# Patient Record
Sex: Female | Born: 1935 | Race: White | Hispanic: No | Smoking: Former smoker
Health system: Southern US, Community
[De-identification: ages and names within clinical notes are randomized; demographics above are authoritative.]

## PROBLEM LIST (undated history)

## (undated) DIAGNOSIS — E039 Hypothyroidism, unspecified: Secondary | ICD-10-CM

## (undated) DIAGNOSIS — H269 Unspecified cataract: Secondary | ICD-10-CM

## (undated) DIAGNOSIS — Z8719 Personal history of other diseases of the digestive system: Secondary | ICD-10-CM

## (undated) DIAGNOSIS — R0600 Dyspnea, unspecified: Secondary | ICD-10-CM

## (undated) DIAGNOSIS — K219 Gastro-esophageal reflux disease without esophagitis: Secondary | ICD-10-CM

## (undated) DIAGNOSIS — I10 Essential (primary) hypertension: Secondary | ICD-10-CM

## (undated) DIAGNOSIS — J342 Deviated nasal septum: Secondary | ICD-10-CM

## (undated) DIAGNOSIS — J449 Chronic obstructive pulmonary disease, unspecified: Secondary | ICD-10-CM

## (undated) DIAGNOSIS — R011 Cardiac murmur, unspecified: Secondary | ICD-10-CM

## (undated) DIAGNOSIS — D219 Benign neoplasm of connective and other soft tissue, unspecified: Secondary | ICD-10-CM

## (undated) DIAGNOSIS — D649 Anemia, unspecified: Secondary | ICD-10-CM

## (undated) DIAGNOSIS — I2510 Atherosclerotic heart disease of native coronary artery without angina pectoris: Secondary | ICD-10-CM

## (undated) DIAGNOSIS — F419 Anxiety disorder, unspecified: Secondary | ICD-10-CM

## (undated) DIAGNOSIS — N189 Chronic kidney disease, unspecified: Secondary | ICD-10-CM

## (undated) DIAGNOSIS — K222 Esophageal obstruction: Secondary | ICD-10-CM

## (undated) DIAGNOSIS — F319 Bipolar disorder, unspecified: Secondary | ICD-10-CM

## (undated) DIAGNOSIS — K2210 Ulcer of esophagus without bleeding: Secondary | ICD-10-CM

## (undated) DIAGNOSIS — I059 Rheumatic mitral valve disease, unspecified: Secondary | ICD-10-CM

## (undated) DIAGNOSIS — J302 Other seasonal allergic rhinitis: Secondary | ICD-10-CM

## (undated) DIAGNOSIS — M1990 Unspecified osteoarthritis, unspecified site: Secondary | ICD-10-CM

## (undated) DIAGNOSIS — E7800 Pure hypercholesterolemia, unspecified: Secondary | ICD-10-CM

## (undated) DIAGNOSIS — N251 Nephrogenic diabetes insipidus: Secondary | ICD-10-CM

## (undated) DIAGNOSIS — N281 Cyst of kidney, acquired: Secondary | ICD-10-CM

## (undated) HISTORY — PX: NOSE SURGERY: SHX723

## (undated) HISTORY — PX: ESOPHAGEAL DILATION: SHX303

## (undated) HISTORY — PX: THYROIDECTOMY: SHX17

## (undated) HISTORY — PX: INFECTED SKIN DEBRIDEMENT: SHX678

## (undated) HISTORY — DX: Chronic kidney disease, unspecified: N18.9

## (undated) HISTORY — DX: Nephrogenic diabetes insipidus: N25.1

## (undated) HISTORY — PX: CARDIAC CATHETERIZATION: SHX172

## (undated) HISTORY — PX: JOINT REPLACEMENT: SHX530

## (undated) HISTORY — PX: CHOLECYSTECTOMY: SHX55

## (undated) HISTORY — PX: OTHER SURGICAL HISTORY: SHX169

## (undated) HISTORY — DX: Deviated nasal septum: J34.2

## (undated) HISTORY — PX: APPENDECTOMY: SHX54

## (undated) HISTORY — DX: Cyst of kidney, acquired: N28.1

## (undated) HISTORY — PX: TOTAL THYROIDECTOMY: SHX2547

## (undated) NOTE — *Deleted (*Deleted)
TREATMENT   Ther-ex  NuStep L2 x 5 minutes for warm-up (unbilled); Sit to stand without upper extremity support x10; Seated clams with manual resistance from therapist; Seated adductor squeezes with manual resistance from therapist;   Neuromuscular Re-education  Performed BERG (45/56) and TUG (18.0s) with patient; VOR x 1 horizontal in sitting 3 x 60s (4/10 dizziness first rep but no dizziness during subsequent reps); NBOS eyes open/close x 30s each; NBOS horizontal and vertical head turns x 30s each; NBOS head with shoulder turns (turn from waist) x 30s; Airex alternating 6" step taps x 10 BLE; Airex semitandem balance with front foot on 6" step and rear foot on Airex pad x 30s each; Airex NBOS head with shoulder turns (turn from waist) x 30s;   Pt educated throughout session about proper posture and technique with exercises. Improved exercise technique, movement at target joints, use of target muscles after min to mod verbal, visual, tactile cues.    Patient demonstrates excellent motivation during session.  Performed BERG with patient as well as TUG and pt scored 45/56 and 18.0s respectively indicating increased fall risk.  Initiated VOR x1 horizontal with patient however she struggles to perform correctly and complains of of increase in neck pain during exercise.  She struggles with balance on unstable Airex pad.  Deferred initiation of HEP until next session.  Patient encouraged to follow-up as scheduled. Pt will benefit from PT services to address deficits in strength, balance, and mobility in order to return to full function at home.

## (undated) NOTE — *Deleted (*Deleted)
TREATMENT   Ther-ex  NuStep L2 x 5 minutes for warm-up (unbilled); Sit to stand without upper extremity support x10; Seated clams with manual resistance from therapist; Seated adductor squeezes with manual resistance from therapist;   Neuromuscular Re-education  Performed BERG (45/56) and TUG (18.0s) with patient; VOR x 1 horizontal in sitting 3 x 60s (4/10 dizziness first rep but no dizziness during subsequent reps); NBOS eyes open/close x 30s each; NBOS horizontal and vertical head turns x 30s each; NBOS head with shoulder turns (turn from waist) x 30s; Airex alternating 6" step taps x 10 BLE; Airex semitandem balance with front foot on 6" step and rear foot on Airex pad x 30s each; Airex NBOS head with shoulder turns (turn from waist) x 30s;   Pt educated throughout session about proper posture and technique with exercises. Improved exercise technique, movement at target joints, use of target muscles after min to mod verbal, visual, tactile cues.    Patient demonstrates excellent motivation during session.  Performed BERG with patient as well as TUG and pt scored 45/56 and 18.0s respectively indicating increased fall risk.  Initiated VOR x1 horizontal with patient however she struggles to perform correctly and complains of of increase in neck pain during exercise.  She struggles with balance on unstable Airex pad.  Deferred initiation of HEP until next session.  Patient encouraged to follow-up as scheduled. Pt will benefit from PT services to address deficits in strength, balance, and mobility in order to return to full function at home.    

---

## 2004-10-15 ENCOUNTER — Ambulatory Visit: Payer: Self-pay

## 2004-10-22 ENCOUNTER — Ambulatory Visit: Payer: Self-pay | Admitting: Family Medicine

## 2005-10-17 ENCOUNTER — Emergency Department: Payer: Self-pay | Admitting: Emergency Medicine

## 2006-03-18 ENCOUNTER — Ambulatory Visit: Payer: Self-pay | Admitting: Family Medicine

## 2006-04-07 ENCOUNTER — Ambulatory Visit: Payer: Self-pay | Admitting: Gastroenterology

## 2006-09-14 ENCOUNTER — Other Ambulatory Visit: Payer: Self-pay

## 2006-09-14 ENCOUNTER — Ambulatory Visit: Payer: Self-pay | Admitting: General Practice

## 2006-09-30 ENCOUNTER — Inpatient Hospital Stay: Payer: Self-pay | Admitting: General Practice

## 2006-10-06 ENCOUNTER — Encounter: Payer: Self-pay | Admitting: Internal Medicine

## 2006-10-21 ENCOUNTER — Emergency Department: Payer: Self-pay | Admitting: Unknown Physician Specialty

## 2006-10-21 ENCOUNTER — Other Ambulatory Visit: Payer: Self-pay

## 2006-11-16 ENCOUNTER — Other Ambulatory Visit: Payer: Self-pay

## 2006-11-16 ENCOUNTER — Emergency Department: Payer: Self-pay | Admitting: Emergency Medicine

## 2007-03-30 ENCOUNTER — Ambulatory Visit: Payer: Self-pay | Admitting: Family Medicine

## 2008-07-15 ENCOUNTER — Emergency Department: Payer: Self-pay | Admitting: Emergency Medicine

## 2008-08-25 ENCOUNTER — Ambulatory Visit: Payer: Self-pay | Admitting: Internal Medicine

## 2008-08-28 ENCOUNTER — Ambulatory Visit: Payer: Self-pay | Admitting: Internal Medicine

## 2008-09-27 ENCOUNTER — Inpatient Hospital Stay: Payer: Self-pay | Admitting: Psychiatry

## 2008-11-13 ENCOUNTER — Ambulatory Visit: Payer: Self-pay | Admitting: Otolaryngology

## 2009-01-20 ENCOUNTER — Inpatient Hospital Stay: Payer: Self-pay | Admitting: Specialist

## 2009-02-03 ENCOUNTER — Inpatient Hospital Stay: Payer: Self-pay | Admitting: Unknown Physician Specialty

## 2010-01-28 ENCOUNTER — Ambulatory Visit: Payer: Self-pay | Admitting: Gastroenterology

## 2010-05-10 ENCOUNTER — Ambulatory Visit: Payer: Self-pay | Admitting: Nephrology

## 2010-09-12 ENCOUNTER — Ambulatory Visit: Payer: Self-pay | Admitting: Nephrology

## 2010-11-13 ENCOUNTER — Emergency Department: Payer: Self-pay | Admitting: Emergency Medicine

## 2011-04-29 ENCOUNTER — Ambulatory Visit: Payer: Self-pay | Admitting: Urology

## 2012-06-18 DIAGNOSIS — N281 Cyst of kidney, acquired: Secondary | ICD-10-CM | POA: Insufficient documentation

## 2012-08-13 ENCOUNTER — Ambulatory Visit: Payer: Self-pay | Admitting: Nephrology

## 2012-11-02 ENCOUNTER — Ambulatory Visit: Payer: Self-pay | Admitting: Urology

## 2013-01-20 ENCOUNTER — Ambulatory Visit: Payer: Self-pay | Admitting: Gastroenterology

## 2013-04-26 ENCOUNTER — Ambulatory Visit: Payer: Self-pay | Admitting: Ophthalmology

## 2013-08-03 ENCOUNTER — Other Ambulatory Visit: Payer: Self-pay | Admitting: Family Medicine

## 2013-08-04 ENCOUNTER — Inpatient Hospital Stay: Payer: Self-pay | Admitting: Family Medicine

## 2013-08-04 LAB — CBC WITH DIFFERENTIAL/PLATELET
Basophil #: 0 10*3/uL (ref 0.0–0.1)
Basophil %: 0.4 %
EOS ABS: 0 10*3/uL (ref 0.0–0.7)
Eosinophil %: 0.2 %
HCT: 28.5 % — ABNORMAL LOW (ref 35.0–47.0)
HGB: 9.3 g/dL — ABNORMAL LOW (ref 12.0–16.0)
Lymphocyte #: 1.4 10*3/uL (ref 1.0–3.6)
Lymphocyte %: 11.6 %
MCH: 25.1 pg — AB (ref 26.0–34.0)
MCHC: 32.8 g/dL (ref 32.0–36.0)
MCV: 77 fL — ABNORMAL LOW (ref 80–100)
Monocyte #: 2 x10 3/mm — ABNORMAL HIGH (ref 0.2–0.9)
Monocyte %: 16.5 %
Neutrophil #: 8.7 10*3/uL — ABNORMAL HIGH (ref 1.4–6.5)
Neutrophil %: 71.3 %
PLATELETS: 177 10*3/uL (ref 150–440)
RBC: 3.72 10*6/uL — AB (ref 3.80–5.20)
RDW: 14.8 % — AB (ref 11.5–14.5)
WBC: 12.3 10*3/uL — ABNORMAL HIGH (ref 3.6–11.0)

## 2013-08-04 LAB — URINALYSIS, COMPLETE
Bilirubin,UR: NEGATIVE
GLUCOSE, UR: NEGATIVE mg/dL (ref 0–75)
Ketone: NEGATIVE
Nitrite: POSITIVE
PROTEIN: NEGATIVE
Ph: 5 (ref 4.5–8.0)
Specific Gravity: 1.008 (ref 1.003–1.030)
Squamous Epithelial: 1

## 2013-08-04 LAB — COMPREHENSIVE METABOLIC PANEL
ALT: 9 U/L — AB (ref 12–78)
ANION GAP: 8 (ref 7–16)
Albumin: 2.7 g/dL — ABNORMAL LOW (ref 3.4–5.0)
Alkaline Phosphatase: 65 U/L
BUN: 15 mg/dL (ref 7–18)
Bilirubin,Total: 0.4 mg/dL (ref 0.2–1.0)
CHLORIDE: 107 mmol/L (ref 98–107)
CO2: 21 mmol/L (ref 21–32)
Calcium, Total: 8.3 mg/dL — ABNORMAL LOW (ref 8.5–10.1)
Creatinine: 1.98 mg/dL — ABNORMAL HIGH (ref 0.60–1.30)
EGFR (African American): 28 — ABNORMAL LOW
EGFR (Non-African Amer.): 24 — ABNORMAL LOW
GLUCOSE: 98 mg/dL (ref 65–99)
Osmolality: 273 (ref 275–301)
Potassium: 3.7 mmol/L (ref 3.5–5.1)
SGOT(AST): 11 U/L — ABNORMAL LOW (ref 15–37)
Sodium: 136 mmol/L (ref 136–145)
Total Protein: 6.2 g/dL — ABNORMAL LOW (ref 6.4–8.2)

## 2013-08-04 LAB — LIPASE, BLOOD: Lipase: 158 U/L (ref 73–393)

## 2013-08-04 LAB — TROPONIN I: Troponin-I: <0.02 ng/mL

## 2013-08-04 LAB — PRO B NATRIURETIC PEPTIDE: B-Type Natriuretic Peptide: 3495 pg/mL — ABNORMAL HIGH (ref 0–450)

## 2013-08-05 LAB — BASIC METABOLIC PANEL
Anion Gap: 8 (ref 7–16)
BUN: 16 mg/dL (ref 7–18)
CALCIUM: 8.3 mg/dL — AB (ref 8.5–10.1)
CREATININE: 1.84 mg/dL — AB (ref 0.60–1.30)
Chloride: 110 mmol/L — ABNORMAL HIGH (ref 98–107)
Co2: 22 mmol/L (ref 21–32)
EGFR (Non-African Amer.): 26 — ABNORMAL LOW
GFR CALC AF AMER: 30 — AB
Glucose: 87 mg/dL (ref 65–99)
Osmolality: 280 (ref 275–301)
POTASSIUM: 3.4 mmol/L — AB (ref 3.5–5.1)
Sodium: 140 mmol/L (ref 136–145)

## 2013-08-05 LAB — CBC WITH DIFFERENTIAL/PLATELET
BASOS PCT: 0.5 %
Basophil #: 0 10*3/uL (ref 0.0–0.1)
Eosinophil #: 0.1 10*3/uL (ref 0.0–0.7)
Eosinophil %: 0.6 %
HCT: 26.4 % — ABNORMAL LOW (ref 35.0–47.0)
HGB: 9 g/dL — ABNORMAL LOW (ref 12.0–16.0)
LYMPHS ABS: 1.3 10*3/uL (ref 1.0–3.6)
LYMPHS PCT: 13.2 %
MCH: 26.2 pg (ref 26.0–34.0)
MCHC: 34.3 g/dL (ref 32.0–36.0)
MCV: 76 fL — AB (ref 80–100)
MONO ABS: 1.5 x10 3/mm — AB (ref 0.2–0.9)
Monocyte %: 15.6 %
NEUTROS ABS: 6.7 10*3/uL — AB (ref 1.4–6.5)
Neutrophil %: 70.1 %
PLATELETS: 148 10*3/uL — AB (ref 150–440)
RBC: 3.46 10*6/uL — ABNORMAL LOW (ref 3.80–5.20)
RDW: 15.1 % — ABNORMAL HIGH (ref 11.5–14.5)
WBC: 9.6 10*3/uL (ref 3.6–11.0)

## 2013-08-05 LAB — MAGNESIUM: Magnesium: 1.6 mg/dL — ABNORMAL LOW

## 2013-08-06 LAB — CBC WITH DIFFERENTIAL/PLATELET
Basophil #: 0 10*3/uL (ref 0.0–0.1)
Basophil %: 0.6 %
Eosinophil #: 0.1 10*3/uL (ref 0.0–0.7)
Eosinophil %: 1.8 %
HCT: 25.6 % — ABNORMAL LOW (ref 35.0–47.0)
HGB: 8.4 g/dL — ABNORMAL LOW (ref 12.0–16.0)
LYMPHS PCT: 22.4 %
Lymphocyte #: 1.3 10*3/uL (ref 1.0–3.6)
MCH: 25.3 pg — ABNORMAL LOW (ref 26.0–34.0)
MCHC: 33 g/dL (ref 32.0–36.0)
MCV: 77 fL — AB (ref 80–100)
MONO ABS: 0.7 x10 3/mm (ref 0.2–0.9)
Monocyte %: 12.3 %
Neutrophil #: 3.7 10*3/uL (ref 1.4–6.5)
Neutrophil %: 62.9 %
Platelet: 160 10*3/uL (ref 150–440)
RBC: 3.34 10*6/uL — ABNORMAL LOW (ref 3.80–5.20)
RDW: 15.2 % — ABNORMAL HIGH (ref 11.5–14.5)
WBC: 5.9 10*3/uL (ref 3.6–11.0)

## 2013-08-06 LAB — BASIC METABOLIC PANEL
Anion Gap: 5 — ABNORMAL LOW (ref 7–16)
BUN: 12 mg/dL (ref 7–18)
CALCIUM: 8.4 mg/dL — AB (ref 8.5–10.1)
CHLORIDE: 115 mmol/L — AB (ref 98–107)
Co2: 24 mmol/L (ref 21–32)
Creatinine: 1.52 mg/dL — ABNORMAL HIGH (ref 0.60–1.30)
GFR CALC AF AMER: 38 — AB
GFR CALC NON AF AMER: 33 — AB
Glucose: 89 mg/dL (ref 65–99)
Osmolality: 286 (ref 275–301)
POTASSIUM: 3.6 mmol/L (ref 3.5–5.1)
SODIUM: 144 mmol/L (ref 136–145)

## 2013-08-06 LAB — URINE CULTURE

## 2013-08-07 LAB — CBC WITH DIFFERENTIAL/PLATELET
BASOS ABS: 0 10*3/uL (ref 0.0–0.1)
BASOS PCT: 0.7 %
Eosinophil #: 0.2 10*3/uL (ref 0.0–0.7)
Eosinophil %: 3.6 %
HCT: 26 % — ABNORMAL LOW (ref 35.0–47.0)
HGB: 8.9 g/dL — AB (ref 12.0–16.0)
Lymphocyte #: 1.7 10*3/uL (ref 1.0–3.6)
Lymphocyte %: 30.1 %
MCH: 26.1 pg (ref 26.0–34.0)
MCHC: 34.2 g/dL (ref 32.0–36.0)
MCV: 76 fL — ABNORMAL LOW (ref 80–100)
MONOS PCT: 12.1 %
Monocyte #: 0.7 x10 3/mm (ref 0.2–0.9)
NEUTROS ABS: 2.9 10*3/uL (ref 1.4–6.5)
Neutrophil %: 53.5 %
PLATELETS: 202 10*3/uL (ref 150–440)
RBC: 3.41 10*6/uL — ABNORMAL LOW (ref 3.80–5.20)
RDW: 15.1 % — ABNORMAL HIGH (ref 11.5–14.5)
WBC: 5.5 10*3/uL (ref 3.6–11.0)

## 2013-08-07 LAB — BASIC METABOLIC PANEL
Anion Gap: 5 — ABNORMAL LOW (ref 7–16)
BUN: 8 mg/dL (ref 7–18)
CALCIUM: 8.4 mg/dL — AB (ref 8.5–10.1)
CO2: 22 mmol/L (ref 21–32)
CREATININE: 1.47 mg/dL — AB (ref 0.60–1.30)
Chloride: 117 mmol/L — ABNORMAL HIGH (ref 98–107)
EGFR (African American): 39 — ABNORMAL LOW
GFR CALC NON AF AMER: 34 — AB
Glucose: 83 mg/dL (ref 65–99)
OSMOLALITY: 284 (ref 275–301)
POTASSIUM: 3.8 mmol/L (ref 3.5–5.1)
SODIUM: 144 mmol/L (ref 136–145)

## 2013-08-08 LAB — CULTURE, BLOOD (SINGLE)

## 2013-08-09 LAB — BASIC METABOLIC PANEL
ANION GAP: 7 (ref 7–16)
BUN: 9 mg/dL (ref 7–18)
Calcium, Total: 8.5 mg/dL (ref 8.5–10.1)
Chloride: 114 mmol/L — ABNORMAL HIGH (ref 98–107)
Co2: 24 mmol/L (ref 21–32)
Creatinine: 1.36 mg/dL — ABNORMAL HIGH (ref 0.60–1.30)
EGFR (Non-African Amer.): 37 — ABNORMAL LOW
GFR CALC AF AMER: 43 — AB
GLUCOSE: 72 mg/dL (ref 65–99)
Osmolality: 286 (ref 275–301)
Potassium: 3.7 mmol/L (ref 3.5–5.1)
Sodium: 145 mmol/L (ref 136–145)

## 2013-08-09 LAB — CULTURE, BLOOD (SINGLE)

## 2013-12-22 ENCOUNTER — Ambulatory Visit: Payer: Self-pay | Admitting: Urology

## 2014-03-10 ENCOUNTER — Emergency Department: Payer: Self-pay | Admitting: Emergency Medicine

## 2014-03-10 LAB — CBC WITH DIFFERENTIAL/PLATELET
BASOS ABS: 0.1 10*3/uL (ref 0.0–0.1)
Basophil %: 1 %
Eosinophil #: 0.3 10*3/uL (ref 0.0–0.7)
Eosinophil %: 3.1 %
HCT: 39.9 % (ref 35.0–47.0)
HGB: 13 g/dL (ref 12.0–16.0)
LYMPHS ABS: 2.2 10*3/uL (ref 1.0–3.6)
Lymphocyte %: 25.8 %
MCH: 26.3 pg (ref 26.0–34.0)
MCHC: 32.7 g/dL (ref 32.0–36.0)
MCV: 80 fL (ref 80–100)
Monocyte #: 0.9 x10 3/mm (ref 0.2–0.9)
Monocyte %: 10.7 %
NEUTROS ABS: 5 10*3/uL (ref 1.4–6.5)
NEUTROS PCT: 59.4 %
Platelet: 184 10*3/uL (ref 150–440)
RBC: 4.96 10*6/uL (ref 3.80–5.20)
RDW: 14.5 % (ref 11.5–14.5)
WBC: 8.4 10*3/uL (ref 3.6–11.0)

## 2014-03-10 LAB — BASIC METABOLIC PANEL
Anion Gap: 5 — ABNORMAL LOW (ref 7–16)
BUN: 28 mg/dL — AB (ref 7–18)
CALCIUM: 9.9 mg/dL (ref 8.5–10.1)
Chloride: 107 mmol/L (ref 98–107)
Co2: 28 mmol/L (ref 21–32)
Creatinine: 1.57 mg/dL — ABNORMAL HIGH (ref 0.60–1.30)
GLUCOSE: 91 mg/dL (ref 65–99)
Osmolality: 284 (ref 275–301)
POTASSIUM: 4.2 mmol/L (ref 3.5–5.1)
Sodium: 140 mmol/L (ref 136–145)

## 2014-03-10 LAB — URINALYSIS, COMPLETE
BACTERIA: NONE SEEN
BILIRUBIN, UR: NEGATIVE
GLUCOSE, UR: NEGATIVE mg/dL (ref 0–75)
Ketone: NEGATIVE
NITRITE: NEGATIVE
PH: 5 (ref 4.5–8.0)
Protein: NEGATIVE
RBC,UR: NONE SEEN /HPF (ref 0–5)
Specific Gravity: 1.004 (ref 1.003–1.030)
Squamous Epithelial: <1

## 2014-03-10 LAB — TROPONIN I: Troponin-I: <0.02 ng/mL

## 2014-03-29 DIAGNOSIS — I10 Essential (primary) hypertension: Secondary | ICD-10-CM | POA: Insufficient documentation

## 2014-03-29 DIAGNOSIS — F319 Bipolar disorder, unspecified: Secondary | ICD-10-CM | POA: Insufficient documentation

## 2014-03-29 DIAGNOSIS — I340 Nonrheumatic mitral (valve) insufficiency: Secondary | ICD-10-CM | POA: Insufficient documentation

## 2014-03-29 DIAGNOSIS — E039 Hypothyroidism, unspecified: Secondary | ICD-10-CM | POA: Insufficient documentation

## 2014-03-29 DIAGNOSIS — R001 Bradycardia, unspecified: Secondary | ICD-10-CM | POA: Insufficient documentation

## 2014-07-21 ENCOUNTER — Emergency Department: Payer: Self-pay | Admitting: Emergency Medicine

## 2014-09-09 NOTE — H&P (Signed)
PATIENT NAME:  Stephanie Anthony, Stephanie Anthony MR#:  [id number] DATE OF BIRTH:  September 05, 1933  DATE OF ADMISSION:  08/04/2013  PRIMARY CARE PHYSICIAN: [name], MD REFERRING PHYSICIAN: Dr. Mariea Clonts  CHIEF COMPLAINT: Fever and chills, anorexia for 2 weeks.   HISTORY OF PRESENT ILLNESS: A 81 year old Caucasian female with a history of hypertension, anemia, MI, depression, who presented in the ED with above chief complaints. The patient is alert, awake, oriented, in no acute distress. The patient said that she has had a fever, chills, anorexia for the past 2 weeks. She has poor oral intake, nausea, and vomiting once. The patient has generalized weakness. She also complains of chest pain, a little coughing. She has dark urine with back pain and flank pain.   She was treated with Zithromax by PCP without relief. In the ED, urinalysis shows UTI, suspect pyelonephritis. In addition, the patient's blood pressure was low at 90s. Was given normal saline bolus and IV antibiotics.   PAST MEDICAL HISTORY: Hypertension, anemia, MI, depression, arthritis, heart murmur, sleep apnea.   PAST SURGICAL HISTORY: Right knee surgery, cholecystectomy, thyroidectomy.   SOCIAL HISTORY: No smoking or drinking or illicit drugs.   FAMILY HISTORY: Hypertension, diabetes, heart attack, stroke in her family.   ALLERGIES: COBAN, PREDNISONE, VALIUM, ANIMAL DANDER, DOGS, DUST, SMOKE.   HOME MEDICATIONS:  1.  Alprazolam 1 mg one tablet once a day.  2.  Cardizem ER 180 mg p.o. once a day.  3.  Enalapril 2.5 mg p.o. once a day.  4.  Lamotrigine 100 mg p.o. at bedtime.  5.  Levothyroxine 88 mcg p.o. at bedtime.  6.  Lovastatin 20 mg p.o. at bedtime.  7.  Vitamin B12 at 1000 mcg p.o. once a day.  8.  Vitamin D3 at 1000 international units p.o. tablets once a day.   REVIEW OF SYSTEMS:  CONSTITUTIONAL: Fever, chills, headache, dizziness and generalized weakness.  EYES: No double vision or blurred vision.  ENT: No postnasal drip, slurred  speech or dysphagia. CARDIOVASCULAR: Has chest pain by coughing. No palpitations, orthopnea, or nocturnal dyspnea. No leg edema.  PULMONARY: Has a cough, sputum, and mild shortness of breath but no hemoptysis.  GASTROINTESTINAL: Has abdominal pain, back pain, nausea and vomiting. No diarrhea. No melena or bloody stool.  GENITOURINARY: No dysuria, hematuria, but has dark urine and urinary frequency. No incontinence.  SKIN: No rash or jaundice.  NEUROLOGY: No syncope, loss of consciousness or seizure.  HEMATOLOGY: No easy bruising or bleeding.  ENDOCRINE: No polyuria, polydipsia, heat or cold intolerance.   PHYSICAL EXAMINATION:  VITAL SIGNS: Temperature 98.9, blood pressure 92/46, pulse 53, oxygen saturation 98% on room air.  GENERAL: Alert, awake, oriented, in no acute distress.  HEENT: Pupils round, equal and reactive to light and accommodation. Moist oral mucosa. Clear oropharynx.  NECK: Supple. No JVD or carotid bruit. No lymphadenopathy. No thyromegaly.  CARDIOVASCULAR: S1, S2, regular rate, rhythm. No murmurs, gallops.  PULMONARY: Bilateral air entry. No wheezing or rales. No use of accessory muscles to breathe.  ABDOMEN: Soft. No distention or tenderness. No organomegaly. Bowel sounds are present EXTREMITIES: No edema, clubbing or cyanosis. No calf tenderness. Bilateral pedal pulses present.  SKIN: No rash or jaundice.  NEUROLOGIC: A and O x3. No focal deficit. Power 5/5. Sensation intact.   DIAGNOSTIC DATA: Chest x-ray showed chronic lung changes without acute pulmonary process.   Urinalysis showed WBC 235, nitrite positive. Troponin less than 0.02. WBC 12.3, hemoglobin 9.3, platelets 177. Glucose 98, BUN 15, creatinine 1.98.  Electrolytes normal. Lipase 158. BNP 3495. Blood culture yesterday, 2 sets, was negative.   EKG showed sinus bradycardia at 56 bpm.   IMPRESSIONS:  1.  Urinary tract infection, possible pyelonephritis.  2.  Sepsis.  3.  Acute renal failure.  4.  Anemia.   5.  Elevated BNP results, signs of congestive heart failure.   PLAN OF TREATMENT:  1.  The patient will be admitted to medical floor. We will start Rocephin and follow up blood culture, urine culture, CBC.  2.  Acute renal failure. We will start normal saline IV, follow up BMP.  3.  We will hold enalapril and Cardizem due to low blood pressure.   I discussed the patient's condition and plan of treatment with the patient and the patient's son.   CODE STATUS: THE PATIENT WANTS FULL CODE.   TIME SPENT: About 52 minutes.   ____________________________ Demetrios Loll, MD qc:np D: 08/04/2013 21:03:19 ET T: 08/04/2013 22:08:56 ET JOB#: [id number]  cc: Demetrios Loll, MD, <Dictator> Demetrios Loll MD ELECTRONICALLY SIGNED 08/05/2013 17:20

## 2014-09-09 NOTE — Consult Note (Signed)
PATIENT NAME:  Stephanie Anthony, Stephanie Anthony MR#:  [id number] DATE OF BIRTH:  [birth year]  DATE OF CONSULTATION:  08/06/2013  CONSULTING PHYSICIAN:  Jerrol Banana. Burt Knack, MD  CHIEF COMPLAINT: Rectal prolapse.   HISTORY OF PRESENT ILLNESS: I was asked to see this patient for rectal prolapse. The patient describes occasional and intermittent pain and prolapse that has been going on for many years. She is a 81 year old female patient admitted with a diagnosis of urinary tract infection and possible pyelonephritis and is currently on antibiotics.   PAST MEDICAL HISTORY: Hypertension, anemia, coronary artery disease, depression.   PAST SURGICAL HISTORY: Knee surgery, cholecystectomy.   SOCIAL HISTORY: The patient does not smoke or drink.   REVIEW OF SYSTEMS:  A 10 system review is performed and negative with the exception of that mentioned in the history of present illness.   ALLERGIES: MULTIPLE, SEE CHART.   MEDICATIONS: Multiple, see med reconciliation.   PHYSICAL EXAMINATION: GENERAL: Healthy, comfortable-appearing female patient.  VITAL SIGNS: Temperature 97.6, pulse 59, respirations 18, blood pressure 126/59, 99% room air sat.  HEENT: No scleral icterus.  NECK: No palpable neck nodes.  CHEST: Clear to auscultation.  CARDIAC: Regular rate and rhythm.  ABDOMEN: Soft, nontender. Scars are noted.  EXTREMITIES: Without edema.  NEUROLOGIC: Grossly intact.  INTEGUMENT: No jaundice.  RECTAL: Shows prominent hemorrhoidal tissue but no obvious rectal prolapse. Rectal exam is performed easily, very patulous rectum with poor rectal tone and internal hemorrhoids. No stool for testing.   LABORATORY DATA:  Hemoglobin and hematocrit is 8.4 and 25.6, platelet count of 160. White blood cell count 5.9. Creatinine of 1.52, otherwise electrolytes are grossly within normal limits.   ASSESSMENT AND PLAN: This is a patient with a history of chronic long-standing intermittent rectal prolapse. Currently, there is no acute  process. I discussed with the patient the possibility and option of definitive care being performed by a trained colorectal surgeon at either [facility] or [facility], but there are no acute surgical needs at this point. We will sign off.    ____________________________ Jerrol Banana. Burt Knack, MD rec:dp D: 08/06/2013 12:28:11 ET T: 08/06/2013 12:58:21 ET JOB#: [id number]  cc: Jerrol Banana. Burt Knack, MD, <Dictator> Florene Glen MD ELECTRONICALLY SIGNED 08/06/2013 18:54

## 2014-09-09 NOTE — Consult Note (Signed)
Brief Consult Note: Diagnosis: intermittent chronic rectal prolapse.   Patient was seen by consultant.   Consult note dictated.   Recommend to proceed with surgery or procedure.   Comments: Chronic longstanding rectal prolapse without acute process. Rec pt see colorectal surgery service at [facility] or [facility] for definitive repair as outpt. No acute surgical needs.  Electronic Signatures: Florene Glen (MD)  (Signed 21-Mar-15 12:24)  Authored: Brief Consult Note   Last Updated: 21-Mar-15 12:24 by Florene Glen (MD)

## 2014-09-09 NOTE — Discharge Summary (Signed)
PATIENT NAME:  Stephanie Anthony, Stephanie Anthony MR#:  [id number] DATE OF BIRTH:  1934/04/02  DATE OF ADMISSION:  08/04/2013 DATE OF DISCHARGE:  08/09/2013  DISCHARGE DIAGNOSES: 1.  Urinary tract infection concerning for possible pyelonephritis that is resolving.  2.  Acute renal failure on top of chronic renal failure that is resolved.  3.  Dehydration that is resolved.   DISCHARGE MEDICATIONS: 1.  Enalapril 2.5 mg p.o. daily.  2.  Levothyroxine 88 mcg p.o. daily.  3.  Vitamin B12 1000 mcg p.o. daily.  4.  Lamotrigine 100 mg p.o. at bedtime.  5.  Alprazolam extended-release 1 mg p.o. daily as needed.  6.  Vitamin D3 1000 international units p.o. daily.  7.  Lovastatin 20 mg p.o. at bedtime. 8.  Acetaminophen 325 mg 2 tabs q. 4 hours as needed for pain and fever.  9.  Amlodipine 10 mg p.o. daily.  10.  Ciprofloxacin 500 mg p.o. b.i.d. x2 more days.   CONSULTS: None.   PROCEDURES: None.   PERTINENT LABS AND STUDIES: On day of discharge, sodium 145, potassium 3.7, creatinine 1.36, glucose 72. Blood culture showed no growth. Urine culture grew out Klebsiella sensitive to Cipro.   BRIEF HOSPITAL COURSE:  1.  UTI with concerns for pyelonephritis. The patient initially came in with systemic symptoms concerning for SIRS and sepsis and pyelonephritis. Urinalysis was positive. Urine culture was pending at the time. She was placed on ceftriaxone and IV fluids. In a matter of 48 hours, she responded and improved in appearance. Urine culture grew out Klebsiella sensitive to Cipro. She was transitioned over to oral Cipro. Acute renal failure on top of chronic renal failure started to improve with hydration and treatment of her infection. Initially thought to be septic but that did resolve and was ruled out.  2.  Other chronic issues remained stable. Continued on home regimen, except for the diltiazem. She was found to be bradycardic. Therefore, she was switched over to amlodipine, which has done better for her. She was  found to be weak. Therefore, physical therapy evaluated her and she will go home with home health physical therapy.   DISPOSITION: She is in stable condition and will be discharged to home with home health PT and follow up with Dr. Netty Starring within 10 days.   ____________________________ [name], MD kl:sb D: 08/09/2013 07:59:27 ET T: 08/09/2013 09:40:49 ET JOB#: [id number]  cc: [name], MD, <Dictator> [name] MD ELECTRONICALLY SIGNED 08/22/2013 10:35

## 2014-09-11 ENCOUNTER — Emergency Department
Admit: 2014-09-11 | Disposition: A | Discharge status: Home / Self Care | Payer: Self-pay | Admitting: Emergency Medicine

## 2014-09-11 LAB — COMPREHENSIVE METABOLIC PANEL
ALT: 13 U/L — AB
AST: 21 U/L
Albumin: 4.1 g/dL
Alkaline Phosphatase: 47 U/L
Anion Gap: 7 (ref 7–16)
BUN: 22 mg/dL — ABNORMAL HIGH
Bilirubin,Total: 0.5 mg/dL
CALCIUM: 9.4 mg/dL
CHLORIDE: 111 mmol/L
CO2: 25 mmol/L
Creatinine: 1.53 mg/dL — ABNORMAL HIGH
EGFR (Non-African Amer.): 32 — ABNORMAL LOW
GFR CALC AF AMER: 37 — AB
Glucose: 97 mg/dL
POTASSIUM: 4.8 mmol/L
SODIUM: 143 mmol/L
Total Protein: 7.2 g/dL

## 2014-09-11 LAB — CBC
HCT: 38.8 % (ref 35.0–47.0)
HGB: 12.8 g/dL (ref 12.0–16.0)
MCH: 26.3 pg (ref 26.0–34.0)
MCHC: 33.1 g/dL (ref 32.0–36.0)
MCV: 80 fL (ref 80–100)
Platelet: 184 10*3/uL (ref 150–440)
RBC: 4.88 10*6/uL (ref 3.80–5.20)
RDW: 14.2 % (ref 11.5–14.5)
WBC: 7.2 10*3/uL (ref 3.6–11.0)

## 2014-09-11 LAB — TSH: Thyroid Stimulating Horm: 4.106 u[IU]/mL

## 2014-09-11 LAB — TROPONIN I

## 2014-09-11 LAB — URINALYSIS, COMPLETE
BILIRUBIN, UR: NEGATIVE
Bacteria: NONE SEEN
Blood: NEGATIVE
Glucose,UR: NEGATIVE mg/dL (ref 0–75)
Ketone: NEGATIVE
Leukocyte Esterase: NEGATIVE
Nitrite: NEGATIVE
PROTEIN: NEGATIVE
Ph: 7 (ref 4.5–8.0)
SQUAMOUS EPITHELIAL: NONE SEEN
Specific Gravity: 1.004 (ref 1.003–1.030)

## 2014-09-11 LAB — ACETAMINOPHEN LEVEL: Acetaminophen: <10 ug/mL

## 2014-09-11 LAB — DRUG SCREEN, URINE
Amphetamines, Ur Screen: NEGATIVE
BENZODIAZEPINE, UR SCRN: POSITIVE
Barbiturates, Ur Screen: NEGATIVE
COCAINE METABOLITE, UR ~~LOC~~: NEGATIVE
Cannabinoid 50 Ng, Ur ~~LOC~~: NEGATIVE
MDMA (ECSTASY) UR SCREEN: NEGATIVE
Methadone, Ur Screen: NEGATIVE
Opiate, Ur Screen: NEGATIVE
PHENCYCLIDINE (PCP) UR S: NEGATIVE
Tricyclic, Ur Screen: NEGATIVE

## 2014-09-11 LAB — ETHANOL

## 2014-09-11 LAB — SALICYLATE LEVEL: Salicylates, Serum: <4 mg/dL

## 2014-09-17 NOTE — Consult Note (Signed)
PATIENT NAME:  Stephanie Anthony, Stephanie Anthony MR#:  [id number] DATE OF BIRTH:  Mar 26, 1934  DATE OF CONSULTATION:  09/13/2014  REFERRING PHYSICIAN:   CONSULTING PHYSICIAN:  Tarrell Debes K. Psalms Olarte, MD  AGE:  81 years. SEX:  Female. RACE:  White.  SUBJECTIVE:  The patient is a 81 year old white female who is retired after doing jobs in a [employer].  The patient is widowed since 2002 after being married for 48 years.  The patient lives in her own house that has 2 bedrooms.  The patient has a long history of mental illness and is being followed on an outpatient basis by [name] for many years for bipolar disorder and depression.  The patient reports that her 32 year old sister, with whom she gets along very well, moved in with her 57 year old son and later on her 67 year old son also moved in.  She gets along very well with her sister who is no problem at all.  However, recently she started having conflicts and problems with all 3 of them to the extent she expressed wishes that she is not around.  This became a concern, so they called the ambulance to bring her here for help.    PAST PSYCHIATRIC HISTORY:  History of inpatient hospital psychiatry for 2 times in the past.  No history of suicide attempts.  The patient is being followed by Ulis Rias on an outpatient basis, last appointment several weeks ago.  Next appointment is coming up in a few weeks.  She is stabilized.    ALCOHOL AND DRUGS:  Denies drinking alcohol, denies use of prescription drugs.  Denies smoking.  MENTAL STATUS:  The patient is alert and oriented.  Calm, pleasant, and cooperative.  No agitation.  Affect is neutral and mood stable.  Denies feeling depressed.  Denies feeling hopeless or helpless.  Cognition intact.  Memory is intact.  No psychosis.  Does not appear to be responding to any stimuli.  Thoughts are under control.  Denies suicidal or homicidal plans.  Contract for safety.  The patient realizes that she has been overwhelmed with too many people  living in a small 2 bedroom house which caused her to have anxiety and problems dealing with the same and she wants to go home.  Her son is already helping her in finding a place for them to go.  Insight and judgment fair and adequate.  IMPRESSION:  Bipolar disorder type 2 depressed.  RECOMMENDATIONS:  Discontinue involuntary commitment.  Discontinue suicide precautions and suicidal watch and one-on-one observation and discharge patient home and she has enough medications at home and she has a followup appointment coming up with [name] I a few weeks.  Her son will come and pick her up and he is working on trying to find placement for the 2 nephews and other family members.   ____________________________ Wallace Cullens. Franchot Mimes, MD skc:sp D: 09/13/2014 13:25:20 ET T: 09/13/2014 13:48:50 ET JOB#: [id number]  cc: Arlyn Leak K. Franchot Mimes, MD, <Dictator> Dewain Penning MD ELECTRONICALLY SIGNED 09/15/2014 13:18

## 2014-09-19 DIAGNOSIS — N184 Chronic kidney disease, stage 4 (severe): Secondary | ICD-10-CM | POA: Insufficient documentation

## 2014-09-19 DIAGNOSIS — E7800 Pure hypercholesterolemia, unspecified: Secondary | ICD-10-CM | POA: Insufficient documentation

## 2014-09-25 ENCOUNTER — Emergency Department
Admission: EM | Admit: 2014-09-25 | Discharge: 2014-09-26 | Disposition: A | Discharge status: Other Acute Inpatient Hospital | Payer: Medicare Other | Attending: Emergency Medicine | Admitting: Emergency Medicine

## 2014-09-25 DIAGNOSIS — Y9389 Activity, other specified: Secondary | ICD-10-CM | POA: Insufficient documentation

## 2014-09-25 DIAGNOSIS — Y92009 Unspecified place in unspecified non-institutional (private) residence as the place of occurrence of the external cause: Secondary | ICD-10-CM | POA: Insufficient documentation

## 2014-09-25 DIAGNOSIS — F3130 Bipolar disorder, current episode depressed, mild or moderate severity, unspecified: Secondary | ICD-10-CM

## 2014-09-25 DIAGNOSIS — R451 Restlessness and agitation: Secondary | ICD-10-CM

## 2014-09-25 DIAGNOSIS — X781XXA Intentional self-harm by knife, initial encounter: Secondary | ICD-10-CM | POA: Diagnosis not present

## 2014-09-25 DIAGNOSIS — Z23 Encounter for immunization: Secondary | ICD-10-CM | POA: Insufficient documentation

## 2014-09-25 DIAGNOSIS — S61512A Laceration without foreign body of left wrist, initial encounter: Secondary | ICD-10-CM | POA: Insufficient documentation

## 2014-09-25 DIAGNOSIS — Y998 Other external cause status: Secondary | ICD-10-CM | POA: Diagnosis not present

## 2014-09-25 DIAGNOSIS — Z008 Encounter for other general examination: Secondary | ICD-10-CM | POA: Diagnosis present

## 2014-09-25 DIAGNOSIS — Z7289 Other problems related to lifestyle: Secondary | ICD-10-CM

## 2014-09-25 DIAGNOSIS — I10 Essential (primary) hypertension: Secondary | ICD-10-CM | POA: Diagnosis not present

## 2014-09-25 DIAGNOSIS — L02414 Cutaneous abscess of left upper limb: Secondary | ICD-10-CM | POA: Diagnosis not present

## 2014-09-25 HISTORY — DX: Atherosclerotic heart disease of native coronary artery without angina pectoris: I25.10

## 2014-09-25 HISTORY — DX: Cardiac murmur, unspecified: R01.1

## 2014-09-25 HISTORY — DX: Other seasonal allergic rhinitis: J30.2

## 2014-09-25 HISTORY — DX: Rheumatic mitral valve disease, unspecified: I05.9

## 2014-09-25 HISTORY — DX: Essential (primary) hypertension: I10

## 2014-09-25 HISTORY — DX: Bipolar disorder, unspecified: F31.9

## 2014-09-25 LAB — URINE DRUG SCREEN, QUALITATIVE (ARMC ONLY)
AMPHETAMINES, UR SCREEN: NOT DETECTED
BENZODIAZEPINE, UR SCRN: POSITIVE — AB
Barbiturates, Ur Screen: NOT DETECTED
COCAINE METABOLITE, UR ~~LOC~~: NOT DETECTED
Cannabinoid 50 Ng, Ur ~~LOC~~: NOT DETECTED
MDMA (Ecstasy)Ur Screen: NOT DETECTED
Methadone Scn, Ur: NOT DETECTED
Opiate, Ur Screen: NOT DETECTED
Phencyclidine (PCP) Ur S: NOT DETECTED
TRICYCLIC, UR SCREEN: NOT DETECTED

## 2014-09-25 LAB — CBC
HEMATOCRIT: 39.5 % (ref 35.0–47.0)
Hemoglobin: 13.1 g/dL (ref 12.0–16.0)
MCH: 26.7 pg (ref 26.0–34.0)
MCHC: 33.3 g/dL (ref 32.0–36.0)
MCV: 80.2 fL (ref 80.0–100.0)
Platelets: 177 10*3/uL (ref 150–440)
RBC: 4.92 MIL/uL (ref 3.80–5.20)
RDW: 14.6 % — ABNORMAL HIGH (ref 11.5–14.5)
WBC: 7 10*3/uL (ref 3.6–11.0)

## 2014-09-25 LAB — COMPREHENSIVE METABOLIC PANEL
ALT: 14 U/L (ref 14–54)
ANION GAP: 9 (ref 5–15)
AST: 23 U/L (ref 15–41)
Albumin: 4.3 g/dL (ref 3.5–5.0)
Alkaline Phosphatase: 48 U/L (ref 38–126)
BUN: 22 mg/dL — AB (ref 6–20)
CHLORIDE: 108 mmol/L (ref 101–111)
CO2: 25 mmol/L (ref 22–32)
Calcium: 9.3 mg/dL (ref 8.9–10.3)
Creatinine, Ser: 1.62 mg/dL — ABNORMAL HIGH (ref 0.44–1.00)
GFR calc non Af Amer: 29 mL/min — ABNORMAL LOW (ref >60)
GFR, EST AFRICAN AMERICAN: 34 mL/min — AB (ref >60)
Glucose, Bld: 104 mg/dL — ABNORMAL HIGH (ref 65–99)
POTASSIUM: 4.2 mmol/L (ref 3.5–5.1)
Sodium: 142 mmol/L (ref 135–145)
TOTAL PROTEIN: 7.2 g/dL (ref 6.5–8.1)
Total Bilirubin: 0.6 mg/dL (ref 0.3–1.2)

## 2014-09-25 LAB — ETHANOL

## 2014-09-25 LAB — ACETAMINOPHEN LEVEL: Acetaminophen (Tylenol), Serum: <10 ug/mL — ABNORMAL LOW (ref 10–30)

## 2014-09-25 LAB — SALICYLATE LEVEL: Salicylate Lvl: <4 mg/dL (ref 2.8–30.0)

## 2014-09-25 MED ORDER — ALPRAZOLAM 0.5 MG PO TABS
1.0000 mg | ORAL_TABLET | Freq: Two times a day (BID) | ORAL | Status: DC
  Administered 2014-09-25 – 2014-09-26 (×2): 1 mg via ORAL

## 2014-09-25 MED ORDER — LAMOTRIGINE 100 MG PO TABS
125.0000 mg | ORAL_TABLET | Freq: Every day | ORAL | Status: DC
  Administered 2014-09-25 – 2014-09-26 (×2): 125 mg via ORAL
  Filled 2014-09-25 (×2): qty 1

## 2014-09-25 MED ORDER — TETANUS-DIPHTHERIA TOXOIDS TD 5-2 LFU IM INJ
INJECTION | INTRAMUSCULAR | Status: AC
  Administered 2014-09-25: 0.5 mL via INTRAMUSCULAR
  Filled 2014-09-25: qty 0.5

## 2014-09-25 MED ORDER — ALPRAZOLAM 0.5 MG PO TABS
ORAL_TABLET | ORAL | Status: AC
  Administered 2014-09-25: 1 mg via ORAL
  Filled 2014-09-25: qty 2

## 2014-09-25 MED ORDER — TETANUS-DIPHTHERIA TOXOIDS TD 5-2 LFU IM INJ
0.5000 mL | INJECTION | Freq: Once | INTRAMUSCULAR | Status: AC
Start: 2014-09-25 — End: 2014-09-25
  Administered 2014-09-25: 0.5 mL via INTRAMUSCULAR

## 2014-09-25 NOTE — ED Notes (Signed)

## 2014-09-25 NOTE — ED Notes (Signed)
Pt assisted to toilet; she appears to have no difficulty walking; steady gait

## 2014-09-25 NOTE — ED Notes (Signed)
BEHAVIORAL HEALTH ROUNDING Patient sleeping: No. Patient alert and oriented: yes Behavior appropriate: Yes.  ; If no, describe:  Nutrition and fluids offered: Yes  Toileting and hygiene offered: Yes  Sitter present: no Law enforcement present: Yes  

## 2014-09-25 NOTE — ED Notes (Signed)
Per EMS patient was angry today due to something with family matters.  EMS reports son got there and patient stated "she was going to kill herself".  Reports son stated "i dont believe you". Which made patient angry and she attempted to slice wrist with scissors. [facility] arrived she tried to grab scissors again.  Pt has hx of bipolar.  Pt reports "i dont want to be here anymore I want to be with jesus and no one believes anything I saw".  Pt states "i just want to sleep in my own bed".

## 2014-09-25 NOTE — ED Notes (Signed)
BEHAVIORAL HEALTH ROUNDING Patient sleeping: No. Patient alert and oriented: yes Behavior appropriate: Yes.  ; If no, describe: pt with flat affect, sad Nutrition and fluids offered: Yes , pt declined Toileting and hygiene offered: Yes  Sitter present: no Law enforcement present: Yes

## 2014-09-25 NOTE — Consult Note (Signed)
[facility] Face-to-Face Psychiatry Consult   Reason for Consult:  Patient brought in under involuntary commitment reports of suicidal and aggressive behavior Referring Physician:  [email or web address] Patient Identification: Stephanie Anthony MRN:  [id number] Principal Diagnosis: Bipolar affective disorder, current episode depressed Diagnosis:   Patient Active Problem List   Diagnosis Date Noted  . Bipolar affective disorder, current episode depressed [F31.30] 09/25/2014    Total Time spent with patient: 1 hour  Subjective:   Stephanie Anthony is a 81 y.o. female patient admitted with patient's chief complaint "I'm not feeling good" patient states that she's been depressed and has been feeling bad for years. Brought in under involuntary commitment.  HPI:  Patient's commitment paperwork state law enforcement was called when she became agitated at home today. When they arrived she threatened to cut herself on the wrist. Patient says "I lost it". Says that she tried to cut herself with a knife and appears scissors. Mood is been bad for about a year. Sleep is poor. Appetite is poor. Feels anxious depressed and hopeless. Feels like no one cares about her. Denies hallucinations. Admits to intermittent suicidal thoughts. Has been compliant with medication prescribed by Dr. Chan who is her outpatient psychiatrist. Denies that she's been drinking or abusing drugs. Major stresses include difficulty living with her family at home.  Past psychiatric history positive for long-standing problems with depression carrying a diagnosis of bipolar disorder depressed. Had been on lithium for years in the past but ultimately couldn't tolerate it due to renal impairment. Now on lamotrigine. Positive past history of suicide attempts.  Family history is positive for mental health and 2 nephews.  Social history is notable for living at home with 2 younger relatives. Minimal social support her outside activity.  Medical history positive for a  recent episode that she says she still thinks was a stroke. Not able to describe other medical problems.  Substance abuse history is reported as being negative for alcohol or drug abuse. HPI Elements:   Quality:  Depressed mood agitation. Severity:  Moderate to severe. Timing:  Worse over the last several months. Duration:  Getting much worse in the last day. Context:  Major stress from lack of social support.  Past Medical History:  Past Medical History  Diagnosis Date  . Bipolar 1 disorder   . Seasonal allergies   . Murmur, cardiac   . Coronary artery disease   . Chronic mitral valve disease   . Hypertension     Past Surgical History  Procedure Laterality Date  . Thyroidectomy    . Cholecystectomy     Family History: No family history on file. Social History:  History  Alcohol Use No     History  Drug Use No    History   Social History  . Marital Status: Widowed    Spouse Name: N/A  . Number of Children: N/A  . Years of Education: N/A   Social History Main Topics  . Smoking status: Never Smoker   . Smokeless tobacco: Not on file  . Alcohol Use: No  . Drug Use: No  . Sexual Activity: Not on file   Other Topics Concern  . None   Social History Narrative  . None   Additional Social History:    Pain Medications: na History of alcohol / drug use?: No history of alcohol / drug abuse                     Allergies:     Allergies  Allergen Reactions  . Aspirin Nausea Only    Rectal bleeding.  . Codeine Nausea And Vomiting  . Codeine Sulfate Nausea Only  . Prednisone Other (See Comments)  . Valium [Diazepam] Other (See Comments)    Strange feelings.    Labs:  Results for orders placed or performed during the hospital encounter of 09/25/14 (from the past 48 hour(s))  Acetaminophen level     Status: Abnormal   Collection Time: 09/25/14 11:50 AM  Result Value Ref Range   Acetaminophen (Tylenol), Serum <10 (L) 10 - 30 ug/mL    Comment:         THERAPEUTIC CONCENTRATIONS VARY SIGNIFICANTLY. A RANGE OF 10-30 ug/mL MAY BE AN EFFECTIVE CONCENTRATION FOR MANY PATIENTS. HOWEVER, SOME ARE BEST TREATED AT CONCENTRATIONS OUTSIDE THIS RANGE. ACETAMINOPHEN CONCENTRATIONS >150 ug/mL AT 4 HOURS AFTER INGESTION AND >50 ug/mL AT 12 HOURS AFTER INGESTION ARE OFTEN ASSOCIATED WITH TOXIC REACTIONS.   CBC     Status: Abnormal   Collection Time: 09/25/14 11:50 AM  Result Value Ref Range   WBC 7.0 3.6 - 11.0 K/uL   RBC 4.92 3.80 - 5.20 MIL/uL   Hemoglobin 13.1 12.0 - 16.0 g/dL   HCT 39.5 35.0 - 47.0 %   MCV 80.2 80.0 - 100.0 fL   MCH 26.7 26.0 - 34.0 pg   MCHC 33.3 32.0 - 36.0 g/dL   RDW 14.6 (H) 11.5 - 14.5 %   Platelets 177 150 - 440 K/uL  Comprehensive metabolic panel     Status: Abnormal   Collection Time: 09/25/14 11:50 AM  Result Value Ref Range   Sodium 142 135 - 145 mmol/L   Potassium 4.2 3.5 - 5.1 mmol/L   Chloride 108 101 - 111 mmol/L   CO2 25 22 - 32 mmol/L   Glucose, Bld 104 (H) 65 - 99 mg/dL   BUN 22 (H) 6 - 20 mg/dL   Creatinine, Ser 1.62 (H) 0.44 - 1.00 mg/dL   Calcium 9.3 8.9 - 10.3 mg/dL   Total Protein 7.2 6.5 - 8.1 g/dL   Albumin 4.3 3.5 - 5.0 g/dL   AST 23 15 - 41 U/L   ALT 14 14 - 54 U/L   Alkaline Phosphatase 48 38 - 126 U/L   Total Bilirubin 0.6 0.3 - 1.2 mg/dL   GFR calc non Af Amer 29 (L) >60 mL/min   GFR calc Af Amer 34 (L) >60 mL/min    Comment: (NOTE) The eGFR has been calculated using the CKD EPI equation. This calculation has not been validated in all clinical situations. eGFR's persistently <60 mL/min signify possible Chronic Kidney Disease.    Anion gap 9 5 - 15  Ethanol (ETOH)     Status: None   Collection Time: 09/25/14 11:50 AM  Result Value Ref Range   Alcohol, Ethyl (B) <5 <5 mg/dL    Comment:        LOWEST DETECTABLE LIMIT FOR SERUM ALCOHOL IS 11 mg/dL FOR MEDICAL PURPOSES ONLY   Salicylate level     Status: None   Collection Time: 09/25/14 11:50 AM  Result Value Ref Range    Salicylate Lvl <4.0 2.8 - 30.0 mg/dL  Urine Drug Screen, Qualitative ([facility])     Status: Abnormal   Collection Time: 09/25/14 11:50 AM  Result Value Ref Range   Tricyclic, Ur Screen NONE DETECTED NONE DETECTED   Amphetamines, Ur Screen NONE DETECTED NONE DETECTED   MDMA (Ecstasy)Ur Screen NONE DETECTED NONE DETECTED   Cocaine Metabolite,Ur   [place] NONE DETECTED NONE DETECTED   Opiate, Ur Screen NONE DETECTED NONE DETECTED   Phencyclidine (PCP) Ur S NONE DETECTED NONE DETECTED   Cannabinoid 50 Ng, Ur [place] NONE DETECTED NONE DETECTED   Barbiturates, Ur Screen NONE DETECTED NONE DETECTED   Benzodiazepine, Ur Scrn POSITIVE (A) NONE DETECTED   Methadone Scn, Ur NONE DETECTED NONE DETECTED    Comment: (NOTE) 676  Tricyclics, urine               Cutoff 1000 ng/mL 200  Amphetamines, urine             Cutoff 1000 ng/mL 300  MDMA (Ecstasy), urine           Cutoff 500 ng/mL 400  Cocaine Metabolite, urine       Cutoff 300 ng/mL 500  Opiate, urine                   Cutoff 300 ng/mL 600  Phencyclidine (PCP), urine      Cutoff 25 ng/mL 700  Cannabinoid, urine              Cutoff 50 ng/mL 800  Barbiturates, urine             Cutoff 200 ng/mL 900  Benzodiazepine, urine           Cutoff 200 ng/mL 1000 Methadone, urine                Cutoff 300 ng/mL 1100 1200 The urine drug screen provides only a preliminary, unconfirmed 1300 analytical test result and should not be used for non-medical 1400 purposes. Clinical consideration and professional judgment should 1500 be applied to any positive drug screen result due to possible 1600 interfering substances. A more specific alternate chemical method 1700 must be used in order to obtain a confirmed analytical result.  1800 Gas chromato graphy / mass spectrometry (GC/MS) is the preferred 1900 confirmatory method.     Vitals: Blood pressure 128/80, pulse 72, temperature 98.4 F (36.9 C), temperature source Oral, resp. rate 21, SpO2 97 %.  Risk to Self:  Suicidal Ideation: Yes-Currently Present Suicidal Intent: Yes-Currently Present Is patient at risk for suicide?: Yes Suicidal Plan?: Yes-Currently Present Specify Current Suicidal Plan: cut wrist Access to Means: Yes Specify Access to Suicidal Means: kitchen knives What has been your use of drugs/alcohol within the last 12 months?: none How many times?: 1 Other Self Harm Risks: none Triggers for Past Attempts: Unpredictable Intentional Self Injurious Behavior: None Risk to Others: Homicidal Ideation: No-Not Currently/Within Last 6 Months Thoughts of Harm to Others: No-Not Currently Present/Within Last 6 Months Current Homicidal Intent: No-Not Currently/Within Last 6 Months Current Homicidal Plan: No-Not Currently/Within Last 6 Months Access to Homicidal Means: No History of harm to others?: No Assessment of Violence: None Noted Criminal Charges Pending?: No Does patient have a court date: No Prior Inpatient Therapy: Prior Inpatient Therapy: No Prior Outpatient Therapy: Prior Outpatient Therapy: Yes Prior Therapy Dates: current Prior Therapy Facilty/Provider(s): Dr. Ulis Rias Reason for Treatment: Bipolar Does patient have an ACCT team?: No Does patient have Intensive In-House Services?  : No Does patient have [facility] services? : No Does patient have [facility] services?: No  No current facility-administered medications for this encounter.   Current Outpatient Prescriptions  Medication Sig Dispense Refill  . ALPRAZolam (XANAX) 0.5 MG tablet Take 0.25-0.5 mg by mouth 3 (three) times daily. Take 0.83m twice daily and 0.268mat bedtime.    . Cholecalciferol 1000 UNITS tablet  Take 1 tablet by mouth daily.    . Cyanocobalamin 1000 MCG TBCR Take 1 tablet by mouth daily.    . enalapril (VASOTEC) 2.5 MG tablet Take 1 tablet by mouth daily.    . furosemide (LASIX) 20 MG tablet Take 1 tablet by mouth daily as needed.    . lamoTRIgine (LAMICTAL) 100 MG tablet Take 1 tablet by mouth at  bedtime. Take along with 25mg tablet to equal 125mg daily.    . lamoTRIgine (LAMICTAL) 25 MG tablet Take 1 tablet by mouth at bedtime. Take along with a 100mg tablet to equal 125mg daily.    . latanoprost (XALATAN) 0.005 % ophthalmic solution Place 1 drop into both eyes at bedtime.    . levothyroxine (SYNTHROID, LEVOTHROID) 88 MCG tablet Take 1 tablet by mouth daily.    . lovastatin (MEVACOR) 20 MG tablet Take 1 tablet by mouth daily.      Musculoskeletal: Strength & Muscle Tone: decreased Gait & Station: ataxic Patient leans: N/A  Psychiatric Specialty Exam: Physical Exam  Constitutional: She is oriented to person, place, and time. She appears well-developed and well-nourished.  HENT:  Head: Normocephalic.  Eyes: Pupils are equal, round, and reactive to light.  Respiratory: Effort normal.  Neurological: She is alert and oriented to person, place, and time.  Skin: Skin is warm and dry.  Psychiatric: Her mood appears anxious. Her speech is delayed. She is withdrawn. Cognition and memory are impaired. She expresses impulsivity. She exhibits a depressed mood. She expresses suicidal ideation. She exhibits abnormal recent memory.    Review of Systems  Constitutional: Negative.   HENT: Negative.   Eyes: Negative.   Respiratory: Negative.   Cardiovascular: Negative.   Gastrointestinal: Negative.   Skin: Negative.   Neurological: Negative.   Psychiatric/Behavioral: Positive for depression and suicidal ideas. The patient is nervous/anxious and has insomnia.     Blood pressure 128/80, pulse 72, temperature 98.4 F (36.9 C), temperature source Oral, resp. rate 21, SpO2 97 %.There is no height or weight on file to calculate BMI.  General Appearance: Fairly Groomed  Eye Contact::  Fair  Speech:  Slow  Volume:  Decreased  Mood:  Depressed  Affect:  Depressed  Thought Process:  Circumstantial  Orientation:  Full (Time, Place, and Person)  Thought Content:  Negative  Suicidal Thoughts:   Yes.  without intent/plan  Homicidal Thoughts:  No  Memory:  Immediate;   Fair Recent;   Poor Remote;   Poor  Judgement:  Poor  Insight:  Shallow  Psychomotor Activity:  Decreased  Concentration:  Fair  Recall:  Fair  Fund of Knowledge:Fair  Language: Fair  Akathisia:  No  Handed:  Right  AIMS (if indicated):     Assets:  Desire for Improvement Housing Social Support  ADL's:  Intact  Cognition: Impaired,  Moderate  Sleep:      Medical Decision Making: Established Problem, Worsening (2), Review or order medicine tests (1), Review of Medication Regimen & Side Effects (2) and Review of New Medication or Change in Dosage (2)  Treatment Plan Summary: Plan Patient appears to be very depressed with suicidal ideation and behavior out of control agitated behavior at home. Continues to be very dysphoric and down. Not responding to current medication. Requires inpatient hospitalization. Patient will be referred to a geriatric psychiatry unit. Continue lamotrigine and Xanax which are her current outpatient medicines. Lamotrigine 125 mg a day Xanax 1 mg twice a day. Supportive and educational counseling. Case discussed with emergency room   doctor and psychiatric staff.  Plan:  Recommend psychiatric Inpatient admission when medically cleared. Disposition: Refer to geriatric psychiatry unit continue current medication for now continue IVC  Leston Schueller, [facility] 09/25/2014 8:03 PM

## 2014-09-25 NOTE — ED Provider Notes (Signed)
[facility] Emergency Department Provider Note  ____________________________________________  Time seen: Approximately 2:30 PM  I have reviewed the triage vital signs and the nursing notes.   HISTORY  Chief Complaint Psychiatric Evaluation    HPI Stephanie Anthony is a 81 y.o. female with a history of bipolar disorder who presents today after cutting self with knife and then threatening self with scissors. Patient was having agitated behavior and from alcohol [place] police. Brought into the emergency department with involuntary commitment papers were completed.Patient denies suicidal or homicidal ideation. Is not exposing any further disarm self.   Past Medical History  Diagnosis Date  . Bipolar 1 disorder   . Seasonal allergies   . Murmur, cardiac   . Coronary artery disease   . Chronic mitral valve disease   . Hypertension     There are no active problems to display for this patient.   Past Surgical History  Procedure Laterality Date  . Thyroidectomy    . Cholecystectomy      Current Outpatient Rx  Name  Route  Sig  Dispense  Refill  . ALPRAZolam (XANAX) 0.5 MG tablet   Oral   Take 0.25-0.5 mg by mouth 3 (three) times daily. Take 0.5mg  twice daily and 0.25mg  at bedtime.         . Cholecalciferol 1000 UNITS tablet   Oral   Take 1 tablet by mouth daily.         . Cyanocobalamin 1000 MCG TBCR   Oral   Take 1 tablet by mouth daily.         . enalapril (VASOTEC) 2.5 MG tablet   Oral   Take 1 tablet by mouth daily.         . furosemide (LASIX) 20 MG tablet   Oral   Take 1 tablet by mouth daily as needed.         . lamoTRIgine (LAMICTAL) 100 MG tablet   Oral   Take 1 tablet by mouth at bedtime. Take along with 25mg  tablet to equal 125mg  daily.         [name] lamoTRIgine (LAMICTAL) 25 MG tablet   Oral   Take 1 tablet by mouth at bedtime. Take along with a 100mg  tablet to equal 125mg  daily.         [name] latanoprost (XALATAN) 0.005 %  ophthalmic solution   Both Eyes   Place 1 drop into both eyes at bedtime.         [name] levothyroxine (SYNTHROID, LEVOTHROID) 88 MCG tablet   Oral   Take 1 tablet by mouth daily.         [name] lovastatin (MEVACOR) 20 MG tablet   Oral   Take 1 tablet by mouth daily.           Allergies Aspirin; Codeine; Codeine sulfate; Prednisone; and Valium  No family history on file.  Social History History  Substance Use Topics  . Smoking status: Never Smoker   . Smokeless tobacco: Not on file  . Alcohol Use: No    Review of Systems Constitutional: No fever/chills Eyes: No visual changes. ENT: No sore throat. Cardiovascular: Denies chest pain. Respiratory: Denies shortness of breath. Gastrointestinal: No abdominal pain.  No nausea, no vomiting.  No diarrhea.  No constipation. Genitourinary: Negative for dysuria. Musculoskeletal: Negative for back pain. Skin: Negative for rash. Neurological: Negative for headaches, focal weakness or numbness. Psychiatric:  Aggressive behavior.  10-point ROS otherwise negative.  ____________________________________________   PHYSICAL EXAM:  VITAL SIGNS: ED Triage Vitals  Enc Vitals Group     BP 09/25/14 1131 128/80 mmHg     Pulse Rate 09/25/14 1131 72     Resp 09/25/14 1131 21     Temp 09/25/14 1131 98.4 F (36.9 C)     Temp Source 09/25/14 1131 Oral     SpO2 09/25/14 1131 97 %     Weight --      Height --      Head Cir --      Peak Flow --      Pain Score --      Pain Loc --      Pain Edu? --      Excl. in [place]? --     Constitutional: Alert and oriented. Well appearing and in no acute distress. Eyes: Conjunctivae are normal. PERRL. EOMI. Head: Atraumatic. Nose: No congestion/rhinnorhea. Mouth/Throat: Mucous membranes are moist.  Oropharynx non-erythematous. Neck: No stridor.   Cardiovascular: Normal rate, regular rhythm. Grossly normal heart sounds.  Good peripheral circulation. Respiratory: Normal respiratory effort.  No  retractions. Lungs CTAB. Gastrointestinal: Soft and nontender. No distention. No abdominal bruits. No CVA tenderness. Musculoskeletal: No lower extremity tenderness nor edema.  No joint effusions. Neurologic:  Normal speech and language. No gross focal neurologic deficits are appreciated. Speech is normal. No gait instability. Skin:  Left volar wrist distally with several 3-4 cm superficial lacerations. There is no surrounding erythema, pus, induration or tenderness to palpation.  Psychiatric: Calm but with agitated tone of voice. Continues to deny SI and HI. ____________________________________________   LABS (all labs ordered are listed, but only abnormal results are displayed)  Labs Reviewed  ACETAMINOPHEN LEVEL - Abnormal; Notable for the following:    Acetaminophen (Tylenol), Serum <10 (*)    All other components within normal limits  CBC - Abnormal; Notable for the following:    RDW 14.6 (*)    All other components within normal limits  COMPREHENSIVE METABOLIC PANEL - Abnormal; Notable for the following:    Glucose, Bld 104 (*)    BUN 22 (*)    Creatinine, Ser 1.62 (*)    GFR calc non Af Amer 29 (*)    GFR calc Af Amer 34 (*)    All other components within normal limits  URINE DRUG SCREEN, QUALITATIVE ([facility]) - Abnormal; Notable for the following:    Benzodiazepine, Ur Scrn POSITIVE (*)    All other components within normal limits  ETHANOL  SALICYLATE LEVEL   ____________________________________________  EKG  ____________________________________________  RADIOLOGY   ____________________________________________   PROCEDURES    ____________________________________________   INITIAL IMPRESSION / ASSESSMENT AND PLAN / ED COURSE  Pertinent labs & imaging results that were available during my care of the patient were reviewed by me and considered in my medical decision making (see chart for details).  We will update patient's tetanus shot. Patient does not know  time of last tetanus shot. IVC paperwork completed. Pending psychiatric consult and behavioral health intake. ____________________________________________   FINAL CLINICAL IMPRESSION(S) / ED DIAGNOSES  Self-mutilation. Lacerations to volar left wrist. Acute, initial visit.    Doran Stabler, MD 09/25/14 [phone]

## 2014-09-25 NOTE — BH Assessment (Signed)
Assessment Note  Stephanie Anthony is an 81 y.o. female. Patient brought into the ED because of suicide ideation with a plan to cut wrist.  Patient reports she used scissors to curt wrist and been feeling depressed for months.  Patient reports having family stressors in her home but her son is currently giving them notification to leave the home.  She reports currently participating with Dr. Ulis Rias for outpatient services with a diagnosis of Bipolar Disorder.  Patient reports she is compliant with all her medications but missed the dose this morning.  Patient currently denies HI, A/VH, and other self injurious behaviors.  Patient gave this Probation officer permission to speak with her son.    CSW met with son prior to his visit with the patient.  He reports his mother was diagnosed over 40 years ago.  Patient reports the patient threatened to kill herself today with a steak knife but when she thought no one believed her she picked up the scissors.  He reports the patient was brought to the ED 2 weeks ago for reported stroke symptoms but was medically cleared then evaluated by psychiatry.    CSW consulted with Dr. Weber Cooks it is recommended to refer for inpatient treatment for stabilization and safety.    Axis I: Bipolar, Depressed Axis II: Deferred Axis III:  Past Medical History  Diagnosis Date  . Bipolar 1 disorder   . Seasonal allergies   . Murmur, cardiac   . Coronary artery disease   . Chronic mitral valve disease   . Hypertension    Axis IV: housing problems, other psychosocial or environmental problems, problems related to social environment and problems with primary support group Axis V: 41-50 serious symptoms  Past Medical History:  Past Medical History  Diagnosis Date  . Bipolar 1 disorder   . Seasonal allergies   . Murmur, cardiac   . Coronary artery disease   . Chronic mitral valve disease   . Hypertension     Past Surgical History  Procedure Laterality Date  . Thyroidectomy     . Cholecystectomy      Family History: No family history on file.  Social History:  reports that she has never smoked. She does not have any smokeless tobacco history on file. She reports that she does not drink alcohol or use illicit drugs.  Additional Social History:  Alcohol / Drug Use Pain Medications: na History of alcohol / drug use?: No history of alcohol / drug abuse  CIWA: CIWA-Ar BP: 128/80 mmHg Pulse Rate: 72 Nausea and Vomiting: no nausea and no vomiting Tactile Disturbances: none Tremor: no tremor Auditory Disturbances: not present Paroxysmal Sweats: no sweat visible Visual Disturbances: not present Anxiety: no anxiety, at ease Headache, Fullness in Head: none present Agitation: normal activity Orientation and Clouding of Sensorium: oriented and can do serial additions CIWA-Ar Total: 0 COWS: Clinical Opiate Withdrawal Scale (COWS) Resting Pulse Rate: Pulse Rate 80 or below Sweating: No report of chills or flushing Restlessness: Able to sit still Pupil Size: Pupils pinned or normal size for room light Bone or Joint Aches: Not present Runny Nose or Tearing: Not present GI Upset: No GI symptoms Tremor: No tremor Yawning: No yawning Anxiety or Irritability: None Gooseflesh Skin: Skin is smooth COWS Total Score: 0  Allergies:  Allergies  Allergen Reactions  . Aspirin Nausea Only    Rectal bleeding.  . Codeine Nausea And Vomiting  . Codeine Sulfate Nausea Only  . Prednisone Other (See Comments)  . Valium [  Diazepam] Other (See Comments)    Strange feelings.    Home Medications:  (Not in a hospital admission)  OB/GYN Status:  No LMP recorded. Patient is postmenopausal.  General Assessment Data Location of Assessment: [facility] ED TTS Assessment: In system Is this a Tele or Face-to-Face Assessment?: Face-to-Face Is this an Initial Assessment or a Re-assessment for this encounter?: Initial Assessment Marital status: Widowed Is patient pregnant?:  No Pregnancy Status: No Living Arrangements: Alone, Other relatives Can pt return to current living arrangement?: Yes Admission Status: Involuntary Is patient capable of signing voluntary admission?: No Referral Source: Self/Family/Friend  Medical Screening Exam [facility] Walk-in ONLY) Medical Exam completed: Yes  Crisis Care Plan Living Arrangements: Alone, Other relatives Name of Psychiatrist: Dr. Ulis Rias Name of Therapist: none  Education Status Is patient currently in school?: No  Risk to self with the past 6 months Suicidal Ideation: Yes-Currently Present Has patient been a risk to self within the past 6 months prior to admission? : Yes Suicidal Intent: Yes-Currently Present Has patient had any suicidal intent within the past 6 months prior to admission? : Yes Is patient at risk for suicide?: Yes Suicidal Plan?: Yes-Currently Present Has patient had any suicidal plan within the past 6 months prior to admission? : Yes Specify Current Suicidal Plan: cut wrist Access to Means: Yes Specify Access to Suicidal Means: kitchen knives What has been your use of drugs/alcohol within the last 12 months?: none Previous Attempts/Gestures: Yes How many times?: 1 Other Self Harm Risks: none Triggers for Past Attempts: Unpredictable Intentional Self Injurious Behavior: None Family Suicide History: No Recent stressful life event(s): Conflict (Comment), Loss (Comment) Persecutory voices/beliefs?: No Depression: Yes Depression Symptoms: Loss of interest in usual pleasures, Feeling worthless/self pity, Feeling angry/irritable, Isolating, Fatigue, Despondent (hopeless) Substance abuse history and/or treatment for substance abuse?: No  Risk to Others within the past 6 months Homicidal Ideation: No-Not Currently/Within Last 6 Months Does patient have any lifetime risk of violence toward others beyond the six months prior to admission? : No Thoughts of Harm to Others: No-Not Currently  Present/Within Last 6 Months Current Homicidal Intent: No-Not Currently/Within Last 6 Months Current Homicidal Plan: No-Not Currently/Within Last 6 Months Access to Homicidal Means: No History of harm to others?: No Assessment of Violence: None Noted Criminal Charges Pending?: No Does patient have a court date: No Is patient on probation?: No  Psychosis Hallucinations: None noted Delusions: None noted  Mental Status Report Appearance/Hygiene: In hospital gown Eye Contact: Fair Motor Activity: Other (Comment) (lethargic) Speech: Slow, Slurred Level of Consciousness: Alert Mood: Irritable Affect: Flat Anxiety Level: None Thought Processes: Coherent Judgement: Impaired Obsessive Compulsive Thoughts/Behaviors: None  Cognitive Functioning Concentration: Fair IQ: Average Insight: Fair Impulse Control: Poor Appetite: Poor Sleep: No Change Vegetative Symptoms: None  ADLScreening [facility] Assessment Services) Patient's cognitive ability adequate to safely complete daily activities?: Yes Patient able to express need for assistance with ADLs?: Yes Independently performs ADLs?: Yes (appropriate for developmental age)  Prior Inpatient Therapy Prior Inpatient Therapy: No  Prior Outpatient Therapy Prior Outpatient Therapy: Yes Prior Therapy Dates: current Prior Therapy Facilty/Provider(s): Dr. Ulis Rias Reason for Treatment: Bipolar Does patient have an ACCT team?: No Does patient have Intensive In-House Services?  : No Does patient have [facility] services? : No Does patient have P4CC services?: No  ADL Screening (condition at time of admission) Patient's cognitive ability adequate to safely complete daily activities?: Yes Patient able to express need for assistance with ADLs?: Yes Independently performs ADLs?: Yes (appropriate  for developmental age)       Abuse/Neglect Assessment (Assessment to be complete while patient is alone) Physical Abuse: Denies Verbal Abuse:  Denies Sexual Abuse: Denies Exploitation of patient/patient's resources: Denies Self-Neglect: Yes, present (Comment) (decreased eating, decreased appetite,) Values / Beliefs Cultural Requests During Hospitalization: None Spiritual Requests During Hospitalization: None Consults Spiritual Care Consult Needed: No Social Work Consult Needed: No Regulatory affairs officer (For Healthcare) Does patient have an advance directive?: No Would patient like information on creating an advanced directive?: Yes Higher education careers adviser given    Additional Information 1:1 In Past 12 Months?: No CIRT Risk: No Elopement Risk: No Does patient have medical clearance?: Yes     Disposition:  Disposition Initial Assessment Completed for this Encounter: Yes Disposition of Patient: Inpatient treatment program Type of inpatient treatment program: Adult  On Site Evaluation by:   Reviewed with Physician:    Chesley Noon A 09/25/2014 3:56 PM
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
## 2014-09-26 DIAGNOSIS — F314 Bipolar disorder, current episode depressed, severe, without psychotic features: Secondary | ICD-10-CM | POA: Insufficient documentation

## 2014-09-26 MED ORDER — LATANOPROST 0.005 % OP SOLN
1.0000 [drp] | Freq: Every day | OPHTHALMIC | Status: DC
  Filled 2014-09-26: qty 2.5

## 2014-09-26 MED ORDER — PRAVASTATIN SODIUM 20 MG PO TABS
20.0000 mg | ORAL_TABLET | Freq: Every day | ORAL | Status: DC
  Administered 2014-09-26: 20 mg via ORAL
  Filled 2014-09-26 (×3): qty 1

## 2014-09-26 MED ORDER — VITAMIN D3 25 MCG (1000 UNIT) PO TABS
1000.0000 [IU] | ORAL_TABLET | Freq: Every day | ORAL | Status: DC
  Administered 2014-09-26: 1000 [IU] via ORAL
  Filled 2014-09-26 (×2): qty 1

## 2014-09-26 MED ORDER — VITAMIN B-12 1000 MCG PO TABS
1000.0000 ug | ORAL_TABLET | Freq: Every day | ORAL | Status: DC
  Administered 2014-09-26: 1000 ug via ORAL
  Filled 2014-09-26 (×2): qty 1

## 2014-09-26 MED ORDER — LEVOTHYROXINE SODIUM 88 MCG PO TABS
88.0000 ug | ORAL_TABLET | Freq: Every day | ORAL | Status: DC
  Administered 2014-09-26: 88 ug via ORAL
  Filled 2014-09-26 (×2): qty 1

## 2014-09-26 MED ORDER — ALPRAZOLAM 0.5 MG PO TABS
ORAL_TABLET | ORAL | Status: AC
  Administered 2014-09-26: 1 mg via ORAL
  Filled 2014-09-26: qty 2

## 2014-09-26 NOTE — ED Notes (Signed)
BEHAVIORAL HEALTH ROUNDING Patient sleeping: Yes.   Patient alert and oriented: not applicable Behavior appropriate: Yes.   Nutrition and fluids offered: No-pt sleeping Toileting and hygiene offered: No-pt sleeping Sitter present: yes Law enforcement present: Yes   Pt resting, equal rise and fall of chest, without distress.

## 2014-09-26 NOTE — ED Notes (Signed)
BEHAVIORAL HEALTH ROUNDING Patient sleeping: Yes.   Patient alert and oriented: not applicable Behavior appropriate: Yes.  ; If no, describe: sleeping Nutrition and fluids offered: sleeping Toileting and hygiene offered: sleeping Sitter present: no Law enforcement present: Yes

## 2014-09-26 NOTE — ED Provider Notes (Signed)
-----------------------------------------   2:09 AM on 09/26/2014 -----------------------------------------   BP 128/64 mmHg  Pulse 61  Temp(Src) 98.1 F (36.7 C) (Oral)  Resp 18  SpO2 97%  The patient had no acute events since last update.  Calm and cooperative at this time.  Disposition is pending per Psychiatry/Behavioral Medicine team recommendations.     Loney Hering, MD 09/26/14 [phone]

## 2014-09-26 NOTE — ED Notes (Signed)
BEHAVIORAL HEALTH ROUNDING Patient sleeping: No. Patient alert and oriented: yes Behavior appropriate: Yes.  ;  Nutrition and fluids offered: Yes  Toileting and hygiene offered: Yes  Sitter present: yes Law enforcement present: Yes  

## 2014-09-26 NOTE — ED Notes (Signed)
Patient is resting comfortably.  Patient ate 100% of her breakfast.  Patient is pleasant, alert and oriented to self and place.  Patient's respirations are even and non-labored.

## 2014-09-26 NOTE — ED Provider Notes (Signed)
-----------------------------------------   7:31 AM on 09/26/2014 -----------------------------------------   BP 128/64 mmHg  Pulse 61  Temp(Src) 98.1 F (36.7 C) (Oral)  Resp 18  SpO2 97%  The patient had no acute events since last update.  Calm and cooperative at this time.  Disposition is pending per Psychiatry/Behavioral Medicine team recommendations.     Orbie Pyo, MD 09/26/14 [phone]

## 2014-09-26 NOTE — ED Notes (Signed)
BEHAVIORAL HEALTH ROUNDING Patient sleeping: No. Patient alert and oriented: yes Behavior appropriate: Yes.  ; If no, describe: appropriate Nutrition and fluids offered: Yes  Toileting and hygiene offered: Yes  Sitter present: not applicable Law enforcement present: Yes

## 2014-09-26 NOTE — ED Notes (Signed)
BEHAVIORAL HEALTH ROUNDING Patient sleeping: No. Patient alert and oriented: yes Behavior appropriate: Yes.  ; If no, describe:  Nutrition and fluids offered: Yes  Toileting and hygiene offered: Yes  Sitter present: no Law enforcement present: Yes  

## 2014-09-26 NOTE — Progress Notes (Signed)
Faxed out referral for Geropsychiatry services to [place], [facility], [place], [place], and [place].

## 2014-09-26 NOTE — ED Notes (Signed)

## 2014-09-26 NOTE — ED Notes (Signed)
BEHAVIORAL HEALTH ROUNDING Patient sleeping: Yes.   Patient alert and oriented: not applicable Behavior appropriate: Yes.   Nutrition and fluids offered: Yes  Toileting and hygiene offered: Yes  Sitter present: yes Law enforcement present: Yes  

## 2014-09-26 NOTE — ED Notes (Signed)
BEHAVIORAL HEALTH ROUNDING Patient sleeping: Yes.   Patient alert and oriented: yes Behavior appropriate: Yes.  ; If no, describe:  Nutrition and fluids offered: Yes  Toileting and hygiene offered: Yes  Sitter present: yes Law enforcement present: Yes  

## 2014-09-26 NOTE — Progress Notes (Signed)
Client has been accepted by Dr. Bethanne Ginger @ [facility]; to the [facility]; per [facility] with admissions; and to call report [phone]; and can transport after lunch.

## 2014-09-26 NOTE — Consult Note (Signed)
  Psychiatry: Follow-up for this patient with major depression and recent suicidal threats. On interview today she still describes himself as very sad. Says that she still feels hopeless. She is noncommittal about suicidal ideation. On review of systems she does not report any new physical complaints. Says that she is eating okay. Still feels weak.  Somewhat disheveled elderly woman. Good eye contact. Psychomotor activity slow. Speech decreased in amount. Affect flat. Mood depressed. Thoughts lucid but slow. Passive suicidal ideation.  Diagnosis remains major depression rule out bipolar disorder depressed. Patient has been admitted to inpatient geriatric psychiatry ward. Transfer initiated. Psychoeducation completed with the patient.

## 2014-09-26 NOTE — ED Provider Notes (Signed)
-----------------------------------------   12:36 PM on 09/26/2014 -----------------------------------------   BP 101/67 mmHg  Pulse 53  Temp(Src) 97.6 F (36.4 C) (Oral)  Resp 18  SpO2 98%  Patient accepted to [facility]. Stable at this time. Calm and cooperative.     Orbie Pyo, MD 09/26/14 [phone]

## 2014-09-26 NOTE — ED Notes (Signed)
BEHAVIORAL HEALTH ROUNDING Patient sleeping: Yes.   Patient alert and oriented: yes Behavior appropriate: Yes.   Nutrition and fluids offered: No-pt sleeping Toileting and hygiene offered: No-pt sleeping Sitter present: yes Law enforcement present: Yes

## 2014-09-27 DIAGNOSIS — I38 Endocarditis, valve unspecified: Secondary | ICD-10-CM | POA: Insufficient documentation

## 2014-09-27 DIAGNOSIS — E890 Postprocedural hypothyroidism: Secondary | ICD-10-CM | POA: Insufficient documentation

## 2014-09-27 DIAGNOSIS — Z8639 Personal history of other endocrine, nutritional and metabolic disease: Secondary | ICD-10-CM | POA: Insufficient documentation

## 2014-09-27 DIAGNOSIS — E7849 Other hyperlipidemia: Secondary | ICD-10-CM | POA: Insufficient documentation

## 2014-09-27 DIAGNOSIS — E785 Hyperlipidemia, unspecified: Secondary | ICD-10-CM | POA: Insufficient documentation

## 2014-09-27 DIAGNOSIS — I080 Rheumatic disorders of both mitral and aortic valves: Secondary | ICD-10-CM | POA: Insufficient documentation

## 2014-09-27 DIAGNOSIS — N183 Chronic kidney disease, stage 3 unspecified: Secondary | ICD-10-CM | POA: Insufficient documentation

## 2014-10-17 ENCOUNTER — Emergency Department: Payer: Medicare Other

## 2014-10-17 ENCOUNTER — Encounter: Payer: Self-pay | Admitting: Occupational Medicine

## 2014-10-17 ENCOUNTER — Inpatient Hospital Stay
Admission: EM | Admit: 2014-10-17 | Discharge: 2014-10-22 | DRG: 392 | Disposition: A | Discharge status: Home / Self Care | Payer: Medicare Other | Attending: Internal Medicine | Admitting: Internal Medicine

## 2014-10-17 DIAGNOSIS — R109 Unspecified abdominal pain: Secondary | ICD-10-CM | POA: Diagnosis present

## 2014-10-17 DIAGNOSIS — I10 Essential (primary) hypertension: Secondary | ICD-10-CM | POA: Diagnosis present

## 2014-10-17 DIAGNOSIS — Z888 Allergy status to other drugs, medicaments and biological substances status: Secondary | ICD-10-CM

## 2014-10-17 DIAGNOSIS — I2510 Atherosclerotic heart disease of native coronary artery without angina pectoris: Secondary | ICD-10-CM | POA: Diagnosis present

## 2014-10-17 DIAGNOSIS — R609 Edema, unspecified: Secondary | ICD-10-CM | POA: Diagnosis present

## 2014-10-17 DIAGNOSIS — Z79899 Other long term (current) drug therapy: Secondary | ICD-10-CM

## 2014-10-17 DIAGNOSIS — Z885 Allergy status to narcotic agent status: Secondary | ICD-10-CM

## 2014-10-17 DIAGNOSIS — E860 Dehydration: Secondary | ICD-10-CM | POA: Diagnosis present

## 2014-10-17 DIAGNOSIS — J302 Other seasonal allergic rhinitis: Secondary | ICD-10-CM | POA: Diagnosis present

## 2014-10-17 DIAGNOSIS — K388 Other specified diseases of appendix: Secondary | ICD-10-CM | POA: Diagnosis present

## 2014-10-17 DIAGNOSIS — R1030 Lower abdominal pain, unspecified: Secondary | ICD-10-CM

## 2014-10-17 DIAGNOSIS — D649 Anemia, unspecified: Secondary | ICD-10-CM | POA: Diagnosis present

## 2014-10-17 DIAGNOSIS — K210 Gastro-esophageal reflux disease with esophagitis: Secondary | ICD-10-CM | POA: Diagnosis present

## 2014-10-17 DIAGNOSIS — I059 Rheumatic mitral valve disease, unspecified: Secondary | ICD-10-CM | POA: Diagnosis present

## 2014-10-17 DIAGNOSIS — F319 Bipolar disorder, unspecified: Secondary | ICD-10-CM | POA: Diagnosis present

## 2014-10-17 DIAGNOSIS — Z9049 Acquired absence of other specified parts of digestive tract: Secondary | ICD-10-CM | POA: Diagnosis present

## 2014-10-17 DIAGNOSIS — R935 Abnormal findings on diagnostic imaging of other abdominal regions, including retroperitoneum: Secondary | ICD-10-CM

## 2014-10-17 DIAGNOSIS — K529 Noninfective gastroenteritis and colitis, unspecified: Secondary | ICD-10-CM | POA: Diagnosis not present

## 2014-10-17 DIAGNOSIS — R011 Cardiac murmur, unspecified: Secondary | ICD-10-CM | POA: Diagnosis present

## 2014-10-17 DIAGNOSIS — K2980 Duodenitis without bleeding: Secondary | ICD-10-CM | POA: Diagnosis present

## 2014-10-17 DIAGNOSIS — N179 Acute kidney failure, unspecified: Secondary | ICD-10-CM | POA: Diagnosis present

## 2014-10-17 DIAGNOSIS — R112 Nausea with vomiting, unspecified: Secondary | ICD-10-CM

## 2014-10-17 LAB — URINALYSIS COMPLETE WITH MICROSCOPIC (ARMC ONLY)
Bacteria, UA: NONE SEEN
Bilirubin Urine: NEGATIVE
Glucose, UA: NEGATIVE mg/dL
NITRITE: NEGATIVE
Protein, ur: NEGATIVE mg/dL
Specific Gravity, Urine: 1.004 — ABNORMAL LOW (ref 1.005–1.030)
pH: 6 (ref 5.0–8.0)

## 2014-10-17 LAB — CBC WITH DIFFERENTIAL/PLATELET
Basophils Absolute: 0 10*3/uL (ref 0–0.1)
Basophils Relative: 0 %
Eosinophils Absolute: 0.2 10*3/uL (ref 0–0.7)
Eosinophils Relative: 1 %
HEMATOCRIT: 46.3 % (ref 35.0–47.0)
Hemoglobin: 15.2 g/dL (ref 12.0–16.0)
LYMPHS ABS: 2.9 10*3/uL (ref 1.0–3.6)
Lymphocytes Relative: 18 %
MCH: 26.2 pg (ref 26.0–34.0)
MCHC: 32.8 g/dL (ref 32.0–36.0)
MCV: 79.8 fL — ABNORMAL LOW (ref 80.0–100.0)
MONO ABS: 1.6 10*3/uL — AB (ref 0.2–0.9)
MONOS PCT: 10 %
NEUTROS ABS: 11.1 10*3/uL — AB (ref 1.4–6.5)
NEUTROS PCT: 71 %
Platelets: 221 10*3/uL (ref 150–440)
RBC: 5.8 MIL/uL — AB (ref 3.80–5.20)
RDW: 14.2 % (ref 11.5–14.5)
WBC: 15.7 10*3/uL — ABNORMAL HIGH (ref 3.6–11.0)

## 2014-10-17 LAB — SEDIMENTATION RATE: Sed Rate: 12 mm/hr (ref 0–30)

## 2014-10-17 LAB — COMPREHENSIVE METABOLIC PANEL
ALBUMIN: 4.3 g/dL (ref 3.5–5.0)
ALK PHOS: 44 U/L (ref 38–126)
ALT: 12 U/L — ABNORMAL LOW (ref 14–54)
AST: 24 U/L (ref 15–41)
Anion gap: 15 (ref 5–15)
BILIRUBIN TOTAL: 0.8 mg/dL (ref 0.3–1.2)
BUN: 19 mg/dL (ref 6–20)
CHLORIDE: 108 mmol/L (ref 101–111)
CO2: 19 mmol/L — ABNORMAL LOW (ref 22–32)
Calcium: 9.8 mg/dL (ref 8.9–10.3)
Creatinine, Ser: 1.87 mg/dL — ABNORMAL HIGH (ref 0.44–1.00)
GFR calc Af Amer: 29 mL/min — ABNORMAL LOW (ref >60)
GFR calc non Af Amer: 25 mL/min — ABNORMAL LOW (ref >60)
GLUCOSE: 167 mg/dL — AB (ref 65–99)
POTASSIUM: 4 mmol/L (ref 3.5–5.1)
Sodium: 142 mmol/L (ref 135–145)
Total Protein: 7.4 g/dL (ref 6.5–8.1)

## 2014-10-17 LAB — C-REACTIVE PROTEIN: CRP: 1.8 mg/dL — ABNORMAL HIGH (ref <1.0)

## 2014-10-17 LAB — TROPONIN I: Troponin I: <0.03 ng/mL (ref <0.031)

## 2014-10-17 LAB — LIPASE, BLOOD: Lipase: 41 U/L (ref 22–51)

## 2014-10-17 MED ORDER — ONDANSETRON HCL 4 MG/2ML IJ SOLN
4.0000 mg | Freq: Once | INTRAMUSCULAR | Status: AC
  Administered 2014-10-17: 4 mg via INTRAVENOUS

## 2014-10-17 MED ORDER — LAMOTRIGINE 100 MG PO TABS
100.0000 mg | ORAL_TABLET | Freq: Every day | ORAL | Status: DC
  Administered 2014-10-17 – 2014-10-21 (×4): 100 mg via ORAL
  Filled 2014-10-17 (×2): qty 4
  Filled 2014-10-17: qty 1
  Filled 2014-10-17: qty 4
  Filled 2014-10-17: qty 1
  Filled 2014-10-17: qty 4
  Filled 2014-10-17: qty 1

## 2014-10-17 MED ORDER — ACETAMINOPHEN 325 MG PO TABS: 650.0000 mg | ORAL_TABLET | Freq: Four times a day (QID) | ORAL | Status: DC | PRN

## 2014-10-17 MED ORDER — MORPHINE SULFATE 2 MG/ML IJ SOLN
2.0000 mg | Freq: Once | INTRAMUSCULAR | Status: AC
  Administered 2014-10-17: 2 mg via INTRAVENOUS

## 2014-10-17 MED ORDER — IOHEXOL 240 MG/ML SOLN
25.0000 mL | INTRAMUSCULAR | Status: AC
  Administered 2014-10-17: 25 mL via ORAL

## 2014-10-17 MED ORDER — METRONIDAZOLE IN NACL 5-0.79 MG/ML-% IV SOLN
500.0000 mg | Freq: Once | INTRAVENOUS | Status: DC
  Filled 2014-10-17: qty 100

## 2014-10-17 MED ORDER — ONDANSETRON HCL 4 MG/2ML IJ SOLN
INTRAMUSCULAR | Status: AC
  Administered 2014-10-17: 4 mg via INTRAVENOUS
  Filled 2014-10-17: qty 2

## 2014-10-17 MED ORDER — HYDROCODONE-ACETAMINOPHEN 5-325 MG PO TABS: 1.0000 | ORAL_TABLET | Freq: Four times a day (QID) | ORAL | Status: DC | PRN

## 2014-10-17 MED ORDER — HEPARIN SODIUM (PORCINE) 5000 UNIT/ML IJ SOLN
5000.0000 [IU] | Freq: Three times a day (TID) | INTRAMUSCULAR | Status: DC
  Administered 2014-10-17 – 2014-10-19 (×6): 5000 [IU] via SUBCUTANEOUS
  Filled 2014-10-17 (×6): qty 1

## 2014-10-17 MED ORDER — SENNOSIDES-DOCUSATE SODIUM 8.6-50 MG PO TABS: 1.0000 | ORAL_TABLET | Freq: Every evening | ORAL | Status: DC | PRN

## 2014-10-17 MED ORDER — LEVOTHYROXINE SODIUM 88 MCG PO TABS
88.0000 ug | ORAL_TABLET | ORAL | Status: DC
  Administered 2014-10-17 – 2014-10-22 (×5): 88 ug via ORAL
  Filled 2014-10-17 (×7): qty 1

## 2014-10-17 MED ORDER — ALPRAZOLAM 0.5 MG PO TABS
0.5000 mg | ORAL_TABLET | Freq: Every day | ORAL | Status: DC
  Administered 2014-10-17 – 2014-10-21 (×4): 0.5 mg via ORAL
  Filled 2014-10-17 (×6): qty 1

## 2014-10-17 MED ORDER — CIPROFLOXACIN IN D5W 400 MG/200ML IV SOLN
INTRAVENOUS | Status: AC
  Administered 2014-10-17: 400 mg via INTRAVENOUS
  Filled 2014-10-17: qty 200

## 2014-10-17 MED ORDER — CHOLECALCIFEROL 25 MCG (1000 UT) PO TABS: 1.0000 | ORAL_TABLET | Freq: Every day | ORAL | Status: DC

## 2014-10-17 MED ORDER — MORPHINE SULFATE 2 MG/ML IJ SOLN
INTRAMUSCULAR | Status: AC
  Administered 2014-10-17: 2 mg via INTRAVENOUS
  Filled 2014-10-17: qty 1

## 2014-10-17 MED ORDER — CYANOCOBALAMIN ER 1000 MCG PO TBCR: 1.0000 | EXTENDED_RELEASE_TABLET | Freq: Every day | ORAL | Status: DC

## 2014-10-17 MED ORDER — METRONIDAZOLE IN NACL 5-0.79 MG/ML-% IV SOLN
500.0000 mg | Freq: Three times a day (TID) | INTRAVENOUS | Status: DC
  Administered 2014-10-17 – 2014-10-19 (×7): 500 mg via INTRAVENOUS
  Filled 2014-10-17 (×10): qty 100

## 2014-10-17 MED ORDER — VITAMIN D3 25 MCG (1000 UNIT) PO TABS
1000.0000 [IU] | ORAL_TABLET | Freq: Every day | ORAL | Status: DC
Start: 2014-10-17 — End: 2014-10-22
  Administered 2014-10-17 – 2014-10-22 (×5): 1000 [IU] via ORAL
  Filled 2014-10-17 (×11): qty 1

## 2014-10-17 MED ORDER — CIPROFLOXACIN IN D5W 400 MG/200ML IV SOLN
400.0000 mg | INTRAVENOUS | Status: DC
  Administered 2014-10-18 – 2014-10-19 (×2): 400 mg via INTRAVENOUS
  Filled 2014-10-17 (×3): qty 200

## 2014-10-17 MED ORDER — SODIUM CHLORIDE 0.9 % IV SOLN
INTRAVENOUS | Status: DC
  Administered 2014-10-17 – 2014-10-20 (×7): via INTRAVENOUS

## 2014-10-17 MED ORDER — VITAMIN B-12 1000 MCG PO TABS
1000.0000 ug | ORAL_TABLET | Freq: Every day | ORAL | Status: DC
  Administered 2014-10-17 – 2014-10-22 (×5): 1000 ug via ORAL
  Filled 2014-10-17 (×6): qty 1

## 2014-10-17 MED ORDER — ACETAMINOPHEN 650 MG RE SUPP: 650.0000 mg | Freq: Four times a day (QID) | RECTAL | Status: DC | PRN

## 2014-10-17 MED ORDER — SODIUM CHLORIDE 0.9 % IV BOLUS (SEPSIS)
1000.0000 mL | Freq: Once | INTRAVENOUS | Status: AC
  Administered 2014-10-17: 1000 mL via INTRAVENOUS

## 2014-10-17 MED ORDER — PRAVASTATIN SODIUM 20 MG PO TABS
20.0000 mg | ORAL_TABLET | Freq: Every day | ORAL | Status: DC
  Administered 2014-10-17 – 2014-10-21 (×5): 20 mg via ORAL
  Filled 2014-10-17 (×6): qty 1

## 2014-10-17 MED ORDER — ONDANSETRON HCL 4 MG/2ML IJ SOLN
4.0000 mg | Freq: Four times a day (QID) | INTRAMUSCULAR | Status: DC | PRN
  Administered 2014-10-17 – 2014-10-20 (×8): 4 mg via INTRAVENOUS
  Filled 2014-10-17 (×8): qty 2

## 2014-10-17 MED ORDER — LATANOPROST 0.005 % OP SOLN
1.0000 [drp] | Freq: Every day | OPHTHALMIC | Status: DC
  Administered 2014-10-17 – 2014-10-21 (×5): 1 [drp] via OPHTHALMIC
  Filled 2014-10-17 (×2): qty 2.5

## 2014-10-17 MED ORDER — MORPHINE SULFATE 2 MG/ML IJ SOLN: 1.0000 mg | INTRAMUSCULAR | Status: DC | PRN

## 2014-10-17 MED ORDER — CIPROFLOXACIN IN D5W 400 MG/200ML IV SOLN
400.0000 mg | Freq: Two times a day (BID) | INTRAVENOUS | Status: DC
  Administered 2014-10-17: 400 mg via INTRAVENOUS
  Filled 2014-10-17 (×2): qty 200

## 2014-10-17 NOTE — Care Management Note (Signed)
Case Management Note  Patient Details  Name: Stephanie Anthony MRN: [id number] Date of Birth: 12-Feb-1934  Subjective/Objective:                  Spoke with patient who is alert and oriented. She stated that she has been fairly independent at home. Normally drives self and assists her sister who has health problems. Lives with sister and nephews in her home. Has a wlker but does not use it. No difficulty with medications. PCP Dr Netty Starring. Will continue to follow for changing needs at this point anticipate discharge to home with self care.  Action/Plan:   Expected Discharge Date:  10/19/14               Expected Discharge Plan:  Home/Self Care  In-House Referral:     Discharge planning Services  CM Consult  Post Acute Care Choice:    Choice offered to:  Patient  DME Arranged:    DME Agency:     HH Arranged:    [place] Agency:  [place]  Status of Service:     Medicare Important Message Given:    Date Medicare IM Given:    Medicare IM give by:    Date Additional Medicare IM Given:    Additional Medicare Important Message give by:     If discussed at [place] of Stay Meetings, dates discussed:    Additional Comments:  Alvie Heidelberg, RN 10/17/2014, 3:59 PM

## 2014-10-17 NOTE — ED Notes (Signed)
Patient transported to CT 

## 2014-10-17 NOTE — H&P (Signed)
[facility] [place] NAME: Stephanie Anthony    MR#:  [id number]  DATE OF BIRTH:  [birth year]  DATE OF ADMISSION:  10/17/2014  PRIMARY CARE PHYSICIAN: [name], MD   REQUESTING/REFERRING PHYSICIAN: DR JADE SUNG  CHIEF COMPLAINT:  abdominal pain and intractable vomiting since y'day  HISTORY OF PRESENT ILLNESS:  Stephanie Anthony  is a 81 y.o. female with a known history of bipolar disorder , hypertension, coronary artery disease, chronic mitral valve disease comes to the emergency room with increasing generalized abdominal pain since yesterday 10:00 in the night. Patient started with intractable nausea vomiting after lunchtime yesterday. She reports bilious vomiting. Denies any fever or dysuria headache. In the emergency room patient was found to be tachycardic, elevated white count of 15,000 and CT of the abdomen showing diffuse antritis multifocal levels in the small intestine. Patient was started on IV fluids. She received a dose of IV Cipro and Flagyl. She is being admitted for further evaluation of SIRS secondary to Enteritis.  PAST MEDICAL HISTORY:   Past Medical History  Diagnosis Date  . Bipolar 1 disorder   . Seasonal allergies   . Murmur, cardiac   . Coronary artery disease   . Chronic mitral valve disease   . Hypertension     PAST SURGICAL HISTOIRY:   Past Surgical History  Procedure Laterality Date  . Thyroidectomy    . Cholecystectomy     Coronary artery disease, chronic mitral valve disease, hypertension comes to the emergency room SOCIAL HISTORY:   History  Substance Use Topics  . Smoking status: Never Smoker   . Smokeless tobacco: Not on file  . Alcohol Use: No    FAMILY HISTORY:  History reviewed. No pertinent family history.  DRUG ALLERGIES:   Allergies  Allergen Reactions  . Aspirin Other (See Comments)    Reaction: Rectal bleeding  . Codeine Nausea And Vomiting  . Codeine Sulfate Nausea Only   . Prednisone Other (See Comments)    Reaction: Hallucinations  . Valium [Diazepam] Other (See Comments)    Reaction: Strange feelings    REVIEW OF SYSTEMS:  Review of Systems  Constitutional: Positive for malaise/fatigue. Negative for fever, chills and diaphoresis.  HENT: Negative for congestion, ear pain, hearing loss, nosebleeds and sore throat.   Eyes: Negative for blurred vision, double vision, photophobia and pain.  Respiratory: Negative for hemoptysis, sputum production, wheezing and stridor.   Cardiovascular: Negative for orthopnea, claudication and leg swelling.  Gastrointestinal: Positive for nausea, vomiting, abdominal pain and constipation. Negative for heartburn and diarrhea.  Genitourinary: Negative for dysuria and frequency.  Musculoskeletal: Negative for back pain, joint pain and neck pain.  Skin: Negative for rash.  Neurological: Positive for weakness. Negative for tingling, sensory change, speech change, focal weakness, seizures and headaches.  Endo/Heme/Allergies: Does not bruise/bleed easily.  Psychiatric/Behavioral: Negative for suicidal ideas, memory loss and substance abuse. The patient is not nervous/anxious.   All other systems reviewed and are negative.    MEDICATIONS AT HOME:   Prior to Admission medications   Medication Sig Start Date End Date Taking? Authorizing Provider  ALPRAZolam Duanne Moron) 0.5 MG tablet Take 0.5 mg by mouth at bedtime.  09/06/14  Yes Historical Provider, MD  Cholecalciferol 1000 UNITS tablet Take 1 tablet by mouth daily.   Yes Historical Provider, MD  Cyanocobalamin 1000 MCG TBCR Take 1 tablet by mouth daily.   Yes Historical Provider, MD  enalapril (VASOTEC) 2.5 MG tablet Take 1 tablet  by mouth daily. 08/15/14  Yes Historical Provider, MD  furosemide (LASIX) 20 MG tablet Take 1 tablet by mouth daily as needed. 08/15/14  Yes Historical Provider, MD  latanoprost (XALATAN) 0.005 % ophthalmic solution Place 1 drop into both eyes at bedtime.  08/29/14  Yes Historical Provider, MD  levothyroxine (SYNTHROID, LEVOTHROID) 88 MCG tablet Take 1 tablet by mouth daily. 09/14/14  Yes Historical Provider, MD  lovastatin (MEVACOR) 20 MG tablet Take 2 tablets by mouth daily.  08/29/14  Yes Historical Provider, MD  lamoTRIgine (LAMICTAL) 100 MG tablet Take 1 tablet by mouth at bedtime. Take along with 25mg  tablet to equal 125mg  daily. 09/07/14   Historical Provider, MD  lamoTRIgine (LAMICTAL) 25 MG tablet Take 1 tablet by mouth at bedtime. Take along with a 100mg  tablet to equal 125mg  daily. 07/24/14   Historical Provider, MD      VITAL SIGNS:  Blood pressure 116/59, pulse 70, temperature 98.2 F (36.8 C), temperature source Oral, resp. rate 20, height 5\' 7"  (1.702 m), weight 78.019 kg (172 lb), SpO2 98 %.  PHYSICAL EXAMINATION:  GENERAL:  81 y.o.-year-old patient lying in the bed with no acute distress.  EYES: Pupils equal, round, reactive to light and accommodation. No scleral icterus. Extraocular muscles intact.  HEENT: Head atraumatic, normocephalic. Oropharynx and nasopharynx clear. Oral mucosa dry NECK:  Supple, no jugular venous distention. No thyroid enlargement, no tenderness.  LUNGS: Normal breath sounds bilaterally, no wheezing, rales,rhonchi or crepitation. No use of accessory muscles of respiration.  CARDIOVASCULAR: S1, S2 normal. Tachycardia +,No murmurs, rubs, or gallops.  ABDOMEN: Soft,distended, tenderness diffuse,no guarding rigidity  EXTREMITIES: No pedal edema, cyanosis, or clubbing.  NEUROLOGIC: Cranial nerves II through XII are intact. Muscle strength 5/5 in all extremities. Sensation intact. Gait not checked.  PSYCHIATRIC: The patient is alert and oriented x 3.  SKIN: No obvious rash, lesion, or ulcer.   LABORATORY PANEL:   CBC  Recent Labs Lab 10/17/14 0340  WBC 15.7*  HGB 15.2  HCT 46.3  PLT 221    ------------------------------------------------------------------------------------------------------------------  Chemistries   Recent Labs Lab 10/17/14 0340  NA 142  K 4.0  CL 108  CO2 19*  GLUCOSE 167*  BUN 19  CREATININE 1.87*  CALCIUM 9.8  AST 24  ALT 12*  ALKPHOS 44  BILITOT 0.8   ------------------------------------------------------------------------------------------------------------------  Cardiac Enzymes  Recent Labs Lab 10/17/14 0340  TROPONINI <0.03   ------------------------------------------------------------------------------------------------------------------  RADIOLOGY:  Ct Abdomen Pelvis Wo Contrast  10/17/2014   CLINICAL DATA:  81 year old female with abdominal pain radiating into the back. Nausea and vomiting.  EXAM: CT ABDOMEN AND PELVIS WITHOUT CONTRAST  TECHNIQUE: Multidetector CT imaging of the abdomen and pelvis was performed following the standard protocol without IV contrast.  COMPARISON:  CT 07/24/2012, MRI 12/22/2013  FINDINGS: Mild emphysematous change at the lung bases. No consolidation. The heart size is normal.  Clips in the gallbladder fossa from cholecystectomy. No evidence of focal hepatic lesion allowing for lack contrast. There is a small amount of perihepatic free fluid. Punctate granuloma in the spleen. Spleen is normal in size. Mild pancreatic atrophy without ductal dilatation or inflammatory change. Kidneys are symmetric in size without hydronephrosis. Scattered renal parenchymal calcifications and cysts, similar in appearance to prior exam. There is no hydronephrosis.  Stomach is physiologically distended. Small hiatal hernia. There multifocal regions of small bowel thickening and adjacent inflammatory change. Moderate segment of small bowel inflammation in the left upper quadrant of the abdomen. Moderate length segment of diffuse small bowel  thickening in the right lower quadrant. The terminal ileum appears normal. There is  associated mesenteric edema related to the inflammatory process. No obstruction, oral contrast is seen throughout the colon. There scattered distal colonic diverticula without diverticulitis. No colonic wall thickening.  Proximal appendix is filled with oral contrast. Bullous fluid density structure involving the distal appendix, measuring up to 2.3 cm. There is mild soft tissue inflammation in the right lower quadrant, however this appears to be related to a small bowel inflammation rather than appendiceal inflammation. This appears increased in size from prior exam where it measured 19 mm. Mild mesenteric edema and multiple mesenteric lymph nodes.  No retroperitoneal adenopathy. Abdominal aorta is normal in caliber.  Within the pelvis the urinary bladder is physiologically distended. Uterus is slightly bulbous, may reflect underlying fibroids. The ovaries are not discretely identified. Small volume of free fluid in the pelvis.  There are no acute or suspicious osseous abnormalities. The bones appear under mineralized. There is degenerative change in the lower lumbar spine with primarily facet arthropathy. Degenerative change of both hips.  IMPRESSION: 1. Multifocal small bowel enteritis involving left upper and right lower quadrants. There is associated mesenteric edema and free fluid. Findings may be infectious or inflammatory. No perforation. 2. Bulbous fluid-filled dilatation of the distal appendix measuring up to 2.3 cm. This is progressed from prior exam. 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 Surgical consultation is recommended. 3. Chronic findings include postcholecystectomy, sequela of prior granulomatous disease in the spleen, and multiple renal cysts and calcifications.   Electronically Signed   By: Jeb Levering M.D.   On: 10/17/2014 07:04    EKG:   S tachycardia IMPRESSION AND PLAN:   81 year old [name] with history of  bipolar disorder, coronary artery disease, hypertension comes to the emergency room with increasing abdominal pain and intractable nausea vomiting she's being admitted with  #1 acute small bowel enteritis. We'll admit patient to MedSurg floor. Clear liquid diet. IV fluids for hydration. IV Cipro & Flagyl Follow-up WBC count Case was discussed with GI Dr. Gustavo Lah who will see patient in consultation.  #2 abnormal CT of the abdomen showing bulbous fluid-filled dilatation of distal appendix which has progressed from prior exam. We'll get surgical opinion.  #3 hypertension Patient currently has relative hypotension I will hold off on enalapril and and Lasix.  #4 history of bipolar disorder with psychotic features Patient appears calm and cooperative We'll continue her Lamictal once appropriate dose is obtained by pharmacy tech. Consider psychiatry consultation if needed.  #5 intractable nausea vomiting with dehydration due to acute renal failure Clear liquid diet IV fluids for hydration Monitor I's and O and creatinine  #6 DVT prophylaxis subcutaneous heparin  No family members present emergency room. admission plan was discussed with patient she is agreeable to it.  Case d/w dr Gustavo Lah    All the records are reviewed and case discussed with ED provider. Management plans discussed with the patient, family and they are in agreement.  CODE STATUS:FULL  TOTAL TIME TAKING CARE OF THIS PATIENT: 50 minutes.    Gwenevere Goga M.D on 10/17/2014 at 7:46 AM  Between 7am to 6pm - Pager - [phone]  After 6pm go to [email or web address] - password [email or web address] [facility]  Office  [phone]  CC: Primary care physician; [name], MD

## 2014-10-17 NOTE — ED Notes (Signed)
Pt to triage via w/c, actively dry heaving & tearful; pt reports since yesterday having severe abd pain radiating into back and midsternum accomp by N/V

## 2014-10-17 NOTE — Progress Notes (Signed)
ANTIBIOTIC CONSULT NOTE - INITIAL  Pharmacy Consult for Renal adjustment for Ciprofloxacin Indication: intra-abdominal infection  Allergies  Allergen Reactions  . Aspirin Other (See Comments)    Reaction: Rectal bleeding  . Codeine Nausea And Vomiting  . Codeine Sulfate Nausea Only  . Prednisone Other (See Comments)    Reaction: Hallucinations  . Valium [Diazepam] Other (See Comments)    Reaction: Strange feelings    Patient Measurements: Height: 5\' 7"  (170.2 cm) Weight: 172 lb (78.019 kg) IBW/kg (Calculated) : 61.6  Vital Signs: Temp: 98.6 F (37 C) (05/31 0852) Temp Source: Oral (05/31 0852) BP: 123/57 mmHg (05/31 0852) Pulse Rate: 65 (05/31 0852) Intake/Output from previous day:   Intake/Output from this shift: Total I/O In: 860 [I.V.:660; IV Piggyback:200] Out: 350 [Urine:350]  Labs:  Recent Labs  10/17/14 0340  WBC 15.7*  HGB 15.2  PLT 221  CREATININE 1.87*   Estimated Creatinine Clearance: 26.7 mL/min (by C-G formula based on Cr of 1.87). No results for input(s): VANCOTROUGH, VANCOPEAK, VANCORANDOM, GENTTROUGH, GENTPEAK, GENTRANDOM, TOBRATROUGH, TOBRAPEAK, TOBRARND, AMIKACINPEAK, AMIKACINTROU, AMIKACIN in the last 72 hours.   Microbiology: No results found for this or any previous visit (from the past 720 hour(s)).  Medical History: Past Medical History  Diagnosis Date  . Bipolar 1 disorder   . Seasonal allergies   . Murmur, cardiac   . Coronary artery disease   . Chronic mitral valve disease   . Hypertension     Medications:  Scheduled:  . ALPRAZolam  0.5 mg Oral QHS  . cholecalciferol  1,000 Units Oral Daily  . [START ON 10/18/2014] ciprofloxacin  400 mg Intravenous Q24H  . heparin  5,000 Units Subcutaneous 3 times per day  . lamoTRIgine  100 mg Oral QHS  . latanoprost  1 drop Both Eyes QHS  . levothyroxine  88 mcg Oral BH-q7a  . metronidazole  500 mg Intravenous Q8H  . pravastatin  20 mg Oral q1800  . vitamin B-12  1,000 mcg Oral Daily    Assessment: Patient is a 81 yo female admitted for treatment of possible enteritis.  Patient ordered Ciprofloxacin 400 mg IV q12h and Metronidazole 500 mg IV q8h.  Goal of Therapy:  resolution of infection  renal adjustment of ciprofloxacin based on CrCl  Plan:  Will transition patient to Ciprofloxacin 400 mg IV q24h based on est CrCl~26.7 mL/min.  Will continue to follow and adjust per consult.   Murrell Converse, PharmD Clinical Pharmacist 10/17/2014

## 2014-10-17 NOTE — Consult Note (Signed)
Patient ID: Stephanie Anthony, female   DOB: August 21, 1933, 81 y.o.   MRN: [id number]  HPI Stephanie Anthony is a 81 y.o. female admitted through the emergency room with abdominal pain. She has been having significant abdominal discomfort for approximately 2436 hrs. She describes the pain as crampy in nature with episodes lasting 2-3 minutes followed by a 5-10 minutes of pain-free interval. Pain is worsened by eating. She feels as though she's having trouble moving her bowels. She denies any vomiting but she has been nauseated over the last couple of days. She presented emergency room for further evaluation. Her white blood cell count was slightly elevated at 15,000 and she was mildly dehydrated. CT scan demonstrated what appeared to be significant enteritis in the small intestine but enlarged appendix suggestive of possible mucocele versus primary carcinoma. Surgical service was consulted.  She has recent history of psychiatric evaluation for suicidal ideation in a suicide attempt. She has history of bipolar disorder in addition.  She has no other significant GI history. She did have a previous colonoscopy 2 years ago which revealed a small polyp. She's not had any hepatitis yellow jaundice pancreatitis peptic ulcer disease or diverticulitis. She has had a previous cholecystectomy. The surgical service is consulted for evaluation of the appendiceal abnormality on her CT scan.  HPI  Past Medical History  Diagnosis Date  . Bipolar 1 disorder   . Seasonal allergies   . Murmur, cardiac   . Coronary artery disease   . Chronic mitral valve disease   . Hypertension     Past Surgical History  Procedure Laterality Date  . Thyroidectomy    . Cholecystectomy      Family History  Problem Relation Age of Onset  . Heart Problems Mother   . Seizures Mother   . Stroke Mother   . Diverticulitis Mother   . Heart attack Father   . Heart attack Sister   . Pancreatitis Sister   . Cancer Sister   . Cancer Brother      Social History History  Substance Use Topics  . Smoking status: Never Smoker   . Smokeless tobacco: Not on file  . Alcohol Use: No    Allergies  Allergen Reactions  . Aspirin Other (See Comments)    Reaction: Rectal bleeding  . Codeine Nausea And Vomiting  . Codeine Sulfate Nausea Only  . Prednisone Other (See Comments)    Reaction: Hallucinations  . Valium [Diazepam] Other (See Comments)    Reaction: Strange feelings    Current Facility-Administered Medications  Medication Dose Route Frequency Provider Last Rate Last Dose  . 0.9 %  sodium chloride infusion   Intravenous Continuous Fritzi Mandes, MD 125 mL/hr at 10/17/14 0750    . acetaminophen (TYLENOL) tablet 650 mg  650 mg Oral Q6H PRN Fritzi Mandes, MD       Or  . acetaminophen (TYLENOL) suppository 650 mg  650 mg Rectal Q6H PRN Fritzi Mandes, MD      . ALPRAZolam Duanne Moron) tablet 0.5 mg  0.5 mg Oral QHS Fritzi Mandes, MD      . cholecalciferol (VITAMIN D) tablet 1,000 Units  1,000 Units Oral Daily Fritzi Mandes, MD   1,000 Units at 10/17/14 1141  . [START ON 10/18/2014] ciprofloxacin (CIPRO) IVPB 400 mg  400 mg Intravenous Q24H Fritzi Mandes, MD      . heparin injection 5,000 Units  5,000 Units Subcutaneous 3 times per day Fritzi Mandes, MD   5,000 Units at 10/17/14 1056  .  HYDROcodone-acetaminophen (NORCO/VICODIN) 5-325 MG per tablet 1-2 tablet  1-2 tablet Oral Q6H PRN Fritzi Mandes, MD      . lamoTRIgine (LAMICTAL) tablet 100 mg  100 mg Oral QHS Fritzi Mandes, MD      . latanoprost (XALATAN) 0.005 % ophthalmic solution 1 drop  1 drop Both Eyes QHS Fritzi Mandes, MD      . levothyroxine (SYNTHROID, LEVOTHROID) tablet 88 mcg  88 mcg Oral Weber Cooks, MD   88 mcg at 10/17/14 1141  . metroNIDAZOLE (FLAGYL) IVPB 500 mg  500 mg Intravenous Q8H Fritzi Mandes, MD   500 mg at 10/17/14 1224  . morphine 2 MG/ML injection 1 mg  1 mg Intravenous Q3H PRN Fritzi Mandes, MD      . pravastatin (PRAVACHOL) tablet 20 mg  20 mg Oral q1800 Fritzi Mandes, MD      .  senna-docusate (Senokot-S) tablet 1 tablet  1 tablet Oral QHS PRN Fritzi Mandes, MD      . vitamin B-12 (CYANOCOBALAMIN) tablet 1,000 mcg  1,000 mcg Oral Daily Fritzi Mandes, MD   1,000 mcg at 10/17/14 1141     Review of Systems A 10 point review of systems was asked and was negative except for the following positive findings noted above. She does have history of symptomatic bradycardia.  Physical Exam Blood pressure 123/57, pulse 65, temperature 98.6 F (37 C), temperature source Oral, resp. rate 18, height 5\' 7"  (1.702 m), weight 78.019 kg (172 lb), SpO2 98 %. CONSTITUTIONAL she is alert cooperative but clearly with a flat affect and obviously depressed. She is in no acute pain EYES: Pupils are equal, round, and reactive to light, Sclera are non-icteric. EARS, NOSE, MOUTH AND THROAT: The oropharynx is clear. The oral mucosa is pink and moist. Hearing is intact to voice. LYMPH NODES:  Lymph nodes in the neck are normal. RESPIRATORY:  Lungs are clear. There is normal respiratory effort, with equal breath sounds bilaterally, and without pathologic use of accessory muscles. CARDIOVASCULAR: Heart is regular without murmurs, gallops, or rubs. GI: The abdomen is  Soft and nondistended. There are no palpable masses. There is no hepatosplenomegaly. There are normal bowel sounds in all quadrants. She has mild generalized abdominal tenderness with no significant rebound or guarding GU: Rectal deferred.   MUSCULOSKELETAL: Normal muscle strength and tone. No cyanosis or edema.   SKIN: Turgor is good and there are no pathologic skin lesions or ulcers. NEUROLOGIC: Motor and sensation is grossly normal. Cranial nerves are grossly intact. PSYCH:  Oriented to person, place and time. Affect is quite depressed and flat..  Data Reviewed I then thoroughly reviewed her CT scan I have personally reviewed the patient's imaging, laboratory findings and medical records.    Assessment    Findings consistent with  possible small bowel enteritis which I suspect is the etiology of her current symptoms. She does not carry any history of inflammatory bowel disease and it would be unusual to be in her 62s to make the diagnosis of that problem. However I do not see any obvious surgical indication.  The appendiceal changes are significant and could represent a mucocele versus a primary adenocarcinoma. With fluid-filled appendix I would lean toward a mucocele.    Plan    I would agree with her current therapy using antibiotics rehydration. Her mental status is clearly depressed. At 80 indications for surgery are somewhat less distinct from mucocele. We will get her over the current episode and then make a decision with regard  to elective appendectomy to rule out a more malignant condition. We will continue to follow her while she is hospitalized.     Time spent with the patient was 50 minutes, with more than 50% of the time spent in face-to-face education, counseling and care coordination.     Dia Crawford III 10/17/2014, 1:10 PM

## 2014-10-17 NOTE — Consult Note (Signed)
Subjective: Patient seen for abdominal pain and abnormal CT scan. Patient seen and examined please see full GI consult by Mrs. Jerelene Redden.  Patient presented to the hospital with nausea and vomiting well as epigastric pain that extended to and including the umbilicus. She states she had no previous history of this problem. In 01/28/2010 she had both an EGD and colonoscopy, the EGD showing normal, the colonoscopy showing diverticulosis. He has never apparently had diverticulitis. CT scan as noted below with some segmental enteritis.  Objective: Vital signs in last 24 hours: Temp:  [98.2 F (36.8 C)-98.6 F (37 C)] 98.4 F (36.9 C) (05/31 1950) Pulse Rate:  [58-129] 59 (05/31 1955) Resp:  [11-20] 17 (05/31 1955) BP: (99-145)/(40-108) 108/51 mmHg (05/31 1955) SpO2:  [96 %-100 %] 98 % (05/31 1955) Weight:  [78.019 kg (172 lb)] 78.019 kg (172 lb) (05/31 0333) Blood pressure 108/51, pulse 59, temperature 98.4 F (36.9 C), temperature source Oral, resp. rate 17, height 5\' 7"  (1.702 m), weight 78.019 kg (172 lb), SpO2 98 %.   Intake/Output from previous day:    Intake/Output this shift:     General appearance:  Elderly female no acute distress. Patient states there her abdominal pain is much improved Resp:  Clear to auscultation Cardio:  Regular rate and rhythm GI:  Soft mild discomfort to palpation in the epigastric region otherwise soft and nontender no masses or rebound or organomegaly. Extremities:  No clubbing or cyanosis   Lab Results: Results for orders placed or performed during the hospital encounter of 10/17/14 (from the past 24 hour(s))  CBC with Differential     Status: Abnormal   Collection Time: 10/17/14  3:40 AM  Result Value Ref Range   WBC 15.7 (H) 3.6 - 11.0 K/uL   RBC 5.80 (H) 3.80 - 5.20 MIL/uL   Hemoglobin 15.2 12.0 - 16.0 g/dL   HCT 46.3 35.0 - 47.0 %   MCV 79.8 (L) 80.0 - 100.0 fL   MCH 26.2 26.0 - 34.0 pg   MCHC 32.8 32.0 - 36.0 g/dL   RDW 14.2 11.5 - 14.5 %    Platelets 221 150 - 440 K/uL   Neutrophils Relative % 71 %   Neutro Abs 11.1 (H) 1.4 - 6.5 K/uL   Lymphocytes Relative 18 %   Lymphs Abs 2.9 1.0 - 3.6 K/uL   Monocytes Relative 10 %   Monocytes Absolute 1.6 (H) 0.2 - 0.9 K/uL   Eosinophils Relative 1 %   Eosinophils Absolute 0.2 0 - 0.7 K/uL   Basophils Relative 0 %   Basophils Absolute 0.0 0 - 0.1 K/uL  Comprehensive metabolic panel     Status: Abnormal   Collection Time: 10/17/14  3:40 AM  Result Value Ref Range   Sodium 142 135 - 145 mmol/L   Potassium 4.0 3.5 - 5.1 mmol/L   Chloride 108 101 - 111 mmol/L   CO2 19 (L) 22 - 32 mmol/L   Glucose, Bld 167 (H) 65 - 99 mg/dL   BUN 19 6 - 20 mg/dL   Creatinine, Ser 1.87 (H) 0.44 - 1.00 mg/dL   Calcium 9.8 8.9 - 10.3 mg/dL   Total Protein 7.4 6.5 - 8.1 g/dL   Albumin 4.3 3.5 - 5.0 g/dL   AST 24 15 - 41 U/L   ALT 12 (L) 14 - 54 U/L   Alkaline Phosphatase 44 38 - 126 U/L   Total Bilirubin 0.8 0.3 - 1.2 mg/dL   GFR calc non Af Amer 25 (L) >60  mL/min   GFR calc Af Amer 29 (L) >60 mL/min   Anion gap 15 5 - 15  Lipase, blood     Status: None   Collection Time: 10/17/14  3:40 AM  Result Value Ref Range   Lipase 41 22 - 51 U/L  Troponin I     Status: None   Collection Time: 10/17/14  3:40 AM  Result Value Ref Range   Troponin I <0.03 <0.031 ng/mL  Sedimentation rate     Status: None   Collection Time: 10/17/14  3:40 AM  Result Value Ref Range   Sed Rate  0 - 22 mm/hr    ORDERED TEST(S) CANNOT BE PERFORMED DUE TO AGE OF SPECIMEN  Urinalysis complete, with microscopic ([facility] only)     Status: Abnormal   Collection Time: 10/17/14  7:30 AM  Result Value Ref Range   Color, Urine STRAW (A) YELLOW   APPearance CLEAR (A) CLEAR   Glucose, UA NEGATIVE NEGATIVE mg/dL   Bilirubin Urine NEGATIVE NEGATIVE   Ketones, ur TRACE (A) NEGATIVE mg/dL   Specific Gravity, Urine 1.004 (L) 1.005 - 1.030   Hgb urine dipstick 1+ (A) NEGATIVE   pH 6.0 5.0 - 8.0   Protein, ur NEGATIVE NEGATIVE mg/dL    Nitrite NEGATIVE NEGATIVE   Leukocytes, UA 2+ (A) NEGATIVE   RBC / HPF 0-5 0 - 5 RBC/hpf   WBC, UA 6-30 0 - 5 WBC/hpf   Bacteria, UA NONE SEEN NONE SEEN   Squamous Epithelial / LPF 0-5 (A) NONE SEEN   Mucous PRESENT   Sedimentation rate     Status: None   Collection Time: 10/17/14  4:59 PM  Result Value Ref Range   Sed Rate 12 0 - 30 mm/hr      Recent Labs  10/17/14 0340  WBC 15.7*  HGB 15.2  HCT 46.3  PLT 221   BMET  Recent Labs  10/17/14 0340  NA 142  K 4.0  CL 108  CO2 19*  GLUCOSE 167*  BUN 19  CREATININE 1.87*  CALCIUM 9.8   LFT  Recent Labs  10/17/14 0340  PROT 7.4  ALBUMIN 4.3  AST 24  ALT 12*  ALKPHOS 44  BILITOT 0.8   PT/INR No results for input(s): LABPROT, INR in the last 72 hours. Hepatitis Panel No results for input(s): HEPBSAG, HCVAB, HEPAIGM, HEPBIGM in the last 72 hours. C-Diff No results for input(s): CDIFFTOX in the last 72 hours. No results for input(s): CDIFFPCR in the last 72 hours.   Studies/Results: Ct Abdomen Pelvis Wo Contrast  10/17/2014   CLINICAL DATA:  81 year old female with abdominal pain radiating into the back. Nausea and vomiting.  EXAM: CT ABDOMEN AND PELVIS WITHOUT CONTRAST  TECHNIQUE: Multidetector CT imaging of the abdomen and pelvis was performed following the standard protocol without IV contrast.  COMPARISON:  CT 07/24/2012, MRI 12/22/2013  FINDINGS: Mild emphysematous change at the lung bases. No consolidation. The heart size is normal.  Clips in the gallbladder fossa from cholecystectomy. No evidence of focal hepatic lesion allowing for lack contrast. There is a small amount of perihepatic free fluid. Punctate granuloma in the spleen. Spleen is normal in size. Mild pancreatic atrophy without ductal dilatation or inflammatory change. Kidneys are symmetric in size without hydronephrosis. Scattered renal parenchymal calcifications and cysts, similar in appearance to prior exam. There is no hydronephrosis.  Stomach is  physiologically distended. Small hiatal hernia. There multifocal regions of small bowel thickening and adjacent inflammatory change. Moderate segment  of small bowel inflammation in the left upper quadrant of the abdomen. Moderate length segment of diffuse small bowel thickening in the right lower quadrant. The terminal ileum appears normal. There is associated mesenteric edema related to the inflammatory process. No obstruction, oral contrast is seen throughout the colon. There scattered distal colonic diverticula without diverticulitis. No colonic wall thickening.  Proximal appendix is filled with oral contrast. Bullous fluid density structure involving the distal appendix, measuring up to 2.3 cm. There is mild soft tissue inflammation in the right lower quadrant, however this appears to be related to a small bowel inflammation rather than appendiceal inflammation. This appears increased in size from prior exam where it measured 19 mm. Mild mesenteric edema and multiple mesenteric lymph nodes.  No retroperitoneal adenopathy. Abdominal aorta is normal in caliber.  Within the pelvis the urinary bladder is physiologically distended. Uterus is slightly bulbous, may reflect underlying fibroids. The ovaries are not discretely identified. Small volume of free fluid in the pelvis.  There are no acute or suspicious osseous abnormalities. The bones appear under mineralized. There is degenerative change in the lower lumbar spine with primarily facet arthropathy. Degenerative change of both hips.  IMPRESSION: 1. Multifocal small bowel enteritis involving left upper and right lower quadrants. There is associated mesenteric edema and free fluid. Findings may be infectious or inflammatory. No perforation. 2. Bulbous fluid-filled dilatation of the distal appendix measuring up to 2.3 cm. This is progressed from prior exam. 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 Surgical  consultation is recommended. 3. Chronic findings include postcholecystectomy, sequela of prior granulomatous disease in the spleen, and multiple renal cysts and calcifications.   Electronically Signed   By: Jeb Levering M.D.   On: 10/17/2014 07:04    Scheduled Inpatient Medications:   . ALPRAZolam  0.5 mg Oral QHS  . cholecalciferol  1,000 Units Oral Daily  . [START ON 10/18/2014] ciprofloxacin  400 mg Intravenous Q24H  . heparin  5,000 Units Subcutaneous 3 times per day  . lamoTRIgine  100 mg Oral QHS  . latanoprost  1 drop Both Eyes QHS  . levothyroxine  88 mcg Oral BH-q7a  . metronidazole  500 mg Intravenous Q8H  . pravastatin  20 mg Oral q1800  . vitamin B-12  1,000 mcg Oral Daily    Continuous Inpatient Infusions:   . sodium chloride 125 mL/hr at 10/17/14 1704    PRN Inpatient Medications:  acetaminophen **OR** acetaminophen, HYDROcodone-acetaminophen, morphine injection, ondansetron (ZOFRAN) IV, senna-docusate  Miscellaneous:   Assessment:  1. Abdominal pain with abnormal CT finding of a segmental enteritis. I'm also concerned of a finding in the stomach and what appears to be some narrowing the antrum itself with thickening of the wall of the stomach in that region. Patient is currently feeling somewhat better after having started antibody excess noted. Fragile diagnosis would include inflammatory as well as infective etiologies. Much less likely ischemic.  Plan:  1. The current antibiotics as you are 2. WILL OBTAIN MARKERS/SED RATE AND crp.  3. Consider egd depending on clinical improvement.  Following.  Appreciate surgery and IM.   [name] MD 10/17/2014, 8:30 PM

## 2014-10-17 NOTE — ED Notes (Signed)
Pt with increased nausea and clear yellow emesis. This is 20 min post zofran and morphine. Dr Posey Pronto paged.

## 2014-10-17 NOTE — ED Provider Notes (Signed)
[facility] Emergency Department Provider Note  ____________________________________________  Time seen: Approximately 4:05 AM  I have reviewed the triage vital signs and the nursing notes.   HISTORY  Chief Complaint Abdominal Pain    HPI Stephanie Anthony is a 81 y.o. female who presents with a one-day history of abdominal pain and vomiting. Patient describes 10/10 gradual onset, periumbilical and lower abdominal pain approximately noon yesterdayradiating to chest and back. Patient describes 3 episodes of emesis. Patient denies diarrhea. Last bowel movement 2 days ago which is normal for patient. Nothing makes the pain better or worse. Patient has not been eating due to lack of appetite secondary to pain. Patient denies fever, chills, dysuria, headache, weakness, numbness, tingling.   Past Medical History  Diagnosis Date  . Bipolar 1 disorder   . Seasonal allergies   . Murmur, cardiac   . Coronary artery disease   . Chronic mitral valve disease   . Hypertension     Patient Active Problem List   Diagnosis Date Noted  . Bipolar disorder, current episode depressed, severe, without psychotic features   . Bipolar affective disorder, current episode depressed 09/25/2014    Past Surgical History  Procedure Laterality Date  . Thyroidectomy    . Cholecystectomy      Current Outpatient Rx  Name  Route  Sig  Dispense  Refill  . ALPRAZolam (XANAX) 0.5 MG tablet   Oral   Take 0.25-0.5 mg by mouth 3 (three) times daily. Take 0.5mg  twice daily and 0.25mg  at bedtime.         . Cholecalciferol 1000 UNITS tablet   Oral   Take 1 tablet by mouth daily.         . Cyanocobalamin 1000 MCG TBCR   Oral   Take 1 tablet by mouth daily.         . enalapril (VASOTEC) 2.5 MG tablet   Oral   Take 1 tablet by mouth daily.         . furosemide (LASIX) 20 MG tablet   Oral   Take 1 tablet by mouth daily as needed.         . latanoprost (XALATAN) 0.005 %  ophthalmic solution   Both Eyes   Place 1 drop into both eyes at bedtime.         [name] levothyroxine (SYNTHROID, LEVOTHROID) 88 MCG tablet   Oral   Take 1 tablet by mouth daily.         [name] lovastatin (MEVACOR) 20 MG tablet   Oral   Take 1 tablet by mouth daily.         [name] lamoTRIgine (LAMICTAL) 100 MG tablet   Oral   Take 1 tablet by mouth at bedtime. Take along with 25mg  tablet to equal 125mg  daily.         [name] lamoTRIgine (LAMICTAL) 25 MG tablet   Oral   Take 1 tablet by mouth at bedtime. Take along with a 100mg  tablet to equal 125mg  daily.           Allergies Aspirin; Codeine; Codeine sulfate; Prednisone; and Valium  History reviewed. No pertinent family history.  Social History History  Substance Use Topics  . Smoking status: Never Smoker   . Smokeless tobacco: Not on file  . Alcohol Use: No    Review of Systems Constitutional: No fever/chills Eyes: No visual changes. ENT: No sore throat. Cardiovascular: Positive for chest pain. Respiratory: Denies shortness of breath. Gastrointestinal: Positive for abdominal pain,  nausea and vomiting. No diarrhea.  No constipation. Genitourinary: Negative for dysuria. Musculoskeletal: Positive for radiating back pain. Skin: Negative for rash. Neurological: Negative for headaches, focal weakness or numbness.  10-point ROS otherwise negative.  ____________________________________________   PHYSICAL EXAM:  VITAL SIGNS: ED Triage Vitals  Enc Vitals Group     BP 10/17/14 0333 132/108 mmHg     Pulse Rate 10/17/14 0333 129     Resp --      Temp 10/17/14 0333 98.2 F (36.8 C)     Temp Source 10/17/14 0333 Oral     SpO2 10/17/14 0333 97 %     Weight 10/17/14 0333 172 lb (78.019 kg)     Height 10/17/14 0333 5\' 7"  (1.702 m)     Head Cir --      Peak Flow --      Pain Score 10/17/14 0333 10     Pain Loc --      Pain Edu? --      Excl. in [place]? --     Constitutional: Alert and oriented. Ill-appearing and in mild  acute distress. Eyes: Conjunctivae are normal. PERRL. EOMI. Head: Atraumatic. Nose: No congestion/rhinnorhea. Mouth/Throat: Mucous membranes are mildly dry.  Oropharynx non-erythematous. Neck: No stridor.   Cardiovascular: Normal rate, regular rhythm. Grossly normal heart sounds.  Good peripheral circulation. Respiratory: Normal respiratory effort.  No retractions. Lungs CTAB. Gastrointestinal: Soft, tender to palpation bilateral lower quadrants, left greater than right. No distention. No abdominal bruits. No CVA tenderness. Musculoskeletal: No lower extremity tenderness nor edema.  No joint effusions. Neurologic:  Normal speech and language. No gross focal neurologic deficits are appreciated. Speech is normal. Gait not tested. Skin:  Skin is warm, dry and intact. No rash noted. Psychiatric: Mood and affect are normal. Speech and behavior are normal.  ____________________________________________   LABS (all labs ordered are listed, but only abnormal results are displayed)  Labs Reviewed  CBC WITH DIFFERENTIAL/PLATELET - Abnormal; Notable for the following:    WBC 15.7 (*)    RBC 5.80 (*)    MCV 79.8 (*)    Neutro Abs 11.1 (*)    Monocytes Absolute 1.6 (*)    All other components within normal limits  COMPREHENSIVE METABOLIC PANEL - Abnormal; Notable for the following:    CO2 19 (*)    Glucose, Bld 167 (*)    Creatinine, Ser 1.87 (*)    ALT 12 (*)    GFR calc non Af Amer 25 (*)    GFR calc Af Amer 29 (*)    All other components within normal limits  LIPASE, BLOOD  TROPONIN I  URINALYSIS COMPLETEWITH MICROSCOPIC ([facility] ONLY)   ____________________________________________  EKG  ED ECG REPORT I, SUNG,JADE J, the attending physician, personally viewed and interpreted this ECG.   Date: 10/17/2014  EKG Time: 0338  Rate: 121  Rhythm: sinus tachycardia  Axis: Normal  Intervals:none  ST&T Change: Nonspecific  ____________________________________________  RADIOLOGY  CT  results pending at the time of shift change. ____________________________________________   PROCEDURES  Procedure(s) performed: None  Critical Care performed: No  ____________________________________________   INITIAL IMPRESSION / ASSESSMENT AND PLAN / ED COURSE  Pertinent labs & imaging results that were available during my care of the patient were reviewed by me and considered in my medical decision making (see chart for details).  81 year old female with abdominal pain, nausea and vomiting concerning for acute diverticulitis. Heart rate currently 100. Plan for IV analgesia and CT abdomen/pelvis. Noted low GFR on  labs earlier this month; will obtain CT with oral contrast only.  ----------------------------------------- 6:59 AM on 10/17/2014 -----------------------------------------  Patient improved after morphine. Pulse rate 70s, blood pressure normalized. Awaiting results of CT scan and urinalysis. Care transferred to Dr. Corky Downs. Disposition dependent on CT results. ____________________________________________   FINAL CLINICAL IMPRESSION(S) / ED DIAGNOSES  Final diagnoses:  Lower abdominal pain  Non-intractable vomiting with nausea, vomiting of unspecified type      Paulette Blanch, MD 10/17/14 [phone]

## 2014-10-17 NOTE — Consult Note (Signed)
Consultation  Referring Provider: Dr. Fritzi Mandes  Primary Care Physician:  [name], MD Consulting  Gastroenterologist: Dr. Loistine Simas        Reason for Consultation: Abdominal pain, abnormal CT            HPI:   Stephanie Anthony is a 81 y.o. female with a known history of bipolar disorder, hypertension, coronary artery disease, chronic mitral valve disease comes to the emergency room with increasing generalized abdominal pain since yesterday 10:00 in the night. Patient started with intractable nausea vomiting after lunchtime yesterday.    She reports the pain was all over the abdomen from the ribs all the way down to the lower abdomen-generalized. It is  the worst in the epigastric area and umbilical region.  The vomiting  occurred about 6 times yesterday, green bile.  Food and drink  made her stomach pain and vomiting worse.   She has never experienced anything like this in the past. She has a history of cholecystectomy.    She had a previous colonoscopy 01/28/2010 for follow-up colon polyps and weight loss.  The study showed diverticulosis.  She had an upper endoscopy at same time and that was totally normal.   She presented to the emergency room, and was found to be tachycardic, elevated [place]BC of 75643, CT of the abdomen showed diffuse enteritis an abnormal appendix showing mucocele versus primary adenocarcinoma.  She received IV Flagyl and Cipro.  The patient has felt no better during this admission.  She started vomiting during the interview and was started on IV Zosyn.  The emesis was a greenish non bloody.  Dr. Pat Patrick from surgery evaluated her and plans to get her over the acute episode and then make decision on whether an elective appendectomy is necessary to rule out a malignant condition.   Past Medical History  Diagnosis Date  . Bipolar 1 disorder   . Seasonal allergies   . Murmur, cardiac   . Coronary artery disease   . Chronic mitral valve disease   . Hypertension      Past Surgical History  Procedure Laterality Date  . Thyroidectomy    . Cholecystectomy      Family History  Problem Relation Age of Onset  . Heart Problems Mother   . Seizures Mother   . Stroke Mother   . Diverticulitis Mother   . Heart attack Father   . Heart attack Sister   . Pancreatitis Sister   . Cancer Sister   . Cancer Brother      History  Substance Use Topics  . Smoking status: Never Smoker   . Smokeless tobacco: Not on file  . Alcohol Use: No    Prior to Admission medications   Medication Sig Start Date End Date Taking? Authorizing Provider  ALPRAZolam Duanne Moron) 0.5 MG tablet Take 0.5 mg by mouth at bedtime.  09/06/14  Yes Historical Provider, MD  Cholecalciferol 1000 UNITS tablet Take 1 tablet by mouth daily.   Yes Historical Provider, MD  enalapril (VASOTEC) 2.5 MG tablet Take 1 tablet by mouth daily. 08/15/14  Yes Historical Provider, MD  furosemide (LASIX) 20 MG tablet Take 1 tablet by mouth daily as needed. 08/15/14  Yes Historical Provider, MD  lamoTRIgine (LAMICTAL) 100 MG tablet Take 1 tablet by mouth at bedtime.  09/07/14  Yes Historical Provider, MD  latanoprost (XALATAN) 0.005 % ophthalmic solution Place 1 drop into both eyes at bedtime. 08/29/14  Yes Historical Provider, MD  levothyroxine (SYNTHROID, [place]) 88  MCG tablet Take 1 tablet by mouth daily. 09/14/14  Yes Historical Provider, MD  lovastatin (MEVACOR) 20 MG tablet Take 2 tablets by mouth daily.  08/29/14  Yes Historical Provider, MD  vitamin B-12 (CYANOCOBALAMIN) 1000 MCG tablet Take 1,000 mcg by mouth daily.   Yes Historical Provider, MD    Current Facility-Administered Medications  Medication Dose Route Frequency Provider Last Rate Last Dose  . 0.9 %  sodium chloride infusion   Intravenous Continuous Fritzi Mandes, MD 125 mL/hr at 10/17/14 0750    . acetaminophen (TYLENOL) tablet 650 mg  650 mg Oral Q6H PRN Fritzi Mandes, MD       Or  . acetaminophen (TYLENOL) suppository 650 mg  650 mg Rectal  Q6H PRN Fritzi Mandes, MD      . ALPRAZolam Duanne Moron) tablet 0.5 mg  0.5 mg Oral QHS Fritzi Mandes, MD      . cholecalciferol (VITAMIN D) tablet 1,000 Units  1,000 Units Oral Daily Fritzi Mandes, MD   1,000 Units at 10/17/14 1141  . [START ON 10/18/2014] ciprofloxacin (CIPRO) IVPB 400 mg  400 mg Intravenous Q24H Fritzi Mandes, MD      . heparin injection 5,000 Units  5,000 Units Subcutaneous 3 times per day Fritzi Mandes, MD   5,000 Units at 10/17/14 1335  . HYDROcodone-acetaminophen (NORCO/VICODIN) 5-325 MG per tablet 1-2 tablet  1-2 tablet Oral Q6H PRN Fritzi Mandes, MD      . lamoTRIgine (LAMICTAL) tablet 100 mg  100 mg Oral QHS Fritzi Mandes, MD      . latanoprost (XALATAN) 0.005 % ophthalmic solution 1 drop  1 drop Both Eyes QHS Fritzi Mandes, MD      . levothyroxine (SYNTHROID, LEVOTHROID) tablet 88 mcg  88 mcg Oral Weber Cooks, MD   88 mcg at 10/17/14 1141  . metroNIDAZOLE (FLAGYL) IVPB 500 mg  500 mg Intravenous Q8H Fritzi Mandes, MD   500 mg at 10/17/14 1224  . morphine 2 MG/ML injection 1 mg  1 mg Intravenous Q3H PRN Fritzi Mandes, MD      . ondansetron (ZOFRAN) injection 4 mg  4 mg Intravenous Q6H PRN [name], MD   4 mg at 10/17/14 1354  . pravastatin (PRAVACHOL) tablet 20 mg  20 mg Oral q1800 Fritzi Mandes, MD      . senna-docusate (Senokot-S) tablet 1 tablet  1 tablet Oral QHS PRN Fritzi Mandes, MD      . vitamin B-12 (CYANOCOBALAMIN) tablet 1,000 mcg  1,000 mcg Oral Daily Fritzi Mandes, MD   1,000 mcg at 10/17/14 1141    Allergies as of 10/17/2014 - Review Complete 10/17/2014  Allergen Reaction Noted  . Aspirin Other (See Comments) 09/25/2014  . Codeine Nausea And Vomiting 09/25/2014  . Codeine sulfate Nausea Only 09/25/2014  . Prednisone Other (See Comments) 09/25/2014  . Valium [diazepam] Other (See Comments) 09/25/2014     Review of Systems:    A 12 system review was obtained and all negative except where noted in HPI.    Physical Exam:  Vital signs in last 24 hours: Temp:  [98.2 F (36.8  C)-98.6 F (37 C)] 98.6 F (37 C) (05/31 0852) Pulse Rate:  [65-129] 65 (05/31 0852) Resp:  [11-20] 18 (05/31 0852) BP: (103-145)/(54-108) 123/57 mmHg (05/31 0852) SpO2:  [96 %-100 %] 98 % (05/31 0852) Weight:  [78.019 kg (172 lb)] 78.019 kg (172 lb) (05/31 0333) Last BM Date:  (Pt states a few days ago.)  General:  Well-developed, well-nourished and in moderate  distress- began vomiting  Head:  Head without obvious abnormality, atraumatic  Eyes:   Conjunctiva pink, sclera anicteric   Neck:   Supple w/o thyromegaly or mass, trachea midline, no adenopathy  Lungs: Clear to auscultation bilaterally, respirations unlabored Heart:     Normal S1S2, no rubs, murmurs, gallops. Abdomen: Soft, diffuse tender all over, non-distended, no pain with cough, no hepatosplenomegaly, hernia, or mass and BS normal Rectal: Deferred Lymph:  No cervical or supraclavicular adenopathy. Extremities:   No edema, cyanosis, or clubbing Skin  Skin color pale,  no rashes or lesions Neuro:  A&O x 3. CNII-XII intact, normal strength Psych:  Flat effect.  Data Reviewed:  LAB RESULTS:  Recent Labs  10/17/14 0340  WBC 15.7*  HGB 15.2  HCT 46.3  PLT 221   BMET  Recent Labs  10/17/14 0340  NA 142  K 4.0  CL 108  CO2 19*  GLUCOSE 167*  BUN 19  CREATININE 1.87*  CALCIUM 9.8   LFT  Recent Labs  10/17/14 0340  PROT 7.4  ALBUMIN 4.3  AST 24  ALT 12*  ALKPHOS 44  BILITOT 0.8   PT/INR No results for input(s): LABPROT, INR in the last 72 hours.  STUDIES: Ct Abdomen Pelvis Wo Contrast  10/17/2014   CLINICAL DATA:  81 year old female with abdominal pain radiating into the back. Nausea and vomiting.  EXAM: CT ABDOMEN AND PELVIS WITHOUT CONTRAST  TECHNIQUE: Multidetector CT imaging of the abdomen and pelvis was performed following the standard protocol without IV contrast.  COMPARISON:  CT 07/24/2012, MRI 12/22/2013  FINDINGS: Mild emphysematous change at the lung bases. No consolidation. The heart  size is normal.  Clips in the gallbladder fossa from cholecystectomy. No evidence of focal hepatic lesion allowing for lack contrast. There is a small amount of perihepatic free fluid. Punctate granuloma in the spleen. Spleen is normal in size. Mild pancreatic atrophy without ductal dilatation or inflammatory change. Kidneys are symmetric in size without hydronephrosis. Scattered renal parenchymal calcifications and cysts, similar in appearance to prior exam. There is no hydronephrosis.  Stomach is physiologically distended. Small hiatal hernia. There multifocal regions of small bowel thickening and adjacent inflammatory change. Moderate segment of small bowel inflammation in the left upper quadrant of the abdomen. Moderate length segment of diffuse small bowel thickening in the right lower quadrant. The terminal ileum appears normal. There is associated mesenteric edema related to the inflammatory process. No obstruction, oral contrast is seen throughout the colon. There scattered distal colonic diverticula without diverticulitis. No colonic wall thickening.  Proximal appendix is filled with oral contrast. Bullous fluid density structure involving the distal appendix, measuring up to 2.3 cm. There is mild soft tissue inflammation in the right lower quadrant, however this appears to be related to a small bowel inflammation rather than appendiceal inflammation. This appears increased in size from prior exam where it measured 19 mm. Mild mesenteric edema and multiple mesenteric lymph nodes.  No retroperitoneal adenopathy. Abdominal aorta is normal in caliber.  Within the pelvis the urinary bladder is physiologically distended. Uterus is slightly bulbous, may reflect underlying fibroids. The ovaries are not discretely identified. Small volume of free fluid in the pelvis.  There are no acute or suspicious osseous abnormalities. The bones appear under mineralized. There is degenerative change in the lower lumbar spine  with primarily facet arthropathy. Degenerative change of both hips.  IMPRESSION: 1. Multifocal small bowel enteritis involving left upper and right lower quadrants. There is associated mesenteric edema and free  fluid. Findings may be infectious or inflammatory. No perforation. 2. Bulbous fluid-filled dilatation of the distal appendix measuring up to 2.3 cm. This is progressed from prior exam. 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 Surgical consultation is recommended. 3. Chronic findings include postcholecystectomy, sequela of prior granulomatous disease in the spleen, and multiple renal cysts and calcifications.   Electronically Signed   By: Jeb Levering M.D.   On: 10/17/2014 07:04     Assessment:  Stephanie Anthony is a 67 y.o. is admitted with acute abdominal pain , nausea, vomiting, dehydration, acute renal failure,  and leukocytosis. CT with small bowel segmental enteritis in LUQ and RLQ.Unusual.  She denies diarrhea. Etiology to consider bacterial. No history of Crohn's. Abnormal appendix noted  with possible mucocele versus malignancy and no apparent acute appendicitis. She has been seen by the surgeon and his notes reviewed.   Plan:  Continue with the Cipro and Flagyl antibiotics. Obtain inflammatory markers for baseline. Reviewed old colonoscopy and EGD records from 2011. Zofran prn for n/v may advance to q 4hr if needed.  Continue observation on ABx therapy to see if she improves.  If no improvement, consider EGD in a few days. Further GI recommendations pending her clinical course.   This case was discussed with Dr. Loistine Simas  in collaboration of care. Thank you for the consultation.   These services provided by Denice Paradise RN, MSN, ANP-BC under collaborative practice agreement with Loistine Simas, MD.  10/17/2014, 2:14 PM

## 2014-10-18 DIAGNOSIS — K529 Noninfective gastroenteritis and colitis, unspecified: Secondary | ICD-10-CM

## 2014-10-18 LAB — C DIFFICILE QUICK SCREEN W PCR REFLEX
C DIFFICILE (CDIFF) INTERP: NEGATIVE
C Diff antigen: NEGATIVE
C Diff toxin: NEGATIVE

## 2014-10-18 NOTE — Progress Notes (Signed)
[facility]   PATIENT NAME: Semaya Vida    MR#:  [id number]  DATE OF BIRTH:  1933-11-25  SUBJECTIVE:   Still with intermittent abdominal pain. No nausea or emesis, wants to know when she can go home. REVIEW OF SYSTEMS:   Review of Systems  Constitutional: Positive for malaise/fatigue. Negative for fever and chills.  Eyes: Negative.   Gastrointestinal: Positive for abdominal pain. Negative for nausea and vomiting.  All other systems reviewed and are negative.   DRUG ALLERGIES:   Allergies  Allergen Reactions  . Aspirin Other (See Comments)    Reaction: Rectal bleeding  . Codeine Nausea And Vomiting  . Codeine Sulfate Nausea Only  . Prednisone Other (See Comments)    Reaction: Hallucinations  . Valium [Diazepam] Other (See Comments)    Reaction: Strange feelings    VITALS:  Blood pressure 115/52, pulse 56, temperature 98.2 F (36.8 C), temperature source Oral, resp. rate 17, height 5\' 7"  (1.702 m), weight 78.019 kg (172 lb), SpO2 99 %.  PHYSICAL EXAMINATION:  GENERAL:  81 y.o.-year-old patient lying in the bed with no acute distress.  EYES: Pupils equal, round, reactive to light and accommodation. No scleral icterus. Extraocular muscles intact.  HEENT: Head atraumatic, normocephalic. Oropharynx and nasopharynx clear.  NECK:  Supple, no jugular venous distention. No thyroid enlargement, no tenderness.  LUNGS: Normal breath sounds bilaterally, no wheezing, rales,rhonchi or crepitation. No use of accessory muscles of respiration.  CARDIOVASCULAR: S1, S2 normal. No murmurs, rubs, or gallops.  ABDOMEN: Soft, nontender, nondistended. Bowel sounds present. No organomegaly or mass. Minimal tenderness, no peritoneal signs present. EXTREMITIES: No pedal edema, cyanosis, or clubbing.  NEUROLOGIC: Cranial nerves II through XII are intact. Muscle strength 5/5 in all extremities. Sensation intact. Gait not checked.  PSYCHIATRIC: The patient is alert and oriented x 3.   SKIN: No obvious rash, lesion, or ulcer.   CBC Latest Ref Rng 10/17/2014 09/25/2014 09/11/2014  WBC 3.6 - 11.0 K/uL 15.7(H) 7.0 7.2  Hemoglobin 12.0 - 16.0 g/dL 15.2 13.1 12.8  Hematocrit 35.0 - 47.0 % 46.3 39.5 38.8  Platelets 150 - 440 K/uL 221 177 184    BMP Latest Ref Rng 10/17/2014 09/25/2014 09/11/2014  Glucose 65 - 99 mg/dL 167(H) 104(H) 97  BUN 6 - 20 mg/dL 19 22(H) 22(H)  Creatinine 0.44 - 1.00 mg/dL 1.87(H) 1.62(H) 1.53(H)  Sodium 135 - 145 mmol/L 142 142 143  Potassium 3.5 - 5.1 mmol/L 4.0 4.2 4.8  Chloride 101 - 111 mmol/L 108 108 111  CO2 22 - 32 mmol/L 19(L) 25 25  Calcium 8.9 - 10.3 mg/dL 9.8 9.3 9.4      ASSESSMENT AND PLAN:    Enteritis cont iv abx, bowel rest  Appendiceal mass:  The patient will need appendectomy with consideration of concurrent colectomy at the time of diagnosis. This will need to occur after the enteritis is resolved.  He seemed to voiced some understanding.

## 2014-10-18 NOTE — Progress Notes (Signed)
[facility] [place] NAME: Stephanie Anthony    MR#:  [id number]  DATE OF BIRTH:  [birth year]  SUBJECTIVE:  Feels better. Tolerating CLD. No fever  REVIEW OF SYSTEMS:    Review of Systems  Constitutional: Negative for fever, chills and weight loss.  HENT: Negative for ear discharge, ear pain and nosebleeds.   Eyes: Negative for blurred vision, pain and discharge.  Respiratory: Negative for sputum production, shortness of breath, wheezing and stridor.   Cardiovascular: Negative for chest pain, palpitations, orthopnea and PND.  Gastrointestinal: Positive for abdominal pain. Negative for nausea, vomiting and diarrhea.  Genitourinary: Negative for urgency and frequency.  Musculoskeletal: Negative for back pain and joint pain.  Neurological: Negative for sensory change, speech change, focal weakness and weakness.  Psychiatric/Behavioral: Negative for depression and hallucinations. The patient is not nervous/anxious.   All other systems reviewed and are negative.  Tolerating Diet:yes   DRUG ALLERGIES:   Allergies  Allergen Reactions  . Aspirin Other (See Comments)    Reaction: Rectal bleeding  . Codeine Nausea And Vomiting  . Codeine Sulfate Nausea Only  . Prednisone Other (See Comments)    Reaction: Hallucinations  . Valium [Diazepam] Other (See Comments)    Reaction: Strange feelings    VITALS:  Blood pressure 115/52, pulse 56, temperature 98.2 F (36.8 C), temperature source Oral, resp. rate 17, height 5\' 7"  (1.702 m), weight 76.431 kg (168 lb 8 oz), SpO2 99 %.  PHYSICAL EXAMINATION:   Physical Exam  GENERAL:  81 y.o.-year-old patient lying in the bed with no acute distress.  EYES: Pupils equal, round, reactive to light and accommodation. No scleral icterus. Extraocular muscles intact.  HEENT: Head atraumatic, normocephalic. Oropharynx and nasopharynx clear.  NECK:  Supple, no jugular venous distention. No thyroid  enlargement, no tenderness.  LUNGS: Normal breath sounds bilaterally, no wheezing, rales, rhonchi. No use of accessory muscles of respiration.  CARDIOVASCULAR: S1, S2 normal. No murmurs, rubs, or gallops.  ABDOMEN: Soft, nontender, nondistended. Bowel sounds present. No organomegaly or mass.  EXTREMITIES: No cyanosis, clubbing or edema b/l.    NEUROLOGIC: Cranial nerves II through XII are intact. No focal Motor or sensory deficits b/l.   PSYCHIATRIC: The patient is alert and oriented x 3.  SKIN: No obvious rash, lesion, or ulcer.    LABORATORY PANEL:   CBC  Recent Labs Lab 10/17/14 0340  WBC 15.7*  HGB 15.2  HCT 46.3  PLT 221   ------------------------------------------------------------------------------------------------------------------  Chemistries   Recent Labs Lab 10/17/14 0340  NA 142  K 4.0  CL 108  CO2 19*  GLUCOSE 167*  BUN 19  CREATININE 1.87*  CALCIUM 9.8  AST 24  ALT 12*  ALKPHOS 44  BILITOT 0.8   ------------------------------------------------------------------------------------------------------------------  Cardiac Enzymes  Recent Labs Lab 10/17/14 0340  TROPONINI <0.03   ------------------------------------------------------------------------------------------------------------------  RADIOLOGY:  Ct Abdomen Pelvis Wo Contrast  10/17/2014   CLINICAL DATA:  81 year old female with abdominal pain radiating into the back. Nausea and vomiting.  EXAM: CT ABDOMEN AND PELVIS WITHOUT CONTRAST  TECHNIQUE: Multidetector CT imaging of the abdomen and pelvis was performed following the standard protocol without IV contrast.  COMPARISON:  CT 07/24/2012, MRI 12/22/2013  FINDINGS: Mild emphysematous change at the lung bases. No consolidation. The heart size is normal.  Clips in the gallbladder fossa from cholecystectomy. No evidence of focal hepatic lesion allowing for lack contrast. There is a small amount of perihepatic free fluid. Punctate granuloma in the  spleen. Spleen is normal in size. Mild pancreatic atrophy without ductal dilatation or inflammatory change. Kidneys are symmetric in size without hydronephrosis. Scattered renal parenchymal calcifications and cysts, similar in appearance to prior exam. There is no hydronephrosis.  Stomach is physiologically distended. Small hiatal hernia. There multifocal regions of small bowel thickening and adjacent inflammatory change. Moderate segment of small bowel inflammation in the left upper quadrant of the abdomen. Moderate length segment of diffuse small bowel thickening in the right lower quadrant. The terminal ileum appears normal. There is associated mesenteric edema related to the inflammatory process. No obstruction, oral contrast is seen throughout the colon. There scattered distal colonic diverticula without diverticulitis. No colonic wall thickening.  Proximal appendix is filled with oral contrast. Bullous fluid density structure involving the distal appendix, measuring up to 2.3 cm. There is mild soft tissue inflammation in the right lower quadrant, however this appears to be related to a small bowel inflammation rather than appendiceal inflammation. This appears increased in size from prior exam where it measured 19 mm. Mild mesenteric edema and multiple mesenteric lymph nodes.  No retroperitoneal adenopathy. Abdominal aorta is normal in caliber.  Within the pelvis the urinary bladder is physiologically distended. Uterus is slightly bulbous, may reflect underlying fibroids. The ovaries are not discretely identified. Small volume of free fluid in the pelvis.  There are no acute or suspicious osseous abnormalities. The bones appear under mineralized. There is degenerative change in the lower lumbar spine with primarily facet arthropathy. Degenerative change of both hips.  IMPRESSION: 1. Multifocal small bowel enteritis involving left upper and right lower quadrants. There is associated mesenteric edema and free  fluid. Findings may be infectious or inflammatory. No perforation. 2. Bulbous fluid-filled dilatation of the distal appendix measuring up to 2.3 cm. This is progressed from prior exam. 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 Surgical consultation is recommended. 3. Chronic findings include postcholecystectomy, sequela of prior granulomatous disease in the spleen, and multiple renal cysts and calcifications.   Electronically Signed   By: Jeb Levering M.D.   On: 10/17/2014 07:04     ASSESSMENT AND PLAN:   81 year old [name] with history of bipolar disorder, coronary artery disease, hypertension comes to the emergency room with increasing abdominal pain and intractable nausea vomiting she's being admitted with  #1 acute small bowel enteritis. -Tolerating clear liquid diet. IV fluids for hydration. IV Cipro & Flagyl Follow-up WBC count -appreciate GI Dr. Gustavo Lah  input  #2 abnormal CT of the abdomen showing bulbous fluid-filled dilatation of distal appendix which has progressed from prior exam.  -Surgical recs noted. Pt if decides will need appendectomy  #3 hypertension Patient currently has relative hypotension I will hold off on enalapril and and Lasix.  #4 history of bipolar disorder with psychotic features Patient appears calm and cooperative cntinue her Lamictal   #5 intractable nausea vomiting with dehydration due to acute renal failure Clear liquid diet IV fluids for hydration Monitor I's and O and creatinine  #6 DVT prophylaxis subcutaneous heparin    Case discussed with Care Management/Social Worker. Management plans discussed with the patient and in agreement.  CODE STATUS: FULL  DVT Prophylaxis: heparin  TOTAL TIME TAKING CARE OF THIS PATIENT: 25 minutes.   D/C 2-3 DAYS, DEPENDING ON CLINICAL CONDITION.   Cynthis Purington M.D on 10/18/2014 at 4:40 PM  Between 7am to 6pm - Pager - [phone]  After  6pm go to [email or web address] - [place]  [facility]  Office  [phone]  CC: Primary care physician; [name], MD

## 2014-10-18 NOTE — Progress Notes (Signed)
Talked with Dr Jannifer Franklin concerning patient's BP 121/42 and recheck 105/47. Pt is asymptomatic. As pt is receiving fluids at this time Dr Jannifer Franklin has nurse continuing to monitor BP.

## 2014-10-18 NOTE — Consult Note (Signed)
Subjective: Patient seen for abdominal pain. Patient states that her pain is maybe a little worse today than it was yesterday. It is basically more so periumbilical or so in the right lower quadrant. The pain seemed to increase after eating the clear liquids today. She has no nausea or vomiting. She did have a bowel movement earlier that was initially performed then change to watery. Apparent blood in the stool.  Objective: Vital signs in last 24 hours: Temp:  [98.1 F (36.7 C)-98.4 F (36.9 C)] 98.2 F (36.8 C) (06/01 0656) Pulse Rate:  [56-64] 56 (06/01 0656) Resp:  [17] 17 (06/01 0656) BP: (99-121)/(40-52) 115/52 mmHg (06/01 0656) SpO2:  [96 %-99 %] 99 % (06/01 0656) Weight:  [76.431 kg (168 lb 8 oz)] 76.431 kg (168 lb 8 oz) (06/01 1359) Blood pressure 115/52, pulse 56, temperature 98.2 F (36.8 C), temperature source Oral, resp. rate 17, height 5\' 7"  (1.702 m), weight 76.431 kg (168 lb 8 oz), SpO2 99 %.   Intake/Output from previous day: 05/31 0701 - 06/01 0700 In: 2969 [P.O.:60; I.V.:2509; IV Piggyback:400] Out: 550 [Urine:350; Emesis/NG output:200]  Intake/Output this shift: Total I/O In: 2313.5 [P.O.:600; I.V.:1713.5] Out: 0    General appearance:  Elderly-appearing 81 year old Caucasian female no acute distress Resp:  Clear to auscultation Cardio:  Regular rate and rhythm without rub or gallop GI:  Soft nondistended bowel sounds are positive and normoactive. There is tenderness in the agents slightly above the umbilicus as well as right lower quadrant. There are no masses or rebound. Extremities:  No clubbing cyanosis or edema   Lab Results: No results found for this or any previous visit (from the past 24 hour(s)).    Recent Labs  10/17/14 0340  WBC 15.7*  HGB 15.2  HCT 46.3  PLT 221   BMET  Recent Labs  10/17/14 0340  NA 142  K 4.0  CL 108  CO2 19*  GLUCOSE 167*  BUN 19  CREATININE 1.87*  CALCIUM 9.8   LFT  Recent Labs  10/17/14 0340  PROT  7.4  ALBUMIN 4.3  AST 24  ALT 12*  ALKPHOS 44  BILITOT 0.8   PT/INR No results for input(s): LABPROT, INR in the last 72 hours. Hepatitis Panel No results for input(s): HEPBSAG, HCVAB, HEPAIGM, HEPBIGM in the last 72 hours. C-Diff No results for input(s): CDIFFTOX in the last 72 hours. No results for input(s): CDIFFPCR in the last 72 hours.   Studies/Results: Ct Abdomen Pelvis Wo Contrast  10/17/2014   CLINICAL DATA:  81 year old female with abdominal pain radiating into the back. Nausea and vomiting.  EXAM: CT ABDOMEN AND PELVIS WITHOUT CONTRAST  TECHNIQUE: Multidetector CT imaging of the abdomen and pelvis was performed following the standard protocol without IV contrast.  COMPARISON:  CT 07/24/2012, MRI 12/22/2013  FINDINGS: Mild emphysematous change at the lung bases. No consolidation. The heart size is normal.  Clips in the gallbladder fossa from cholecystectomy. No evidence of focal hepatic lesion allowing for lack contrast. There is a small amount of perihepatic free fluid. Punctate granuloma in the spleen. Spleen is normal in size. Mild pancreatic atrophy without ductal dilatation or inflammatory change. Kidneys are symmetric in size without hydronephrosis. Scattered renal parenchymal calcifications and cysts, similar in appearance to prior exam. There is no hydronephrosis.  Stomach is physiologically distended. Small hiatal hernia. There multifocal regions of small bowel thickening and adjacent inflammatory change. Moderate segment of small bowel inflammation in the left upper quadrant of the abdomen. Moderate length  segment of diffuse small bowel thickening in the right lower quadrant. The terminal ileum appears normal. There is associated mesenteric edema related to the inflammatory process. No obstruction, oral contrast is seen throughout the colon. There scattered distal colonic diverticula without diverticulitis. No colonic wall thickening.  Proximal appendix is filled with oral  contrast. Bullous fluid density structure involving the distal appendix, measuring up to 2.3 cm. There is mild soft tissue inflammation in the right lower quadrant, however this appears to be related to a small bowel inflammation rather than appendiceal inflammation. This appears increased in size from prior exam where it measured 19 mm. Mild mesenteric edema and multiple mesenteric lymph nodes.  No retroperitoneal adenopathy. Abdominal aorta is normal in caliber.  Within the pelvis the urinary bladder is physiologically distended. Uterus is slightly bulbous, may reflect underlying fibroids. The ovaries are not discretely identified. Small volume of free fluid in the pelvis.  There are no acute or suspicious osseous abnormalities. The bones appear under mineralized. There is degenerative change in the lower lumbar spine with primarily facet arthropathy. Degenerative change of both hips.  IMPRESSION: 1. Multifocal small bowel enteritis involving left upper and right lower quadrants. There is associated mesenteric edema and free fluid. Findings may be infectious or inflammatory. No perforation. 2. Bulbous fluid-filled dilatation of the distal appendix measuring up to 2.3 cm. This is progressed from prior exam. 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 Surgical consultation is recommended. 3. Chronic findings include postcholecystectomy, sequela of prior granulomatous disease in the spleen, and multiple renal cysts and calcifications.   Electronically Signed   By: Jeb Levering M.D.   On: 10/17/2014 07:04    Scheduled Inpatient Medications:   . ALPRAZolam  0.5 mg Oral QHS  . cholecalciferol  1,000 Units Oral Daily  . ciprofloxacin  400 mg Intravenous Q24H  . heparin  5,000 Units Subcutaneous 3 times per day  . lamoTRIgine  100 mg Oral QHS  . latanoprost  1 drop Both Eyes QHS  . levothyroxine  88 mcg Oral BH-q7a  . metronidazole  500 mg  Intravenous Q8H  . pravastatin  20 mg Oral q1800  . vitamin B-12  1,000 mcg Oral Daily    Continuous Inpatient Infusions:   . sodium chloride 125 mL/hr at 10/18/14 1217    PRN Inpatient Medications:  acetaminophen **OR** acetaminophen, HYDROcodone-acetaminophen, morphine injection, ondansetron (ZOFRAN) IV, senna-docusate  Miscellaneous:   Assessment:  1. Abdominal pain, associated with enteritis. Currently on anti-biotics and afebrile. 2. Abnormal CT scan with illness of the appendix  Plan:  1. Continue current anti-biotics. 2. Surgery recommendations noted. 3. Would hold a clear liquid diet for now.  [name] MD 10/18/2014, 5:26 PM

## 2014-10-18 NOTE — Progress Notes (Signed)
Initial Nutrition Assessment  DOCUMENTATION CODES:     INTERVENTION:  Medical Food Supplement Therapy:  Will recommend Boost Breeze on follow up if diet unable to be advanced Coordination of Care: RN Anabella agreeable to collecting accurate weight    NUTRITION DIAGNOSIS:  Inadequate oral intake related to inability to eat as evidenced by NPO status/CL status  GOAL:   (Goal for diet advancement as medically able within 5-7 days)  MONITOR:   (Energy intake, Digestive system, Anthropometrics)  REASON FOR ASSESSMENT:  Malnutrition Screening Tool    ASSESSMENT:  Pt admitted with appeniceal mass and gastritis. Per surgical MD note likely in need of appendectomy and possible colectomy after enteritis is resolved. PMHx: Past Medical History  Diagnosis Date  . Bipolar 1 disorder   . Seasonal allergies   . Murmur, cardiac   . Coronary artery disease   . Chronic mitral valve disease   . Hypertension    PO Intake: pt reports poor appetite for the past 2 weeks progressively worsening. Pt reports eating smaller and smaller meals, relatively snacking PTA. Pt ate broth and popsicle on visit at lunch. Pt reports not drinking supplements PTA.  Medications: NS at 157mL/hr, Senokot, Cipro, Flagyl, Zofran Labs: Protein Profile:  Recent Labs Lab 10/17/14 0340  ALBUMIN 4.3   Electrolyte and Renal Profile:  Recent Labs Lab 10/17/14 0340  BUN 19  CREATININE 1.87*  NA 142  K 4.0   Nutritional Anemia Profile:  CBC Latest Ref Rng 10/17/2014 09/25/2014 09/11/2014  WBC 3.6 - 11.0 K/uL 15.7(H) 7.0 7.2  Hemoglobin 12.0 - 16.0 g/dL 15.2 13.1 12.8  Hematocrit 35.0 - 47.0 % 46.3 39.5 38.8  Platelets 150 - 440 K/uL 221 177 184   Pt reports UBW of 172lbs PTA. Pt reports likely weight has been stable but unsure. Weight on admission likely stated as well.  Height:  Ht Readings from Last 1 Encounters:  10/17/14 5\' 7"  (1.702 m)    Weight:  Wt Readings from Last 1 Encounters:   10/17/14 172 lb (78.019 kg)    Ideal Body Weight:     Wt Readings from Last 10 Encounters:  10/17/14 172 lb (78.019 kg)    BMI:  Body mass index is 26.93 kg/(m^2).   Nutrition-Focused physical exam completed. Findings are WDL for fat depletion, muscle depletion, and edema.    Estimated Nutritional Needs:  Kcal:  1706-2017kcals, BEE: 1293kcals, TEE: (IF 1.1-1.3)(AF 1.2)  Protein:  78-94g protein, (1.0-1.2g/kg)  Fluid:  1950-2367mL of fluid (25-21mL/kg)  Skin:  Reviewed, no issues  Diet Order:  Diet clear liquid Room service appropriate?: Yes; Fluid consistency:: Thin  EDUCATION NEEDS:  Education needs no appropriate at this time   Intake/Output Summary (Last 24 hours) at 10/18/14 1306 Last data filed at 10/18/14 0900  Gross per 24 hour  Intake   3209 ml  Output    200 ml  Net   3009 ml    Last BM:  6/1  MODERATE Care Level  Dwyane Luo, RD, LDN Pager [phone]

## 2014-10-18 NOTE — Plan of Care (Signed)
Problem: Consults Goal: General Surgical Patient Education (See Patient Education module for education specifics)  Outcome: Progressing Pt noted with consults to general surgery GI,  And hospitalist.

## 2014-10-19 ENCOUNTER — Encounter: Payer: Self-pay | Admitting: Surgery

## 2014-10-19 DIAGNOSIS — K529 Noninfective gastroenteritis and colitis, unspecified: Secondary | ICD-10-CM

## 2014-10-19 LAB — CBC WITH DIFFERENTIAL/PLATELET
Basophils Absolute: 0 10*3/uL (ref 0–0.1)
Basophils Relative: 1 %
Eosinophils Absolute: 0.2 10*3/uL (ref 0–0.7)
Eosinophils Relative: 4 %
HEMATOCRIT: 32.3 % — AB (ref 35.0–47.0)
Hemoglobin: 10.6 g/dL — ABNORMAL LOW (ref 12.0–16.0)
Lymphocytes Relative: 23 %
Lymphs Abs: 1.1 10*3/uL (ref 1.0–3.6)
MCH: 26.2 pg (ref 26.0–34.0)
MCHC: 32.9 g/dL (ref 32.0–36.0)
MCV: 79.7 fL — ABNORMAL LOW (ref 80.0–100.0)
MONO ABS: 0.6 10*3/uL (ref 0.2–0.9)
Monocytes Relative: 13 %
Neutro Abs: 2.9 10*3/uL (ref 1.4–6.5)
Neutrophils Relative %: 59 %
Platelets: 114 10*3/uL — ABNORMAL LOW (ref 150–440)
RBC: 4.06 MIL/uL (ref 3.80–5.20)
RDW: 14.1 % (ref 11.5–14.5)
WBC: 4.9 10*3/uL (ref 3.6–11.0)

## 2014-10-19 MED ORDER — PROMETHAZINE HCL 25 MG/ML IJ SOLN
12.5000 mg | INTRAMUSCULAR | Status: DC | PRN
  Administered 2014-10-19: 12.5 mg via INTRAVENOUS
  Filled 2014-10-19: qty 1

## 2014-10-19 MED ORDER — SODIUM CHLORIDE 0.9 % IV SOLN
3.0000 g | Freq: Three times a day (TID) | INTRAVENOUS | Status: DC
  Administered 2014-10-19 – 2014-10-20 (×3): 3 g via INTRAVENOUS
  Filled 2014-10-19 (×7): qty 3

## 2014-10-19 NOTE — Progress Notes (Signed)
[facility] [place] NAME: Caleyah Jr    MR#:  [id number]  DATE OF BIRTH:  February 19, 1934  SUBJECTIVE:  Feels better. Tolerating CLD. No fever  REVIEW OF SYSTEMS:    Review of Systems  Constitutional: Negative for fever, chills, weight loss and diaphoresis.  HENT: Negative for congestion, ear discharge, ear pain, hearing loss, nosebleeds and sore throat.   Eyes: Negative for blurred vision, double vision, photophobia, pain and discharge.  Respiratory: Negative for hemoptysis, sputum production, shortness of breath, wheezing and stridor.   Cardiovascular: Negative for chest pain, palpitations, orthopnea, claudication, leg swelling and PND.  Gastrointestinal: Positive for nausea, vomiting and abdominal pain. Negative for heartburn and diarrhea.  Genitourinary: Negative for dysuria, urgency and frequency.  Musculoskeletal: Negative for back pain, joint pain and neck pain.  Skin: Negative for rash.  Neurological: Negative for speech change, focal weakness, seizures, weakness and headaches.  Endo/Heme/Allergies: Does not bruise/bleed easily.  Psychiatric/Behavioral: Negative for memory loss. The patient is not nervous/anxious.   All other systems reviewed and are negative.  Tolerating Diet:yes   DRUG ALLERGIES:   Allergies  Allergen Reactions  . Aspirin Other (See Comments)    Reaction: Rectal bleeding  . Codeine Nausea And Vomiting  . Codeine Sulfate Nausea Only  . Prednisone Other (See Comments)    Reaction: Hallucinations  . Valium [Diazepam] Other (See Comments)    Reaction: Strange feelings    VITALS:  Blood pressure 120/48, pulse 59, temperature 97.9 F (36.6 C), temperature source Oral, resp. rate 18, height 5\' 7"  (1.702 m), weight 76.431 kg (168 lb 8 oz), SpO2 98 %.  PHYSICAL EXAMINATION:   Physical Exam  GENERAL:  81 y.o.-year-old patient lying in the bed with no acute distress.  EYES: Pupils equal, round, reactive  to light and accommodation. No scleral icterus. Extraocular muscles intact.  HEENT: Head atraumatic, normocephalic. Oropharynx and nasopharynx clear.  NECK:  Supple, no jugular venous distention. No thyroid enlargement, no tenderness.  LUNGS: Normal breath sounds bilaterally, no wheezing, rales, rhonchi. No use of accessory muscles of respiration.  CARDIOVASCULAR: S1, S2 normal. No murmurs, rubs, or gallops.  ABDOMEN: Soft, some tenderness i upper abdomen, nondistended. Bowel sounds present. No organomegaly or mass.  EXTREMITIES: No cyanosis, clubbing or edema b/l.    NEUROLOGIC: Cranial nerves II through XII are intact. No focal Motor or sensory deficits b/l.   PSYCHIATRIC: The patient is alert and oriented x 3.  SKIN: No obvious rash, lesion, or ulcer.    LABORATORY PANEL:   CBC  Recent Labs Lab 10/19/14 0516  WBC 4.9  HGB 10.6*  HCT 32.3*  PLT 114*   Chemistries   Recent Labs Lab 10/17/14 0340  NA 142  K 4.0  CL 108  CO2 19*  GLUCOSE 167*  BUN 19  CREATININE 1.87*  CALCIUM 9.8  AST 24  ALT 12*  ALKPHOS 44  BILITOT 0.8    Cardiac Enzymes  Recent Labs Lab 10/17/14 0340  TROPONINI <0.03   ASSESSMENT AND PLAN:   81 year old [name] with history of bipolar disorder, coronary artery disease, hypertension comes to the emergency room with increasing abdominal pain and intractable nausea vomiting she's being admitted with  #1 acute small bowel enteritis. -Tolerating intermittently clear liquid diet. IV fluids for hydration. IV Cipro & Flagyl--->change to IV unasyn due to nasuea -appreciate GI Dr. Gustavo Lah  Input. plans for EGD in am due to abnormal appearance of stomach on CT abd  #  2 abnormal CT of the abdomen showing bulbous fluid-filled dilatation of distal appendix which has progressed from prior exam.  -Surgical recs noted. Pt if decides will need appendectomy  #3 hypertension Patient currently has relative hypotension I will hold off on enalapril and  and Lasix.  #4 history of bipolar disorder with psychotic features Patient appears calm and cooperative cntinue her Lamictal   #5 intractable nausea vomiting with dehydration due to acute renal failure Clear liquid diet IV fluids for hydration Monitor I's and O and creatinine  #6 DVT prophylaxis subcutaneous heparin  Case discussed with Care Management/Social Worker. Management plans discussed with the patient and in agreement.  CODE STATUS: FULL  DVT Prophylaxis: heparin  TOTAL TIME TAKING CARE OF THIS PATIENT: 25 minutes.   D/C 2-3 DAYS, DEPENDING ON CLINICAL CONDITION.   Cerise Lieber M.D on 10/19/2014 at 1:52 PM  Between 7am to 6pm - Pager - [phone]  After 6pm go to [email or web address] - password [email or web address] [facility]  Office  [phone]  CC: Primary care physician; [name], MD

## 2014-10-19 NOTE — Consult Note (Signed)
Subjective: Patient seen for abdominal pain and abnormal CT scan. Patient has been nauseated this morning and had an episode of emesis. Her pain continues but much lower abdomen as it is upper abdomen mostly toward medial right upper quadrant. Has had some loose stools.  Objective: Vital signs in last 24 hours: Temp:  [97.9 F (36.6 C)-98.7 F (37.1 C)] 97.9 F (36.6 C) (06/02 0753) Pulse Rate:  [57-59] 59 (06/02 0753) Resp:  [18] 18 (06/02 0753) BP: (98-120)/(42-68) 120/48 mmHg (06/02 0753) SpO2:  [92 %-98 %] 98 % (06/02 0753) Weight:  [76.431 kg (168 lb 8 oz)] 76.431 kg (168 lb 8 oz) (06/01 1359) Blood pressure 120/48, pulse 59, temperature 97.9 F (36.6 C), temperature source Oral, resp. rate 18, height 5\' 7"  (1.702 m), weight 76.431 kg (168 lb 8 oz), SpO2 98 %.   Intake/Output from previous day: 06/01 0701 - 06/02 0700 In: 2713.5 [P.O.:600; I.V.:1713.5; IV Piggyback:400] Out: 875 [Urine:475; Stool:400]  Intake/Output this shift: Total I/O In: 1165 [I.V.:1165] Out: 300 [Urine:300]   General appearance:  Elderly-appearing 81 year old female no acute distress Resp:  Bilaterally clear to auscultation Cardio:  Regular rate and rhythm GI:  Soft distended sounds are positive and normoactive tenderness to palpation cross the upper abdomen what noted also in the right lower quadrant. No masses or rebound. Extremities:  Clubbing or cyanosis   Lab Results: Results for orders placed or performed during the hospital encounter of 10/17/14 (from the past 24 hour(s))  C difficile quick scan w PCR reflex [facility])     Status: None   Collection Time: 10/18/14  9:06 PM  Result Value Ref Range   C Diff antigen NEGATIVE    C Diff toxin NEGATIVE    C Diff interpretation Negative for C. difficile   CBC with Differential/Platelet     Status: Abnormal   Collection Time: 10/19/14  5:16 AM  Result Value Ref Range   WBC 4.9 3.6 - 11.0 K/uL   RBC 4.06 3.80 - 5.20 MIL/uL   Hemoglobin 10.6 (L) 12.0  - 16.0 g/dL   HCT 32.3 (L) 35.0 - 47.0 %   MCV 79.7 (L) 80.0 - 100.0 fL   MCH 26.2 26.0 - 34.0 pg   MCHC 32.9 32.0 - 36.0 g/dL   RDW 14.1 11.5 - 14.5 %   Platelets 114 (L) 150 - 440 K/uL   Neutrophils Relative % 59 %   Neutro Abs 2.9 1.4 - 6.5 K/uL   Lymphocytes Relative 23 %   Lymphs Abs 1.1 1.0 - 3.6 K/uL   Monocytes Relative 13 %   Monocytes Absolute 0.6 0.2 - 0.9 K/uL   Eosinophils Relative 4 %   Eosinophils Absolute 0.2 0 - 0.7 K/uL   Basophils Relative 1 %   Basophils Absolute 0.0 0 - 0.1 K/uL      Recent Labs  10/17/14 0340 10/19/14 0516  WBC 15.7* 4.9  HGB 15.2 10.6*  HCT 46.3 32.3*  PLT 221 114*   BMET  Recent Labs  10/17/14 0340  NA 142  K 4.0  CL 108  CO2 19*  GLUCOSE 167*  BUN 19  CREATININE 1.87*  CALCIUM 9.8   LFT  Recent Labs  10/17/14 0340  PROT 7.4  ALBUMIN 4.3  AST 24  ALT 12*  ALKPHOS 44  BILITOT 0.8   PT/INR No results for input(s): LABPROT, INR in the last 72 hours. Hepatitis Panel No results for input(s): HEPBSAG, HCVAB, HEPAIGM, HEPBIGM in the last 72 hours. C-Diff  Recent Labs  10/18/14 2106  CDIFFTOX NEGATIVE   No results for input(s): CDIFFPCR in the last 72 hours.   Studies/Results: No results found.  Scheduled Inpatient Medications:   . ALPRAZolam  0.5 mg Oral QHS  . cholecalciferol  1,000 Units Oral Daily  . ciprofloxacin  400 mg Intravenous Q24H  . lamoTRIgine  100 mg Oral QHS  . latanoprost  1 drop Both Eyes QHS  . levothyroxine  88 mcg Oral BH-q7a  . metronidazole  500 mg Intravenous Q8H  . pravastatin  20 mg Oral q1800  . vitamin B-12  1,000 mcg Oral Daily    Continuous Inpatient Infusions:   . sodium chloride 75 mL/hr at 10/19/14 0945    PRN Inpatient Medications:  acetaminophen **OR** acetaminophen, HYDROcodone-acetaminophen, morphine injection, ondansetron (ZOFRAN) IV, senna-docusate  Miscellaneous:   Assessment:  1. Abdominal pain, abnormal CT scan with typical appendix apparent  segmental enteritis. Atypical finding on CT scan of the stomach itself in the antrum. Possibly traction of the muscles versus intrinsic lesion. Her recurrent nausea and also be related with the metronidazole  Plan:  1. EGD tomorrow. I have discussed the risks benefits and complications of procedures to include not limited to bleeding, infection, perforation and the risk of sedation and the patient wishes to proceed. 2. Will also do a small bowel follow-through after the EGD.  [name] MD 10/19/2014, 1:43 PM

## 2014-10-19 NOTE — Progress Notes (Signed)
Subjective: She is continuing to have significant nausea and vomiting. Her abdominal pain is improved but she is also having significant diarrhea.  Vital signs in last 24 hours: Temp:  [97.9 F (36.6 C)-98.7 F (37.1 C)] 98.2 F (36.8 C) (06/02 1544) Pulse Rate:  [57-59] 59 (06/02 1544) Resp:  [17-18] 17 (06/02 1544) BP: (98-120)/(42-68) 114/48 mmHg (06/02 1544) SpO2:  [92 %-98 %] 98 % (06/02 1544) Last BM Date: 10/19/14  Intake/Output from previous day: 06/01 0701 - 06/02 0700 In: 2713.5 [P.O.:600; I.V.:1713.5; IV Piggyback:400] Out: 875 [Urine:475; Stool:400]  Exam: Her abdomen is soft with minimal abdominal tenderness and active bowel sounds.  Lab Results:  CBC  Recent Labs  10/17/14 0340 10/19/14 0516  WBC 15.7* 4.9  HGB 15.2 10.6*  HCT 46.3 32.3*  PLT 221 114*   CMP     Component Value Date/Time   NA 142 10/17/2014 0340   NA 143 09/11/2014 1229   K 4.0 10/17/2014 0340   K 4.8 09/11/2014 1229   CL 108 10/17/2014 0340   CL 111 09/11/2014 1229   CO2 19* 10/17/2014 0340   CO2 25 09/11/2014 1229   GLUCOSE 167* 10/17/2014 0340   GLUCOSE 97 09/11/2014 1229   BUN 19 10/17/2014 0340   BUN 22* 09/11/2014 1229   CREATININE 1.87* 10/17/2014 0340   CREATININE 1.53* 09/11/2014 1229   CALCIUM 9.8 10/17/2014 0340   CALCIUM 9.4 09/11/2014 1229   PROT 7.4 10/17/2014 0340   PROT 7.2 09/11/2014 1229   ALBUMIN 4.3 10/17/2014 0340   ALBUMIN 4.1 09/11/2014 1229   AST 24 10/17/2014 0340   AST 21 09/11/2014 1229   ALT 12* 10/17/2014 0340   ALT 13* 09/11/2014 1229   ALKPHOS 44 10/17/2014 0340   ALKPHOS 47 09/11/2014 1229   BILITOT 0.8 10/17/2014 0340   GFRNONAA 25* 10/17/2014 0340   GFRNONAA 32* 09/11/2014 1229   GFRAA 29* 10/17/2014 0340   GFRAA 37* 09/11/2014 1229   PT/INR No results for input(s): LABPROT, INR in the last 72 hours.  Studies/Results: No results found.  Assessment/Plan: And EGD is planned for tomorrow. Her antibiotics have been switched in an  effort to control some of her nausea. I do not think the distention and abnormality in her appendix is responsible for current abdominal symptoms. We will hopefully get her through this particular episode and then consider possible elective surgical intervention on her appendix.

## 2014-10-19 NOTE — Progress Notes (Signed)
Pt C- Diff lab came back negative, Will remove precautions.

## 2014-10-20 ENCOUNTER — Encounter: Payer: Self-pay | Admitting: *Deleted

## 2014-10-20 ENCOUNTER — Encounter
Admission: EM | Disposition: A | Discharge status: Home / Self Care | Payer: Self-pay | Source: Home / Self Care | Attending: Internal Medicine

## 2014-10-20 ENCOUNTER — Inpatient Hospital Stay: Payer: Medicare Other | Admitting: Anesthesiology

## 2014-10-20 HISTORY — PX: ESOPHAGOGASTRODUODENOSCOPY (EGD) WITH PROPOFOL: SHX5813

## 2014-10-20 LAB — CBC WITH DIFFERENTIAL/PLATELET
Basophils Absolute: 0 10*3/uL (ref 0–0.1)
Basophils Relative: 1 %
EOS ABS: 0.1 10*3/uL (ref 0–0.7)
EOS PCT: 2 %
HCT: 32.4 % — ABNORMAL LOW (ref 35.0–47.0)
Hemoglobin: 10.8 g/dL — ABNORMAL LOW (ref 12.0–16.0)
LYMPHS ABS: 1.1 10*3/uL (ref 1.0–3.6)
Lymphocytes Relative: 20 %
MCH: 26.7 pg (ref 26.0–34.0)
MCHC: 33.5 g/dL (ref 32.0–36.0)
MCV: 79.7 fL — ABNORMAL LOW (ref 80.0–100.0)
MONOS PCT: 12 %
Monocytes Absolute: 0.7 10*3/uL (ref 0.2–0.9)
NEUTROS PCT: 65 %
Neutro Abs: 3.6 10*3/uL (ref 1.4–6.5)
Platelets: 121 10*3/uL — ABNORMAL LOW (ref 150–440)
RBC: 4.06 MIL/uL (ref 3.80–5.20)
RDW: 14 % (ref 11.5–14.5)
WBC: 5.6 10*3/uL (ref 3.6–11.0)

## 2014-10-20 LAB — PROTIME-INR
INR: 1.1
Prothrombin Time: 14.4 seconds (ref 11.4–15.0)

## 2014-10-20 SURGERY — ESOPHAGOGASTRODUODENOSCOPY (EGD) WITH PROPOFOL
Anesthesia: General

## 2014-10-20 MED ORDER — FENTANYL CITRATE (PF) 100 MCG/2ML IJ SOLN
INTRAMUSCULAR | Status: DC | PRN
  Administered 2014-10-20: 50 ug via INTRAVENOUS

## 2014-10-20 MED ORDER — PROPOFOL 10 MG/ML IV BOLUS
INTRAVENOUS | Status: DC | PRN
  Administered 2014-10-20: 40 mg via INTRAVENOUS
  Administered 2014-10-20: 25 mg via INTRAVENOUS

## 2014-10-20 MED ORDER — LIDOCAINE HCL (CARDIAC) 20 MG/ML IV SOLN
INTRAVENOUS | Status: DC | PRN
  Administered 2014-10-20: 100 mg via INTRAVENOUS

## 2014-10-20 MED ORDER — PANTOPRAZOLE SODIUM 40 MG IV SOLR
40.0000 mg | Freq: Two times a day (BID) | INTRAVENOUS | Status: AC
  Administered 2014-10-20 – 2014-10-21 (×4): 40 mg via INTRAVENOUS
  Filled 2014-10-20 (×4): qty 40

## 2014-10-20 MED ORDER — SODIUM CHLORIDE 0.9 % IV SOLN
INTRAVENOUS | Status: DC | PRN
  Administered 2014-10-20: 08:00:00 via INTRAVENOUS

## 2014-10-20 MED ORDER — PROPOFOL INFUSION 10 MG/ML OPTIME
INTRAVENOUS | Status: DC | PRN
Start: 2014-10-20 — End: 2014-10-20
  Administered 2014-10-20: 120 ug/kg/min via INTRAVENOUS

## 2014-10-20 MED ORDER — SODIUM CHLORIDE 0.9 % IV SOLN
3.0000 g | Freq: Two times a day (BID) | INTRAVENOUS | Status: DC
  Administered 2014-10-20 – 2014-10-22 (×4): 3 g via INTRAVENOUS
  Filled 2014-10-20 (×6): qty 3

## 2014-10-20 NOTE — Op Note (Signed)
[facility] Patient Name: Stephanie Anthony Procedure Date: 10/20/2014 7:10 AM MRN: [id number] Account #: [id number] Date of Birth: 1934/02/22 Admit Type: Inpatient Age: 81 Room: [facility] ENDO ROOM 3 Gender: Female Note Status: Finalized Procedure:         Upper GI endoscopy Indications:       Epigastric abdominal pain, Abdominal pain in the right                     upper quadrant, Abdominal pain in the left upper quadrant Providers:         [name], MD Referring MD:      [name] (Referring MD) Medicines:         Monitored Anesthesia Care Complications:     No immediate complications. Procedure:         Pre-Anesthesia Assessment:                    - ASA Grade Assessment: III - A patient with severe                     systemic disease.                    After obtaining informed consent, the endoscope was passed                     under direct vision. Throughout the procedure, the                     patient's blood pressure, pulse, and oxygen saturations                     were monitored continuously. The Endoscope was introduced                     through the mouth, and advanced to the third part of                     duodenum. The upper GI endoscopy was accomplished without                     difficulty. The patient tolerated the procedure well. Findings:      LA Grade D (one or more mucosal breaks involving at least 75% of       esophageal circumference) esophagitis with no bleeding was found.       Biopsies were taken with a cold forceps for histology.      Diffuse and patchy moderate inflammation characterized by congestion       (edema) and erythema was found in the gastric antrum. Biopsies were       taken with a cold forceps for Helicobacter pylori testing.      Patchy moderate inflammation characterized by congestion (edema),       erosions and erythema was found in the first part of the duodenum and in       the  second part of the duodenum.      The cardia and gastric fundus were normal on retroflexion. Impression:        - LA Grade D reflux and erosive esophagitis. Biopsied.                    - Erosive gastritis. Biopsied.                    -  Erosive duodenitis. Recommendation:    - Return patient to hospital ward for ongoing care.                    - Use Protonix (pantoprazole) 40 mg IV BID for 2 days,                     then change to 40 mg po bid..                    - Repeat the upper endoscopy in 7 weeks to check healing. Procedure Code(s): --- Professional ---                    [phone], Esophagogastroduodenoscopy, flexible, transoral;                     with biopsy, single or multiple Diagnosis Code(s): --- Professional ---                    530.11, Reflux esophagitis                    530.19, Other esophagitis                    535.40, Other specified gastritis, without mention of                     hemorrhage                    535.60, Duodenitis, without mention of hemorrhage                    789.06, Abdominal pain, epigastric                    789.01, Abdominal pain, right upper quadrant                    789.02, Abdominal pain, left upper quadrant CPT copyright 2014 American Medical Association. All rights reserved. The codes documented in this report are preliminary and upon coder review may  be revised to meet current compliance requirements. [name], MD 10/20/2014 7:59:21 AM This report has been signed electronically. Number of Addenda: 0 Note Initiated On: 10/20/2014 7:10 AM      [facility]

## 2014-10-20 NOTE — Consult Note (Signed)
Subjective: Patient seen for abdominal pain. Patient states that she is having a fair amount of nausea this morning. That has been medicated. Continues with pain across the upper abdomen and some in the right lower quadrant. Is been no emesis.  Objective: Vital signs in last 24 hours: Temp:  [97.9 F (36.6 C)-99 F (37.2 C)] 99 F (37.2 C) (06/03 0652) Pulse Rate:  [59-60] 60 (06/03 0652) Resp:  [16-20] 16 (06/03 0652) BP: (114-124)/(44-48) 124/45 mmHg (06/03 0652) SpO2:  [98 %-100 %] 100 % (06/03 0652) Weight:  [76.204 kg (168 lb)] 76.204 kg (168 lb) (06/03 0652) Blood pressure 124/45, pulse 60, temperature 99 F (37.2 C), temperature source Tympanic, resp. rate 16, height 5\' 7"  (1.702 m), weight 76.204 kg (168 lb), SpO2 100 %.   Intake/Output from previous day: 06/02 0701 - 06/03 0700 In: 3591.5 [I.V.:3591.5] Out: 1275 [Urine:1275]  Intake/Output this shift:     General appearance:  81 year old Caucasian female no acute distress Resp:  Clear to auscultation Cardio:  Regular rate and rhythm without rub or gallop GI:  Soft nondistended sounds are positive normoactive. She is tender across the upper abdomen low the costal margin and into the right lower quadrant more laterally. There no masses rebound or organomegaly Extremities:  No clubbing or cyanosis   Lab Results: Results for orders placed or performed during the hospital encounter of 10/17/14 (from the past 24 hour(s))  CBC with Differential/Platelet     Status: Abnormal   Collection Time: 10/20/14  5:37 AM  Result Value Ref Range   WBC 5.6 3.6 - 11.0 K/uL   RBC 4.06 3.80 - 5.20 MIL/uL   Hemoglobin 10.8 (L) 12.0 - 16.0 g/dL   HCT 32.4 (L) 35.0 - 47.0 %   MCV 79.7 (L) 80.0 - 100.0 fL   MCH 26.7 26.0 - 34.0 pg   MCHC 33.5 32.0 - 36.0 g/dL   RDW 14.0 11.5 - 14.5 %   Platelets 121 (L) 150 - 440 K/uL   Neutrophils Relative % 65 %   Neutro Abs 3.6 1.4 - 6.5 K/uL   Lymphocytes Relative 20 %   Lymphs Abs 1.1 1.0 - 3.6  K/uL   Monocytes Relative 12 %   Monocytes Absolute 0.7 0.2 - 0.9 K/uL   Eosinophils Relative 2 %   Eosinophils Absolute 0.1 0 - 0.7 K/uL   Basophils Relative 1 %   Basophils Absolute 0.0 0 - 0.1 K/uL  Protime-INR     Status: None   Collection Time: 10/20/14  5:37 AM  Result Value Ref Range   Prothrombin Time 14.4 11.4 - 15.0 seconds   INR 1.10       Recent Labs  10/19/14 0516 10/20/14 0537  WBC 4.9 5.6  HGB 10.6* 10.8*  HCT 32.3* 32.4*  PLT 114* 121*   BMET No results for input(s): NA, K, CL, CO2, GLUCOSE, BUN, CREATININE, CALCIUM in the last 72 hours. LFT No results for input(s): PROT, ALBUMIN, AST, ALT, ALKPHOS, BILITOT, BILIDIR, IBILI in the last 72 hours. PT/INR  Recent Labs  10/20/14 0537  LABPROT 14.4  INR 1.10   Hepatitis Panel No results for input(s): HEPBSAG, HCVAB, HEPAIGM, HEPBIGM in the last 72 hours. C-Diff  Recent Labs  10/18/14 2106  CDIFFTOX NEGATIVE   No results for input(s): CDIFFPCR in the last 72 hours.   Studies/Results: No results found.  Scheduled Inpatient Medications:   . [MAR Hold] ALPRAZolam  0.5 mg Oral QHS  . [MAR Hold] ampicillin-sulbactam (UNASYN) IV  3 g Intravenous Q8H  . [MAR Hold] cholecalciferol  1,000 Units Oral Daily  . [MAR Hold] lamoTRIgine  100 mg Oral QHS  . [MAR Hold] latanoprost  1 drop Both Eyes QHS  . [MAR Hold] levothyroxine  88 mcg Oral BH-q7a  . [MAR Hold] pravastatin  20 mg Oral q1800  . [MAR Hold] vitamin B-12  1,000 mcg Oral Daily    Continuous Inpatient Infusions:   . sodium chloride 75 mL/hr at 10/20/14 0556    PRN Inpatient Medications:  [MAR Hold] acetaminophen **OR** [MAR Hold] acetaminophen, [MAR Hold] HYDROcodone-acetaminophen, [MAR Hold]  morphine injection, [MAR Hold] ondansetron (ZOFRAN) IV, [MAR Hold] promethazine, [MAR Hold] senna-docusate  Miscellaneous:   Assessment:  1. Abdominal pain and abnormal CT scan. Possible enteritis. She has been continuing to have pain despite  probiotics with this mostly being upper abdominal pain.  Plan:  1. EGD today. I have discussed the risks benefits and complications of procedures to include not limited to bleeding, infection, perforation and the risk of sedation and the patient wishes to proceed. 2. Upper GI with small bowel series later today.  [name] MD 10/20/2014, 7:25 AM

## 2014-10-20 NOTE — Progress Notes (Signed)
[facility] NAME: Stephanie Anthony    MR#:  [id number]  DATE OF BIRTH:  04-Feb-1934  SUBJECTIVE:  Feels better today Tolerating CLD. No fever Had formed BM today  REVIEW OF SYSTEMS:    Review of Systems  Constitutional: Negative for fever, chills and weight loss.  HENT: Negative for ear discharge, ear pain and nosebleeds.   Eyes: Negative for blurred vision, pain and discharge.  Respiratory: Negative for sputum production, shortness of breath, wheezing and stridor.   Cardiovascular: Negative for chest pain, palpitations, orthopnea and PND.  Gastrointestinal: Negative for nausea, vomiting, abdominal pain and diarrhea.  Genitourinary: Negative for urgency and frequency.  Musculoskeletal: Negative for back pain and joint pain.  Neurological: Negative for sensory change, speech change, focal weakness and weakness.  Psychiatric/Behavioral: Negative for depression. The patient is not nervous/anxious.   All other systems reviewed and are negative.  Tolerating Diet:yes   DRUG ALLERGIES:   Allergies  Allergen Reactions  . Aspirin Other (See Comments)    Reaction: Rectal bleeding  . Codeine Nausea And Vomiting  . Codeine Sulfate Nausea Only  . Prednisone Other (See Comments)    Reaction: Hallucinations  . Valium [Diazepam] Other (See Comments)    Reaction: Strange feelings    VITALS:  Blood pressure 108/43, pulse 49, temperature 97.8 F (36.6 C), temperature source Oral, resp. rate 18, height 5\' 7"  (1.702 m), weight 76.204 kg (168 lb), SpO2 98 %.  PHYSICAL EXAMINATION:   Physical Exam  GENERAL:  81 y.o.-year-old patient lying in the bed with no acute distress.  EYES: Pupils equal, round, reactive to light and accommodation. No scleral icterus. Extraocular muscles intact.  HEENT: Head atraumatic, normocephalic. Oropharynx and nasopharynx clear.  NECK:  Supple, no jugular venous distention. No thyroid enlargement, no  tenderness.  LUNGS: Normal breath sounds bilaterally, no wheezing, rales, rhonchi. No use of accessory muscles of respiration.  CARDIOVASCULAR: S1, S2 normal. No murmurs, rubs, or gallops.  ABDOMEN: Soft, some tenderness i upper abdomen, nondistended. Bowel sounds present. No organomegaly or mass.  EXTREMITIES: No cyanosis, clubbing or edema b/l.    NEUROLOGIC: Cranial nerves II through XII are intact. No focal Motor or sensory deficits b/l.   PSYCHIATRIC: The patient is alert and oriented x 3.  SKIN: No obvious rash, lesion, or ulcer.    LABORATORY PANEL:   CBC  Recent Labs Lab 10/20/14 0537  WBC 5.6  HGB 10.8*  HCT 32.4*  PLT 121*   Chemistries   Recent Labs Lab 10/17/14 0340  NA 142  K 4.0  CL 108  CO2 19*  GLUCOSE 167*  BUN 19  CREATININE 1.87*  CALCIUM 9.8  AST 24  ALT 12*  ALKPHOS 44  BILITOT 0.8    Cardiac Enzymes  Recent Labs Lab 10/17/14 0340  TROPONINI <0.03   ASSESSMENT AND PLAN:   81 year old Stephanie Anthony with history of bipolar disorder, coronary artery disease, hypertension comes to the emergency room with increasing abdominal pain and intractable nausea vomiting she's being admitted with  #1 acute small bowel enteritis. -Tolerating intermittently clear liquid diet. IV fluids for hydration. IV Cipro & Flagyl--->change to IV unasyn due to nasuea -appreciate GI Dr. Gustavo Lah  Input.  - EGD showedLA Grade D reflux and erosive esophagitis. - Erosive gastritis. Biopsied.- Erosive duodenitis. -IV PPI bid for now -will advance diet. Pt wants to try FLD  #2 abnormal CT of the abdomen showing bulbous fluid-filled dilatation of distal appendix which  has progressed from prior exam.  -Surgical recs noted. Pt if decides will need appendectomy  #3 hypertension Patient currently has relative hypotension I will hold off on enalapril and and Lasix.  #4 history of bipolar disorder with psychotic features Patient appears calm and cooperative cntinue her  Lamictal   #5 intractable nausea vomiting with dehydration due to acute renal failure Clear liquid diet IV fluids for hydration Monitor I's and O and creatinine  #6 DVT prophylaxis subcutaneous heparin  Case discussed with Care Management/Social Worker. Management plans discussed with the patient and in agreement.  CODE STATUS: FULL  DVT Prophylaxis: heparin  TOTAL TIME TAKING CARE OF THIS PATIENT: 25 minutes.   D/C 2-3 DAYS, DEPENDING ON CLINICAL CONDITION.   Arland Usery M.D on 10/20/2014 at 4:21 PM  Between 7am to 6pm - Pager - [phone]  After 6pm go to [email or web address] - password [email or web address] [facility]  Office  [phone]  CC: Primary care physician; [name], MD

## 2014-10-20 NOTE — Anesthesia Postprocedure Evaluation (Signed)
  Anesthesia Post-op Note  Patient: Stephanie Anthony  Procedure(s) Performed: Procedure(s): ESOPHAGOGASTRODUODENOSCOPY (EGD) WITH PROPOFOL (N/A)  Anesthesia type:General  Patient location: PACU  Post pain: Pain level controlled  Post assessment: Post-op Vital signs reviewed, Patient's Cardiovascular Status Stable, Respiratory Function Stable, Patent Airway and No signs of Nausea or vomiting  Post vital signs: Reviewed and stable  Last Vitals:  Filed Vitals:   10/20/14 0806  BP: 118/43  Pulse: 59  Temp: 36.9 C  Resp: 20    Level of consciousness: awake, alert  and patient cooperative  Complications: No apparent anesthesia complications

## 2014-10-20 NOTE — Anesthesia Procedure Notes (Signed)
Performed by: Loise Esguerra Pre-anesthesia Checklist: Patient identified, Emergency Drugs available, Suction available, Patient being monitored and Timeout performed Patient Re-evaluated:Patient Re-evaluated prior to inductionOxygen Delivery Method: Nasal cannula Intubation Type: IV induction     

## 2014-10-20 NOTE — Transfer of Care (Signed)
Immediate Anesthesia Transfer of Care Note  Patient: Stephanie Anthony  Procedure(s) Performed: Procedure(s): ESOPHAGOGASTRODUODENOSCOPY (EGD) WITH PROPOFOL (N/A)  Patient Location: PACU  Anesthesia Type:General  Level of Consciousness: awake and patient cooperative  Airway & Oxygen Therapy: Patient Spontanous Breathing and Patient connected to nasal cannula oxygen  Post-op Assessment: Report given to RN and Post -op Vital signs reviewed and stable  Post vital signs: Reviewed and stable  Last Vitals:  Filed Vitals:   10/20/14 0806  BP: 118/43  Pulse: 59  Temp: 36.9 C  Resp: 20    Complications: No apparent anesthesia complications

## 2014-10-20 NOTE — Progress Notes (Signed)
Patient is undergoing endoscopy and possible small bowel follow through today. Plan of care discussed with Dr. Anastasio Champion. We will be available to follow her after the studies are complete.

## 2014-10-20 NOTE — Progress Notes (Signed)
Called Dr Gustavo Lah to clarify protonix route and diet order. Orders received

## 2014-10-20 NOTE — Anesthesia Preprocedure Evaluation (Signed)
Anesthesia Evaluation  Patient identified by MRN, date of birth, ID band Patient awake    Reviewed: Allergy & Precautions, NPO status , Patient's Chart, lab work & pertinent test results  History of Anesthesia Complications Negative for: history of anesthetic complications  Airway Mallampati: III       Dental  (+) Caps   Pulmonary former smoker,    Pulmonary exam normal       Cardiovascular Exercise Tolerance: Poor hypertension, Pt. on medications + CAD Normal cardiovascular exam+ Valvular Problems/Murmurs MR     Neuro/Psych Depression Bipolar Disorder negative neurological ROS     GI/Hepatic negative GI ROS, Neg liver ROS,   Endo/Other  Hypothyroidism   Renal/GU   negative genitourinary   Musculoskeletal negative musculoskeletal ROS (+)   Abdominal (+)  Abdomen: tender.    Peds negative pediatric ROS (+)  Hematology  (+) anemia ,   Anesthesia Other Findings   Reproductive/Obstetrics negative OB ROS                             Anesthesia Physical Anesthesia Plan  ASA: III  Anesthesia Plan: General   Post-op Pain Management:    Induction: Intravenous  Airway Management Planned: Nasal Cannula  Additional Equipment:   Intra-op Plan:   Post-operative Plan:   Informed Consent: I have reviewed the patients History and Physical, chart, labs and discussed the procedure including the risks, benefits and alternatives for the proposed anesthesia with the patient or authorized representative who has indicated his/her understanding and acceptance.     Plan Discussed with: CRNA  Anesthesia Plan Comments:         Anesthesia Quick Evaluation

## 2014-10-21 ENCOUNTER — Inpatient Hospital Stay: Payer: Medicare Other

## 2014-10-21 DIAGNOSIS — K529 Noninfective gastroenteritis and colitis, unspecified: Secondary | ICD-10-CM

## 2014-10-21 NOTE — Progress Notes (Signed)
Subjective:   She feels better now with no diarrhea and no nausea. She has less abdominal pain and overall looks brighter  Vital signs in last 24 hours: Temp:  [97.8 F (36.6 C)-98.1 F (36.7 C)] 98.1 F (36.7 C) (06/04 0828) Pulse Rate:  [49-57] 51 (06/04 0828) Resp:  [16-18] 16 (06/04 0828) BP: (108-127)/(43-54) 127/53 mmHg (06/04 0828) SpO2:  [97 %-99 %] 99 % (06/04 0828) Last BM Date: 10/19/14  Intake/Output from previous day: 06/03 0701 - 06/04 0700 In: 1779 [P.O.:240; I.V.:1539] Out: 1100 [Urine:1100]  Exam:  Her abdomen is soft with no significant abdominal tenderness. She has no rebound or guarding.  Lab Results:  CBC  Recent Labs  10/19/14 0516 10/20/14 0537  WBC 4.9 5.6  HGB 10.6* 10.8*  HCT 32.3* 32.4*  PLT 114* 121*   CMP     Component Value Date/Time   NA 142 10/17/2014 0340   NA 143 09/11/2014 1229   K 4.0 10/17/2014 0340   K 4.8 09/11/2014 1229   CL 108 10/17/2014 0340   CL 111 09/11/2014 1229   CO2 19* 10/17/2014 0340   CO2 25 09/11/2014 1229   GLUCOSE 167* 10/17/2014 0340   GLUCOSE 97 09/11/2014 1229   BUN 19 10/17/2014 0340   BUN 22* 09/11/2014 1229   CREATININE 1.87* 10/17/2014 0340   CREATININE 1.53* 09/11/2014 1229   CALCIUM 9.8 10/17/2014 0340   CALCIUM 9.4 09/11/2014 1229   PROT 7.4 10/17/2014 0340   PROT 7.2 09/11/2014 1229   ALBUMIN 4.3 10/17/2014 0340   ALBUMIN 4.1 09/11/2014 1229   AST 24 10/17/2014 0340   AST 21 09/11/2014 1229   ALT 12* 10/17/2014 0340   ALT 13* 09/11/2014 1229   ALKPHOS 44 10/17/2014 0340   ALKPHOS 47 09/11/2014 1229   BILITOT 0.8 10/17/2014 0340   GFRNONAA 25* 10/17/2014 0340   GFRNONAA 32* 09/11/2014 1229   GFRAA 29* 10/17/2014 0340   GFRAA 37* 09/11/2014 1229   PT/INR  Recent Labs  10/20/14 0537  LABPROT 14.4  INR 1.10    Studies/Results: No results found.  Assessment/Plan: She is improved from admission. Our plan at this point would be to continue her antibiotic therapy until this  episode resolves and then plan to see her back in our office to discuss possibility of appendectomy for this bulbous appendicitis. She is in agreement with this plan.

## 2014-10-21 NOTE — Progress Notes (Addendum)
Patient A/O, no noted distress. Stool culture needed. Tolerated meds well. New IV right wrist, no noted complication. Staff will continue to monitor and meet needs.  No Promethus are only completed during week days, no weekend noted Jenn (lab). Gustavo Lah, MD notified promethus not administered on Weekend, he express must take place first thing Monday, 10/23/14.

## 2014-10-21 NOTE — Consult Note (Addendum)
Subjective: Patient seen for abdominal pain and enteritis.  Seems to have less nausea, less abdominal pain. Still loose stool.   Objective: Vital signs in last 24 hours: Temp:  [97.9 F (36.6 C)-98.5 F (36.9 C)] 98.5 F (36.9 C) 2022-11-03 1608) Pulse Rate:  [51-57] 55 November 03, 2022 1608) Resp:  [16] 16 2022/11/03 1608) BP: (94-127)/(53-75) 94/75 mmHg 03-Nov-2022 1608) SpO2:  [97 %-99 %] 99 % Nov 03, 2022 1608) Blood pressure 94/75, pulse 55, temperature 98.5 F (36.9 C), temperature source Oral, resp. rate 16, height 5\' 7"  (1.702 m), weight 76.204 kg (168 lb), SpO2 99 %.   Intake/Output from previous day: 06/03 0701 - Nov 03, 2022 0700 In: 3383 [P.O.:240; I.V.:1539] Out: 1100 [Urine:1100]  Intake/Output this shift: Total I/O In: 512 [P.O.:120; I.V.:392] Out: -    General appearance:no acute distress Resp: bcta Cardio:rrr GI:  Soft no distension, bs positive, no masses or rebound.  Mild lower abdomen discomfrt, upper abd  Much less tender.  Extremities:     Lab Results: No results found for this or any previous visit (from the past 24 hour(s)).    Recent Labs  10/19/14 0516 10/20/14 0537  WBC 4.9 5.6  HGB 10.6* 10.8*  HCT 32.3* 32.4*  PLT 114* 121*   BMET No results for input(s): NA, K, CL, CO2, GLUCOSE, BUN, CREATININE, CALCIUM in the last 72 hours. LFT No results for input(s): PROT, ALBUMIN, AST, ALT, ALKPHOS, BILITOT, BILIDIR, IBILI in the last 72 hours. PT/INR  Recent Labs  10/20/14 0537  LABPROT 14.4  INR 1.10   Hepatitis Panel No results for input(s): HEPBSAG, HCVAB, HEPAIGM, HEPBIGM in the last 72 hours. C-Diff  Recent Labs  10/18/14 2106  CDIFFTOX NEGATIVE   No results for input(s): CDIFFPCR in the last 72 hours.   Studies/Results: Dg Small Bowel  11-03-14   CLINICAL DATA:  Diarrhea. Enteritis demonstrated on CT scan dated 09/29/2014.  EXAM: SMALL BOWEL SERIES  COMPARISON:  CT scan dated 10/17/2014  TECHNIQUE: Following ingestion of thin barium, serial small bowel  images were obtained including spot views of the terminal ileum.  FLUOROSCOPY TIME:  Fluoroscopy Time (in minutes and seconds): 1 minutes 0 seconds  FINDINGS: The scout image demonstrates no dilated loops of bowel. Small bowel transit time was 30 minutes. There is marked edema of the mucosa of the distal 40 cm of the ileum. There is no obstruction. There is a prominent diverticulum in the second portion of the duodenum. Cecum appears normal. No fistulae. The area of small bowel edema seen in the left side of the abdomen on the CT scan is not appreciable on the small bowel study.  IMPRESSION: Enteritis of the distal ileum. Given the patient's lack of prior history, infectious etiology is suspected.   Electronically Signed   By: Lorriane Shire M.D.   On: Nov 03, 2014 15:24    Scheduled Inpatient Medications:   . ALPRAZolam  0.5 mg Oral QHS  . ampicillin-sulbactam (UNASYN) IV  3 g Intravenous Q12H  . cholecalciferol  1,000 Units Oral Daily  . lamoTRIgine  100 mg Oral QHS  . latanoprost  1 drop Both Eyes QHS  . levothyroxine  88 mcg Oral BH-q7a  . pantoprazole (PROTONIX) IV  40 mg Intravenous Q12H  . pravastatin  20 mg Oral q1800  . vitamin B-12  1,000 mcg Oral Daily    Continuous Inpatient Infusions:     PRN Inpatient Medications:  acetaminophen **OR** acetaminophen, HYDROcodone-acetaminophen, morphine injection, ondansetron (ZOFRAN) IV, promethazine, senna-docusate  Miscellaneous:   Assessment:  1)  gastritis, esophagitis- awaiting biopsies report 2) enteritis- noted in ileum.  Lower abdomen improving clinically.  Stool cultures were ordered, but not done per Micro.   Plan:  1) will reorder stool cultures.   2) continue iv pi, change to po tomorrow.   3) trial low residue diet tomorrow.  Continue current abx.   [name] MD 10/21/2014, 5:34 PM  Will order IBD panel.

## 2014-10-21 NOTE — Evaluation (Signed)
Physical Therapy Evaluation Patient Details Name: Stephanie Anthony MRN: [id number] DOB: Aug 10, 1933 Today's Date: 10/21/2014   History of Present Illness  Stephanie Anthony is a 81 y.o. female with a known history of bipolar disorder , hypertension, coronary artery disease, chronic mitral valve disease comes to the emergency room with increasing generalized abdominal pain. 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   Clinical Impression  81 yo Female with abdominal pain, reports feeling weak/tired. Patient was independent in all self care ADLs prior to admittance. Patient is currently modified independent in bed mobility and transfers. She ambulates without AD, supervision approximately 100 feet with unsteady gait pattern. Patient reports feeling unsteady and feels that her gait speed is different than PLOF. She would benefit from additional skilled PT intervention to improve balance/gait safety and reduce fall risk. PT recommends home health PT assessment to address safety at home. Patient agreeable.    Follow Up Recommendations Home health PT    Equipment Recommendations       Recommendations for Other Services       Precautions / Restrictions Precautions Precautions: Fall Restrictions Weight Bearing Restrictions: No      Mobility  Bed Mobility Overal bed mobility: Modified Independent             General bed mobility comments: slow, but able to transition supine<>sit without bed rails demonstrating good safety;  Transfers Overall transfer level: Independent Equipment used: None             General transfer comment: Transfers sit<>stand from bed without AD, well, no loss of balance, good safety awareness  Ambulation/Gait Ambulation/Gait assistance: Supervision Ambulation Distance (Feet): 100 Feet Assistive  device: None Gait Pattern/deviations: Step-through pattern;Narrow base of support;Shuffle;Decreased step length - right;Decreased step length - left Gait velocity: decreased   General Gait Details: Ambulates on even surface without AD, supervision demonstrating reciprocal gait pattern; slight unsteady gait with narrow base of support; Reports feeling imbalance and not feeling steady;  Stairs            Wheelchair Mobility    Modified Rankin (Stroke Patients Only)       Balance Overall balance assessment: Needs assistance             Standing balance comment: Able to stand with feet together/apart without loss of balance unsupported;able to maintain balance against min pertubations;                             Pertinent Vitals/Pain Pain Assessment: No/denies pain    Home Living Family/patient expects to be discharged to:: Private residence Living Arrangements: Other relatives (lives with her sister and nephews;) Available Help at Discharge: Family Type of Home: House Home Access: Stairs to enter Entrance Stairs-Rails: Can reach Chartered loss adjuster of Steps: 4 Home Layout: One level Home Equipment: Shower seat;Bedside commode (walker)      Prior Function Level of Independence: Independent         Comments: ambulated without AD     Hand Dominance        Extremity/Trunk Assessment   Upper Extremity Assessment: Overall WFL for tasks assessed           Lower Extremity Assessment: Overall WFL for tasks assessed         Communication   Communication: No  difficulties  Cognition Arousal/Alertness: Awake/alert Behavior During Therapy: WFL for tasks assessed/performed Overall Cognitive Status: Within Functional Limits for tasks assessed                      General Comments      Exercises        Assessment/Plan    PT Assessment Patient needs continued PT services  PT Diagnosis Difficulty walking    PT Problem List Decreased mobility;Decreased safety awareness;Decreased balance  PT Treatment Interventions Gait training;Balance training;Neuromuscular re-education;Stair training;Patient/family education;Functional mobility training;Therapeutic exercise;Therapeutic activities   PT Goals (Current goals can be found in the Care Plan section) Acute Rehab PT Goals Patient Stated Goal: "I just don't feel good. I don't know what I want." Patient did express desire to be discharged home when ready; PT Goal Formulation: With patient Time For Goal Achievement: 11/04/14 Potential to Achieve Goals: Good    Frequency Min 2X/week   Barriers to discharge Decreased caregiver support reports sister is disabled and nephew is disabled which limits their ability to assist her in ADLs    Co-evaluation               End of Session Equipment Utilized During Treatment: Gait belt Activity Tolerance: Patient tolerated treatment well Patient left: in bed;with bed alarm set;with call bell/phone within reach (per patient request) Nurse Communication: Mobility status         Time: 1520-1535 PT Time Calculation (min) (ACUTE ONLY): 15 min   Charges:   PT Evaluation $Initial PT Evaluation Tier I: 1 Procedure     PT G Codes:        Hopkins,Stephanie Anthony, PT, DPT 10/21/2014, 3:42 PM

## 2014-10-21 NOTE — Progress Notes (Signed)
[facility] NAME: Stephanie Anthony    MR#:  [id number]  DATE OF BIRTH:  Mar 19, 1934  SUBJECTIVE:  Feels better today Tolerating FLD. No fever Had formed BM today  REVIEW OF SYSTEMS:    Review of Systems  Constitutional: Negative for fever, chills and weight loss.  HENT: Negative for ear discharge, ear pain and nosebleeds.   Eyes: Negative for blurred vision, pain and discharge.  Respiratory: Negative for sputum production, shortness of breath, wheezing and stridor.   Cardiovascular: Negative for chest pain, palpitations, orthopnea and PND.  Gastrointestinal: Positive for abdominal pain. Negative for nausea, vomiting and diarrhea.  Genitourinary: Negative for urgency and frequency.  Musculoskeletal: Negative for back pain and joint pain.  Neurological: Negative for sensory change, speech change, focal weakness and weakness.  Psychiatric/Behavioral: Negative for depression. The patient is not nervous/anxious.    Tolerating Diet:yes   DRUG ALLERGIES:   Allergies  Allergen Reactions  . Aspirin Other (See Comments)    Reaction: Rectal bleeding  . Codeine Nausea And Vomiting  . Codeine Sulfate Nausea Only  . Prednisone Other (See Comments)    Reaction: Hallucinations  . Valium [Diazepam] Other (See Comments)    Reaction: Strange feelings    VITALS:  Blood pressure 127/53, pulse 51, temperature 98.1 F (36.7 C), temperature source Oral, resp. rate 16, height 5\' 7"  (1.702 m), weight 76.204 kg (168 lb), SpO2 99 %.  PHYSICAL EXAMINATION:   Physical Exam  GENERAL:  82 y.o.-year-old patient lying in the bed with no acute distress.  EYES: Pupils equal, round, reactive to light and accommodation. No scleral icterus. Extraocular muscles intact.  HEENT: Head atraumatic, normocephalic. Oropharynx and nasopharynx clear.  NECK:  Supple, no jugular venous distention. No thyroid enlargement, no tenderness.  LUNGS: Normal breath  sounds bilaterally, no wheezing, rales, rhonchi. No use of accessory muscles of respiration.  CARDIOVASCULAR: S1, S2 normal. No murmurs, rubs, or gallops.  ABDOMEN: Soft, some tenderness i upper abdomen, nondistended. Bowel sounds present. No organomegaly or mass.  EXTREMITIES: No cyanosis, clubbing or edema b/l.    NEUROLOGIC: Cranial nerves II through XII are intact. No focal Motor or sensory deficits b/l.   PSYCHIATRIC: The patient is alert and oriented x 3.  SKIN: No obvious rash, lesion, or ulcer.    LABORATORY PANEL:   CBC  Recent Labs Lab 10/20/14 0537  WBC 5.6  HGB 10.8*  HCT 32.4*  PLT 121*   Chemistries   Recent Labs Lab 10/17/14 0340  NA 142  K 4.0  CL 108  CO2 19*  GLUCOSE 167*  BUN 19  CREATININE 1.87*  CALCIUM 9.8  AST 24  ALT 12*  ALKPHOS 44  BILITOT 0.8    Cardiac Enzymes  Recent Labs Lab 10/17/14 0340  TROPONINI <0.03   ASSESSMENT AND PLAN:   56 year old Stephanie Anthony with history of bipolar disorder, coronary artery disease, hypertension comes to the emergency room with increasing abdominal pain and intractable nausea vomiting she's being admitted with  #1 acute small bowel enteritis. -Tolerating intermittently clear liquid diet. IV fluids for hydration. IV Cipro & Flagyl--->changed to IV unasyn due to nasuea -appreciate GI Dr. Gustavo Lah  Input.  - EGD showedLA Grade D reflux and erosive esophagitis. - Erosive gastritis. Biopsied.- Erosive duodenitis. -IV PPI bid for now--> change to po in am -tolerating FLD  #2 abnormal CT of the abdomen showing bulbous fluid-filled dilatation of distal appendix which has progressed from prior exam.  -  Surgical recs noted. Pt if decides will need appendectomy  #3 hypertension Patient currently has relative hypotension I will hold off on enalapril and and Lasix.  #4 history of bipolar disorder with psychotic features Patient appears calm and cooperative cntinue her Lamictal   #5 intractable nausea  vomiting with dehydration due to acute renal failure Resolved. D/c IVF  #6 DVT prophylaxis subcutaneous heparin  Case discussed with Care Management/Social Worker. Management plans discussed with the patient and in agreement.  CODE STATUS: FULL  DVT Prophylaxis: heparin  TOTAL TIME TAKING CARE OF THIS PATIENT: 25 minutes.   D/C in am if continues to show improvement. Spoke with son in the room  Stephanie Anthony M.D on 10/21/2014 at 1:04 PM  Between 7am to 6pm - Pager - [phone]  After 6pm go to [email or web address] - password [email or web address] [facility]  Office  [phone]  CC: Primary care physician; [name], MD

## 2014-10-22 DIAGNOSIS — K529 Noninfective gastroenteritis and colitis, unspecified: Secondary | ICD-10-CM

## 2014-10-22 MED ORDER — PANTOPRAZOLE SODIUM 40 MG PO TBEC
40.0000 mg | DELAYED_RELEASE_TABLET | Freq: Two times a day (BID) | ORAL | Status: DC
  Administered 2014-10-22: 40 mg via ORAL
  Filled 2014-10-22: qty 1

## 2014-10-22 MED ORDER — PANTOPRAZOLE SODIUM 40 MG PO TBEC: 40.0000 mg | DELAYED_RELEASE_TABLET | Freq: Two times a day (BID) | ORAL | Status: DC

## 2014-10-22 MED ORDER — AMOXICILLIN-POT CLAVULANATE 875-125 MG PO TABS
1.0000 | ORAL_TABLET | Freq: Two times a day (BID) | ORAL | Status: DC
  Administered 2014-10-22: 1 via ORAL
  Filled 2014-10-22: qty 1

## 2014-10-22 MED ORDER — VITAMIN D3 25 MCG (1000 UNIT) PO TABS: 1000.0000 [IU] | ORAL_TABLET | Freq: Every day | ORAL | Status: DC

## 2014-10-22 MED ORDER — HYDROCODONE-ACETAMINOPHEN 5-325 MG PO TABS: 1.0000 | ORAL_TABLET | Freq: Four times a day (QID) | ORAL | Status: DC | PRN

## 2014-10-22 NOTE — Progress Notes (Signed)
Stephan[facility] Anthony at [place] NAME: Stephanie Anthony    MR#:  [id number]  DATE OF BIRTH:  1933/08/01  SUBJECTIVE:  Feels better today Tolerating FLD. No fever  REVIEW OF SYSTEMS:    Review of Systems  Constitutional: Negative for fever, chills and weight loss.  HENT: Negative for ear discharge, ear pain and nosebleeds.   Eyes: Negative for blurred vision, pain and discharge.  Respiratory: Negative for sputum production, shortness of breath, wheezing and stridor.   Cardiovascular: Negative for chest pain, palpitations, orthopnea and PND.  Gastrointestinal: Negative for nausea, vomiting, abdominal pain and diarrhea.  Genitourinary: Negative for urgency and frequency.  Musculoskeletal: Negative for back pain and joint pain.  Neurological: Negative for sensory change, speech change, focal weakness and weakness.  Psychiatric/Behavioral: Negative for depression and hallucinations. The patient is not nervous/anxious.   All other systems reviewed and are negative.  Tolerating Diet:yes   DRUG ALLERGIES:   Allergies  Allergen Reactions  . Aspirin Other (See Comments)    Reaction: Rectal bleeding  . Codeine Nausea And Vomiting  . Codeine Sulfate Nausea Only  . Prednisone Other (See Comments)    Reaction: Hallucinations  . Valium [Diazepam] Other (See Comments)    Reaction: Strange feelings    VITALS:  Blood pressure 128/60, pulse 56, temperature 98.4 F (36.9 C), temperature source Oral, resp. rate 16, height 5\' 7"  (1.702 m), weight 76.204 kg (168 lb), SpO2 98 %.  PHYSICAL EXAMINATION:   Physical Exam  GENERAL:  81 y.o.-year-old patient lying in the bed with no acute distress.  EYES: Pupils equal, round, reactive to light and accommodation. No scleral icterus. Extraocular muscles intact.  HEENT: Head atraumatic, normocephalic. Oropharynx and nasopharynx clear.  NECK:  Supple, no jugular venous distention. No thyroid enlargement, no  tenderness.  LUNGS: Normal breath sounds bilaterally, no wheezing, rales, rhonchi. No use of accessory muscles of respiration.  CARDIOVASCULAR: S1, S2 normal. No murmurs, rubs, or gallops.  ABDOMEN: Soft, some tenderness i upper abdomen, nondistended. Bowel sounds present. No organomegaly or mass.  EXTREMITIES: No cyanosis, clubbing or edema b/l.    NEUROLOGIC: Cranial nerves II through XII are intact. No focal Motor or sensory deficits b/l.   PSYCHIATRIC: The patient is alert and oriented x 3.  SKIN: No obvious rash, lesion, or ulcer.    LABORATORY PANEL:   CBC  Recent Labs Lab 10/20/14 0537  WBC 5.6  HGB 10.8*  HCT 32.4*  PLT 121*   Chemistries   Recent Labs Lab 10/17/14 0340  NA 142  K 4.0  CL 108  CO2 19*  GLUCOSE 167*  BUN 19  CREATININE 1.87*  CALCIUM 9.8  AST 24  ALT 12*  ALKPHOS 44  BILITOT 0.8    Cardiac Enzymes  Recent Labs Lab 10/17/14 0340  TROPONINI <0.03   ASSESSMENT AND PLAN:   55 year old Stephanie Anthony with history of bipolar disorder, coronary artery disease, hypertension comes to the emergency room with increasing abdominal pain and intractable nausea vomiting she's being admitted with  #1 acute small bowel enteritis. IV Cipro & Flagyl--->changed to IV unasyn due to nausea--->augmentin -appreciate GI Dr. Gustavo Lah  help - EGD showedLA Grade D reflux and erosive esophagitis. - Erosive gastritis. Erosive duodenitis. -IV PPI bid for now--> change to po today -tolerating FLD  #2 abnormal CT of the abdomen showing bulbous fluid-filled dilatation of distal appendix which has progressed from prior exam.  -Surgical recs noted. Pt if decides will need appendectomy  #  3 hypertension -Patient currently has relative hypotension  - will hold off on enalapril and and Lasix at d/c  #4 history of bipolar disorder with psychotic features cntinue her Lamictal   #5 intractable nausea vomiting with dehydration due to acute renal failure Resolved. D/c  IVF  #6 DVT prophylaxis subcutaneous heparin  Case discussed with Care Management/Social Worker. Management plans discussed with the patient and in agreement.  CODE STATUS: FULL  DVT Prophylaxis: heparin  TOTAL TIME TAKING CARE OF THIS PATIENT: 25 minutes.   D/C in am if continues to show improvement. Spoke with son in the room  Stephanie Anthony M.D on 10/22/2014 at 10:01 AM  Between 7am to 6pm - Pager - [phone]  After 6pm go to [email or web address] - password [email or web address]  Office  [phone]  CC: Primary care physician; [name], MD

## 2014-10-22 NOTE — Progress Notes (Signed)
Pt rested quietly during evening, no acute distress. No N/V noted. IV antibiotics cont. Stool sample needed on Monday morning for promethus (send out lab). Will cont to monitor.

## 2014-10-22 NOTE — Progress Notes (Signed)
Subjective:   She continues improved. She does not have any significant abdominal pain and only had 1 episode of diarrhea over the last 24 hours. She's tentatively plan for discharge later today  Vital signs in last 24 hours: Temp:  [98.2 F (36.8 C)-98.5 F (36.9 C)] 98.4 F (36.9 C) (06/05 0736) Pulse Rate:  [55-61] 56 (06/05 0736) Resp:  [16] 16 (06/05 0736) BP: (94-128)/(50-75) 128/60 mmHg (06/05 0736) SpO2:  [97 %-99 %] 98 % (06/05 0736) Last BM Date: 11/18/14  Intake/Output from previous day: 11/18/22 0701 - 06/05 0700 In: 542 [P.O.:150; I.V.:392] Out: 600 [Urine:600]  Exam:  Abdomen is soft nontender with no rebound or guarding at the present time.  Lab Results:  CBC  Recent Labs  10/20/14 0537  WBC 5.6  HGB 10.8*  HCT 32.4*  PLT 121*   CMP     Component Value Date/Time   NA 142 10/17/2014 0340   NA 143 09/11/2014 1229   K 4.0 10/17/2014 0340   K 4.8 09/11/2014 1229   CL 108 10/17/2014 0340   CL 111 09/11/2014 1229   CO2 19* 10/17/2014 0340   CO2 25 09/11/2014 1229   GLUCOSE 167* 10/17/2014 0340   GLUCOSE 97 09/11/2014 1229   BUN 19 10/17/2014 0340   BUN 22* 09/11/2014 1229   CREATININE 1.87* 10/17/2014 0340   CREATININE 1.53* 09/11/2014 1229   CALCIUM 9.8 10/17/2014 0340   CALCIUM 9.4 09/11/2014 1229   PROT 7.4 10/17/2014 0340   PROT 7.2 09/11/2014 1229   ALBUMIN 4.3 10/17/2014 0340   ALBUMIN 4.1 09/11/2014 1229   AST 24 10/17/2014 0340   AST 21 09/11/2014 1229   ALT 12* 10/17/2014 0340   ALT 13* 09/11/2014 1229   ALKPHOS 44 10/17/2014 0340   ALKPHOS 47 09/11/2014 1229   BILITOT 0.8 10/17/2014 0340   GFRNONAA 25* 10/17/2014 0340   GFRNONAA 32* 09/11/2014 1229   GFRAA 29* 10/17/2014 0340   GFRAA 37* 09/11/2014 1229   PT/INR  Recent Labs  10/20/14 0537  LABPROT 14.4  INR 1.10    Studies/Results: Dg Small Bowel  2014/11/18   CLINICAL DATA:  Diarrhea. Enteritis demonstrated on CT scan dated 09/29/2014.  EXAM: SMALL BOWEL SERIES   COMPARISON:  CT scan dated 10/17/2014  TECHNIQUE: Following ingestion of thin barium, serial small bowel images were obtained including spot views of the terminal ileum.  FLUOROSCOPY TIME:  Fluoroscopy Time (in minutes and seconds): 1 minutes 0 seconds  FINDINGS: The scout image demonstrates no dilated loops of bowel. Small bowel transit time was 30 minutes. There is marked edema of the mucosa of the distal 40 cm of the ileum. There is no obstruction. There is a prominent diverticulum in the second portion of the duodenum. Cecum appears normal. No fistulae. The area of small bowel edema seen in the left side of the abdomen on the CT scan is not appreciable on the small bowel study.  IMPRESSION: Enteritis of the distal ileum. Given the patient's lack of prior history, infectious etiology is suspected.   Electronically Signed   By: Lorriane Shire M.D.   On: Nov 18, 2014 15:24    Assessment/Plan: We would like to see her back in a month or 2 in our office to consider elective appendectomy to address the appendiceal mass. I discussed this plan with her hopefully she will keep follow-up appointments for this problem. I'm concerned with her psychiatric history that she may not be as compliant but we do need see her back  in order to be certain that she does not have malignant complication of her appendix.

## 2014-10-22 NOTE — Discharge Summary (Signed)
[facility] [place] NAME: Stephanie Anthony    MR#:  [id number]  DATE OF BIRTH:  07/30/1933  DATE OF ADMISSION:  10/17/2014 ADMITTING PHYSICIAN: Fritzi Mandes, MD  DATE OF DISCHARGE: 10/22/2014  PRIMARY CARE PHYSICIAN: [name], MD    ADMISSION DIAGNOSIS:  Enteritis [K52.9] Lower abdominal pain [R10.30] Non-intractable vomiting with nausea, vomiting of unspecified type [R11.2]  DISCHARGE DIAGNOSIS:  * Small bowel Enteritis * [place]le * Acute Gastritis  SECONDARY DIAGNOSIS:   Past Medical History  Diagnosis Date  . Bipolar 1 disorder   . Seasonal allergies   . Murmur, cardiac   . Coronary artery disease   . Chronic mitral valve disease   . Hypertension     HOSPITAL COURSE:   81 year old Stephanie Anthony with history of bipolar disorder, coronary artery disease, hypertension comes to the emergency room with increasing abdominal pain and intractable nausea vomiting she's being admitted with  #1 acute small bowel enteritis. IV Cipro & Flagyl--->changed to IV unasyn due to nausea--->augmentin -appreciate GI Dr. Gustavo Lah help - EGD showedLA Grade D reflux and erosive esophagitis. - Erosive gastritis. Erosive duodenitis. -IV PPI bid for now--> change to po today -tolerating FLD  #2 abnormal CT of the abdomen showing bulbous fluid-filled dilatation of distal appendix which has progressed from prior exam.  -Surgical recs noted. Pt if decides will need appendectomy  #3 hypertension -Patient currently has relative hypotension  - will hold off on enalapril and and Lasix at d/c  #4 history of bipolar disorder with psychotic features cntinue her Lamictal   #5 intractable nausea vomiting with dehydration due to acute renal failure Resolved.   #6 DVT prophylaxis subcutaneous heparin  Overall stable d/c HHPT  DISCHARGE CONDITIONS:  fair  CONSULTS OBTAINED:  Treatment Team:  Dia Crawford III, MD  DRUG ALLERGIES:    Allergies  Allergen Reactions  . Aspirin Other (See Comments)    Reaction: Rectal bleeding  . Codeine Nausea And Vomiting  . Codeine Sulfate Nausea Only  . Prednisone Other (See Comments)    Reaction: Hallucinations  . Valium [Diazepam] Other (See Comments)    Reaction: Strange feelings    DISCHARGE MEDICATIONS:   Current Discharge Medication List    START taking these medications   Details  !! cholecalciferol (VITAMIN D) 1000 UNITS tablet Take 1 tablet (1,000 Units total) by mouth daily. Qty: 30 tablet, Refills: 0    HYDROcodone-acetaminophen (NORCO/VICODIN) 5-325 MG per tablet Take 1-2 tablets by mouth every 6 (six) hours as needed for moderate pain. Qty: 30 tablet, Refills: 0    pantoprazole (PROTONIX) 40 MG tablet Take 1 tablet (40 mg total) by mouth 2 (two) times daily. Qty: 60 tablet, Refills: 0     !! - Potential duplicate medications found. Please discuss with provider.    CONTINUE these medications which have NOT CHANGED   Details  ALPRAZolam (XANAX) 0.5 MG tablet Take 0.5 mg by mouth at bedtime.     !! Cholecalciferol 1000 UNITS tablet Take 1 tablet by mouth daily.    lamoTRIgine (LAMICTAL) 100 MG tablet Take 1 tablet by mouth at bedtime.     latanoprost (XALATAN) 0.005 % ophthalmic solution Place 1 drop into both eyes at bedtime.    levothyroxine (SYNTHROID, LEVOTHROID) 88 MCG tablet Take 1 tablet by mouth daily.    lovastatin (MEVACOR) 20 MG tablet Take 2 tablets by mouth daily.     vitamin B-12 (CYANOCOBALAMIN) 1000 MCG tablet Take 1,000 mcg by mouth  daily.     !! - Potential duplicate medications found. Please discuss with provider.    STOP taking these medications     enalapril (VASOTEC) 2.5 MG tablet      furosemide (LASIX) 20 MG tablet        If you experience worsening of your admission symptoms, develop shortness of breath, life threatening emergency, suicidal or homicidal thoughts you must seek medical attention immediately by calling 911  or calling your MD immediately  if symptoms less severe.  You Must read complete instructions/literature along with all the possible adverse reactions/side effects for all the Medicines you take and that have been prescribed to you. Take any new Medicines after you have completely understood and accept all the possible adverse reactions/side effects.   Please note  You were cared for by a hospitalist during your hospital stay. If you have any questions about your discharge medications or the care you received while you were in the hospital after you are discharged, you can call the unit and asked to speak with the hospitalist on call if the hospitalist that took care of you is not available. Once you are discharged, your primary care physician will handle any further medical issues. Please note that NO REFILLS for any discharge medications will be authorized once you are discharged, as it is imperative that you return to your primary care physician (or establish a relationship with a primary care physician if you do not have one) for your aftercare needs so that they can reassess your need for medications and monitor your lab values. CBC   Recent Labs Lab 10/20/14 0537  WBC 5.6  HGB 10.8*  HCT 32.4*  PLT 121*    Chemistries   Recent Labs Lab 10/17/14 0340  NA 142  K 4.0  CL 108  CO2 19*  GLUCOSE 167*  BUN 19  CREATININE 1.87*  CALCIUM 9.8  AST 24  ALT 12*  ALKPHOS 44  BILITOT 0.8  Microbiology Results   Recent Results (from the past 240 hour(s))  C difficile quick scan w PCR reflex [facility])     Status: None   Collection Time: 10/18/14  9:06 PM  Result Value Ref Range Status   C Diff antigen NEGATIVE  Final   C Diff toxin NEGATIVE  Final   C Diff interpretation Negative for C. difficile  Final    RADIOLOGY:  Dg Small Bowel  10/21/2014   CLINICAL DATA:  Diarrhea. Enteritis demonstrated on CT scan dated 09/29/2014.  EXAM: SMALL BOWEL SERIES  COMPARISON:  CT scan dated  10/17/2014  TECHNIQUE: Following ingestion of thin barium, serial small bowel images were obtained including spot views of the terminal ileum.  FLUOROSCOPY TIME:  Fluoroscopy Time (in minutes and seconds): 1 minutes 0 seconds  FINDINGS: The scout image demonstrates no dilated loops of bowel. Small bowel transit time was 30 minutes. There is marked edema of the mucosa of the distal 40 cm of the ileum. There is no obstruction. There is a prominent diverticulum in the second portion of the duodenum. Cecum appears normal. No fistulae. The area of small bowel edema seen in the left side of the abdomen on the CT scan is not appreciable on the small bowel study.  IMPRESSION: Enteritis of the distal ileum. Given the patient's lack of prior history, infectious etiology is suspected.   Electronically Signed   By: Lorriane Shire M.D.   On: 10/21/2014 15:24     Management plans discussed with the patient,  family and they are in agreement.  CODE STATUS:     Code Status Orders        Start     Ordered   10/17/14 0736  Full code   Continuous     10/17/14 0739      TOTAL TIME TAKING CARE OF THIS PATIENT: 40 minutes.    Emmilyn Crooke M.D on 10/22/2014 at 10:13 AM  Between 7am to 6pm - Pager - [phone] After 6pm go to [email or web address] - password [email or web address] [facility]  Office  [phone]  CC: Primary care physician; [name], MD

## 2014-10-22 NOTE — Care Management Note (Signed)
Case Management Note  Patient Details  Name: GITTEL MCCAMISH MRN: [id number] Date of Birth: April 11, 1934  Subjective/Objective:            Referral faxed to [facility] requesting  PT.   [facility] will call Ms Mamone to set up an appointment to see her at her home within 48 hours.  [place] does service the [facility] area.       Expected Discharge Date:  10/19/14               Expected Discharge Plan:  Home/Self Care  In-House Referral:     Discharge planning Services  CM Consult  Post Acute Care Choice:    Choice offered to:  Patient  DME Arranged:    DME Agency:     HH Arranged:    [place] Agency:  [facility]  Status of Service:     Medicare Important Message Given:  Yes Date Medicare IM Given:  10/20/14 Medicare IM give by:  Theodoro Kalata NCM Date Additional Medicare IM Given:    Additional Medicare Important Message give by:     If discussed at [place] of Stay Meetings, dates discussed:    Additional Comments:  Duane Earnshaw A, RN 10/22/2014, 11:49 AM

## 2014-10-23 LAB — SURGICAL PATHOLOGY

## 2014-10-25 ENCOUNTER — Encounter: Payer: Self-pay | Admitting: Gastroenterology

## 2014-11-01 ENCOUNTER — Ambulatory Visit: Payer: Self-pay | Admitting: Surgery

## 2014-11-02 ENCOUNTER — Other Ambulatory Visit
Admission: RE | Admit: 2014-11-02 | Discharge: 2014-11-02 | Disposition: A | Discharge status: Home / Self Care | Payer: Medicare Other | Source: Ambulatory Visit | Attending: Gastroenterology | Admitting: Gastroenterology

## 2014-11-16 DIAGNOSIS — F314 Bipolar disorder, current episode depressed, severe, without psychotic features: Secondary | ICD-10-CM | POA: Insufficient documentation

## 2014-11-30 ENCOUNTER — Ambulatory Visit: Payer: Self-pay | Admitting: Surgery

## 2014-12-06 ENCOUNTER — Other Ambulatory Visit: Payer: Self-pay

## 2014-12-11 ENCOUNTER — Encounter: Payer: Self-pay | Admitting: Surgery

## 2014-12-11 ENCOUNTER — Ambulatory Visit (INDEPENDENT_AMBULATORY_CARE_PROVIDER_SITE_OTHER): Payer: Medicare Other | Admitting: Surgery

## 2014-12-11 VITALS — BP 131/65 | HR 78 | Temp 98.2°F | Ht 67.0 in | Wt 169.0 lb

## 2014-12-11 DIAGNOSIS — D121 Benign neoplasm of appendix: Secondary | ICD-10-CM | POA: Diagnosis not present

## 2014-12-11 NOTE — Progress Notes (Signed)
Surgical Consultation  12/11/2014  Stephanie Anthony is an 81 y.o. female. Admitted to the hospital with episode of enteritis etiology unknown. CT scan during the workup identified a bulbous tip to her appendix at 2.3 cm in size. There was not evidence of significant appendiceal infection and this finding was felt to be a tumor or mucocele of the appendix. Her symptoms have resolved since hospitalization referred to the office for further evaluation.  Chief Complaint  Patient presents with  . Follow-up    ED F/U appendix enlargment     HPI: She has done well since discharge. She does have some intermittent abdominal discomfort but has not had any significant abdominal pain. She has no diarrhea. She does not have any nausea or vomiting.  She has some psychiatric problems and has been evaluated by psychiatrist. She is frustrated with some of her medical care at this point. She recently saw the gastroenterologist who recommended she consider an elective appendectomy to rule out a malignant tumor in her appendix.  Past Medical History  Diagnosis Date  . Bipolar 1 disorder   . Seasonal allergies   . Murmur, cardiac   . Coronary artery disease   . Chronic mitral valve disease   . Hypertension   . Chronic kidney disease     Past Surgical History  Procedure Laterality Date  . Thyroidectomy    . Cholecystectomy    . Esophagogastroduodenoscopy (egd) with propofol N/A 10/20/2014    Procedure: ESOPHAGOGASTRODUODENOSCOPY (EGD) WITH PROPOFOL;  Surgeon: [name], MD;  Location: [facility] ENDOSCOPY;  Service: Endoscopy;  Laterality: N/A;    Family History  Problem Relation Age of Onset  . Heart Problems Mother   . Seizures Mother   . Stroke Mother   . Diverticulitis Mother   . Heart attack Father   . Heart attack Sister   . Pancreatitis Sister   . Cancer Sister   . Cancer Brother     Social History:  reports that she has never smoked. She has never used smokeless tobacco. She reports  that she does not drink alcohol or use illicit drugs.  Allergies:  Allergies  Allergen Reactions  . Aspirin Other (See Comments)    Reaction: Rectal bleeding  . Codeine Nausea And Vomiting  . Codeine Sulfate Nausea Only  . Prednisone Other (See Comments)    Reaction: Hallucinations  . Valium [Diazepam] Other (See Comments)    Reaction: Strange feelings    Medications reviewed.     Review of Systems  Constitutional: Negative for fever and chills.  HENT: Negative.   Eyes: Negative.   Respiratory: Negative for cough and shortness of breath.   Cardiovascular: Positive for leg swelling. Negative for chest pain and palpitations.  Gastrointestinal: Positive for abdominal pain. Negative for nausea and vomiting.  Genitourinary: Negative.   Musculoskeletal: Positive for back pain.  Skin: Negative.   Neurological: Negative.   Psychiatric/Behavioral: Positive for depression. The patient is nervous/anxious.        BP 131/65 mmHg  Pulse 78  Temp(Src) 98.2 F (36.8 C) (Oral)  Ht 5\' 7"  (1.702 m)  Wt 169 lb (76.658 kg)  BMI 26.46 kg/m2  Physical Exam  Constitutional: She is oriented to person, place, and time and well-developed, well-nourished, and in no distress.  HENT:  Head: Normocephalic and atraumatic.  Eyes: Conjunctivae are normal. Pupils are equal, round, and reactive to light.  Neck: Normal range of motion. Neck supple.  Cardiovascular: Regular rhythm and normal heart sounds.   Pulmonary/Chest:  Effort normal.  Abdominal: Soft. Bowel sounds are normal. There is no tenderness. There is no guarding.  Musculoskeletal: Normal range of motion. She exhibits edema.  Neurological: She is alert and oriented to person, place, and time.  Skin: Skin is warm and dry.  Psychiatric:  He has a very flat affect but otherwise appears appropriate. She is well oriented.      No results found for this or any previous visit (from the past 48 hour(s)). No results  found.  Assessment/Plan: 1. Benign tumor of appendix I talked with her at length. We are not certain about the benign or malignant properties of this lesion. However, at 81 years of age surgery represents a big intervention. She will does not want to consider surgery at this point. She is tired of seeing so many physicians and is feeling better overall. If indeed this were carcinoid or mucocele or even a primary adenocarcinoma MIP years before and impacted her general health. We agreed to follow her up in 3 months time with repeat CT scan rather than consider surgery at this time. She was comfortable with that option and would prefer to surgical intervention.   Dia Crawford III dermatitis

## 2014-12-11 NOTE — Patient Instructions (Signed)
Give [place] a call if you have any questions.

## 2014-12-20 ENCOUNTER — Encounter: Payer: Self-pay | Admitting: *Deleted

## 2014-12-26 ENCOUNTER — Emergency Department
Admission: EM | Admit: 2014-12-26 | Discharge: 2014-12-26 | Disposition: A | Discharge status: Home / Self Care | Payer: Medicare Other | Attending: Emergency Medicine | Admitting: Emergency Medicine

## 2014-12-26 ENCOUNTER — Emergency Department: Payer: Medicare Other

## 2014-12-26 ENCOUNTER — Other Ambulatory Visit: Payer: Self-pay

## 2014-12-26 ENCOUNTER — Encounter: Payer: Self-pay | Admitting: Emergency Medicine

## 2014-12-26 DIAGNOSIS — Z87891 Personal history of nicotine dependence: Secondary | ICD-10-CM | POA: Diagnosis not present

## 2014-12-26 DIAGNOSIS — I10 Essential (primary) hypertension: Secondary | ICD-10-CM | POA: Diagnosis not present

## 2014-12-26 DIAGNOSIS — R200 Anesthesia of skin: Secondary | ICD-10-CM | POA: Diagnosis present

## 2014-12-26 DIAGNOSIS — N390 Urinary tract infection, site not specified: Secondary | ICD-10-CM | POA: Diagnosis not present

## 2014-12-26 LAB — BASIC METABOLIC PANEL
ANION GAP: 14 (ref 5–15)
BUN: 26 mg/dL — AB (ref 6–20)
CHLORIDE: 102 mmol/L (ref 101–111)
CO2: 18 mmol/L — ABNORMAL LOW (ref 22–32)
Calcium: 9.1 mg/dL (ref 8.9–10.3)
Creatinine, Ser: 1.69 mg/dL — ABNORMAL HIGH (ref 0.44–1.00)
GFR calc Af Amer: 32 mL/min — ABNORMAL LOW (ref >60)
GFR calc non Af Amer: 28 mL/min — ABNORMAL LOW (ref >60)
Glucose, Bld: 82 mg/dL (ref 65–99)
Potassium: 4 mmol/L (ref 3.5–5.1)
SODIUM: 134 mmol/L — AB (ref 135–145)

## 2014-12-26 LAB — CBC WITH DIFFERENTIAL/PLATELET
Basophils Absolute: 0.1 10*3/uL (ref 0–0.1)
Basophils Relative: 1 %
EOS ABS: 0.1 10*3/uL (ref 0–0.7)
Eosinophils Relative: 2 %
HCT: 40.3 % (ref 35.0–47.0)
Hemoglobin: 13.6 g/dL (ref 12.0–16.0)
LYMPHS ABS: 1.8 10*3/uL (ref 1.0–3.6)
Lymphocytes Relative: 28 %
MCH: 26 pg (ref 26.0–34.0)
MCHC: 33.7 g/dL (ref 32.0–36.0)
MCV: 77.2 fL — ABNORMAL LOW (ref 80.0–100.0)
Monocytes Absolute: 0.6 10*3/uL (ref 0.2–0.9)
Monocytes Relative: 10 %
NEUTROS ABS: 3.9 10*3/uL (ref 1.4–6.5)
Neutrophils Relative %: 59 %
PLATELETS: 170 10*3/uL (ref 150–440)
RBC: 5.23 MIL/uL — AB (ref 3.80–5.20)
RDW: 13.8 % (ref 11.5–14.5)
WBC: 6.5 10*3/uL (ref 3.6–11.0)

## 2014-12-26 LAB — URINALYSIS COMPLETE WITH MICROSCOPIC (ARMC ONLY)
BILIRUBIN URINE: NEGATIVE
Bacteria, UA: NONE SEEN
GLUCOSE, UA: NEGATIVE mg/dL
Nitrite: NEGATIVE
Protein, ur: NEGATIVE mg/dL
RBC / HPF: NONE SEEN RBC/hpf (ref 0–5)
SPECIFIC GRAVITY, URINE: 1.005 (ref 1.005–1.030)
pH: 5 (ref 5.0–8.0)

## 2014-12-26 LAB — TROPONIN I: Troponin I: <0.03 ng/mL (ref <0.031)

## 2014-12-26 MED ORDER — CEPHALEXIN 500 MG PO CAPS: 500.0000 mg | ORAL_CAPSULE | Freq: Three times a day (TID) | ORAL | Status: AC

## 2014-12-26 NOTE — ED Provider Notes (Signed)
[facility] Emergency Department Provider Note    ____________________________________________  Time seen: 1410  I have reviewed the triage vital signs and the nursing notes.   HISTORY  Chief Complaint Numbness   History limited by: Not Limited   HPI Stephanie Anthony is a 81 y.o. female who presents to the emergency department because of "feeling unwell" the patient has a hard time verbalizing exactly what she means but does states she did feel numbness over her bilateral face bilateral arms and legs. Patient states these symptoms started this morning. They have been constant however she states that those have gotten somewhat better. She denies any chest pain or shortness of breath. Denies any recent fevers. She denies any SI.   Past Medical History  Diagnosis Date  . Bipolar 1 disorder   . Seasonal allergies   . Murmur, cardiac   . Coronary artery disease   . Chronic mitral valve disease   . Hypertension   . Chronic kidney disease     stage 3  . Deviated nasal septum   . Renal cyst, left     bilateral  . Nephrogenic diabetes insipidus     Patient Active Problem List   Diagnosis Date Noted  . Enteritis 10/17/2014  . Bipolar disorder, current episode depressed, severe, without psychotic features   . Bipolar affective disorder, current episode depressed 09/25/2014    Past Surgical History  Procedure Laterality Date  . Thyroidectomy    . Cholecystectomy    . Esophagogastroduodenoscopy (egd) with propofol N/A 10/20/2014    Procedure: ESOPHAGOGASTRODUODENOSCOPY (EGD) WITH PROPOFOL;  Surgeon: [name], MD;  Location: [facility] ENDOSCOPY;  Service: Endoscopy;  Laterality: N/A;  . Cardiac catheterization    . Knee replacement, right      Current Outpatient Rx  Name  Route  Sig  Dispense  Refill  . ALPRAZolam (XANAX) 0.25 MG tablet   Oral   Take 1 tablet by mouth at bedtime as needed.         [name] buPROPion (WELLBUTRIN SR) 100 MG 12 hr tablet                . cholecalciferol (VITAMIN D) 1000 UNITS tablet   Oral   Take 1 tablet (1,000 Units total) by mouth daily.   30 tablet   0   . enalapril (VASOTEC) 2.5 MG tablet               . HYDROcodone-acetaminophen (NORCO/VICODIN) 5-325 MG per tablet   Oral   Take by mouth.         . lamoTRIgine (LAMICTAL) 100 MG tablet   Oral   Take 1 tablet by mouth at bedtime.          [name] latanoprost (XALATAN) 0.005 % ophthalmic solution   Both Eyes   Place 1 drop into both eyes at bedtime.         [name] levothyroxine (SYNTHROID, LEVOTHROID) 100 MCG tablet   Oral   Take by mouth.         . lovastatin (MEVACOR) 40 MG tablet      Take 1 tablet (40 mg total) by mouth nightly.      6   . pantoprazole (PROTONIX) 40 MG tablet   Oral   Take by mouth.         . risperiDONE (RISPERDAL) 0.25 MG tablet   Oral   Take by mouth.         . vitamin B-12 (CYANOCOBALAMIN) 1000  MCG tablet   Oral   Take 1,000 mcg by mouth daily.           Allergies Aspirin; Codeine; Codeine sulfate; Prednisone; and Valium  Family History  Problem Relation Age of Onset  . Heart Problems Mother   . Seizures Mother   . Stroke Mother   . Diverticulitis Mother   . Heart attack Father   . Heart attack Sister   . Pancreatitis Sister   . Cancer Sister   . Cancer Brother     Social History History  Substance Use Topics  . Smoking status: Former Research scientist (life sciences)  . Smokeless tobacco: Never Used     Comment: quit 1964  . Alcohol Use: No    Review of Systems  Constitutional: Negative for fever. Cardiovascular: Negative for chest pain. Respiratory: Negative for shortness of breath. Gastrointestinal: Negative for abdominal pain, vomiting and diarrhea. Genitourinary: Negative for dysuria. Musculoskeletal: Negative for back pain. Skin: Negative for rash. Neurological: Negative for headaches, focal weakness.    10-point ROS otherwise  negative.  ____________________________________________   PHYSICAL EXAM:  VITAL SIGNS: ED Triage Vitals  Enc Vitals Group     BP 12/26/14 1342 153/76 mmHg     Pulse Rate 12/26/14 1342 66     Resp 12/26/14 1342 13     Temp 12/26/14 1342 98.2 F (36.8 C)     Temp Source 12/26/14 1342 Oral     SpO2 12/26/14 1342 100 %     Weight 12/26/14 1342 168 lb (76.204 kg)     Height 12/26/14 1342 5\' 7"  (1.702 m)   Constitutional: Alert and oriented. Well appearing and in no distress. Eyes: Conjunctivae are normal. PERRL. Normal extraocular movements. ENT   Head: Normocephalic and atraumatic.   Nose: No congestion/rhinnorhea.   Mouth/Throat: Mucous membranes are moist.   Neck: No stridor. Hematological/Lymphatic/Immunilogical: No cervical lymphadenopathy. Cardiovascular: Normal rate, regular rhythm.  No murmurs, rubs, or gallops. Respiratory: Normal respiratory effort without tachypnea nor retractions. Breath sounds are clear and equal bilaterally. No wheezes/rales/rhonchi. Gastrointestinal: Soft and nontender. No distention.  Genitourinary: Deferred Musculoskeletal: Normal range of motion in all extremities. No joint effusions.  No lower extremity tenderness nor edema. Neurologic:  Normal speech and language. No gross focal neurologic deficits are appreciated. Speech is normal.  Skin:  Skin is warm, dry and intact. No rash noted. Psychiatric: Mood and affect are normal. Speech and behavior are normal. Patient exhibits appropriate insight and judgment.  ____________________________________________    LABS (pertinent positives/negatives)  Labs Reviewed  CBC WITH DIFFERENTIAL/PLATELET - Abnormal; Notable for the following:    RBC 5.23 (*)    MCV 77.2 (*)    All other components within normal limits  BASIC METABOLIC PANEL - Abnormal; Notable for the following:    Sodium 134 (*)    CO2 18 (*)    BUN 26 (*)    Creatinine, Ser 1.69 (*)    GFR calc non Af Amer 28 (*)    GFR  calc Af Amer 32 (*)    All other components within normal limits  URINALYSIS COMPLETEWITH MICROSCOPIC ([facility] ONLY) - Abnormal; Notable for the following:    Color, Urine STRAW (*)    APPearance CLEAR (*)    Ketones, ur 1+ (*)    Hgb urine dipstick 1+ (*)    Leukocytes, UA TRACE (*)    Squamous Epithelial / LPF 0-5 (*)    All other components within normal limits  TROPONIN I     ____________________________________________  EKG  I, Nance Pear, attending physician, personally viewed and interpreted this EKG  EKG Time: 1346 Rate: 63 Rhythm: normal sinus rhythm Axis: normal Intervals: qtc 437 QRS: narrow ST changes: no st elevation   ____________________________________________    RADIOLOGY  CXR  IMPRESSION: Emphysema. No acute cardiopulmonary abnormality.  ____________________________________________   PROCEDURES  Procedure(s) performed: None  Critical Care performed: No  ____________________________________________   INITIAL IMPRESSION / ASSESSMENT AND PLAN / ED COURSE  Pertinent labs & imaging results that were available during my care of the patient were reviewed by me and considered in my medical decision making (see chart for details).  Patient presents to the emergency department for feeling unwell. Blood work without any concerning findings. It does appear the patient has a urinary tract infection. Physical exam is benign. Will treat patient with antibiotics for the UTI.  ____________________________________________   FINAL CLINICAL IMPRESSION(S) / ED DIAGNOSES  Final diagnoses:  UTI (lower urinary tract infection)     Nance Pear, MD 12/27/14 1335

## 2014-12-26 NOTE — ED Notes (Signed)
Pt c/o facial, lip, and BLE numbness that started today.  She did not want to come to hospital.  EMS reports she told them she wanted to stay at home and die.

## 2014-12-26 NOTE — Discharge Instructions (Signed)
Please seek medical attention for any high fevers, chest pain, shortness of breath, change in behavior, persistent vomiting, bloody stool or any other new or concerning symptoms. ° °Urinary Tract Infection °Urinary tract infections (UTIs) can develop anywhere along your urinary tract. Your urinary tract is your body's drainage system for removing wastes and extra water. Your urinary tract includes two kidneys, two ureters, a bladder, and a urethra. Your kidneys are a pair of bean-shaped organs. Each kidney is about the size of your fist. They are located below your ribs, one on each side of your spine. °CAUSES °Infections are caused by microbes, which are microscopic organisms, including fungi, viruses, and bacteria. These organisms are so small that they can only be seen through a microscope. Bacteria are the microbes that most commonly cause UTIs. °SYMPTOMS  °Symptoms of UTIs may vary by age and gender of the patient and by the location of the infection. Symptoms in young women typically include a frequent and intense urge to urinate and a painful, burning feeling in the bladder or urethra during urination. Older women and men are more likely to be tired, shaky, and weak and have muscle aches and abdominal pain. A fever may mean the infection is in your kidneys. Other symptoms of a kidney infection include pain in your back or sides below the ribs, nausea, and vomiting. °DIAGNOSIS °To diagnose a UTI, your caregiver will ask you about your symptoms. Your caregiver also will ask to provide a urine sample. The urine sample will be tested for bacteria and white blood cells. White blood cells are made by your body to help fight infection. °TREATMENT  °Typically, UTIs can be treated with medication. Because most UTIs are caused by a bacterial infection, they usually can be treated with the use of antibiotics. The choice of antibiotic and length of treatment depend on your symptoms and the type of bacteria causing your  infection. °HOME CARE INSTRUCTIONS °· If you were prescribed antibiotics, take them exactly as your caregiver instructs you. Finish the medication even if you feel better after you have only taken some of the medication. °· Drink enough water and fluids to keep your urine clear or pale yellow. °· Avoid caffeine, tea, and carbonated beverages. They tend to irritate your bladder. °· Empty your bladder often. Avoid holding urine for long periods of time. °· Empty your bladder before and after sexual intercourse. °· After a bowel movement, women should cleanse from front to back. Use each tissue only once. °SEEK MEDICAL CARE IF:  °· You have back pain. °· You develop a fever. °· Your symptoms do not begin to resolve within 3 days. °SEEK IMMEDIATE MEDICAL CARE IF:  °· You have severe back pain or lower abdominal pain. °· You develop chills. °· You have nausea or vomiting. °· You have continued burning or discomfort with urination. °MAKE SURE YOU:  °· Understand these instructions. °· Will watch your condition. °· Will get help right away if you are not doing well or get worse. °Document Released: 02/12/2005 Document Revised: 11/04/2011 Document Reviewed: 06/13/2011 °Exit[employer]® Patient Information ©2015 [employer]. This information is not intended to replace advice given to you by your health care provider. Make sure you discuss any questions you have with your health care provider. ° °

## 2014-12-26 NOTE — ED Notes (Signed)
Patient transported to X-ray 

## 2015-01-01 ENCOUNTER — Encounter: Payer: Self-pay | Admitting: Urology

## 2015-01-01 ENCOUNTER — Ambulatory Visit: Payer: Self-pay | Admitting: Urology

## 2015-01-02 ENCOUNTER — Encounter: Payer: Self-pay | Admitting: *Deleted

## 2015-01-02 ENCOUNTER — Emergency Department
Admission: EM | Admit: 2015-01-02 | Discharge: 2015-01-05 | Disposition: A | Discharge status: Other Acute Inpatient Hospital | Payer: Medicare Other | Attending: Emergency Medicine | Admitting: Emergency Medicine

## 2015-01-02 DIAGNOSIS — F315 Bipolar disorder, current episode depressed, severe, with psychotic features: Secondary | ICD-10-CM | POA: Diagnosis not present

## 2015-01-02 DIAGNOSIS — Y998 Other external cause status: Secondary | ICD-10-CM | POA: Diagnosis not present

## 2015-01-02 DIAGNOSIS — N189 Chronic kidney disease, unspecified: Secondary | ICD-10-CM | POA: Insufficient documentation

## 2015-01-02 DIAGNOSIS — I129 Hypertensive chronic kidney disease with stage 1 through stage 4 chronic kidney disease, or unspecified chronic kidney disease: Secondary | ICD-10-CM | POA: Insufficient documentation

## 2015-01-02 DIAGNOSIS — Z79899 Other long term (current) drug therapy: Secondary | ICD-10-CM | POA: Diagnosis not present

## 2015-01-02 DIAGNOSIS — Z87440 Personal history of urinary (tract) infections: Secondary | ICD-10-CM | POA: Diagnosis not present

## 2015-01-02 DIAGNOSIS — X781XXA Intentional self-harm by knife, initial encounter: Secondary | ICD-10-CM | POA: Diagnosis not present

## 2015-01-02 DIAGNOSIS — Y9389 Activity, other specified: Secondary | ICD-10-CM | POA: Diagnosis not present

## 2015-01-02 DIAGNOSIS — R5383 Other fatigue: Secondary | ICD-10-CM | POA: Diagnosis not present

## 2015-01-02 DIAGNOSIS — F314 Bipolar disorder, current episode depressed, severe, without psychotic features: Secondary | ICD-10-CM | POA: Diagnosis present

## 2015-01-02 DIAGNOSIS — S61512A Laceration without foreign body of left wrist, initial encounter: Secondary | ICD-10-CM | POA: Insufficient documentation

## 2015-01-02 DIAGNOSIS — Y9289 Other specified places as the place of occurrence of the external cause: Secondary | ICD-10-CM | POA: Insufficient documentation

## 2015-01-02 DIAGNOSIS — Z87891 Personal history of nicotine dependence: Secondary | ICD-10-CM | POA: Insufficient documentation

## 2015-01-02 DIAGNOSIS — R45851 Suicidal ideations: Secondary | ICD-10-CM | POA: Diagnosis present

## 2015-01-02 LAB — CBC
HEMATOCRIT: 43 % (ref 35.0–47.0)
Hemoglobin: 14.2 g/dL (ref 12.0–16.0)
MCH: 25.9 pg — ABNORMAL LOW (ref 26.0–34.0)
MCHC: 33.1 g/dL (ref 32.0–36.0)
MCV: 78.1 fL — AB (ref 80.0–100.0)
Platelets: 174 10*3/uL (ref 150–440)
RBC: 5.5 MIL/uL — ABNORMAL HIGH (ref 3.80–5.20)
RDW: 13.6 % (ref 11.5–14.5)
WBC: 9.2 10*3/uL (ref 3.6–11.0)

## 2015-01-02 LAB — URINALYSIS COMPLETE WITH MICROSCOPIC (ARMC ONLY)
Bacteria, UA: NONE SEEN
Bilirubin Urine: NEGATIVE
GLUCOSE, UA: NEGATIVE mg/dL
Leukocytes, UA: NEGATIVE
Nitrite: NEGATIVE
PROTEIN: NEGATIVE mg/dL
Specific Gravity, Urine: 1.011 (ref 1.005–1.030)
pH: 5 (ref 5.0–8.0)

## 2015-01-02 LAB — URINE DRUG SCREEN, QUALITATIVE (ARMC ONLY)
AMPHETAMINES, UR SCREEN: NOT DETECTED
BARBITURATES, UR SCREEN: NOT DETECTED
BENZODIAZEPINE, UR SCRN: NOT DETECTED
Cannabinoid 50 Ng, Ur ~~LOC~~: NOT DETECTED
Cocaine Metabolite,Ur ~~LOC~~: NOT DETECTED
MDMA (Ecstasy)Ur Screen: NOT DETECTED
Methadone Scn, Ur: NOT DETECTED
OPIATE, UR SCREEN: NOT DETECTED
PHENCYCLIDINE (PCP) UR S: NOT DETECTED
Tricyclic, Ur Screen: NOT DETECTED

## 2015-01-02 LAB — COMPREHENSIVE METABOLIC PANEL
ALBUMIN: 4.4 g/dL (ref 3.5–5.0)
ALK PHOS: 49 U/L (ref 38–126)
ALT: 13 U/L — ABNORMAL LOW (ref 14–54)
AST: 26 U/L (ref 15–41)
Anion gap: 15 (ref 5–15)
BILIRUBIN TOTAL: 1.2 mg/dL (ref 0.3–1.2)
BUN: 29 mg/dL — AB (ref 6–20)
CALCIUM: 9.6 mg/dL (ref 8.9–10.3)
CO2: 19 mmol/L — ABNORMAL LOW (ref 22–32)
CREATININE: 1.87 mg/dL — AB (ref 0.44–1.00)
Chloride: 105 mmol/L (ref 101–111)
GFR calc Af Amer: 29 mL/min — ABNORMAL LOW (ref >60)
GFR calc non Af Amer: 25 mL/min — ABNORMAL LOW (ref >60)
GLUCOSE: 78 mg/dL (ref 65–99)
Potassium: 4.1 mmol/L (ref 3.5–5.1)
Sodium: 139 mmol/L (ref 135–145)
TOTAL PROTEIN: 7.4 g/dL (ref 6.5–8.1)

## 2015-01-02 LAB — SALICYLATE LEVEL: Salicylate Lvl: <4 mg/dL (ref 2.8–30.0)

## 2015-01-02 LAB — ETHANOL: Alcohol, Ethyl (B): 7 mg/dL — ABNORMAL HIGH (ref <5)

## 2015-01-02 LAB — ACETAMINOPHEN LEVEL: Acetaminophen (Tylenol), Serum: <10 ug/mL — ABNORMAL LOW (ref 10–30)

## 2015-01-02 MED ORDER — PRAVASTATIN SODIUM 40 MG PO TABS
40.0000 mg | ORAL_TABLET | Freq: Every day | ORAL | Status: DC
  Administered 2015-01-02 – 2015-01-03 (×2): 40 mg via ORAL
  Filled 2015-01-02 (×4): qty 1

## 2015-01-02 MED ORDER — ENALAPRIL MALEATE 2.5 MG PO TABS
2.5000 mg | ORAL_TABLET | Freq: Every day | ORAL | Status: DC
  Administered 2015-01-02 – 2015-01-03 (×2): 2.5 mg via ORAL
  Filled 2015-01-02 (×4): qty 1

## 2015-01-02 MED ORDER — SODIUM CHLORIDE 0.9 % IV BOLUS (SEPSIS)
1000.0000 mL | Freq: Once | INTRAVENOUS | Status: AC
  Administered 2015-01-02: 1000 mL via INTRAVENOUS

## 2015-01-02 MED ORDER — BUPROPION HCL ER (SR) 100 MG PO TB12
100.0000 mg | ORAL_TABLET | Freq: Two times a day (BID) | ORAL | Status: DC
  Administered 2015-01-02 – 2015-01-04 (×5): 100 mg via ORAL
  Filled 2015-01-02 (×7): qty 1

## 2015-01-02 MED ORDER — LEVOTHYROXINE SODIUM 100 MCG PO TABS
100.0000 ug | ORAL_TABLET | Freq: Every day | ORAL | Status: DC
  Administered 2015-01-03 – 2015-01-04 (×2): 100 ug via ORAL
  Filled 2015-01-02 (×4): qty 1

## 2015-01-02 MED ORDER — VITAMIN B-12 1000 MCG PO TABS
1000.0000 ug | ORAL_TABLET | Freq: Every day | ORAL | Status: DC
  Administered 2015-01-02 – 2015-01-03 (×2): 1000 ug via ORAL
  Filled 2015-01-02 (×5): qty 1

## 2015-01-02 MED ORDER — FOSFOMYCIN TROMETHAMINE 3 G PO PACK
3.0000 g | PACK | ORAL | Status: AC
  Administered 2015-01-02: 3 g via ORAL

## 2015-01-02 MED ORDER — VITAMIN D3 25 MCG (1000 UNIT) PO TABS
1000.0000 [IU] | ORAL_TABLET | Freq: Every day | ORAL | Status: DC
  Administered 2015-01-02 – 2015-01-04 (×3): 1000 [IU] via ORAL
  Filled 2015-01-02 (×4): qty 1

## 2015-01-02 MED ORDER — LAMOTRIGINE 100 MG PO TABS
100.0000 mg | ORAL_TABLET | Freq: Every day | ORAL | Status: DC
  Administered 2015-01-02 – 2015-01-04 (×3): 100 mg via ORAL
  Filled 2015-01-02 (×4): qty 1

## 2015-01-02 MED ORDER — ALPRAZOLAM 0.5 MG PO TABS
0.5000 mg | ORAL_TABLET | Freq: Every day | ORAL | Status: DC
  Administered 2015-01-02 – 2015-01-04 (×3): 0.5 mg via ORAL
  Filled 2015-01-02 (×3): qty 1

## 2015-01-02 MED ORDER — NON FORMULARY: 40.0000 mg | Freq: Every day | Status: DC

## 2015-01-02 MED ORDER — LATANOPROST 0.005 % OP SOLN
1.0000 [drp] | Freq: Every day | OPHTHALMIC | Status: DC
  Administered 2015-01-04: 1 [drp] via OPHTHALMIC
  Filled 2015-01-02 (×2): qty 2.5

## 2015-01-02 MED ORDER — FOSFOMYCIN TROMETHAMINE 3 G PO PACK
PACK | ORAL | Status: AC
  Administered 2015-01-02: 3 g via ORAL
  Filled 2015-01-02: qty 3

## 2015-01-02 MED ORDER — RISPERIDONE 0.5 MG PO TABS
0.2500 mg | ORAL_TABLET | Freq: Every day | ORAL | Status: DC
  Administered 2015-01-02 – 2015-01-03 (×2): 0.25 mg via ORAL
  Filled 2015-01-02 (×2): qty 1
  Filled 2015-01-02: qty 0.5
  Filled 2015-01-02 (×2): qty 1

## 2015-01-02 MED ORDER — PANTOPRAZOLE SODIUM 40 MG PO TBEC
40.0000 mg | DELAYED_RELEASE_TABLET | Freq: Every day | ORAL | Status: DC
  Administered 2015-01-02 – 2015-01-04 (×2): 40 mg via ORAL
  Filled 2015-01-02 (×2): qty 1

## 2015-01-02 NOTE — ED Notes (Signed)
BEHAVIORAL HEALTH ROUNDING Patient sleeping: No. Patient alert and oriented: yes Behavior appropriate: Yes.  ; If no, describe:  Nutrition and fluids offered: Yes  Toileting and hygiene offered: Yes  Sitter present: yes Law enforcement present: Yes  

## 2015-01-02 NOTE — ED Notes (Signed)
BEHAVIORAL HEALTH ROUNDING Patient sleeping: Yes.   Patient alert and oriented: not applicable Behavior appropriate: Yes.  ; If no, describe:  Nutrition and fluids offered: Yes  Toileting and hygiene offered: Yes  Sitter present: yes Law enforcement present: Yes  

## 2015-01-02 NOTE — ED Notes (Signed)

## 2015-01-02 NOTE — ED Notes (Addendum)
Yellow colored ring with yellow stone and green stone sent home with son, Belvia Gotschall. Pt dressed out by this RN and clothing (green nightgown and underwear) sent to [place].

## 2015-01-02 NOTE — ED Provider Notes (Addendum)
[facility] Emergency Department Provider Note  ____________________________________________  Time seen: Approximately 1:29 PM  I have reviewed the triage vital signs and the nursing notes.   HISTORY  Chief Complaint Suicidal    HPI Stephanie Anthony is a 81 y.o. female the history of significant psychiatric problems including bipolar disorder, renal insufficiency, diabetes insipidusas well as recent evaluation and a urinary tract infection. She presents today after her son states she's been very very depressed and making statements and techs did him that she is going to kill herself. Son found that she had cut her left wrist with a knife, prompting a call to 911 and also he placed her under involuntary commitment. The magistrate.  The patient states that she is very sad, tired, does not want to live. She denies hallucinations, but is suicidal. She denies being in pain. She reports being tired and fatigued for the last several months.  Son reports the patient has been hospitalized at [facility] for psychiatric care in the not too distant past.   Past Medical History  Diagnosis Date  . Bipolar 1 disorder   . Seasonal allergies   . Murmur, cardiac   . Coronary artery disease   . Chronic mitral valve disease   . Hypertension   . Chronic kidney disease     stage 3  . Deviated nasal septum   . Renal cyst, left     bilateral  . Nephrogenic diabetes insipidus     Patient Active Problem List   Diagnosis Date Noted  . Enteritis 10/17/2014  . Bipolar disorder, current episode depressed, severe, without psychotic features   . Bipolar affective disorder, current episode depressed 09/25/2014    Past Surgical History  Procedure Laterality Date  . Thyroidectomy    . Cholecystectomy    . Esophagogastroduodenoscopy (egd) with propofol N/A 10/20/2014    Procedure: ESOPHAGOGASTRODUODENOSCOPY (EGD) WITH PROPOFOL;  Surgeon: [name], MD;  Location: [facility]  ENDOSCOPY;  Service: Endoscopy;  Laterality: N/A;  . Cardiac catheterization    . Knee replacement, right      Current Outpatient Rx  Name  Route  Sig  Dispense  Refill  . ALPRAZolam (XANAX) 0.25 MG tablet   Oral   Take 1 tablet by mouth at bedtime as needed.         [name] buPROPion (WELLBUTRIN SR) 100 MG 12 hr tablet               . cephALEXin (KEFLEX) 500 MG capsule   Oral   Take 1 capsule (500 mg total) by mouth 3 (three) times daily.   21 capsule   0   . cholecalciferol (VITAMIN D) 1000 UNITS tablet   Oral   Take 1 tablet (1,000 Units total) by mouth daily.   30 tablet   0   . enalapril (VASOTEC) 2.5 MG tablet               . lamoTRIgine (LAMICTAL) 100 MG tablet   Oral   Take 1 tablet by mouth at bedtime.          [name] latanoprost (XALATAN) 0.005 % ophthalmic solution   Both Eyes   Place 1 drop into both eyes at bedtime.         [name] levothyroxine (SYNTHROID, LEVOTHROID) 100 MCG tablet   Oral   Take by mouth.         . lovastatin (MEVACOR) 40 MG tablet      Take 1 tablet (40 mg  total) by mouth nightly.      6   . pantoprazole (PROTONIX) 40 MG tablet   Oral   Take by mouth.         . risperiDONE (RISPERDAL) 0.25 MG tablet   Oral   Take by mouth.         . vitamin B-12 (CYANOCOBALAMIN) 1000 MCG tablet   Oral   Take 1,000 mcg by mouth daily.         [name] HYDROcodone-acetaminophen (NORCO/VICODIN) 5-325 MG per tablet   Oral   Take by mouth.           Allergies Aspirin; Codeine; Codeine sulfate; Prednisone; and Valium  Family History  Problem Relation Age of Onset  . Heart Problems Mother   . Seizures Mother   . Stroke Mother   . Diverticulitis Mother   . Heart attack Father   . Heart attack Sister   . Pancreatitis Sister   . Cancer Sister   . Cancer Brother     Social History Social History  Substance Use Topics  . Smoking status: Former Research scientist (life sciences)  . Smokeless tobacco: Never Used     Comment: quit 1964  . Alcohol Use: No     Review of Systems Constitutional: No fever/chills Eyes: No visual changes. ENT: No sore throat. Cardiovascular: Denies chest pain. Respiratory: Denies shortness of breath. Gastrointestinal: No abdominal pain.  No nausea, no vomiting.  No diarrhea.  No constipation. Genitourinary: Negative for dysuria. Patient had a recent urinary tract infection per her son, and was treated in the emergency room last week for this. Musculoskeletal: Negative for back pain. Skin: Negative for rash. Neurological: Negative for headaches, focal weakness or numbness. Patient just feels generally very fatigued.  10-point ROS otherwise negative. Up-to-date tetanus per family ____________________________________________   PHYSICAL EXAM:  VITAL SIGNS: ED Triage Vitals  Enc Vitals Group     BP 01/02/15 1149 129/75 mmHg     Pulse Rate 01/02/15 1149 83     Resp 01/02/15 1149 20     Temp 01/02/15 1149 98.7 F (37.1 C)     Temp Source 01/02/15 1149 Oral     SpO2 01/02/15 1149 97 %     Weight 01/02/15 1149 160 lb (72.576 kg)     Height 01/02/15 1149 5\' 7"  (1.702 m)     Head Cir --      Peak Flow --      Pain Score --      Pain Loc --      Pain Edu? --      Excl. in [place]? --     Constitutional: Alert and oriented. Chronically ill appearing, but  in no acute distress. Eyes: Conjunctivae are normal. PERRL. EOMI. Head: Atraumatic. Nose: No congestion/rhinnorhea. Mouth/Throat: Mucous membranes are moist.  Oropharynx non-erythematous. Neck: No stridor.   Cardiovascular: Normal rate, regular rhythm. Grossly normal heart sounds.  Good peripheral circulation. Respiratory: Normal respiratory effort.  No retractions. Lungs CTAB. Gastrointestinal: Soft and nontender. No distention. No abdominal bruits. No CVA tenderness. Musculoskeletal: No trauma except for 2 linear biliary superficial lacerations to the left volar wrist. Bandaged and cleaned. No lower extremity tenderness nor edema.  No joint  effusions. Neurologic:  Normal speech and language. No gross focal neurologic deficits are appreciated. No gait instability. Skin:  Skin is warm, dry and intact. No rash noted. Psychiatric: Mood and affect are normal. Speech and behavior are normal.  ____________________________________________   LABS (all labs ordered are listed, but only  abnormal results are displayed)  Labs Reviewed  ETHANOL - Abnormal; Notable for the following:    Alcohol, Ethyl (B) 7 (*)    All other components within normal limits  ACETAMINOPHEN LEVEL - Abnormal; Notable for the following:    Acetaminophen (Tylenol), Serum <10 (*)    All other components within normal limits  CBC - Abnormal; Notable for the following:    RBC 5.50 (*)    MCV 78.1 (*)    MCH 25.9 (*)    All other components within normal limits  URINALYSIS COMPLETEWITH MICROSCOPIC ([facility] ONLY) - Abnormal; Notable for the following:    Color, Urine YELLOW (*)    APPearance CLEAR (*)    Ketones, ur 2+ (*)    Hgb urine dipstick 1+ (*)    Squamous Epithelial / LPF 0-5 (*)    All other components within normal limits  SALICYLATE LEVEL  URINE DRUG SCREEN, QUALITATIVE ([facility] ONLY)  COMPREHENSIVE METABOLIC PANEL   ____________________________________________  EKG   ____________________________________________  RADIOLOGY   ____________________________________________   PROCEDURES  Procedure(s) performed: None  Critical Care performed: No  ____________________________________________   INITIAL IMPRESSION / ASSESSMENT AND PLAN / ED COURSE  Pertinent labs & imaging results that were available during my care of the patient were reviewed by me and considered in my medical decision making (see chart for details).  Patient presents with suicide attempt. Long history of established psychiatric disease. Also, diagnosed with urinary tract infection one week ago. Labs and screening exam demonstrated a slightly elevated creatinine, likely  due to some mild dehydration. I will hydrate her in the emergency room and will plan to check another creatinine tomorrow morning, otherwise seem enamel stable. Patient did cut her left wrist, but very superficial not requiring repair. Wound was washed and irrigated by nursing staff.  Because of the patient's recent urinary tract infection, I did give her a dose of fosfomycin. She appears to be medically stable, and appropriate for psychiatric referral after hydration. I will continue her on IVC which was petitioned by her son, quite appropriately.  Psychiatric consultation and TTS consultation ordered. Ongoing care and disposition assigned Dr. Marcelene Butte at this time. ____________________________________________   FINAL CLINICAL IMPRESSION(S) / ED DIAGNOSES  Final diagnoses:  Bipolar disorder, current episode depressed, severe, with psychotic features  Wrist laceration, left, initial encounter      Delman Kitten, MD 01/02/15 1528  Delman Kitten, MD 01/02/15 1535  Delman Kitten, MD 01/02/15 1535

## 2015-01-02 NOTE — ED Notes (Signed)
Pharmacy called about pt's due medications, will send.

## 2015-01-02 NOTE — ED Notes (Signed)
BEHAVIORAL HEALTH ROUNDING Patient sleeping: Yes.   Patient alert and oriented: not applicable Behavior appropriate: Yes.  ; If no, describe:  Nutrition and fluids offered: Yes  Toileting and hygiene offered: Yes  Sitter present: yes Law enforcement present: Yes  ENVIRONMENTAL ASSESSMENT Potentially harmful objects out of patient reach: Yes.   Personal belongings secured: Yes.   Patient dressed in hospital provided attire only: Yes.   Plastic bags out of patient reach: Yes.   Patient care equipment (cords, cables, call bells, lines, and drains) shortened, removed, or accounted for: Yes.   Equipment and supplies removed from bottom of stretcher: Yes.   Potentially toxic materials out of patient reach: Yes.   Sharps container removed or out of patient reach: Yes.

## 2015-01-02 NOTE — ED Notes (Signed)
Daughter, Darcel Bayley, [phone].

## 2015-01-02 NOTE — BH Assessment (Signed)
Assessment Note  Stephanie Anthony is an 81 y.o. female presenting to ED  with self-inflicted lacerations to left wrist. Pt presented as cooperative with flat affect. Pt exhibited poor eye contact throughout assessment.  Pt reports recent history of suicide attempt resulting in [facility] inpatient admission ("I was tryna hurt my wrist, that's why they sent me there"). Pt denies current SI and attempt "I just wanted to rest and have peace of mind". Pt reports no previous psychiatric dx and states "I have a chemical imbalance". Pt does endorse the following sxs of depression: Despondence, insomnia, loss if interest, self-pity. Pt identify numerous medical issues as current stressors, including glaucoma and right knee replacement.    Pt denies hallucinations, "It's a chemical imbalance, it controls my thoughts, It not like someone talking to me. I don't know how to explain it". Pt denies any current/history of HI, delusions, abuse. Pt reports poor appetite with recent weight loss however, is unable to quantify amount. Pt reports difficulty sleeping however, is unable to provide average hours of sleep poor night.   Pt identified her son Francille Wittmann [phone]) as family support. Son reported Pt previous dx of bipolar d/o. Son reported history of "attention seeking and manipulative behaviors". Son stated that he has been "dealing with this for 14 years" since Pt's husband (son's father) past away. Son stated that Pt's behaviors have worsened within the last few years. Son reported that Pt. left a message on his phone approx. 1 week ago "telling me goodbye". Son reports that he is unsure if Pt is a threat to her herself.   Pt referred for Psych MD consult.   Axis I: Bipolar, Depressed  Past Medical History:  Past Medical History  Diagnosis Date  . Bipolar 1 disorder   . Seasonal allergies   . Murmur, cardiac   . Coronary artery disease   . Chronic mitral valve disease   . Hypertension   . Chronic  kidney disease     stage 3  . Deviated nasal septum   . Renal cyst, left     bilateral  . Nephrogenic diabetes insipidus     Past Surgical History  Procedure Laterality Date  . Thyroidectomy    . Cholecystectomy    . Esophagogastroduodenoscopy (egd) with propofol N/A 10/20/2014    Procedure: ESOPHAGOGASTRODUODENOSCOPY (EGD) WITH PROPOFOL;  Surgeon: [name], MD;  Location: [facility] ENDOSCOPY;  Service: Endoscopy;  Laterality: N/A;  . Cardiac catheterization    . Knee replacement, right      Family History:  Family History  Problem Relation Age of Onset  . Heart Problems Mother   . Seizures Mother   . Stroke Mother   . Diverticulitis Mother   . Heart attack Father   . Heart attack Sister   . Pancreatitis Sister   . Cancer Sister   . Cancer Brother     Social History:  reports that she has quit smoking. She has never used smokeless tobacco. She reports that she does not drink alcohol or use illicit drugs.  Additional Social History:  Alcohol / Drug Use Pain Medications: None Reported Prescriptions: None Reported Over the Counter: None Reported History of alcohol / drug use?: No history of alcohol / drug abuse Longest period of sobriety (when/how long): N/A Negative Consequences of Use:  (N/A) Withdrawal Symptoms:  (N/A)  CIWA: CIWA-Ar BP: 129/75 mmHg Pulse Rate: 83 COWS:    Allergies:  Allergies  Allergen Reactions  . Aspirin Other (See Comments)  Reaction: Rectal bleeding  . Codeine Nausea And Vomiting  . Codeine Sulfate Nausea Only  . Prednisone Other (See Comments)    Reaction: Hallucinations  . Valium [Diazepam] Other (See Comments)    Reaction: Strange feelings    Home Medications:  (Not in a hospital admission)  OB/GYN Status:  No LMP recorded. Patient is postmenopausal.  General Assessment Data Location of Assessment: [facility] TTS Assessment: In system Is this a Tele or Face-to-Face Assessment?: Face-to-Face Is this an Initial Assessment  or a Re-assessment for this encounter?: Initial Assessment Marital status: Widowed [place] name: Alexander Mt Is patient pregnant?: No Pregnancy Status: No Living Arrangements: Other relatives (Sister and 2 nephews) Can pt return to current living arrangement?: Yes Admission Status: Voluntary Is patient capable of signing voluntary admission?: Yes Referral Source: Self/Family/Friend Insurance type: Medicare     Crisis Care Plan Living Arrangements: Other relatives (Sister and 2 nephews) Name of Psychiatrist: Pt reports Dr. Jimmye Norman, Pt's son reports Dr.Chen, Both report Psych is located in [place] but, unable to identify  practice name Name of Therapist: None  Education Status Is patient currently in school?: No Current Grade: N/A Highest grade of school patient has completed: 6th Name of school: N/A Contact person: Rainn Zupko (son)- [phone]  Risk to self with the past 6 months Suicidal Ideation: Yes-Currently Present Has patient been a risk to self within the past 6 months prior to admission? : Yes Suicidal Intent: Yes-Currently Present Has patient had any suicidal intent within the past 6 months prior to admission? : Yes Is patient at risk for suicide?: Yes Suicidal Plan?: Yes-Currently Present Has patient had any suicidal plan within the past 6 months prior to admission? : Yes Specify Current Suicidal Plan: Cut Wrists Access to Means: Yes Specify Access to Suicidal Means: Access to Knives What has been your use of drugs/alcohol within the last 12 months?: None Reported Previous Attempts/Gestures: Yes How many times?: 1 Other Self Harm Risks: None Noted Triggers for Past Attempts: Unknown Intentional Self Injurious Behavior: None Family Suicide History: Yes (Brother) Recent stressful life event(s): Other (Comment) (Medical Problems) Persecutory voices/beliefs?: No Depression: Yes Depression Symptoms: Isolating, Insomnia, Despondent, Loss of interest in usual  pleasures, Feeling worthless/self pity Substance abuse history and/or treatment for substance abuse?: No Suicide prevention information given to non-admitted patients: Not applicable  Risk to Others within the past 6 months Homicidal Ideation: No Does patient have any lifetime risk of violence toward others beyond the six months prior to admission? : No Thoughts of Harm to Others: No Current Homicidal Intent: No Current Homicidal Plan: No Access to Homicidal Means: No Identified Victim: N/A History of harm to others?: No Assessment of Violence: None Noted Violent Behavior Description: N/A Does patient have access to weapons?: Yes (Comment) (Access to [employer]) Criminal Charges Pending?: No Does patient have a court date: No Is patient on probation?: No  Psychosis Hallucinations: None noted Delusions: None noted  Mental Status Report Eye Contact: Poor Motor Activity: Unremarkable Speech: Soft Level of Consciousness: Alert Mood: Sullen Affect: Flat Anxiety Level: Moderate Thought Processes: Relevant, Coherent Judgement: Partial Orientation: Place, Time, Situation, Person Obsessive Compulsive Thoughts/Behaviors: None  Cognitive Functioning Concentration: Good Memory: Recent Intact, Remote Intact IQ: Average Insight: Fair Impulse Control: Unable to Assess Appetite: Poor Weight Loss:  (reports recent loss, unable to quantify amount) Weight Gain: 0 Sleep: Decreased Total Hours of Sleep:  (Pt. reports poor sleep, unable to provide avg. hrs of sleep) Vegetative Symptoms: None  ADLScreening [facility] Assessment Services) Patient's  cognitive ability adequate to safely complete daily activities?: Yes Patient able to express need for assistance with ADLs?: Yes Independently performs ADLs?: Yes (appropriate for developmental age)  Prior Inpatient Therapy Prior Inpatient Therapy: Yes Prior Therapy Dates: "12 days", "3 or 4 months ago", per son Prior Therapy Facilty/Provider(s):  [facility] Reason for Treatment: "I was tryna hurt my wrist, thats why they sent me there"  Prior Outpatient Therapy Prior Outpatient Therapy: No Prior Therapy Dates: N/A Prior Therapy Facilty/Provider(s): N/A Reason for Treatment: N/A Does patient have an ACCT team?: No Does patient have Intensive In-House Services?  : No Does patient have [facility] services? : No Does patient have P4CC services?: No  ADL Screening (condition at time of admission) Patient's cognitive ability adequate to safely complete daily activities?: Yes Is the patient deaf or have difficulty hearing?: No Does the patient have difficulty seeing, even when wearing glasses/contacts?: Yes (Glaucoma) Does the patient have difficulty concentrating, remembering, or making decisions?: No Patient able to express need for assistance with ADLs?: Yes Does the patient have difficulty dressing or bathing?: No Independently performs ADLs?: Yes (appropriate for developmental age) Does the patient have difficulty walking or climbing stairs?: Yes (Right knee replacement) Weakness of Legs: Left (Left Knee) Weakness of Arms/Hands: None  Home Assistive Devices/Equipment Home Assistive Devices/Equipment: Raised toilet seat with rails  Therapy Consults (therapy consults require a physician order) PT Evaluation Needed: No OT Evalulation Needed: No SLP Evaluation Needed: No Abuse/Neglect Assessment (Assessment to be complete while patient is alone) Physical Abuse: Denies Verbal Abuse: Denies Sexual Abuse: Denies Exploitation of patient/patient's resources: Denies Self-Neglect: Denies Values / Beliefs Cultural Requests During Hospitalization: None Spiritual Requests During Hospitalization: None Consults Spiritual Care Consult Needed: No Social Work Consult Needed: No Regulatory affairs officer (For Healthcare) Does patient have an advance directive?: No Would patient like information on creating an advanced directive?: No - patient  declined information    Additional Information CIRT Risk: No Elopement Risk: No     Disposition:  Disposition Initial Assessment Completed for this Encounter: Yes Disposition of Patient: Referred to (Psych MD Consult)  On Site Evaluation by:   Reviewed with Physician:    Oley Lahaie J Martinique 01/02/2015 3:42 PM

## 2015-01-02 NOTE — ED Notes (Signed)
Med rec filed by this RN, sent medications home with son, Malani Lees.

## 2015-01-02 NOTE — BHH Counselor (Signed)
Geri-Psych referral sent to [facility].

## 2015-01-02 NOTE — ED Notes (Signed)
Pt arrives with laceration to left wrist, pt has hx of bipolar and cut her wrist with kitchen knife, pt states she has been off her medicine because of the chemical embalance, pt states she is trying to get away from everything, pt very flat upon arrival, left wrist wrapped in gauze

## 2015-01-02 NOTE — ED Notes (Signed)
Pt ambulated to toilet in room.   BEHAVIORAL HEALTH ROUNDING Patient sleeping: No. Patient alert and oriented: yes Behavior appropriate: Yes.  ; If no, describe:  Nutrition and fluids offered: Yes  Toileting and hygiene offered: Yes  Sitter present: yes Law enforcement present: Yes

## 2015-01-02 NOTE — Consult Note (Signed)
[facility] Consult   Reason for Consult:  Follow up Referring Physician:  ER Patient Identification: Stephanie Anthony MRN:  [id number] Principal Diagnosis: <principal problem not specified> Diagnosis:   Patient Active Problem List   Diagnosis Date Noted  . Enteritis [K52.9] 10/17/2014  . Bipolar disorder, current episode depressed, severe, without psychotic features [F31.4]   . Bipolar affective disorder, current episode depressed [F31.30] 09/25/2014    Total Time spent with patient: 45 minutes  Subjective:   Stephanie Anthony is a 81 y.o. female patient admitted with a long h/O depression.. Pt lives in her house and her sister and 2 nephews live with her. Pt is not able to do things for herself and has been getting increasingly depressed to the extent that she lacerated her left arm and sister called ambulance for help.  HPI:  Feeling depressed and feeling h/h and is being followed on Out pt basis by [name]. HPI Elements:     Past Medical History:  Past Medical History  Diagnosis Date  . Bipolar 1 disorder   . Seasonal allergies   . Murmur, cardiac   . Coronary artery disease   . Chronic mitral valve disease   . Hypertension   . Chronic kidney disease     stage 3  . Deviated nasal septum   . Renal cyst, left     bilateral  . Nephrogenic diabetes insipidus     Past Surgical History  Procedure Laterality Date  . Thyroidectomy    . Cholecystectomy    . Esophagogastroduodenoscopy (egd) with propofol N/A 10/20/2014    Procedure: ESOPHAGOGASTRODUODENOSCOPY (EGD) WITH PROPOFOL;  Surgeon: [name], MD;  Location: [facility] ENDOSCOPY;  Service: Endoscopy;  Laterality: N/A;  . Cardiac catheterization    . Knee replacement, right     Family History:  Family History  Problem Relation Age of Onset  . Heart Problems Mother   . Seizures Mother   . Stroke Mother   . Diverticulitis Mother   . Heart attack Father   . Heart attack Sister   . Pancreatitis Sister   .  Cancer Sister   . Cancer Brother    Social History:  History  Alcohol Use No     History  Drug Use No    Social History   Social History  . Marital Status: Widowed    Spouse Name: N/A  . Number of Children: N/A  . Years of Education: N/A   Social History Main Topics  . Smoking status: Former Research scientist (life sciences)  . Smokeless tobacco: Never Used     Comment: quit 1964  . Alcohol Use: No  . Drug Use: No  . Sexual Activity: No   Other Topics Concern  . None   Social History Narrative   Additional Social History:    Pain Medications: None Reported Prescriptions: None Reported Over the Counter: None Reported History of alcohol / drug use?: No history of alcohol / drug abuse Longest period of sobriety (when/how long): N/A Negative Consequences of Use:  (N/A) Withdrawal Symptoms:  (N/A)                     Allergies:   Allergies  Allergen Reactions  . Aspirin Other (See Comments)    Reaction: Rectal bleeding  . Codeine Nausea And Vomiting  . Codeine Sulfate Nausea Only  . Prednisone Other (See Comments)    Reaction: Hallucinations  . Valium [Diazepam] Other (See Comments)    Reaction: Strange feelings  Labs:  Results for orders placed or performed during the hospital encounter of 01/02/15 (from the past 48 hour(s))  Comprehensive metabolic panel     Status: Abnormal   Collection Time: 01/02/15 11:56 AM  Result Value Ref Range   Sodium 139 135 - 145 mmol/L   Potassium 4.1 3.5 - 5.1 mmol/L   Chloride 105 101 - 111 mmol/L   CO2 19 (L) 22 - 32 mmol/L   Glucose, Bld 78 65 - 99 mg/dL   BUN 29 (H) 6 - 20 mg/dL   Creatinine, Ser 1.87 (H) 0.44 - 1.00 mg/dL   Calcium 9.6 8.9 - 10.3 mg/dL   Total Protein 7.4 6.5 - 8.1 g/dL   Albumin 4.4 3.5 - 5.0 g/dL   AST 26 15 - 41 U/L   ALT 13 (L) 14 - 54 U/L   Alkaline Phosphatase 49 38 - 126 U/L   Total Bilirubin 1.2 0.3 - 1.2 mg/dL   GFR calc non Af Amer 25 (L) >60 mL/min   GFR calc Af Amer 29 (L) >60 mL/min   Anion gap 15  5 - 15  Ethanol (ETOH)     Status: Abnormal   Collection Time: 01/02/15 11:56 AM  Result Value Ref Range   Alcohol, Ethyl (B) 7 (H) <5 mg/dL    Comment:        LOWEST DETECTABLE LIMIT FOR SERUM ALCOHOL IS 5 mg/dL FOR MEDICAL PURPOSES ONLY   Salicylate level     Status: None   Collection Time: 01/02/15 11:56 AM  Result Value Ref Range   Salicylate Lvl <0.2 2.8 - 30.0 mg/dL  Acetaminophen level     Status: Abnormal   Collection Time: 01/02/15 11:56 AM  Result Value Ref Range   Acetaminophen (Tylenol), Serum <10 (L) 10 - 30 ug/mL    Comment:        THERAPEUTIC CONCENTRATIONS VARY SIGNIFICANTLY. A RANGE OF 10-30 ug/mL MAY BE AN EFFECTIVE CONCENTRATION FOR MANY PATIENTS. HOWEVER, SOME ARE BEST TREATED AT CONCENTRATIONS OUTSIDE THIS RANGE. ACETAMINOPHEN CONCENTRATIONS >150 ug/mL AT 4 HOURS AFTER INGESTION AND >50 ug/mL AT 12 HOURS AFTER INGESTION ARE OFTEN ASSOCIATED WITH TOXIC REACTIONS.   CBC     Status: Abnormal   Collection Time: 01/02/15 11:56 AM  Result Value Ref Range   WBC 9.2 3.6 - 11.0 K/uL   RBC 5.50 (H) 3.80 - 5.20 MIL/uL   Hemoglobin 14.2 12.0 - 16.0 g/dL   HCT 43.0 35.0 - 47.0 %   MCV 78.1 (L) 80.0 - 100.0 fL   MCH 25.9 (L) 26.0 - 34.0 pg   MCHC 33.1 32.0 - 36.0 g/dL   RDW 13.6 11.5 - 14.5 %   Platelets 174 150 - 440 K/uL  Urine Drug Screen, Qualitative ([facility] only)     Status: None   Collection Time: 01/02/15  1:39 PM  Result Value Ref Range   Tricyclic, Ur Screen NONE DETECTED NONE DETECTED   Amphetamines, Ur Screen NONE DETECTED NONE DETECTED   MDMA (Ecstasy)Ur Screen NONE DETECTED NONE DETECTED   Cocaine Metabolite,Ur [place] NONE DETECTED NONE DETECTED   Opiate, Ur Screen NONE DETECTED NONE DETECTED   Phencyclidine (PCP) Ur S NONE DETECTED NONE DETECTED   Cannabinoid 50 Ng, Ur [place] NONE DETECTED NONE DETECTED   Barbiturates, Ur Screen NONE DETECTED NONE DETECTED   Benzodiazepine, Ur Scrn NONE DETECTED NONE DETECTED   Methadone Scn, Ur NONE DETECTED NONE  DETECTED    Comment: (NOTE) 542  Tricyclics, urine  Cutoff 1000 ng/mL 200  Amphetamines, urine             Cutoff 1000 ng/mL 300  MDMA (Ecstasy), urine           Cutoff 500 ng/mL 400  Cocaine Metabolite, urine       Cutoff 300 ng/mL 500  Opiate, urine                   Cutoff 300 ng/mL 600  Phencyclidine (PCP), urine      Cutoff 25 ng/mL 700  Cannabinoid, urine              Cutoff 50 ng/mL 800  Barbiturates, urine             Cutoff 200 ng/mL 900  Benzodiazepine, urine           Cutoff 200 ng/mL 1000 Methadone, urine                Cutoff 300 ng/mL 1100 1200 The urine drug screen provides only a preliminary, unconfirmed 1300 analytical test result and should not be used for non-medical 1400 purposes. Clinical consideration and professional judgment should 1500 be applied to any positive drug screen result due to possible 1600 interfering substances. A more specific alternate chemical method 1700 must be used in order to obtain a confirmed analytical result.  1800 Gas chromato graphy / mass spectrometry (GC/MS) is the preferred 1900 confirmatory method.   Urinalysis complete, with microscopic ([facility] only)     Status: Abnormal   Collection Time: 01/02/15  1:39 PM  Result Value Ref Range   Color, Urine YELLOW (A) YELLOW   APPearance CLEAR (A) CLEAR   Glucose, UA NEGATIVE NEGATIVE mg/dL   Bilirubin Urine NEGATIVE NEGATIVE   Ketones, ur 2+ (A) NEGATIVE mg/dL   Specific Gravity, Urine 1.011 1.005 - 1.030   Hgb urine dipstick 1+ (A) NEGATIVE   pH 5.0 5.0 - 8.0   Protein, ur NEGATIVE NEGATIVE mg/dL   Nitrite NEGATIVE NEGATIVE   Leukocytes, UA NEGATIVE NEGATIVE   RBC / HPF 0-5 0 - 5 RBC/hpf   WBC, UA 0-5 0 - 5 WBC/hpf   Bacteria, UA NONE SEEN NONE SEEN   Squamous Epithelial / LPF 0-5 (A) NONE SEEN   Mucous PRESENT    Hyaline Casts, UA PRESENT     Vitals: Blood pressure 129/75, pulse 83, temperature 98.7 F (37.1 C), temperature source Oral, resp. rate 20, height 5\' 7"   (1.702 m), weight 72.576 kg (160 lb), SpO2 97 %.  Risk to Self: Suicidal Ideation: Yes-Currently Present Suicidal Intent: Yes-Currently Present Is patient at risk for suicide?: Yes Suicidal Plan?: Yes-Currently Present Specify Current Suicidal Plan: Cut Wrists Access to Means: Yes Specify Access to Suicidal Means: Access to Knives What has been your use of drugs/alcohol within the last 12 months?: None Reported How many times?: 1 Other Self Harm Risks: None Noted Triggers for Past Attempts: Unknown Intentional Self Injurious Behavior: None Risk to Others: Homicidal Ideation: No Thoughts of Harm to Others: No Current Homicidal Intent: No Current Homicidal Plan: No Access to Homicidal Means: No Identified Victim: N/A History of harm to others?: No Assessment of Violence: None Noted Violent Behavior Description: N/A Does patient have access to weapons?: Yes (Comment) (Access to [employer]) Criminal Charges Pending?: No Does patient have a court date: No Prior Inpatient Therapy: Prior Inpatient Therapy: Yes Prior Therapy Dates: "12 days", "3 or 4 months ago", per son Prior Therapy Facilty/Provider(s): [facility] Reason for Treatment: "I  was tryna hurt my wrist, thats why they sent me there" Prior Outpatient Therapy: Prior Outpatient Therapy: No Prior Therapy Dates: N/A Prior Therapy Facilty/Provider(s): N/A Reason for Treatment: N/A Does patient have an ACCT team?: No Does patient have Intensive In-House Services?  : No Does patient have [facility] services? : No Does patient have [facility] services?: No  No current facility-administered medications for this encounter.   Current Outpatient Prescriptions  Medication Sig Dispense Refill  . ALPRAZolam (XANAX) 0.25 MG tablet Take 1 tablet by mouth at bedtime as needed.    [name] buPROPion (WELLBUTRIN SR) 100 MG 12 hr tablet     . cephALEXin (KEFLEX) 500 MG capsule Take 1 capsule (500 mg total) by mouth 3 (three) times daily. 21 capsule 0  .  cholecalciferol (VITAMIN D) 1000 UNITS tablet Take 1 tablet (1,000 Units total) by mouth daily. 30 tablet 0  . enalapril (VASOTEC) 2.5 MG tablet     . lamoTRIgine (LAMICTAL) 100 MG tablet Take 1 tablet by mouth at bedtime.     [name] latanoprost (XALATAN) 0.005 % ophthalmic solution Place 1 drop into both eyes at bedtime.    [name] levothyroxine (SYNTHROID, LEVOTHROID) 100 MCG tablet Take by mouth.    . lovastatin (MEVACOR) 40 MG tablet Take 1 tablet (40 mg total) by mouth nightly.  6  . pantoprazole (PROTONIX) 40 MG tablet Take by mouth.    . risperiDONE (RISPERDAL) 0.25 MG tablet Take by mouth.    . vitamin B-12 (CYANOCOBALAMIN) 1000 MCG tablet Take 1,000 mcg by mouth daily.    [name] HYDROcodone-acetaminophen (NORCO/VICODIN) 5-325 MG per tablet Take by mouth.      Musculoskeletal: Strength & Muscle Tone: within normal limits Gait & Station: normal Patient leans: N/A  Psychiatric Specialty Exam: Physical Exam  Nursing note and vitals reviewed.   ROS  Blood pressure 129/75, pulse 83, temperature 98.7 F (37.1 C), temperature source Oral, resp. rate 20, height 5\' 7"  (1.702 m), weight 72.576 kg (160 lb), SpO2 97 %.Body mass index is 25.05 kg/(m^2).  General Appearance: Casual  Eye Contact::  Fair  Speech:  Clear and Coherent  Volume:  Normal  Mood:  Anxious, Depressed, Dysphoric, Hopeless, Irritable, Worthless and low and down  Affect:  Constricted, Depressed, Flat and Tearful  Thought Process:  Circumstantial  Orientation:  Full (Time, Place, and Person)  Thought Content:  Rumination  Suicidal Thoughts:  No  Homicidal Thoughts:  No  Memory:  Immediate;   Fair Recent;   Fair Remote;   Fair adequate  Judgement:  Fair  Insight:  Fair  Psychomotor Activity:  Normal  Concentration:  Fair  Recall:  [employer] [place]  Language: Fair  Akathisia:  No  Handed:  Right  AIMS (if indicated):     Assets:  Communication Skills Desire for Improvement Housing Social  Support Transportation  ADL's:  Intact  Cognition: WNL  Sleep:      Medical Decision Making: Review of Psycho-Social Stressors (1)  Treatment Plan Summary: Plan ADmit Inpt to Psychiatry for help when bed is available and pt is medically cleared.  Plan:  Recommend psychiatric Inpatient admission when medically cleared. Disposition: as above  Leslyn Monda K 01/02/2015 5:20 PM

## 2015-01-02 NOTE — ED Notes (Signed)
Pt on phone with Daughter.

## 2015-01-02 NOTE — ED Notes (Addendum)
BEHAVIORAL HEALTH ROUNDING Patient sleeping: Yes.   Patient alert and oriented: not applicable Behavior appropriate: Yes.  ; If no, describe:  Nutrition and fluids offered: Yes  Toileting and hygiene offered: Yes  Sitter present: yes Law enforcement present: Yes  

## 2015-01-03 DIAGNOSIS — F314 Bipolar disorder, current episode depressed, severe, without psychotic features: Secondary | ICD-10-CM | POA: Diagnosis not present

## 2015-01-03 DIAGNOSIS — F315 Bipolar disorder, current episode depressed, severe, with psychotic features: Secondary | ICD-10-CM | POA: Diagnosis not present

## 2015-01-03 NOTE — ED Notes (Signed)
ED BHU Chambers Is the patient under IVC or is there intent for IVC: Yes.   Is the patient medically cleared: No. Is there vacancy in the ED BHU: No. Is the population mix appropriate for patient: Yes.   Is the patient awaiting placement in inpatient or outpatient setting: No. Has the patient had a psychiatric consult: No. Survey of unit performed for contraband, proper placement and condition of furniture, tampering with fixtures in bathroom, shower, and each patient room: Yes.  ; Findings:  APPEARANCE/BEHAVIOR calm, cooperative and adequate rapport can be established NEURO ASSESSMENT Orientation: place and person Hallucinations: No.None noted (Hallucinations) Speech: Normal Gait: normal RESPIRATORY ASSESSMENT Normal expansion.  Clear to auscultation.  No rales, rhonchi, or wheezing. CARDIOVASCULAR ASSESSMENT regular rate and rhythm, S1, S2 normal, no murmur, click, rub or gallop GASTROINTESTINAL ASSESSMENT soft, nontender, BS WNL, no r/g EXTREMITIES normal strength, tone, and muscle mass PLAN OF CARE Provide calm/safe environment. Vital signs assessed twice daily. ED BHU Assessment once each 12-hour shift. Collaborate with intake RN daily or as condition indicates. Assure the ED provider has rounded once each shift. Provide and encourage hygiene. Provide redirection as needed. Assess for escalating behavior; address immediately and inform ED provider.  Assess family dynamic and appropriateness for visitation as needed: Yes.  ; If necessary, describe findings:  Educate the patient/family about BHU procedures/visitation: Yes.  ; If necessary, describe findings:

## 2015-01-03 NOTE — ED Notes (Signed)

## 2015-01-03 NOTE — ED Notes (Signed)
Patient assigned to appropriate care area. Patient oriented to unit/care area: Informed that, for their safety, care areas are designed for safety and monitored by security cameras at all times; and visiting hours explained to patient. Patient verbalizes understanding, and verbal contract for safety obtained. 

## 2015-01-03 NOTE — Consult Note (Signed)
[facility] Face-to-Face Psychiatry Consult   Reason for Consult:  Consult for this 81 year old woman with a history of bipolar disorder in the hospital after a suicide attempt Referring Physician:  Archie Balboa Patient Identification: Stephanie Anthony MRN:  [id number] Principal Diagnosis: Bipolar disorder, current episode depressed, severe, without psychotic features Diagnosis:   Patient Active Problem List   Diagnosis Date Noted  . Enteritis [K52.9] 10/17/2014  . Bipolar disorder, current episode depressed, severe, without psychotic features [F31.4]   . Bipolar affective disorder, current episode depressed [F31.30] 09/25/2014    Total Time spent with patient: 1 hour  Subjective:   Stephanie Anthony is a 81 y.o. female patient admitted with "I have a chemical imbalance".  HPI:  Information from the patient and the chart. Patient reports that she has recently been going downhill and feeling much more depressed for at least the last week. Sleep is been poor. Appetite poor. Feeling nervous and worried all the time. Has been obsessed with the idea that she will no longer be able to drive or to care for herself. She says she is that she does not have hallucinations but has this strange foreboding feeling like something is controlling her. She had not been recently compliant with her medicines. She had stopped her cut back on all of them for no really good reason. Patient cut her left wrist fairly badly it's a rather ugly cut. Says that she's been having suicidal thoughts. Sister noticed that she was getting worse and assisted in her coming into the hospital.  Past psychiatric history: Long history of bipolar disorder. Multiple prior hospitalizations. Positive prior suicide attempts. Has tried several medicines in the past but recently has been stable on a combination of Lamictal and Risperdal bupropion and alprazolam.  Medical history: Patient has a history of high blood pressure gastric reflux symptoms elevated  cholesterol. More recently had had a infection of some sort in her esophagus but says that she has completely recovered from that.  Social history: Lives with her sister and 2 nephews. Sounds like they get along together okay.  Substance abuse history: Denies any history of current or past alcohol or drug problems HPI Elements:   Quality:  Depressed mood with suicidal behavior. Severity:  Severe life-threatening. Timing:  Worse over the last week or 2. Duration:  Ongoing. Context:  Medicine noncompliance.  Past Medical History:  Past Medical History  Diagnosis Date  . Bipolar 1 disorder   . Seasonal allergies   . Murmur, cardiac   . Coronary artery disease   . Chronic mitral valve disease   . Hypertension   . Chronic kidney disease     stage 3  . Deviated nasal septum   . Renal cyst, left     bilateral  . Nephrogenic diabetes insipidus     Past Surgical History  Procedure Laterality Date  . Thyroidectomy    . Cholecystectomy    . Esophagogastroduodenoscopy (egd) with propofol N/A 10/20/2014    Procedure: ESOPHAGOGASTRODUODENOSCOPY (EGD) WITH PROPOFOL;  Surgeon: [name], MD;  Location: [facility] ENDOSCOPY;  Service: Endoscopy;  Laterality: N/A;  . Cardiac catheterization    . Knee replacement, right     Family History:  Family History  Problem Relation Age of Onset  . Heart Problems Mother   . Seizures Mother   . Stroke Mother   . Diverticulitis Mother   . Heart attack Father   . Heart attack Sister   . Pancreatitis Sister   . Cancer Sister   .  Cancer Brother    Social History:  History  Alcohol Use No     History  Drug Use No    Social History   Social History  . Marital Status: Widowed    Spouse Name: N/A  . Number of Children: N/A  . Years of Education: N/A   Social History Main Topics  . Smoking status: Former Research scientist (life sciences)  . Smokeless tobacco: Never Used     Comment: quit 1964  . Alcohol Use: No  . Drug Use: No  . Sexual Activity: No   Other  Topics Concern  . None   Social History Narrative   Additional Social History:    Pain Medications: None Reported Prescriptions: None Reported Over the Counter: None Reported History of alcohol / drug use?: No history of alcohol / drug abuse Longest period of sobriety (when/how long): N/A Negative Consequences of Use:  (N/A) Withdrawal Symptoms:  (N/A)                     Allergies:   Allergies  Allergen Reactions  . Aspirin Other (See Comments)    Reaction: Rectal bleeding  . Codeine Nausea And Vomiting  . Codeine Sulfate Nausea Only  . Prednisone Other (See Comments)    Reaction: Hallucinations  . Valium [Diazepam] Other (See Comments)    Reaction: Strange feelings    Labs:  Results for orders placed or performed during the hospital encounter of 01/02/15 (from the past 48 hour(s))  Comprehensive metabolic panel     Status: Abnormal   Collection Time: 01/02/15 11:56 AM  Result Value Ref Range   Sodium 139 135 - 145 mmol/L   Potassium 4.1 3.5 - 5.1 mmol/L   Chloride 105 101 - 111 mmol/L   CO2 19 (L) 22 - 32 mmol/L   Glucose, Bld 78 65 - 99 mg/dL   BUN 29 (H) 6 - 20 mg/dL   Creatinine, Ser 1.87 (H) 0.44 - 1.00 mg/dL   Calcium 9.6 8.9 - 10.3 mg/dL   Total Protein 7.4 6.5 - 8.1 g/dL   Albumin 4.4 3.5 - 5.0 g/dL   AST 26 15 - 41 U/L   ALT 13 (L) 14 - 54 U/L   Alkaline Phosphatase 49 38 - 126 U/L   Total Bilirubin 1.2 0.3 - 1.2 mg/dL   GFR calc non Af Amer 25 (L) >60 mL/min   GFR calc Af Amer 29 (L) >60 mL/min   Anion gap 15 5 - 15  Ethanol (ETOH)     Status: Abnormal   Collection Time: 01/02/15 11:56 AM  Result Value Ref Range   Alcohol, Ethyl (B) 7 (H) <5 mg/dL    Comment:        LOWEST DETECTABLE LIMIT FOR SERUM ALCOHOL IS 5 mg/dL FOR MEDICAL PURPOSES ONLY   Salicylate level     Status: None   Collection Time: 01/02/15 11:56 AM  Result Value Ref Range   Salicylate Lvl <8.1 2.8 - 30.0 mg/dL  Acetaminophen level     Status: Abnormal   Collection  Time: 01/02/15 11:56 AM  Result Value Ref Range   Acetaminophen (Tylenol), Serum <10 (L) 10 - 30 ug/mL    Comment:        THERAPEUTIC CONCENTRATIONS VARY SIGNIFICANTLY. A RANGE OF 10-30 ug/mL MAY BE AN EFFECTIVE CONCENTRATION FOR MANY PATIENTS. HOWEVER, SOME ARE BEST TREATED AT CONCENTRATIONS OUTSIDE THIS RANGE. ACETAMINOPHEN CONCENTRATIONS >150 ug/mL AT 4 HOURS AFTER INGESTION AND >50 ug/mL AT 12 HOURS AFTER INGESTION  ARE OFTEN ASSOCIATED WITH TOXIC REACTIONS.   CBC     Status: Abnormal   Collection Time: 01/02/15 11:56 AM  Result Value Ref Range   WBC 9.2 3.6 - 11.0 K/uL   RBC 5.50 (H) 3.80 - 5.20 MIL/uL   Hemoglobin 14.2 12.0 - 16.0 g/dL   HCT 43.0 35.0 - 47.0 %   MCV 78.1 (L) 80.0 - 100.0 fL   MCH 25.9 (L) 26.0 - 34.0 pg   MCHC 33.1 32.0 - 36.0 g/dL   RDW 13.6 11.5 - 14.5 %   Platelets 174 150 - 440 K/uL  Urine Drug Screen, Qualitative ([facility] only)     Status: None   Collection Time: 01/02/15  1:39 PM  Result Value Ref Range   Tricyclic, Ur Screen NONE DETECTED NONE DETECTED   Amphetamines, Ur Screen NONE DETECTED NONE DETECTED   MDMA (Ecstasy)Ur Screen NONE DETECTED NONE DETECTED   Cocaine Metabolite,Ur [place] NONE DETECTED NONE DETECTED   Opiate, Ur Screen NONE DETECTED NONE DETECTED   Phencyclidine (PCP) Ur S NONE DETECTED NONE DETECTED   Cannabinoid 50 Ng, Ur [place] NONE DETECTED NONE DETECTED   Barbiturates, Ur Screen NONE DETECTED NONE DETECTED   Benzodiazepine, Ur Scrn NONE DETECTED NONE DETECTED   Methadone Scn, Ur NONE DETECTED NONE DETECTED    Comment: (NOTE) 161  Tricyclics, urine               Cutoff 1000 ng/mL 200  Amphetamines, urine             Cutoff 1000 ng/mL 300  MDMA (Ecstasy), urine           Cutoff 500 ng/mL 400  Cocaine Metabolite, urine       Cutoff 300 ng/mL 500  Opiate, urine                   Cutoff 300 ng/mL 600  Phencyclidine (PCP), urine      Cutoff 25 ng/mL 700  Cannabinoid, urine              Cutoff 50 ng/mL 800  Barbiturates, urine              Cutoff 200 ng/mL 900  Benzodiazepine, urine           Cutoff 200 ng/mL 1000 Methadone, urine                Cutoff 300 ng/mL 1100 1200 The urine drug screen provides only a preliminary, unconfirmed 1300 analytical test result and should not be used for non-medical 1400 purposes. Clinical consideration and professional judgment should 1500 be applied to any positive drug screen result due to possible 1600 interfering substances. A more specific alternate chemical method 1700 must be used in order to obtain a confirmed analytical result.  1800 Gas chromato graphy / mass spectrometry (GC/MS) is the preferred 1900 confirmatory method.   Urinalysis complete, with microscopic ([facility] only)     Status: Abnormal   Collection Time: 01/02/15  1:39 PM  Result Value Ref Range   Color, Urine YELLOW (A) YELLOW   APPearance CLEAR (A) CLEAR   Glucose, UA NEGATIVE NEGATIVE mg/dL   Bilirubin Urine NEGATIVE NEGATIVE   Ketones, ur 2+ (A) NEGATIVE mg/dL   Specific Gravity, Urine 1.011 1.005 - 1.030   Hgb urine dipstick 1+ (A) NEGATIVE   pH 5.0 5.0 - 8.0   Protein, ur NEGATIVE NEGATIVE mg/dL   Nitrite NEGATIVE NEGATIVE   Leukocytes, UA NEGATIVE NEGATIVE   RBC / HPF  0-5 0 - 5 RBC/hpf   WBC, UA 0-5 0 - 5 WBC/hpf   Bacteria, UA NONE SEEN NONE SEEN   Squamous Epithelial / LPF 0-5 (A) NONE SEEN   Mucous PRESENT    Hyaline Casts, UA PRESENT     Vitals: Blood pressure 118/61, pulse 62, temperature 98 F (36.7 C), temperature source Oral, resp. rate 16, height 5\' 7"  (1.702 m), weight 72.576 kg (160 lb), SpO2 99 %.  Risk to Self: Suicidal Ideation: Yes-Currently Present Suicidal Intent: Yes-Currently Present Is patient at risk for suicide?: Yes Suicidal Plan?: Yes-Currently Present Specify Current Suicidal Plan: Cut Wrists Access to Means: Yes Specify Access to Suicidal Means: Access to Knives What has been your use of drugs/alcohol within the last 12 months?: None Reported How many times?:  1 Other Self Harm Risks: None Noted Triggers for Past Attempts: Unknown Intentional Self Injurious Behavior: None Risk to Others: Homicidal Ideation: No Thoughts of Harm to Others: No Current Homicidal Intent: No Current Homicidal Plan: No Access to Homicidal Means: No Identified Victim: N/A History of harm to others?: No Assessment of Violence: None Noted Violent Behavior Description: N/A Does patient have access to weapons?: Yes (Comment) (Access to [employer]) Criminal Charges Pending?: No Does patient have a court date: No Prior Inpatient Therapy: Prior Inpatient Therapy: Yes Prior Therapy Dates: "12 days", "3 or 4 months ago", per son Prior Therapy Facilty/Provider(s): [facility] Reason for Treatment: "I was tryna hurt my wrist, thats why they sent me there" Prior Outpatient Therapy: Prior Outpatient Therapy: No Prior Therapy Dates: N/A Prior Therapy Facilty/Provider(s): N/A Reason for Treatment: N/A Does patient have an ACCT team?: No Does patient have Intensive In-House Services?  : No Does patient have [facility] services? : No Does patient have P4CC services?: No  Current Facility-Administered Medications  Medication Dose Route Frequency Provider Last Rate Last Dose  . ALPRAZolam Duanne Moron) tablet 0.5 mg  0.5 mg Oral QHS Daymon Larsen, MD   0.5 mg at 01/02/15 2136  . buPROPion (WELLBUTRIN SR) 12 hr tablet 100 mg  100 mg Oral BID Daymon Larsen, MD   100 mg at 01/03/15 1011  . cholecalciferol (VITAMIN D) tablet 1,000 Units  1,000 Units Oral Daily Daymon Larsen, MD   1,000 Units at 01/03/15 1012  . enalapril (VASOTEC) tablet 2.5 mg  2.5 mg Oral Daily Daymon Larsen, MD   2.5 mg at 01/03/15 1012  . lamoTRIgine (LAMICTAL) tablet 100 mg  100 mg Oral QHS Daymon Larsen, MD   100 mg at 01/02/15 2354  . latanoprost (XALATAN) 0.005 % ophthalmic solution 1 drop  1 drop Both Eyes QHS Daymon Larsen, MD      . levothyroxine (SYNTHROID, LEVOTHROID) tablet 100 mcg  100 mcg Oral QAC  breakfast Daymon Larsen, MD   100 mcg at 01/03/15 0724  . pantoprazole (PROTONIX) EC tablet 40 mg  40 mg Oral Daily Daymon Larsen, MD   40 mg at 01/02/15 2136  . pravastatin (PRAVACHOL) tablet 40 mg  40 mg Oral q1800 Daymon Larsen, MD   40 mg at 01/03/15 1807  . risperiDONE (RISPERDAL) tablet 0.25 mg  0.25 mg Oral Daily Daymon Larsen, MD   0.25 mg at 01/03/15 1014  . vitamin B-12 (CYANOCOBALAMIN) tablet 1,000 mcg  1,000 mcg Oral Daily Daymon Larsen, MD   1,000 mcg at 01/03/15 1014   Current Outpatient Prescriptions  Medication Sig Dispense Refill  . ALPRAZolam (XANAX) 0.25 MG tablet Take 1  tablet by mouth at bedtime as needed.    [name] buPROPion (WELLBUTRIN SR) 100 MG 12 hr tablet Take 100 mg by mouth 2 (two) times daily.     . cephALEXin (KEFLEX) 500 MG capsule Take 1 capsule (500 mg total) by mouth 3 (three) times daily. 21 capsule 0  . cholecalciferol (VITAMIN D) 1000 UNITS tablet Take 1 tablet (1,000 Units total) by mouth daily. 30 tablet 0  . enalapril (VASOTEC) 2.5 MG tablet Take 2.5 mg by mouth daily.     [name] lamoTRIgine (LAMICTAL) 100 MG tablet Take 1 tablet by mouth at bedtime.     [name] latanoprost (XALATAN) 0.005 % ophthalmic solution Place 1 drop into both eyes at bedtime.    [name] levothyroxine (SYNTHROID, LEVOTHROID) 100 MCG tablet Take 100 mcg by mouth daily before breakfast.     . lovastatin (MEVACOR) 40 MG tablet Take 1 tablet (40 mg total) by mouth nightly.  6  . pantoprazole (PROTONIX) 40 MG tablet Take 40 mg by mouth daily.     . risperiDONE (RISPERDAL) 0.25 MG tablet Take 0.25 mg by mouth at bedtime.     . vitamin B-12 (CYANOCOBALAMIN) 1000 MCG tablet Take 1,000 mcg by mouth daily.    [name] HYDROcodone-acetaminophen (NORCO/VICODIN) 5-325 MG per tablet Take by mouth.      Musculoskeletal: Strength & Muscle Tone: within normal limits Gait & Station: normal Patient leans: Backward  Psychiatric Specialty Exam: Physical Exam  Constitutional: She appears well-developed and  well-nourished.  HENT:  Head: Normocephalic and atraumatic.  Eyes: Conjunctivae are normal. Pupils are equal, round, and reactive to light.  Neck: Normal range of motion.  Cardiovascular: Normal heart sounds.   Respiratory: Effort normal.  GI: Soft.  Musculoskeletal: Normal range of motion.  Neurological: She is alert.  Skin: Skin is warm and dry.     Psychiatric: Thought content normal. Her mood appears anxious. Her affect is blunt. Her speech is delayed. She is slowed. Cognition and memory are impaired. She expresses impulsivity.    Review of Systems  Constitutional: Negative.   HENT: Negative.   Eyes: Negative.   Respiratory: Negative.   Cardiovascular: Negative.   Gastrointestinal: Negative.   Musculoskeletal: Negative.   Skin: Negative.   Neurological: Negative.   Psychiatric/Behavioral: Positive for depression, suicidal ideas and memory loss. Negative for hallucinations and substance abuse. The patient is nervous/anxious and has insomnia.     Blood pressure 118/61, pulse 62, temperature 98 F (36.7 C), temperature source Oral, resp. rate 16, height 5\' 7"  (1.702 m), weight 72.576 kg (160 lb), SpO2 99 %.Body mass index is 25.05 kg/(m^2).  General Appearance: Casual  Eye Contact::  Fair  Speech:  Slow  Volume:  Decreased  Mood:  Depressed  Affect:  Congruent  Thought Process:  Goal Directed  Orientation:  Full (Time, Place, and Person)  Thought Content:  Rumination  Suicidal Thoughts:  Yes.  without intent/plan  Homicidal Thoughts:  No  Memory:  Immediate;   Good Recent;   Poor Remote;   Fair  Judgement:  Impaired  Insight:  Good  Psychomotor Activity:  Decreased  Concentration:  Fair  Recall:  [employer] of Knowledge:Fair  Language: Fair  Akathisia:  No  Handed:  Right  AIMS (if indicated):     Assets:  Communication Skills Desire for Improvement Housing Social Support  ADL's:  Intact  Cognition: WNL  Sleep:      Medical Decision Making: Review of  Psycho-Social Stressors (1), Review or order clinical lab  tests (1), Established Problem, Worsening (2), Review of Medication Regimen & Side Effects (2) and Review of New Medication or Change in Dosage (2)  Treatment Plan Summary: Medication management and Plan 81 year old woman with bipolar disorder who has become much more depressed possibly with psychotic symptoms. Related to medicine noncompliance. Suicide attempt. Patient needs hospitalization to stabilize mood and behavior otherwise high risk of acting out and self injury again. Patient has been started back on her medicines as they are normally prescribed. Because of her age she is not a good fit for our psychiatric ward. We will be referring her to [facility] where she had more recently been hospitalized for other psychiatric units. Patient is understanding of the plan.  Plan:  Recommend psychiatric Inpatient admission when medically cleared. Supportive therapy provided about ongoing stressors. Discussed crisis plan, support from social network, calling 911, coming to the Emergency Department, and calling Suicide Hotline. Disposition: Admit to geriatric psychiatry as soon as we can find bed. Continue current medicine for now. Labs reviewed.  Stefan Markarian 01/03/2015 10:53 PM

## 2015-01-03 NOTE — ED Notes (Signed)
BEHAVIORAL HEALTH ROUNDING Patient sleeping: No. Patient alert and oriented: yes Behavior appropriate: Yes.  ; If no, describe:  Nutrition and fluids offered: Yes  Toileting and hygiene offered: Yes  Sitter present: no Law enforcement present: Yes  

## 2015-01-03 NOTE — ED Notes (Signed)
BEHAVIORAL HEALTH ROUNDING Patient sleeping: No. Patient alert and oriented: yes Behavior appropriate: Yes.  ; If no, describe:  Nutrition and fluids offered: Yes Toileting and hygiene offered: Yes  Sitter present: yes Law enforcement present: Yes ODS  

## 2015-01-03 NOTE — ED Notes (Signed)

## 2015-01-03 NOTE — ED Notes (Signed)
BEHAVIORAL HEALTH ROUNDING Patient sleeping: Yes.   Patient alert and oriented: yes Behavior appropriate: Yes.  ; If no, describe:  Nutrition and fluids offered: Yes  Toileting and hygiene offered: Yes  Sitter present: no Law enforcement present: Yes  

## 2015-01-03 NOTE — BHH Counselor (Signed)
Referral packet faxed to [facility].

## 2015-01-03 NOTE — ED Notes (Signed)
BEHAVIORAL HEALTH ROUNDING Patient sleeping: No. Patient alert and oriented: no Behavior appropriate: Yes.  ; If no, describe:  Nutrition and fluids offered: Yes  Toileting and hygiene offered: Yes  Sitter present: no Law enforcement present: Yes  

## 2015-01-03 NOTE — ED Provider Notes (Signed)
-----------------------------------------   11:17 AM on 01/03/2015 -----------------------------------------   Blood pressure 90/78, pulse 57, temperature 97.9 F (36.6 C), temperature source Oral, resp. rate 20, height 5\' 7"  (1.702 m), weight 160 lb (72.576 kg), SpO2 98 %.  The patient has had mild hypotension this morning, we'll give IV fluids and reassess.  Calm and cooperative at this time.  Disposition is pending per Psychiatry/Behavioral Medicine team recommendations.   ----------------------------------------- 12:04 PM on 01/03/2015 -----------------------------------------  The patient's blood pressure has improved with IV fluids. We'll continue to monitor.  ----------------------------------------- 2:54 PM on 01/03/2015 -----------------------------------------  Patient still with intermittent hypotension but she feels well, she is awake, alert, eating and drinking. We'll continue to monitor as this may be her new baseline. She has no tachycardia or tachypnea or fever.  Joanne Gavel, MD 01/03/15 1455

## 2015-01-03 NOTE — ED Notes (Signed)
BEHAVIORAL HEALTH ROUNDING Patient sleeping: Yes.   Patient alert and oriented: yes Behavior appropriate: Yes.  ; If no, describe:  Nutrition and fluids offered: Yes  Toileting and hygiene offered: Yes  Sitter present: yes Law enforcement present: Yes ODS  

## 2015-01-04 DIAGNOSIS — F315 Bipolar disorder, current episode depressed, severe, with psychotic features: Secondary | ICD-10-CM | POA: Diagnosis not present

## 2015-01-04 DIAGNOSIS — F314 Bipolar disorder, current episode depressed, severe, without psychotic features: Secondary | ICD-10-CM | POA: Diagnosis not present

## 2015-01-04 NOTE — ED Notes (Signed)
BEHAVIORAL HEALTH ROUNDING Patient sleeping: No. Patient alert and oriented: yes Behavior appropriate: Yes.  ; If no, describe:  Nutrition and fluids offered: Yes  Toileting and hygiene offered: Yes  Sitter present: yes Law enforcement present: No

## 2015-01-04 NOTE — ED Notes (Signed)
Son called and requested to know if pt will be transferred to [facility] today. Informed him that I do not know and he is welcome to call back. He requested we call him if she is sent out. Berdine Addison [phone]

## 2015-01-04 NOTE — ED Notes (Signed)
BEHAVIORAL HEALTH ROUNDING Patient sleeping: Yes.   Patient alert and oriented: sleeping Behavior appropriate: Yes.  ; If no, describe: sleeping Nutrition and fluids offered: sleeping Toileting and hygiene offered: sleeping Sitter present: no Law enforcement present: Yes  and ODS 

## 2015-01-04 NOTE — ED Notes (Signed)
Sent note to pharmacy for missing doses of B12 and risperidone
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
## 2015-01-04 NOTE — ED Notes (Addendum)
BEHAVIORAL HEALTH ROUNDING Patient sleeping: No. Patient alert and oriented: yes Behavior appropriate: Yes.  ; If no, describe:  Nutrition and fluids offered: Yes  Toileting and hygiene offered: Yes  Sitter present: no Law enforcement present: Yes  

## 2015-01-04 NOTE — ED Notes (Signed)
BEHAVIORAL HEALTH ROUNDING Patient sleeping: No. Patient alert and oriented: yes Behavior appropriate: Yes.  ; If no, describe:  Nutrition and fluids offered: Yes  Toileting and hygiene offered: Yes  Sitter present: no Law enforcement present: Yes  

## 2015-01-04 NOTE — ED Provider Notes (Signed)
-----------------------------------------   2:09 AM on 01/04/2015 -----------------------------------------  Patient presents under involuntary commitment for depression, and suicidal ideation. Stephanie Anthony has been seen by Dr. Franchot Mimes, who recommends inpatient admission. Patient's labs are largely within normal limits. Patient was initially hypotensive, but her blood pressures have been normotensive and has stabilized over the past 12 hours. I believe at this point patient is medically clear for psychiatric inpatient admission.  Harvest Dark, MD 01/04/15 [phone]

## 2015-01-04 NOTE — ED Notes (Signed)
BEHAVIORAL HEALTH ROUNDING Patient sleeping: No. Patient alert and oriented: yes Behavior appropriate: Yes.  ; If no, describe:  Nutrition and fluids offered: yes Toileting and hygiene offered: Yes  Sitter present: no Law enforcement present: Yes

## 2015-01-04 NOTE — BHH Counselor (Addendum)
Patient has been accepted at [facility], per Dr. Brantley Fling.  Pt can be transported tonight, per [employer] [phone].  Call report right before patient leaves.

## 2015-01-04 NOTE — ED Notes (Signed)

## 2015-01-04 NOTE — ED Notes (Signed)
BEHAVIORAL HEALTH ROUNDING Patient sleeping: No. Patient alert and oriented: alert; not oriented Behavior appropriate: Yes.  ; If no, describe:  Nutrition and fluids offered: Yes  Toileting and hygiene offered: Yes  Sitter present: not applicable Law enforcement present: Yes

## 2015-01-04 NOTE — BH Assessment (Signed)
  Referral information for Geriatric Placement have been faxed to;    [facility]([phone])-Information resent.   [facility]([phone])-No beds but accepting referrals.   [facility]([phone])-Informations resent   [facility]([phone])-No beds   [facility]([phone])-No beds   [facility]([phone])-No beds    Called and spoke with the son([name] Shrider-[phone]) and update on status of patient being placed in a [facility].

## 2015-01-04 NOTE — Consult Note (Signed)
[facility] Face-to-Face Psychiatry Consult   Reason for Consult:  Consult for this 81 year old woman with a history of bipolar disorder in the hospital after a suicide attempt Referring Physician:  Archie Balboa Patient Identification: Stephanie Anthony MRN:  [id number] Principal Diagnosis: Bipolar disorder, current episode depressed, severe, without psychotic features Diagnosis:   Patient Active Problem List   Diagnosis Date Noted  . Enteritis [K52.9] 10/17/2014  . Bipolar disorder, current episode depressed, severe, without psychotic features [F31.4]   . Bipolar affective disorder, current episode depressed [F31.30] 09/25/2014    Total Time spent with patient: 1 hour  Subjective:   Stephanie Anthony is a 81 y.o. female patient admitted with "I have a chemical imbalance".  HPI:  Information from the patient and the chart. Patient reports that she has recently been going downhill and feeling much more depressed for at least the last week. Sleep is been poor. Appetite poor. Feeling nervous and worried all the time. Has been obsessed with the idea that she will no longer be able to drive or to care for herself. She says she is that she does not have hallucinations but has this strange foreboding feeling like something is controlling her. She had not been recently compliant with her medicines. She had stopped her cut back on all of them for no really good reason. Patient cut her left wrist fairly badly it's a rather ugly cut. Says that she's been having suicidal thoughts. Sister noticed that she was getting worse and assisted in her coming into the hospital.  Past psychiatric history: Long history of bipolar disorder. Multiple prior hospitalizations. Positive prior suicide attempts. Has tried several medicines in the past but recently has been stable on a combination of Lamictal and Risperdal bupropion and alprazolam.  Medical history: Patient has a history of high blood pressure gastric reflux symptoms elevated  cholesterol. More recently had had a infection of some sort in her esophagus but says that she has completely recovered from that.  Social history: Lives with her sister and 2 nephews. Sounds like they get along together okay.  Substance abuse history: Denies any history of current or past alcohol or drug problems  Update as of Thursday. Patient remains very depressed and withdrawn. Not displaying acutely dangerous behavior. Tolerating medication. Vital signs stable. We are awaiting transfer to geriatric psychiatry unit. HPI Elements:   Quality:  Depressed mood with suicidal behavior. Severity:  Severe life-threatening. Timing:  Worse over the last week or 2. Duration:  Ongoing. Context:  Medicine noncompliance.  Past Medical History:  Past Medical History  Diagnosis Date  . Bipolar 1 disorder   . Seasonal allergies   . Murmur, cardiac   . Coronary artery disease   . Chronic mitral valve disease   . Hypertension   . Chronic kidney disease     stage 3  . Deviated nasal septum   . Renal cyst, left     bilateral  . Nephrogenic diabetes insipidus     Past Surgical History  Procedure Laterality Date  . Thyroidectomy    . Cholecystectomy    . Esophagogastroduodenoscopy (egd) with propofol N/A 10/20/2014    Procedure: ESOPHAGOGASTRODUODENOSCOPY (EGD) WITH PROPOFOL;  Surgeon: [name], MD;  Location: [facility] ENDOSCOPY;  Service: Endoscopy;  Laterality: N/A;  . Cardiac catheterization    . Knee replacement, right     Family History:  Family History  Problem Relation Age of Onset  . Heart Problems Mother   . Seizures Mother   . Stroke  Mother   . Diverticulitis Mother   . Heart attack Father   . Heart attack Sister   . Pancreatitis Sister   . Cancer Sister   . Cancer Brother    Social History:  History  Alcohol Use No     History  Drug Use No    Social History   Social History  . Marital Status: Widowed    Spouse Name: N/A  . Number of Children: N/A  . Years  of Education: N/A   Social History Main Topics  . Smoking status: Former Research scientist (life sciences)  . Smokeless tobacco: Never Used     Comment: quit 1964  . Alcohol Use: No  . Drug Use: No  . Sexual Activity: No   Other Topics Concern  . None   Social History Narrative   Additional Social History:    Pain Medications: None Reported Prescriptions: None Reported Over the Counter: None Reported History of alcohol / drug use?: No history of alcohol / drug abuse Longest period of sobriety (when/how long): N/A Negative Consequences of Use:  (N/A) Withdrawal Symptoms:  (N/A)                     Allergies:   Allergies  Allergen Reactions  . Aspirin Other (See Comments)    Reaction: Rectal bleeding  . Codeine Nausea And Vomiting  . Codeine Sulfate Nausea Only  . Prednisone Other (See Comments)    Reaction: Hallucinations  . Valium [Diazepam] Other (See Comments)    Reaction: Strange feelings    Labs:  No results found for this or any previous visit (from the past 48 hour(s)).  Vitals: Blood pressure 101/55, pulse 55, temperature 97.3 F (36.3 C), temperature source Oral, resp. rate 18, height 5\' 7"  (1.702 m), weight 72.576 kg (160 lb), SpO2 99 %.  Risk to Self: Suicidal Ideation: Yes-Currently Present Suicidal Intent: Yes-Currently Present Is patient at risk for suicide?: Yes Suicidal Plan?: Yes-Currently Present Specify Current Suicidal Plan: Cut Wrists Access to Means: Yes Specify Access to Suicidal Means: Access to Knives What has been your use of drugs/alcohol within the last 12 months?: None Reported How many times?: 1 Other Self Harm Risks: None Noted Triggers for Past Attempts: Unknown Intentional Self Injurious Behavior: None Risk to Others: Homicidal Ideation: No Thoughts of Harm to Others: No Current Homicidal Intent: No Current Homicidal Plan: No Access to Homicidal Means: No Identified Victim: N/A History of harm to others?: No Assessment of Violence: None  Noted Violent Behavior Description: N/A Does patient have access to weapons?: Yes (Comment) (Access to [employer]) Criminal Charges Pending?: No Does patient have a court date: No Prior Inpatient Therapy: Prior Inpatient Therapy: Yes Prior Therapy Dates: "12 days", "3 or 4 months ago", per son Prior Therapy Facilty/Provider(s): [facility] Reason for Treatment: "I was tryna hurt my wrist, thats why they sent me there" Prior Outpatient Therapy: Prior Outpatient Therapy: No Prior Therapy Dates: N/A Prior Therapy Facilty/Provider(s): N/A Reason for Treatment: N/A Does patient have an ACCT team?: No Does patient have Intensive In-House Services?  : No Does patient have [facility] services? : No Does patient have P4CC services?: No  Current Facility-Administered Medications  Medication Dose Route Frequency Provider Last Rate Last Dose  . ALPRAZolam Duanne Moron) tablet 0.5 mg  0.5 mg Oral QHS Daymon Larsen, MD   0.5 mg at 01/04/15 0216  . buPROPion (WELLBUTRIN SR) 12 hr tablet 100 mg  100 mg Oral BID Daymon Larsen, MD   100  mg at 01/04/15 1017  . cholecalciferol (VITAMIN D) tablet 1,000 Units  1,000 Units Oral Daily Daymon Larsen, MD   1,000 Units at 01/04/15 1017  . enalapril (VASOTEC) tablet 2.5 mg  2.5 mg Oral Daily Daymon Larsen, MD   2.5 mg at 01/03/15 1012  . lamoTRIgine (LAMICTAL) tablet 100 mg  100 mg Oral QHS Daymon Larsen, MD   100 mg at 01/04/15 7408  . latanoprost (XALATAN) 0.005 % ophthalmic solution 1 drop  1 drop Both Eyes QHS Daymon Larsen, MD      . levothyroxine (SYNTHROID, LEVOTHROID) tablet 100 mcg  100 mcg Oral QAC breakfast Daymon Larsen, MD   100 mcg at 01/04/15 0755  . pantoprazole (PROTONIX) EC tablet 40 mg  40 mg Oral Daily Daymon Larsen, MD   40 mg at 01/04/15 1017  . pravastatin (PRAVACHOL) tablet 40 mg  40 mg Oral q1800 Daymon Larsen, MD   40 mg at 01/03/15 1807  . risperiDONE (RISPERDAL) tablet 0.25 mg  0.25 mg Oral Daily Daymon Larsen, MD   0.25 mg at  01/03/15 1014  . vitamin B-12 (CYANOCOBALAMIN) tablet 1,000 mcg  1,000 mcg Oral Daily Daymon Larsen, MD   1,000 mcg at 01/03/15 1014   Current Outpatient Prescriptions  Medication Sig Dispense Refill  . ALPRAZolam (XANAX) 0.25 MG tablet Take 1 tablet by mouth at bedtime as needed.    [name] buPROPion (WELLBUTRIN SR) 100 MG 12 hr tablet Take 100 mg by mouth 2 (two) times daily.     . cephALEXin (KEFLEX) 500 MG capsule Take 1 capsule (500 mg total) by mouth 3 (three) times daily. 21 capsule 0  . cholecalciferol (VITAMIN D) 1000 UNITS tablet Take 1 tablet (1,000 Units total) by mouth daily. 30 tablet 0  . enalapril (VASOTEC) 2.5 MG tablet Take 2.5 mg by mouth daily.     [name] lamoTRIgine (LAMICTAL) 100 MG tablet Take 1 tablet by mouth at bedtime.     [name] latanoprost (XALATAN) 0.005 % ophthalmic solution Place 1 drop into both eyes at bedtime.    [name] levothyroxine (SYNTHROID, LEVOTHROID) 100 MCG tablet Take 100 mcg by mouth daily before breakfast.     . lovastatin (MEVACOR) 40 MG tablet Take 1 tablet (40 mg total) by mouth nightly.  6  . pantoprazole (PROTONIX) 40 MG tablet Take 40 mg by mouth daily.     . risperiDONE (RISPERDAL) 0.25 MG tablet Take 0.25 mg by mouth at bedtime.     . vitamin B-12 (CYANOCOBALAMIN) 1000 MCG tablet Take 1,000 mcg by mouth daily.    [name] HYDROcodone-acetaminophen (NORCO/VICODIN) 5-325 MG per tablet Take by mouth.      Musculoskeletal: Strength & Muscle Tone: within normal limits Gait & Station: normal Patient leans: Backward  Psychiatric Specialty Exam: Physical Exam  Vitals reviewed. Constitutional: She appears well-developed and well-nourished.  HENT:  Head: Normocephalic and atraumatic.  Eyes: Conjunctivae are normal. Pupils are equal, round, and reactive to light.  Neck: Normal range of motion.  Cardiovascular: Normal heart sounds.   Respiratory: Effort normal.  GI: Soft.  Musculoskeletal: Normal range of motion.  Neurological: She is alert.  Skin: Skin is warm  and dry.     Psychiatric: Thought content normal. Her mood appears anxious. Her affect is blunt. Her speech is delayed. She is slowed. Cognition and memory are impaired. She expresses impulsivity.    Review of Systems  Constitutional: Negative.   HENT: Negative.   Eyes: Negative.  Respiratory: Negative.   Cardiovascular: Negative.   Gastrointestinal: Negative.   Musculoskeletal: Negative.   Skin: Negative.   Neurological: Negative.   Psychiatric/Behavioral: Positive for depression, suicidal ideas and memory loss. Negative for hallucinations and substance abuse. The patient is nervous/anxious and has insomnia.     Blood pressure 101/55, pulse 55, temperature 97.3 F (36.3 C), temperature source Oral, resp. rate 18, height 5\' 7"  (1.702 m), weight 72.576 kg (160 lb), SpO2 99 %.Body mass index is 25.05 kg/(m^2).  General Appearance: Casual  Eye Contact::  Fair  Speech:  Slow  Volume:  Decreased  Mood:  Depressed  Affect:  Congruent  Thought Process:  Goal Directed  Orientation:  Full (Time, Place, and Person)  Thought Content:  Rumination  Suicidal Thoughts:  Yes.  without intent/plan  Homicidal Thoughts:  No  Memory:  Immediate;   Good Recent;   Poor Remote;   Fair  Judgement:  Impaired  Insight:  Good  Psychomotor Activity:  Decreased  Concentration:  Fair  Recall:  [employer] of Knowledge:Fair  Language: Fair  Akathisia:  No  Handed:  Right  AIMS (if indicated):     Assets:  Communication Skills Desire for Improvement Housing Social Support  ADL's:  Intact  Cognition: WNL  Sleep:      Medical Decision Making: Review of Psycho-Social Stressors (1), Review or order clinical lab tests (1), Established Problem, Worsening (2), Review of Medication Regimen & Side Effects (2) and Review of New Medication or Change in Dosage (2)  Treatment Plan Summary: Medication management and Plan Continue current treatment and medication. Supportive counseling. Continue referral to  geriatric psychiatry as soon as possible.  Plan:  Recommend psychiatric Inpatient admission when medically cleared. Supportive therapy provided about ongoing stressors. Discussed crisis plan, support from social network, calling 911, coming to the Emergency Department, and calling Suicide Hotline. Disposition: Admit to geriatric psychiatry as soon as we can find bed. Continue current medicine for now. Labs reviewed.  Benn Tarver 01/04/2015 4:39 PM

## 2015-01-04 NOTE — ED Notes (Signed)
BEHAVIORAL HEALTH ROUNDING Patient sleeping: no Patient alert and oriented: yes Behavior appropriate: Yes.  ; If no, describe:  Nutrition and fluids offered: Yes  Toileting and hygiene offered: Yes  Sitter present: no Law enforcement present: Yes  

## 2015-01-05 ENCOUNTER — Encounter: Payer: Self-pay | Admitting: Gastroenterology

## 2015-01-05 NOTE — ED Notes (Signed)
Pt OOB to bathroom.  Pt prepared for transport.

## 2015-01-05 NOTE — ED Notes (Signed)
BEHAVIORAL HEALTH ROUNDING Patient sleeping: No. Patient alert and oriented: alert; not oriented Behavior appropriate: Yes.  ; If no, describe:  Nutrition and fluids offered: Yes  Toileting and hygiene offered: Yes  Sitter present: not applicable Law enforcement present: Yes

## 2015-01-05 NOTE — ED Notes (Signed)
IV present (22 gauge right hand), but no such LDA listed in chart.  RN removed existing PIV for pt tranfer.  Pt tolerated well.

## 2015-04-19 ENCOUNTER — Encounter: Payer: Self-pay | Admitting: Urology

## 2015-04-20 ENCOUNTER — Ambulatory Visit (INDEPENDENT_AMBULATORY_CARE_PROVIDER_SITE_OTHER): Payer: Medicare Other | Admitting: Urology

## 2015-04-20 ENCOUNTER — Encounter: Payer: Self-pay | Admitting: Urology

## 2015-04-20 VITALS — BP 123/69 | HR 69 | Ht 67.0 in | Wt 163.1 lb

## 2015-04-20 DIAGNOSIS — N183 Chronic kidney disease, stage 3 unspecified: Secondary | ICD-10-CM

## 2015-04-20 DIAGNOSIS — N281 Cyst of kidney, acquired: Secondary | ICD-10-CM

## 2015-04-20 NOTE — Progress Notes (Signed)
04/20/2015 4:27 PM   Stephanie Anthony 02-09-1934 [id number]  Referring provider: [name], MD [name] [place], [place] [address]  Chief Complaint  Patient presents with  . Follow-up    bilateral renal cyst     HPI: 81 yo F followed for history of Bosniak II hyperdense bilateral renal cysts since 2011. Prior to being followed at [facility] urological, she was followed by Dr. [name] at [facility] urology.  She is due for follow up renal ultrasound.  Her follow-up has been delayed due admissions for the management of bipolar and SI.  No voiding issues or flank pain.    No flank pain.   Most recent Cr 1.87, history of CKD III (? secondary to lithium).   PMH: Past Medical History  Diagnosis Date  . Bipolar 1 disorder ([place])   . Seasonal allergies   . Murmur, cardiac   . Coronary artery disease   . Chronic mitral valve disease   . Hypertension   . Chronic kidney disease     stage 3  . Deviated nasal septum   . Renal cyst, left     bilateral  . Nephrogenic diabetes insipidus ([place])   . Chronic kidney disease     Surgical History: Past Surgical History  Procedure Laterality Date  . Thyroidectomy    . Cholecystectomy    . Esophagogastroduodenoscopy (egd) with propofol N/A 10/20/2014    Procedure: ESOPHAGOGASTRODUODENOSCOPY (EGD) WITH PROPOFOL;  Surgeon: [name], MD;  Location: [facility] ENDOSCOPY;  Service: Endoscopy;  Laterality: N/A;  . Cardiac catheterization    . Knee replacement, right      Home Medications:    Medication List       This list is accurate as of: 04/20/15  4:27 PM.  Always use your most recent med list.               ALPRAZolam 0.25 MG tablet  Commonly known as:  XANAX  Take 1 tablet by mouth at bedtime as needed.     buPROPion 100 MG 12 hr tablet  Commonly known as:  WELLBUTRIN SR  Take 100 mg by mouth 2 (two) times daily.     cholecalciferol 1000 UNITS tablet  Commonly known as:  VITAMIN D    Take 1 tablet (1,000 Units total) by mouth daily.     enalapril 2.5 MG tablet  Commonly known as:  VASOTEC  Take 2.5 mg by mouth daily.     furosemide 20 MG tablet  Commonly known as:  LASIX  Take by mouth.     HYDROcodone-acetaminophen 5-325 MG tablet  Commonly known as:  NORCO/VICODIN  Take by mouth.     lamoTRIgine 100 MG tablet  Commonly known as:  LAMICTAL  Take 1 tablet by mouth at bedtime.     latanoprost 0.005 % ophthalmic solution  Commonly known as:  XALATAN  Place 1 drop into both eyes at bedtime.     levothyroxine 100 MCG tablet  Commonly known as:  SYNTHROID, LEVOTHROID  Take 100 mcg by mouth daily before breakfast.     lovastatin 40 MG tablet  Commonly known as:  MEVACOR  Take 1 tablet (40 mg total) by mouth nightly.     ondansetron 4 MG tablet  Commonly known as:  ZOFRAN  TAKE ONE TABLET BY MOUTH EVERY 4 HOURS AS NEEDED FOR NAUSEA     pantoprazole 40 MG tablet  Commonly known as:  PROTONIX  Take 40 mg by mouth  daily.     risperiDONE 0.25 MG tablet  Commonly known as:  RISPERDAL  Take 0.25 mg by mouth at bedtime.     temazepam 15 MG capsule  Commonly known as:  RESTORIL  TAKE ONE CAPSULE BY MOUTH AT BEDTIME FOR INSOMNIA     venlafaxine XR 75 MG 24 hr capsule  Commonly known as:  EFFEXOR-XR  Take 225 mg by mouth daily.     vitamin B-12 1000 MCG tablet  Commonly known as:  CYANOCOBALAMIN  Take 1,000 mcg by mouth daily.        Allergies:  Allergies  Allergen Reactions  . Aspirin Other (See Comments)    Reaction: Rectal bleeding  . Codeine Nausea And Vomiting  . Codeine Sulfate Nausea Only  . Prednisone Other (See Comments)    Reaction: Hallucinations  . Valium [Diazepam] Other (See Comments)    Strange feelings. Reaction: Strange feelings    Family History: Family History  Problem Relation Age of Onset  . Heart Problems Mother   . Seizures Mother   . Stroke Mother   . Diverticulitis Mother   . Heart attack Father   . Heart  attack Sister   . Pancreatitis Sister   . Cancer Sister   . Cancer Brother     Social History:  reports that she has quit smoking. She has never used smokeless tobacco. She reports that she does not drink alcohol or use illicit drugs.  ROS: UROLOGY Frequent Urination?: Yes Hard to postpone urination?: Yes Burning/pain with urination?: No Get up at night to urinate?: Yes Leakage of urine?: Yes Urine stream starts and stops?: No Trouble starting stream?: No Do you have to strain to urinate?: No Blood in urine?: No Urinary tract infection?: No Sexually transmitted disease?: No Injury to kidneys or bladder?: No Painful intercourse?: No Weak stream?: No Currently pregnant?: No Vaginal bleeding?: No Last menstrual period?: n  Gastrointestinal Nausea?: No Vomiting?: No Indigestion/heartburn?: Yes Diarrhea?: No Constipation?: No  Constitutional Fever: No Night sweats?: No Weight loss?: No Fatigue?: No  Skin Skin rash/lesions?: Yes Itching?: Yes  Eyes Blurred vision?: Yes Double vision?: No  Ears/Nose/Throat Sore throat?: No Sinus problems?: No  Hematologic/Lymphatic Swollen glands?: No Easy bruising?: Yes  Cardiovascular Leg swelling?: Yes Chest pain?: Yes  Respiratory Cough?: Yes Shortness of breath?: Yes  Endocrine Excessive thirst?: No  Musculoskeletal Back pain?: Yes Joint pain?: No  Neurological Headaches?: No Dizziness?: Yes  Psychologic Depression?: Yes Anxiety?: Yes  Physical Exam: BP 123/69 mmHg  Pulse 69  Ht 5\' 7"  (1.702 m)  Wt 163 lb 1.6 oz (73.982 kg)  BMI 25.54 kg/m2  Constitutional:  Alert and oriented, No acute distress.   HEENT: [place] AT, moist mucus membranes.  Trachea midline, no masses. Cardiovascular: No clubbing, cyanosis, or edema. Respiratory: Normal respiratory effort, no increased work of breathing. GI: Abdomen is soft, nontender, nondistended, no abdominal masses GU: No CVA tenderness.  Skin: No rashes, bruises  or suspicious lesions.  Wrist scars noted.   Neurologic: Grossly intact, no focal deficits, moving all 4 extremities. Psychiatric: Normal mood and affect.  Somewhat tangential.    Laboratory Data: Lab Results  Component Value Date   WBC 9.2 01/02/2015   HGB 14.2 01/02/2015   HCT 43.0 01/02/2015   MCV 78.1* 01/02/2015   PLT 174 01/02/2015    Lab Results  Component Value Date   CREATININE 1.87* 01/02/2015    Pertinent Imaging: No recent imaging  Assessment & Plan:    1. Acquired cyst  of kidney Ordered follow up renal ultrasound Will call patient with results Follow up plan TBD based on results  2. Chronic kidney disease (CKD), stage III (moderate) 2/2 Lithium per patient Followed by Dr. Holley Raring Not candidate for imaging with contrast   Return for TBD pending renal ultrasound results.  Hollice Espy, MD  [facility] [address] [place], [place] [address] [phone]

## 2015-04-26 ENCOUNTER — Ambulatory Visit
Admission: RE | Admit: 2015-04-26 | Discharge: 2015-04-26 | Disposition: A | Discharge status: Home / Self Care | Payer: Medicare Other | Source: Ambulatory Visit | Attending: Urology | Admitting: Urology

## 2015-04-26 DIAGNOSIS — N281 Cyst of kidney, acquired: Secondary | ICD-10-CM | POA: Diagnosis not present

## 2015-05-01 ENCOUNTER — Encounter: Payer: Self-pay | Admitting: Emergency Medicine

## 2015-05-01 ENCOUNTER — Emergency Department
Admission: EM | Admit: 2015-05-01 | Discharge: 2015-05-01 | Disposition: A | Discharge status: Home / Self Care | Payer: Medicare Other | Attending: Student | Admitting: Student

## 2015-05-01 ENCOUNTER — Emergency Department: Payer: Medicare Other

## 2015-05-01 DIAGNOSIS — T50905A Adverse effect of unspecified drugs, medicaments and biological substances, initial encounter: Secondary | ICD-10-CM

## 2015-05-01 DIAGNOSIS — R4182 Altered mental status, unspecified: Secondary | ICD-10-CM | POA: Insufficient documentation

## 2015-05-01 DIAGNOSIS — N183 Chronic kidney disease, stage 3 (moderate): Secondary | ICD-10-CM | POA: Insufficient documentation

## 2015-05-01 DIAGNOSIS — T43595A Adverse effect of other antipsychotics and neuroleptics, initial encounter: Secondary | ICD-10-CM | POA: Insufficient documentation

## 2015-05-01 DIAGNOSIS — I129 Hypertensive chronic kidney disease with stage 1 through stage 4 chronic kidney disease, or unspecified chronic kidney disease: Secondary | ICD-10-CM | POA: Insufficient documentation

## 2015-05-01 DIAGNOSIS — R5383 Other fatigue: Secondary | ICD-10-CM | POA: Insufficient documentation

## 2015-05-01 DIAGNOSIS — R531 Weakness: Secondary | ICD-10-CM | POA: Diagnosis present

## 2015-05-01 DIAGNOSIS — Z87891 Personal history of nicotine dependence: Secondary | ICD-10-CM | POA: Diagnosis not present

## 2015-05-01 DIAGNOSIS — Z79899 Other long term (current) drug therapy: Secondary | ICD-10-CM | POA: Diagnosis not present

## 2015-05-01 DIAGNOSIS — R42 Dizziness and giddiness: Secondary | ICD-10-CM | POA: Insufficient documentation

## 2015-05-01 DIAGNOSIS — G4700 Insomnia, unspecified: Secondary | ICD-10-CM | POA: Diagnosis not present

## 2015-05-01 DIAGNOSIS — F909 Attention-deficit hyperactivity disorder, unspecified type: Secondary | ICD-10-CM | POA: Diagnosis not present

## 2015-05-01 LAB — CBC WITH DIFFERENTIAL/PLATELET
BASOS ABS: 0.1 10*3/uL (ref 0–0.1)
Basophils Relative: 1 %
EOS ABS: 0.2 10*3/uL (ref 0–0.7)
Eosinophils Relative: 3 %
HCT: 38.5 % (ref 35.0–47.0)
HEMOGLOBIN: 12.8 g/dL (ref 12.0–16.0)
LYMPHS ABS: 1.8 10*3/uL (ref 1.0–3.6)
LYMPHS PCT: 23 %
MCH: 25.7 pg — AB (ref 26.0–34.0)
MCHC: 33.3 g/dL (ref 32.0–36.0)
MCV: 77 fL — AB (ref 80.0–100.0)
Monocytes Absolute: 0.9 10*3/uL (ref 0.2–0.9)
Monocytes Relative: 11 %
NEUTROS PCT: 62 %
Neutro Abs: 4.8 10*3/uL (ref 1.4–6.5)
PLATELETS: 202 10*3/uL (ref 150–440)
RBC: 5 MIL/uL (ref 3.80–5.20)
RDW: 14.5 % (ref 11.5–14.5)
WBC: 7.8 10*3/uL (ref 3.6–11.0)

## 2015-05-01 LAB — COMPREHENSIVE METABOLIC PANEL
ALK PHOS: 47 U/L (ref 38–126)
ALT: 17 U/L (ref 14–54)
AST: 31 U/L (ref 15–41)
Albumin: 4.2 g/dL (ref 3.5–5.0)
Anion gap: 6 (ref 5–15)
BUN: 21 mg/dL — AB (ref 6–20)
CALCIUM: 9.6 mg/dL (ref 8.9–10.3)
CHLORIDE: 110 mmol/L (ref 101–111)
CO2: 25 mmol/L (ref 22–32)
CREATININE: 1.34 mg/dL — AB (ref 0.44–1.00)
GFR calc non Af Amer: 37 mL/min — ABNORMAL LOW (ref >60)
GFR, EST AFRICAN AMERICAN: 42 mL/min — AB (ref >60)
Glucose, Bld: 97 mg/dL (ref 65–99)
Potassium: 3.9 mmol/L (ref 3.5–5.1)
SODIUM: 141 mmol/L (ref 135–145)
Total Bilirubin: 0.8 mg/dL (ref 0.3–1.2)
Total Protein: 7.6 g/dL (ref 6.5–8.1)

## 2015-05-01 LAB — URINALYSIS COMPLETE WITH MICROSCOPIC (ARMC ONLY)
BACTERIA UA: NONE SEEN
Bilirubin Urine: NEGATIVE
Glucose, UA: NEGATIVE mg/dL
KETONES UR: NEGATIVE mg/dL
Leukocytes, UA: NEGATIVE
Nitrite: NEGATIVE
PH: 6 (ref 5.0–8.0)
PROTEIN: NEGATIVE mg/dL
SQUAMOUS EPITHELIAL / LPF: NONE SEEN
Specific Gravity, Urine: 1.004 — ABNORMAL LOW (ref 1.005–1.030)

## 2015-05-01 LAB — T4, FREE: FREE T4: 0.86 ng/dL (ref 0.61–1.12)

## 2015-05-01 LAB — TSH: TSH: 14.604 u[IU]/mL — AB (ref 0.350–4.500)

## 2015-05-01 LAB — TROPONIN I: Troponin I: <0.03 ng/mL (ref <0.031)

## 2015-05-01 NOTE — ED Notes (Signed)
Patient transported to X-ray 

## 2015-05-01 NOTE — ED Notes (Signed)
Lab called for add on of tsh and t4

## 2015-05-01 NOTE — ED Notes (Signed)
MD at bedside. 

## 2015-05-01 NOTE — ED Notes (Addendum)
Pt comes from home via ems for weakness x few weeks. Pt has hx of bipolar and meds have been changed recently. Pt A&O. Pt complains of weakness, dizziness and feeling hyper. Pt states she fell this evening. NO hx of anticoags and notable brusing or bleeding

## 2015-05-01 NOTE — ED Provider Notes (Signed)
[facility] Emergency Department Provider Note  ____________________________________________  Time seen: Approximately 4:40 PM  I have reviewed the triage vital signs and the nursing notes.   HISTORY  Chief Complaint Weakness and Altered Mental Status    HPI Stephanie Anthony is a 81 y.o. female with history of coronary artery disease, COPD, bipolar disorder who presents for evaluation of 2 weeks generalized weakness and fatigue, intermittently feeling hyperactive, insomnia, lightheaded with position change, gradual onset, intermittent, currently mild. Patient reports that this all began when she was started on a new medication for bipolar disorder called Rexulti (brexpiprazole)2 weeks ago. Since that time, she has had the above symptoms. Today she stood up suddenly while she was in the kitchen and experienced lightheadedness and nearly fell, her back leaned onto the refrigerator and a family member caught her. She did not fall, hit her head or lose consciousness per she has no pain complaints at this time. No chest pain or difficulty breathing. No vomiting, fevers or chills. No other modifying factors. She denies suicidal ideation, homicidal ideation or audiovisual hallucinations.   Past Medical History  Diagnosis Date  . Bipolar 1 disorder (Stephanie Anthony)   . Seasonal allergies   . Murmur, cardiac   . Coronary artery disease   . Chronic mitral valve disease   . Hypertension   . Chronic kidney disease     stage 3  . Deviated nasal septum   . Renal cyst, left     bilateral  . 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 Chronic kidney disease     Patient Active Problem List   Diagnosis Date Noted  . Severe bipolar I disorder with depression ([place]) 11/16/2014  . Enteritis 10/17/2014  . Gastroenteritis, non-infectious 10/17/2014  . Chronic kidney disease (CKD), stage III (moderate) 09/27/2014  . 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  . H/O  nutritional disorder 09/27/2014  . HLD (hyperlipidemia) 09/27/2014  . Heart valve incompetence 09/27/2014  . Mitral and aortic incompetence 09/27/2014  . Personal history of other endocrine, nutritional and metabolic disease [id number]  . Hypothyroidism, postop 09/27/2014  . Bipolar disorder, current episode depressed, severe, without psychotic features (Stephanie Anthony)   . Bipolar affective disorder, current episode depressed (Stephanie Anthony) 09/25/2014  . Bipolar I disorder, most recent episode depressed (Stephanie Anthony) 09/25/2014  . Chronic kidney disease, stage IV (severe) (Stephanie Anthony) 09/19/2014  . Pure hypercholesterolemia 09/19/2014  . Acquired hypothyroidism 03/29/2014  . Bipolar affective disorder (Stephanie Anthony) 03/29/2014  . Bradycardia 03/29/2014  . Essential (primary) hypertension 03/29/2014  . MI (mitral incompetence) 03/29/2014  . Acquired cyst of kidney 06/18/2012    Past Surgical History  Procedure Laterality Date  . Thyroidectomy    . Cholecystectomy    . Esophagogastroduodenoscopy (egd) with propofol N/A 10/20/2014    Procedure: ESOPHAGOGASTRODUODENOSCOPY (EGD) WITH PROPOFOL;  Surgeon: [name], MD;  Location: [facility] ENDOSCOPY;  Service: Endoscopy;  Laterality: N/A;  . Cardiac catheterization    . Knee replacement, right      Current Outpatient Rx  Name  Route  Sig  Dispense  Refill  . ALPRAZolam (XANAX) 0.25 MG tablet   Oral   Take 1 tablet by mouth at bedtime as needed.         [name] buPROPion (WELLBUTRIN SR) 100 MG 12 hr tablet   Oral   Take 100 mg by mouth 2 (two) times daily.          . cholecalciferol (VITAMIN D) 1000 UNITS tablet   Oral   Take 1 tablet (  1,000 Units total) by mouth daily.   30 tablet   0   . enalapril (VASOTEC) 2.5 MG tablet   Oral   Take 2.5 mg by mouth daily.          . furosemide (LASIX) 20 MG tablet   Oral   Take by mouth.         [name] HYDROcodone-acetaminophen (NORCO/VICODIN) 5-325 MG per tablet   Oral   Take by mouth.         . lamoTRIgine (LAMICTAL)  100 MG tablet   Oral   Take 1 tablet by mouth at bedtime.          [name] latanoprost (XALATAN) 0.005 % ophthalmic solution   Both Eyes   Place 1 drop into both eyes at bedtime.         [name] levothyroxine (SYNTHROID, LEVOTHROID) 100 MCG tablet   Oral   Take 100 mcg by mouth daily before breakfast.          . lovastatin (MEVACOR) 40 MG tablet      Take 1 tablet (40 mg total) by mouth nightly.      6   . ondansetron (ZOFRAN) 4 MG tablet      TAKE ONE TABLET BY MOUTH EVERY 4 HOURS AS NEEDED FOR NAUSEA      1   . pantoprazole (PROTONIX) 40 MG tablet   Oral   Take 40 mg by mouth daily.          . risperiDONE (RISPERDAL) 0.25 MG tablet   Oral   Take 0.25 mg by mouth at bedtime.          . temazepam (RESTORIL) 15 MG capsule      TAKE ONE CAPSULE BY MOUTH AT BEDTIME FOR INSOMNIA      1   . venlafaxine XR (EFFEXOR-XR) 75 MG 24 hr capsule   Oral   Take 225 mg by mouth daily.      1   . vitamin B-12 (CYANOCOBALAMIN) 1000 MCG tablet   Oral   Take 1,000 mcg by mouth daily.           Allergies Aspirin; Codeine; Codeine sulfate; Prednisone; and Valium  Family History  Problem Relation Age of Onset  . Heart Problems Mother   . Seizures Mother   . Stroke Mother   . Diverticulitis Mother   . Heart attack Father   . Heart attack Sister   . Pancreatitis Sister   . Cancer Sister   . Cancer Brother     Social History Social History  Substance Use Topics  . Smoking status: Former Research scientist (life sciences)  . Smokeless tobacco: Never Used     Comment: quit 1964  . Alcohol Use: No    Review of Systems Constitutional: No fever/chills Eyes: No visual changes. ENT: No sore throat. Cardiovascular: Denies chest pain. Respiratory: Denies shortness of breath. Gastrointestinal: No abdominal pain.  No nausea, no vomiting.  No diarrhea.  No constipation. Genitourinary: Negative for dysuria. Musculoskeletal: Negative for back pain. Skin: Negative for rash. Neurological: Negative for  headaches, focal weakness or numbness.  10-point ROS otherwise negative.  ____________________________________________   PHYSICAL EXAM:  VITAL SIGNS: ED Triage Vitals  Enc Vitals Group     BP 05/01/15 1634 121/64 mmHg     Pulse Rate 05/01/15 1634 65     Resp 05/01/15 1634 16     Temp --      Temp Source 05/01/15 1634 Oral     SpO2 05/01/15  1634 100 %     Weight --      Height --      Head Cir --      Peak Flow --      Pain Score 05/01/15 1636 7     Pain Loc --      Pain Edu? --      Excl. in [place]? --     Constitutional: Alert and oriented. Well appearing and in no acute distress. Eyes: Conjunctivae are normal. PERRL. EOMI. Head: Atraumatic. Nose: No congestion/rhinnorhea. Mouth/Throat: Mucous membranes are moist.  Oropharynx non-erythematous. Neck: No stridor.   Cardiovascular: Normal rate, regular rhythm. Grossly normal heart sounds.  Good peripheral circulation. Respiratory: Normal respiratory effort.  No retractions. Lungs CTAB. Gastrointestinal: Soft and nontender. No distention. No CVA tenderness. Genitourinary: deferred Musculoskeletal: No lower extremity tenderness nor edema.  No joint effusions. Neurologic:  Normal speech and language. No gross focal neurologic deficits are appreciated. 5 out of 5 strength in bilateral upper and lower extremities, sensation intact to light touch throughout, cranial nerves II through XII intact, normal finger-nose-finger. Skin:  Skin is warm, dry and intact. No rash noted. Psychiatric: Mood and affect are normal. Speech and behavior are normal.  ____________________________________________   LABS (all labs ordered are listed, but only abnormal results are displayed)  Labs Reviewed  CBC WITH DIFFERENTIAL/PLATELET - Abnormal; Notable for the following:    MCV 77.0 (*)    MCH 25.7 (*)    All other components within normal limits  COMPREHENSIVE METABOLIC PANEL - Abnormal; Notable for the following:    BUN 21 (*)    Creatinine,  Ser 1.34 (*)    GFR calc non Af Amer 37 (*)    GFR calc Af Amer 42 (*)    All other components within normal limits  URINALYSIS COMPLETEWITH MICROSCOPIC ([facility] ONLY) - Abnormal; Notable for the following:    Color, Urine STRAW (*)    APPearance CLEAR (*)    Specific Gravity, Urine 1.004 (*)    Hgb urine dipstick 1+ (*)    All other components within normal limits  TSH - Abnormal; Notable for the following:    TSH 14.604 (*)    All other components within normal limits  TROPONIN I  T4, FREE   ____________________________________________  EKG  ED ECG REPORT I, Joanne Gavel, the attending physician, personally viewed and interpreted this ECG.   Date: 05/01/2015  EKG Time: 16:44  Rate: 62  Rhythm: normal sinus rhythm  Axis: Normal  Intervals:none  ST&T Change: No acute ST elevation. Q waves in II, III, aVF are chronic.  ____________________________________________  RADIOLOGY  CXR IMPRESSION: 1. Hyperinflation and chronic interstitial changes. No acute disease.   ____________________________________________   PROCEDURES  Procedure(s) performed: None  Critical Care performed: No  ____________________________________________   INITIAL IMPRESSION / ASSESSMENT AND PLAN / ED COURSE  Pertinent labs & imaging results that were available during my care of the patient were reviewed by me and considered in my medical decision making (see chart for details).  Neema Keliyah Pavlovsky is a 81 y.o. female with history of coronary artery disease, hypothyroidism, COPD, bipolar disorder who presents for evaluation of 2 weeks generalized weakness and fatigue, intermittently feeling hyperactive, insomnia, lightheaded with position change, gradual onset, intermittent, currently mild. She believes these symptoms related to a new medication that she is taking called rexulti. On exam, she is very well-appearing and in no acute distress. Vital signs stable, she is afebrile she has a  benign  exam as well as an intact neurological examination. She ambulates normally and without assistance. She is currently asymptomatic. Clinical picture not consistent with ACS, stroke, there is no evidence of metabolic disturbance. Labs reviewed. Normal CBC, CMP notable for mild stable chronic creatinine elevation of 1.34. Troponin negative. Urinalysis is not consistent with urinary tract infection. TSH is elevated however normal T4. Chest x-ray clear. No indication for CT head. Discussed that she will not take anymore Rexulti tonight, she will follow-up with [facility], she will be seeing the doctor who is prescribing this new medication. She has an appointment to be seen there at 11 am. Discussed return precautions with the patient as well as her son at bedside and we discussed the discharge plan. All are comfortable with the discharge plan. DC home. ____________________________________________   FINAL CLINICAL IMPRESSION(S) / ED DIAGNOSES  Final diagnoses:  Positional lightheadedness  Adverse effects of medication, initial encounter      Joanne Gavel, MD 05/01/15 1942

## 2015-05-07 ENCOUNTER — Telehealth: Payer: Self-pay | Admitting: Urology

## 2015-05-07 NOTE — Telephone Encounter (Signed)
Patient said she can not come in and wants to know if you can call her with her RUS. She said she can not afford to come in.  [facility]

## 2015-05-08 ENCOUNTER — Ambulatory Visit: Payer: Self-pay | Admitting: Urology

## 2015-05-22 ENCOUNTER — Encounter: Payer: Self-pay | Admitting: Intensive Care

## 2015-05-22 ENCOUNTER — Emergency Department
Admission: EM | Admit: 2015-05-22 | Discharge: 2015-05-22 | Disposition: A | Discharge status: Home / Self Care | Payer: Medicare Other | Attending: Emergency Medicine | Admitting: Emergency Medicine

## 2015-05-22 ENCOUNTER — Encounter: Payer: Self-pay | Admitting: Emergency Medicine

## 2015-05-22 ENCOUNTER — Ambulatory Visit
Admission: EM | Admit: 2015-05-22 | Discharge: 2015-05-22 | Disposition: A | Discharge status: Hospice | Payer: Medicare Other | Attending: Family Medicine | Admitting: Family Medicine

## 2015-05-22 DIAGNOSIS — J3489 Other specified disorders of nose and nasal sinuses: Secondary | ICD-10-CM | POA: Diagnosis not present

## 2015-05-22 DIAGNOSIS — J0100 Acute maxillary sinusitis, unspecified: Secondary | ICD-10-CM | POA: Diagnosis not present

## 2015-05-22 DIAGNOSIS — R42 Dizziness and giddiness: Secondary | ICD-10-CM | POA: Insufficient documentation

## 2015-05-22 DIAGNOSIS — Z79899 Other long term (current) drug therapy: Secondary | ICD-10-CM | POA: Diagnosis not present

## 2015-05-22 DIAGNOSIS — I129 Hypertensive chronic kidney disease with stage 1 through stage 4 chronic kidney disease, or unspecified chronic kidney disease: Secondary | ICD-10-CM | POA: Diagnosis not present

## 2015-05-22 DIAGNOSIS — N184 Chronic kidney disease, stage 4 (severe): Secondary | ICD-10-CM | POA: Diagnosis not present

## 2015-05-22 DIAGNOSIS — J069 Acute upper respiratory infection, unspecified: Secondary | ICD-10-CM

## 2015-05-22 DIAGNOSIS — R05 Cough: Secondary | ICD-10-CM | POA: Diagnosis not present

## 2015-05-22 DIAGNOSIS — R0981 Nasal congestion: Secondary | ICD-10-CM | POA: Diagnosis not present

## 2015-05-22 DIAGNOSIS — Z87891 Personal history of nicotine dependence: Secondary | ICD-10-CM | POA: Insufficient documentation

## 2015-05-22 LAB — URINALYSIS COMPLETE WITH MICROSCOPIC (ARMC ONLY)
BILIRUBIN URINE: NEGATIVE
Bacteria, UA: NONE SEEN
Glucose, UA: NEGATIVE mg/dL
KETONES UR: NEGATIVE mg/dL
NITRITE: NEGATIVE
PH: 6 (ref 5.0–8.0)
Protein, ur: NEGATIVE mg/dL
SPECIFIC GRAVITY, URINE: 1.009 (ref 1.005–1.030)
Squamous Epithelial / LPF: NONE SEEN

## 2015-05-22 LAB — CBC WITH DIFFERENTIAL/PLATELET
BASOS ABS: 0.1 10*3/uL (ref 0–0.1)
BASOS PCT: 1 %
EOS ABS: 0.1 10*3/uL (ref 0–0.7)
EOS PCT: 1 %
HCT: 35.5 % (ref 35.0–47.0)
HEMOGLOBIN: 11.8 g/dL — AB (ref 12.0–16.0)
Lymphocytes Relative: 15 %
Lymphs Abs: 1.3 10*3/uL (ref 1.0–3.6)
MCH: 25.6 pg — AB (ref 26.0–34.0)
MCHC: 33.2 g/dL (ref 32.0–36.0)
MCV: 77.1 fL — ABNORMAL LOW (ref 80.0–100.0)
Monocytes Absolute: 1.4 10*3/uL — ABNORMAL HIGH (ref 0.2–0.9)
Monocytes Relative: 15 %
NEUTROS PCT: 68 %
Neutro Abs: 6.3 10*3/uL (ref 1.4–6.5)
PLATELETS: 158 10*3/uL (ref 150–440)
RBC: 4.61 MIL/uL (ref 3.80–5.20)
RDW: 14.6 % — ABNORMAL HIGH (ref 11.5–14.5)
WBC: 9.2 10*3/uL (ref 3.6–11.0)

## 2015-05-22 LAB — BASIC METABOLIC PANEL
ANION GAP: 7 (ref 5–15)
BUN: 19 mg/dL (ref 6–20)
CO2: 25 mmol/L (ref 22–32)
Calcium: 9 mg/dL (ref 8.9–10.3)
Chloride: 106 mmol/L (ref 101–111)
Creatinine, Ser: 1.47 mg/dL — ABNORMAL HIGH (ref 0.44–1.00)
GFR, EST AFRICAN AMERICAN: 38 mL/min — AB (ref >60)
GFR, EST NON AFRICAN AMERICAN: 33 mL/min — AB (ref >60)
Glucose, Bld: 144 mg/dL — ABNORMAL HIGH (ref 65–99)
POTASSIUM: 3.6 mmol/L (ref 3.5–5.1)
SODIUM: 138 mmol/L (ref 135–145)

## 2015-05-22 MED ORDER — MECLIZINE HCL 25 MG PO TABS: 25.0000 mg | ORAL_TABLET | Freq: Three times a day (TID) | ORAL | Status: DC | PRN

## 2015-05-22 NOTE — ED Notes (Addendum)
Patient c/o cough, chest congestion, runny nose, and sinus pain and pressure and pain in her ears for over a week but worse over the past 3 days.  Patient also reports ongoing pain off and on on her right lower abdomen pain around her right hip.

## 2015-05-22 NOTE — Discharge Instructions (Signed)
Please seek medical attention for any high fevers, chest pain, shortness of breath, change in behavior, persistent vomiting, bloody stool or any other new or concerning symptoms. ° ° °Dizziness °Dizziness is a common problem. It is a feeling of unsteadiness or light-headedness. You may feel like you are about to faint. Dizziness can lead to injury if you stumble or fall. Anyone can become dizzy, but dizziness is more common in older adults. This condition can be caused by a number of things, including medicines, dehydration, or illness. °HOME CARE INSTRUCTIONS °Taking these steps may help with your condition: °Eating and Drinking °· Drink enough fluid to keep your urine clear or pale yellow. This helps to keep you from becoming dehydrated. Try to drink more clear fluids, such as water. °· Do not drink alcohol. °· Limit your caffeine intake if directed by your health care provider. °· Limit your salt intake if directed by your health care provider. °Activity °· Avoid making quick movements. °¨ Rise slowly from chairs and steady yourself until you feel okay. °¨ In the morning, first sit up on the side of the bed. When you feel okay, stand slowly while you hold onto something until you know that your balance is fine. °· Move your legs often if you need to stand in one place for a long time. Tighten and relax your muscles in your legs while you are standing. °· Do not drive or operate heavy machinery if you feel dizzy. °· Avoid bending down if you feel dizzy. Place items in your home so that they are easy for you to reach without leaning over. °Lifestyle °· Do not use any tobacco products, including cigarettes, chewing tobacco, or electronic cigarettes. If you need help quitting, ask your health care provider. °· Try to reduce your stress level, such as with yoga or meditation. Talk with your health care provider if you need help. °General Instructions °· Watch your dizziness for any changes. °· Take medicines only as  directed by your health care provider. Talk with your health care provider if you think that your dizziness is caused by a medicine that you are taking. °· Tell a friend or a family member that you are feeling dizzy. If he or she notices any changes in your behavior, have this person call your health care provider. °· Keep all follow-up visits as directed by your health care provider. This is important. °SEEK MEDICAL CARE IF: °· Your dizziness does not go away. °· Your dizziness or light-headedness gets worse. °· You feel nauseous. °· You have reduced hearing. °· You have new symptoms. °· You are unsteady on your feet or you feel like the room is spinning. °SEEK IMMEDIATE MEDICAL CARE IF: °· You vomit or have diarrhea and are unable to eat or drink anything. °· You have problems talking, walking, swallowing, or using your arms, hands, or legs. °· You feel generally weak. °· You are not thinking clearly or you have trouble forming sentences. It may take a friend or family member to notice this. °· You have chest pain, abdominal pain, shortness of breath, or sweating. °· Your vision changes. °· You notice any bleeding. °· You have a headache. °· You have neck pain or a stiff neck. °· You have a fever. °  °This information is not intended to replace advice given to you by your health care provider. Make sure you discuss any questions you have with your health care provider. °  °Document Released: 10/29/2000 Document Revised: 09/19/2014   Document Reviewed: 05/01/2014 °Elsevier Interactive Patient Education ©2016 Elsevier Inc[employer] ° °

## 2015-05-22 NOTE — Discharge Instructions (Signed)
Dizziness Dizziness is a common problem. It is a feeling of unsteadiness or light-headedness. You may feel like you are about to faint. Dizziness can lead to injury if you stumble or fall. Anyone can become dizzy, but dizziness is more common in older adults. This condition can be caused by a number of things, including medicines, dehydration, or illness. HOME CARE INSTRUCTIONS Taking these steps may help with your condition: Eating and Drinking  Drink enough fluid to keep your urine clear or pale yellow. This helps to keep you from becoming dehydrated. Try to drink more clear fluids, such as water.  Do not drink alcohol.  Limit your caffeine intake if directed by your health care provider.  Limit your salt intake if directed by your health care provider. Activity  Avoid making quick movements.  Rise slowly from chairs and steady yourself until you feel okay.  In the morning, first sit up on the side of the bed. When you feel okay, stand slowly while you hold onto something until you know that your balance is fine.  Move your legs often if you need to stand in one place for a long time. Tighten and relax your muscles in your legs while you are standing.  Do not drive or operate heavy machinery if you feel dizzy.  Avoid bending down if you feel dizzy. Place items in your home so that they are easy for you to reach without leaning over. Lifestyle  Do not use any tobacco products, including cigarettes, chewing tobacco, or electronic cigarettes. If you need help quitting, ask your health care provider.  Try to reduce your stress level, such as with yoga or meditation. Talk with your health care provider if you need help. General Instructions  Watch your dizziness for any changes.  Take medicines only as directed by your health care provider. Talk with your health care provider if you think that your dizziness is caused by a medicine that you are taking.  Tell a friend or a family  member that you are feeling dizzy. If he or she notices any changes in your behavior, have this person call your health care provider.  Keep all follow-up visits as directed by your health care provider. This is important. SEEK MEDICAL CARE IF:  Your dizziness does not go away.  Your dizziness or light-headedness gets worse.  You feel nauseous.  You have reduced hearing.  You have new symptoms.  You are unsteady on your feet or you feel like the room is spinning. SEEK IMMEDIATE MEDICAL CARE IF:  You vomit or have diarrhea and are unable to eat or drink anything.  You have problems talking, walking, swallowing, or using your arms, hands, or legs.  You feel generally weak.  You are not thinking clearly or you have trouble forming sentences. It may take a friend or family member to notice this.  You have chest pain, abdominal pain, shortness of breath, or sweating.  Your vision changes.  You notice any bleeding.  You have a headache.  You have neck pain or a stiff neck.  You have a fever.   This information is not intended to replace advice given to you by your health care provider. Make sure you discuss any questions you have with your health care provider.   Document Released: 10/29/2000 Document Revised: 09/19/2014 Document Reviewed: 05/01/2014 Elsevier Interactive Patient Education 2016 Elsevier Inc.  Sinusitis, Adult Sinusitis is redness, soreness, and puffiness (inflammation) of the air pockets in the bones of your  face (sinuses). The redness, soreness, and puffiness can cause air and mucus to get trapped in your sinuses. This can allow germs to grow and cause an infection.  HOME CARE   Drink enough fluids to keep your pee (urine) clear or pale yellow.  Use a humidifier in your home.  Run a hot shower to create steam in the bathroom. Sit in the bathroom with the door closed. Breathe in the steam 3-4 times a day.  Put a warm, moist washcloth on your face 3-4  times a day, or as told by your doctor.  Use salt water sprays (saline sprays) to wet the thick fluid in your nose. This can help the sinuses drain.  Only take medicine as told by your doctor. GET HELP RIGHT AWAY IF:   Your pain gets worse.  You have very bad headaches.  You are sick to your stomach (nauseous).  You throw up (vomit).  You are very sleepy (drowsy) all the time.  Your face is puffy (swollen).  Your vision changes.  You have a stiff neck.  You have trouble breathing. MAKE SURE YOU:   Understand these instructions.  Will watch your condition.  Will get help right away if you are not doing well or get worse.   This information is not intended to replace advice given to you by your health care provider. Make sure you discuss any questions you have with your health care provider.   Document Released: 10/22/2007 Document Revised: 05/26/2014 Document Reviewed: 12/09/2011 Elsevier Interactive Patient Education [employer].

## 2015-05-22 NOTE — ED Provider Notes (Signed)
[facility] Emergency Department Provider Note   ____________________________________________  Time seen: 1920  I have reviewed the triage vital signs and the nursing notes.   HISTORY  Chief Complaint Dizziness  History limited by: Not Limited   HPI Stephanie Anthony is a 82 y.o. female with history of multiple chronic conditions who presents to the emergency department today from urgent care because of concerns for dizziness. Patient states the symptoms started last month. She states that he started when she was taken off of some of her psychiatric medications. She describes it as a sense of the room spinning around her. She states it is worse when she bends over and stands up straight. It does go away on its own. The patient denies any severe headache. She does have some concerns for some recent nasal congestion, sinus pain and cough.     Past Medical History  Diagnosis Date  . Bipolar 1 disorder ([place])   . Seasonal allergies   . Murmur, cardiac   . Coronary artery disease   . Chronic mitral valve disease   . Hypertension   . Chronic kidney disease     stage 3  . Deviated nasal septum   . Renal cyst, left     bilateral  . 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 Chronic kidney disease     Patient Active Problem List   Diagnosis Date Noted  . Severe bipolar I disorder with depression ([place]) 11/16/2014  . Enteritis 10/17/2014  . Gastroenteritis, non-infectious 10/17/2014  . Chronic kidney disease (CKD), stage III (moderate) 09/27/2014  . 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  . H/O nutritional disorder 09/27/2014  . HLD (hyperlipidemia) 09/27/2014  . Heart valve incompetence 09/27/2014  . Mitral and aortic incompetence 09/27/2014  . Personal history of other endocrine, nutritional and metabolic disease 123XX123  . Hypothyroidism, postop 09/27/2014  . Bipolar disorder, current episode depressed, severe, without  psychotic features ([place])   . Bipolar affective disorder, current episode depressed ([place]) 09/25/2014  . Bipolar I disorder, most recent episode depressed ([place]) 09/25/2014  . Chronic kidney disease, stage IV (severe) ([place]) 09/19/2014  . Pure hypercholesterolemia 09/19/2014  . Acquired hypothyroidism 03/29/2014  . Bipolar affective disorder ([place]) 03/29/2014  . Bradycardia 03/29/2014  . Essential (primary) hypertension 03/29/2014  . MI (mitral incompetence) 03/29/2014  . Acquired cyst of kidney 06/18/2012    Past Surgical History  Procedure Laterality Date  . Thyroidectomy    . Cholecystectomy    . Esophagogastroduodenoscopy (egd) with propofol N/A 10/20/2014    Procedure: ESOPHAGOGASTRODUODENOSCOPY (EGD) WITH PROPOFOL;  Surgeon: [name], MD;  Location: [facility] ENDOSCOPY;  Service: Endoscopy;  Laterality: N/A;  . Cardiac catheterization    . Knee replacement, right      Current Outpatient Rx  Name  Route  Sig  Dispense  Refill  . ALPRAZolam (XANAX) 0.25 MG tablet   Oral   Take 1 tablet by mouth at bedtime as needed.         . ARIPiprazole (ABILIFY) 2 MG tablet   Oral   Take 2 mg by mouth daily.         [name] buPROPion (WELLBUTRIN SR) 100 MG 12 hr tablet   Oral   Take 100 mg by mouth 2 (two) times daily.          . Calcium Carbonate Antacid 400 MG CHEW   Oral   Chew by mouth.         . cholecalciferol (VITAMIN D) 1000  UNITS tablet   Oral   Take 1 tablet (1,000 Units total) by mouth daily.   30 tablet   0   . enalapril (VASOTEC) 2.5 MG tablet   Oral   Take 2.5 mg by mouth daily.          . furosemide (LASIX) 20 MG tablet   Oral   Take by mouth.         [name] HYDROcodone-acetaminophen (NORCO/VICODIN) 5-325 MG per tablet   Oral   Take by mouth.         . lamoTRIgine (LAMICTAL) 100 MG tablet   Oral   Take 1 tablet by mouth at bedtime.          [name] latanoprost (XALATAN) 0.005 % ophthalmic solution   Both Eyes   Place 1 drop into both eyes at  bedtime.         [name] levothyroxine (SYNTHROID, LEVOTHROID) 100 MCG tablet   Oral   Take 100 mcg by mouth daily before breakfast.          . lovastatin (MEVACOR) 40 MG tablet      Take 1 tablet (40 mg total) by mouth nightly.      6   . ondansetron (ZOFRAN) 4 MG tablet      TAKE ONE TABLET BY MOUTH EVERY 4 HOURS AS NEEDED FOR NAUSEA      1   . Oxcarbazepine (TRILEPTAL) 300 MG tablet   Oral   Take 300 mg by mouth daily.         . pantoprazole (PROTONIX) 40 MG tablet   Oral   Take 40 mg by mouth daily.          . risperiDONE (RISPERDAL) 0.25 MG tablet   Oral   Take 0.25 mg by mouth at bedtime.          . temazepam (RESTORIL) 15 MG capsule      TAKE ONE CAPSULE BY MOUTH AT BEDTIME FOR INSOMNIA      1   . venlafaxine XR (EFFEXOR-XR) 75 MG 24 hr capsule   Oral   Take 225 mg by mouth daily.      1   . vitamin B-12 (CYANOCOBALAMIN) 1000 MCG tablet   Oral   Take 1,000 mcg by mouth daily.           Allergies Aspirin; Codeine; Codeine sulfate; Prednisone; and Valium  Family History  Problem Relation Age of Onset  . Heart Problems Mother   . Seizures Mother   . Stroke Mother   . Diverticulitis Mother   . Heart attack Father   . Heart attack Sister   . Pancreatitis Sister   . Cancer Sister   . Cancer Brother     Social History Social History  Substance Use Topics  . Smoking status: Former Research scientist (life sciences)  . Smokeless tobacco: Never Used     Comment: quit 1964  . Alcohol Use: No    Review of Systems  Constitutional: Negative for fever. Cardiovascular: Negative for chest pain. Respiratory: Negative for shortness of breath. Gastrointestinal: Negative for abdominal pain, vomiting and diarrhea. Neurological: Negative for headaches, focal weakness or numbness.  10-point ROS otherwise negative.  ____________________________________________   PHYSICAL EXAM:  VITAL SIGNS: ED Triage Vitals  Enc Vitals Group     BP 05/22/15 1750 124/57 mmHg      Pulse Rate 05/22/15 1750 82     Resp 05/22/15 1752 11     Temp 05/22/15 1750 99.4 F (37.4 C)  Temp Source 05/22/15 1750 Oral     SpO2 05/22/15 1750 96 %     Weight 05/22/15 1750 172 lb 6.4 oz (78.2 kg)     Height --      Head Cir --      Peak Flow --      Pain Score 05/22/15 1755 6   Constitutional: Alert and oriented. Well appearing and in no distress. Eyes: Conjunctivae are normal. PERRL. Normal extraocular movements. ENT   Head: Normocephalic and atraumatic.   Nose: No congestion/rhinnorhea.   Mouth/Throat: Mucous membranes are moist.   Neck: No stridor. Hematological/Lymphatic/Immunilogical: No cervical lymphadenopathy. Cardiovascular: Normal rate, regular rhythm.  No murmurs, rubs, or gallops. Respiratory: Normal respiratory effort without tachypnea nor retractions. Breath sounds are clear and equal bilaterally. No wheezes/rales/rhonchi. Gastrointestinal: Soft and nontender. No distention. There is no CVA tenderness. Genitourinary: Deferred Musculoskeletal: Normal range of motion in all extremities. No joint effusions.  No lower extremity tenderness nor edema. Neurologic:  Normal speech and language. No gross focal neurologic deficits are appreciated.  Skin:  Skin is warm, dry and intact. No rash noted. Psychiatric: Mood and affect are normal. Speech and behavior are normal. Patient exhibits appropriate insight and judgment.  ____________________________________________    LABS (pertinent positives/negatives)  Labs Reviewed  CBC WITH DIFFERENTIAL/PLATELET - Abnormal; Notable for the following:    Hemoglobin 11.8 (*)    MCV 77.1 (*)    MCH 25.6 (*)    RDW 14.6 (*)    Monocytes Absolute 1.4 (*)    All other components within normal limits  BASIC METABOLIC PANEL - Abnormal; Notable for the following:    Glucose, Bld 144 (*)    Creatinine, Ser 1.47 (*)    GFR calc non Af Amer 33 (*)    GFR calc Af Amer 38 (*)    All other components within normal limits   URINALYSIS COMPLETEWITH MICROSCOPIC ([facility] ONLY) - Abnormal; Notable for the following:    Color, Urine YELLOW (*)    APPearance CLEAR (*)    Hgb urine dipstick 1+ (*)    Leukocytes, UA TRACE (*)    All other components within normal limits     ____________________________________________   EKG  I, Nance Pear, attending physician, personally viewed and interpreted this EKG  EKG Time: 1754 Rate: 87 Rhythm: normal sinus rhythm Axis: normal Intervals: qtc 443 QRS: narrow ST changes: no st elevation Impression: normal ekg   ____________________________________________    RADIOLOGY  None   ____________________________________________   PROCEDURES  Procedure(s) performed: None  Critical Care performed: No  ____________________________________________   INITIAL IMPRESSION / ASSESSMENT AND PLAN / ED COURSE  Pertinent labs & imaging results that were available during my care of the patient were reviewed by me and considered in my medical decision making (see chart for details).  Patient presented to the emergency department today because of concerns for some dizziness. Clinical history is consistent with BPPV, patient states it is worse bending over to some sense of the room spinning around her. Blood work here without any concerning findings. Urine without any concerning findings. Will plan on discharging home with Antivert.  ____________________________________________   FINAL CLINICAL IMPRESSION(S) / ED DIAGNOSES  Final diagnoses:  Vertigo     Nance Pear, MD 05/22/15 2144

## 2015-05-22 NOTE — ED Provider Notes (Signed)
[facility]  ____________________________________________  Time seen: Approximately 4:51 PM  I have reviewed the triage vital signs and the nursing notes.   HISTORY  Chief Complaint Cough; Nasal Congestion; and Facial Pain   HPI Stephanie Anthony is a 82 y.o. female with multiple chronic conditions including chronic kidney disease, bipolar, coronary artery disease, who presents today for complaints of several days of nasal congestion, nasal drainage, cough, fevers as well as dizziness. Patient reports that she has had some nasal congestion for approximately 3-4 days. Patient however reports that she has had some intermittent dizziness over the last month and states she felt like it was from a chemical imbalance from being off some of her psychotic medications. States her psychiatrist took her off lithium fairly recently.  Patient states however the last 3-4 days her dizziness has quickly increased. Patient states that she feels that when she is walking in a straight line she is bouncing off the walls because she cannot walk straight due to the dizziness. Denies recent fall or head injury. Patient reports that she has felt like she has had fevers at home in the last few days. Patient states the dizziness has become more persistent. Patient states that the dizziness is not only with movement and walking, but also at rest.   Patient also reports some intermittent right lower abdominal pain that radiates into her hip. Patient states that she has been told in the past that she has an enlarged appendix. Also reports that she feels like her urine is darker than normal. Patient states "I just feel horrible ".Denies current abdominal pain. Patient also reports that she has been having some pain intermittently in her lower abdomen and into her right hip. Patient reports that this pain is in there for several months which intermittently increases and decreases. Denies a known associated triggers.    Patient reports that she lives at home alone. States her sister moved out 2 weeks ago.   Patient also reports that in the last 6 months she has been hospitalized twice in the behavioral unit. states hospitalization was primarily due to depression and thoughts of hurting herself. Patient denies current suicidal or homicidal ideations.  PCP: Dr. Richarda Overlie [facility]iatry: [facility]      Past Medical History  Diagnosis Date  . Bipolar 1 disorder ([place])   . Seasonal allergies   . Murmur, cardiac   . Coronary artery disease   . Chronic mitral valve disease   . Hypertension   . Chronic kidney disease     stage 3  . Deviated nasal septum   . Renal cyst, left     bilateral  . 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 Chronic kidney disease     Patient Active Problem List   Diagnosis Date Noted  . Severe bipolar I disorder with depression ([place]) 11/16/2014  . Enteritis 10/17/2014  . Gastroenteritis, non-infectious 10/17/2014  . Chronic kidney disease (CKD), stage III (moderate) 09/27/2014  . 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  . H/O nutritional disorder 09/27/2014  . HLD (hyperlipidemia) 09/27/2014  . Heart valve incompetence 09/27/2014  . Mitral and aortic incompetence 09/27/2014  . Personal history of other endocrine, nutritional and metabolic disease 123XX123  . Hypothyroidism, postop 09/27/2014  . Bipolar disorder, current episode depressed, severe, without psychotic features ([place])   . Bipolar affective disorder, current episode depressed ([place]) 09/25/2014  . Bipolar I disorder, most recent episode depressed ([place]) 09/25/2014  . Chronic kidney disease, stage IV (severe) ([place])  09/19/2014  . Pure hypercholesterolemia 09/19/2014  . Acquired hypothyroidism 03/29/2014  . Bipolar affective disorder ([place]) 03/29/2014  . Bradycardia 03/29/2014  . Essential (primary) hypertension 03/29/2014  . MI (mitral incompetence) 03/29/2014  . Acquired  cyst of kidney 06/18/2012    Past Surgical History  Procedure Laterality Date  . Thyroidectomy    . Cholecystectomy    . Esophagogastroduodenoscopy (egd) with propofol N/A 10/20/2014    Procedure: ESOPHAGOGASTRODUODENOSCOPY (EGD) WITH PROPOFOL;  Surgeon: [name], MD;  Location: [facility] ENDOSCOPY;  Service: Endoscopy;  Laterality: N/A;  . Cardiac catheterization    . Knee replacement, right      Current Outpatient Rx  Name  Route  Sig  Dispense  Refill  . ARIPiprazole (ABILIFY) 2 MG tablet   Oral   Take 2 mg by mouth daily.         . Calcium Carbonate Antacid 400 MG CHEW   Oral   Chew by mouth.         . Oxcarbazepine (TRILEPTAL) 300 MG tablet   Oral   Take 300 mg by mouth daily.         [name] ALPRAZolam (XANAX) 0.25 MG tablet   Oral   Take 1 tablet by mouth at bedtime as needed.         [name] buPROPion (WELLBUTRIN SR) 100 MG 12 hr tablet   Oral   Take 100 mg by mouth 2 (two) times daily.          . cholecalciferol (VITAMIN D) 1000 UNITS tablet   Oral   Take 1 tablet (1,000 Units total) by mouth daily.   30 tablet   0   . enalapril (VASOTEC) 2.5 MG tablet   Oral   Take 2.5 mg by mouth daily.          . furosemide (LASIX) 20 MG tablet   Oral   Take by mouth.         [name] HYDROcodone-acetaminophen (NORCO/VICODIN) 5-325 MG per tablet   Oral   Take by mouth.         . lamoTRIgine (LAMICTAL) 100 MG tablet   Oral   Take 1 tablet by mouth at bedtime.          [name] latanoprost (XALATAN) 0.005 % ophthalmic solution   Both Eyes   Place 1 drop into both eyes at bedtime.         [name] levothyroxine (SYNTHROID, LEVOTHROID) 100 MCG tablet   Oral   Take 100 mcg by mouth daily before breakfast.          . lovastatin (MEVACOR) 40 MG tablet      Take 1 tablet (40 mg total) by mouth nightly.      6   . ondansetron (ZOFRAN) 4 MG tablet      TAKE ONE TABLET BY MOUTH EVERY 4 HOURS AS NEEDED FOR NAUSEA      1   . pantoprazole (PROTONIX) 40 MG tablet    Oral   Take 40 mg by mouth daily.          . risperiDONE (RISPERDAL) 0.25 MG tablet   Oral   Take 0.25 mg by mouth at bedtime.          . temazepam (RESTORIL) 15 MG capsule      TAKE ONE CAPSULE BY MOUTH AT BEDTIME FOR INSOMNIA      1   . venlafaxine XR (EFFEXOR-XR) 75 MG 24 hr capsule   Oral   Take 225  mg by mouth daily.      1   . vitamin B-12 (CYANOCOBALAMIN) 1000 MCG tablet   Oral   Take 1,000 mcg by mouth daily.           Allergies Aspirin; Codeine; Codeine sulfate; Prednisone; and Valium  Family History  Problem Relation Age of Onset  . Heart Problems Mother   . Seizures Mother   . Stroke Mother   . Diverticulitis Mother   . Heart attack Father   . Heart attack Sister   . Pancreatitis Sister   . Cancer Sister   . Cancer Brother     Social History Social History  Substance Use Topics  . Smoking status: Former Research scientist (life sciences)  . Smokeless tobacco: Never Used     Comment: quit 1964  . Alcohol Use: No    Review of Systems Constitutional: Positive warm/chills and fevers. Eyes: No visual changes. ENT: No sore throat. positive runny nose, nasal congestion, sinus drainage and cough.  Cardiovascular: Denies chest pain. Respiratory: Denies shortness of breath. Gastrointestinal: positive intermittent abdominal pain.  No nausea, no vomiting.  No diarrhea.  No constipation. Genitourinary: Negative for dysuria. Musculoskeletal: Negative for back pain. Skin: Negative for rash. Neurological: Negative for headaches, focal weakness or numbness.positive dizziness as above.   10-point ROS otherwise negative.  ____________________________________________   PHYSICAL EXAM:  VITAL SIGNS: ED Triage Vitals  Enc Vitals Group     BP 05/22/15 1547 121/54 mmHg     Pulse Rate 05/22/15 1547 77     Resp 05/22/15 1547 16     Temp 05/22/15 1547 99.7 F (37.6 C)     Temp Source 05/22/15 1547 Tympanic     SpO2 05/22/15 1547 99 %     Weight 05/22/15 1547 164 lb (74.39 kg)      Height 05/22/15 1547 5\' 7"  (1.702 m)     Head Cir --      Peak Flow --      Pain Score 05/22/15 1554 5     Pain Loc --      Pain Edu? --      Excl. in [place]? --     Constitutional: Alert and oriented. Well appearing and in no acute distress. Eyes: Conjunctivae are normal. PERRL. EOMI. Head: Atraumatic. mild tenderness to palpation bilateral frontal and maxillary sinuses. No swelling. No erythema.   Ears: no erythema, normal TMs bilaterally.   Nosemild nasal congestion with clear rhinorrhea.  Mouth/Throat: Mucous membranes are moist.  Oropharynx non-erythematous. no tonsillar swelling or exudate. Neck: No stridor.  No cervical spine tenderness to palpation. Hematological/Lymphatic/Immunilogical: No cervical lymphadenopathy. Cardiovascular: Normal rate, regular rhythm. Grossly normal heart sounds.  Good peripheral circulation. Respiratory: Normal respiratory effort.  No retractions. Lungs CTAB. no wheezes, rales or rhonchi. Good air movement.  Gastrointestinal: Mild suprapubic and periumbilical tenderness to palpation, otherwise abdomen soft and nontender. No distention. Normal Bowel sounds.  No abdominal bruits. No CVA tenderness. Musculoskeletal: No lower or upper extremity tenderness. Mild nonpitting bilateral lower extremity edema. Bilateral pedal pulses equal and easily palpated.  Neurologic:  Normal speech and language.   Cranial nerves 2-12 grossly intact, except slight ataxia with ambulation. 5 out of 5 strength to bilateral upper and lower extremities. Sensation intact bilateral upper and lower extremities. Skin:  Skin is warm, dry and intact. No rash noted. Psychiatric: Mood and affect are normal. Speech and behavior are normal.  ____________________________________________   LABS (all labs ordered are listed, but only abnormal results are displayed)  Labs  Reviewed - No data to display ____________________________________________  EKG  ED ECG REPORT I, Marylene Land,  and Dr Alveta Heimlich personally viewed and interpreted this ECG.   Date: 05/22/2015  EKG Time: 1655  Rate: 83  Rhythm: normal EKG, normal sinus rhythm,normal sinus rhythm with sinus arrhythmia.  Axis: none  ST&T Change: none  ____________________________________________  INITIAL IMPRESSION / ASSESSMENT AND PLAN / ED COURSE  Pertinent labs & imaging results that were available during my care of the patient were reviewed by me and considered in my medical decision making (see chart for details).  Alert and oriented patient. Presents for the complaints of multiple medical complaints. Patient with multiple chronic comorbidities. Patient reports coming in for few days of runny nose, nasal congestion and cough with intermittent fever. However patient also reports acutely increased dizziness over the last few days with feeling off balanced. Patient also reports some intermittent abdominal pain. Patient lives at home alone. Patient frequently expressing the dizziness concerns her during exam.  Suspect patient has sinusitis. However patient with reports of acutely increased dizziness as well as abdominal pain with reports of fevers at home, as well as multiple comorbidities recommended for patient to be further evaluated in emergency room of her choice due to dizziness. Patient verbalized understanding and agreed to this plan. Patient reports a friend drove her to the the urgent care but reports her friend would not be able to drive her to the emergency room. Patient states that she does not have anyone else that could transport her. As patient does not have other means of transportation as well as acute increase in dizziness, will call EMS for patient transportation to emergency room. Discussed this patient and plan of care with Dr Alveta Heimlich who agrees with plan. Patient stable at the time of transfer.  Discussed follow up with Primary care physician this week. Discussed follow up and return parameters including no  resolution or any worsening concerns. Patient verbalized understanding and agreed to plan.   ____________________________________________   FINAL CLINICAL IMPRESSION(S) / ED DIAGNOSES  Final diagnoses:  Dizziness  Upper respiratory infection  Acute maxillary sinusitis, recurrence not specified       Marylene Land, NP 05/22/15 1727

## 2015-05-22 NOTE — ED Notes (Signed)
Patient is waiting in lobby for Cab. Patient tried to call multiple people in her address book and no one answered. Patient did not have number for friend she states dropped her off. Patient reports baseline is having dizziness and takes her time moving around due to dizziness. Patient states she does not use walkers, wheelchairs or canes at home to ambulate. Patient educated on falls and ambulating safely. Patient instructed to ask for help as needed when getting into cab. RN made aware out front of patient waiting on cab and cab driver has been instructed to come inside and let first nurse or officer know they are here to take patient home.

## 2015-05-29 NOTE — Telephone Encounter (Signed)
Renal ultrasound reviewed with the patient. No renal cyst of particular concern or size. No further follow-up renal ultrasounds recommended given her age and comorbidities and lack of concerning features.  Hollice Espy, MD

## 2015-06-22 DIAGNOSIS — Z96651 Presence of right artificial knee joint: Secondary | ICD-10-CM | POA: Insufficient documentation

## 2015-07-07 ENCOUNTER — Emergency Department
Admission: EM | Admit: 2015-07-07 | Discharge: 2015-07-09 | Disposition: A | Discharge status: Other Acute Inpatient Hospital | Payer: Medicare Other | Attending: Emergency Medicine | Admitting: Emergency Medicine

## 2015-07-07 ENCOUNTER — Encounter: Payer: Self-pay | Admitting: Emergency Medicine

## 2015-07-07 DIAGNOSIS — I10 Essential (primary) hypertension: Secondary | ICD-10-CM | POA: Diagnosis present

## 2015-07-07 DIAGNOSIS — R4689 Other symptoms and signs involving appearance and behavior: Secondary | ICD-10-CM

## 2015-07-07 DIAGNOSIS — E039 Hypothyroidism, unspecified: Secondary | ICD-10-CM | POA: Diagnosis present

## 2015-07-07 DIAGNOSIS — N183 Chronic kidney disease, stage 3 unspecified: Secondary | ICD-10-CM | POA: Diagnosis present

## 2015-07-07 DIAGNOSIS — I129 Hypertensive chronic kidney disease with stage 1 through stage 4 chronic kidney disease, or unspecified chronic kidney disease: Secondary | ICD-10-CM | POA: Diagnosis not present

## 2015-07-07 DIAGNOSIS — R630 Anorexia: Secondary | ICD-10-CM

## 2015-07-07 DIAGNOSIS — R45851 Suicidal ideations: Secondary | ICD-10-CM | POA: Diagnosis present

## 2015-07-07 DIAGNOSIS — F919 Conduct disorder, unspecified: Secondary | ICD-10-CM | POA: Insufficient documentation

## 2015-07-07 DIAGNOSIS — Z87891 Personal history of nicotine dependence: Secondary | ICD-10-CM | POA: Insufficient documentation

## 2015-07-07 DIAGNOSIS — F314 Bipolar disorder, current episode depressed, severe, without psychotic features: Secondary | ICD-10-CM | POA: Diagnosis not present

## 2015-07-07 DIAGNOSIS — Z79899 Other long term (current) drug therapy: Secondary | ICD-10-CM | POA: Diagnosis not present

## 2015-07-07 DIAGNOSIS — F3130 Bipolar disorder, current episode depressed, mild or moderate severity, unspecified: Secondary | ICD-10-CM | POA: Diagnosis present

## 2015-07-07 LAB — URINE DRUG SCREEN, QUALITATIVE (ARMC ONLY)
Amphetamines, Ur Screen: NOT DETECTED
BARBITURATES, UR SCREEN: NOT DETECTED
BENZODIAZEPINE, UR SCRN: NOT DETECTED
CANNABINOID 50 NG, UR ~~LOC~~: NOT DETECTED
COCAINE METABOLITE, UR ~~LOC~~: NOT DETECTED
MDMA (Ecstasy)Ur Screen: NOT DETECTED
METHADONE SCREEN, URINE: NOT DETECTED
Opiate, Ur Screen: NOT DETECTED
Phencyclidine (PCP) Ur S: NOT DETECTED
TRICYCLIC, UR SCREEN: NOT DETECTED

## 2015-07-07 LAB — COMPREHENSIVE METABOLIC PANEL
ALT: 11 U/L — ABNORMAL LOW (ref 14–54)
ANION GAP: 13 (ref 5–15)
AST: 18 U/L (ref 15–41)
Albumin: 4.3 g/dL (ref 3.5–5.0)
Alkaline Phosphatase: 54 U/L (ref 38–126)
BUN: 30 mg/dL — ABNORMAL HIGH (ref 6–20)
CALCIUM: 9.5 mg/dL (ref 8.9–10.3)
CHLORIDE: 106 mmol/L (ref 101–111)
CO2: 21 mmol/L — AB (ref 22–32)
CREATININE: 1.49 mg/dL — AB (ref 0.44–1.00)
GFR, EST AFRICAN AMERICAN: 37 mL/min — AB (ref >60)
GFR, EST NON AFRICAN AMERICAN: 32 mL/min — AB (ref >60)
Glucose, Bld: 86 mg/dL (ref 65–99)
Potassium: 4 mmol/L (ref 3.5–5.1)
SODIUM: 140 mmol/L (ref 135–145)
Total Bilirubin: 1.2 mg/dL (ref 0.3–1.2)
Total Protein: 7.4 g/dL (ref 6.5–8.1)

## 2015-07-07 LAB — CBC
HCT: 42.1 % (ref 35.0–47.0)
HEMOGLOBIN: 14.2 g/dL (ref 12.0–16.0)
MCH: 25.5 pg — AB (ref 26.0–34.0)
MCHC: 33.8 g/dL (ref 32.0–36.0)
MCV: 75.5 fL — ABNORMAL LOW (ref 80.0–100.0)
PLATELETS: 164 10*3/uL (ref 150–440)
RBC: 5.57 MIL/uL — ABNORMAL HIGH (ref 3.80–5.20)
RDW: 15 % — ABNORMAL HIGH (ref 11.5–14.5)
WBC: 8.5 10*3/uL (ref 3.6–11.0)

## 2015-07-07 LAB — SALICYLATE LEVEL

## 2015-07-07 LAB — ACETAMINOPHEN LEVEL

## 2015-07-07 LAB — ETHANOL

## 2015-07-07 LAB — TROPONIN I

## 2015-07-07 NOTE — ED Notes (Signed)
Patients daughter called for an update:  Jethro Bolus [phone] I told her we had a sitter with her mom and that she was being a great patient.  Jenny Reichmann was concerned her mother would not communicate with [place].

## 2015-07-07 NOTE — ED Provider Notes (Signed)
[facility] Emergency Department Provider Note    ____________________________________________  Time seen: ~2210  I have reviewed the triage vital signs and the nursing notes.   HISTORY  Chief Complaint Suicidal   History limited by: Poor historian   HPI Tanganyika Kusum Gunion is a 82 y.o. female who presents to the emergency department via EMS. The patient cannot give any history. Per report the patient was talking to her daughter and stated she needed help. The daughter then called 911. She did make a comment saying that she wanted to be with Jesus. The patient herself simply states that she feels like she is being attacked in her head and her chest. She denies any pain in her head and her chest she cannot further elaborate what she means by saying attack. She denies any hallucinations.     Past Medical History  Diagnosis Date  . Bipolar 1 disorder ([place])   . Seasonal allergies   . Murmur, cardiac   . Coronary artery disease   . Chronic mitral valve disease   . Hypertension   . Chronic kidney disease     stage 3  . Deviated nasal septum   . Renal cyst, left     bilateral  . 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 Chronic kidney disease     Patient Active Problem List   Diagnosis Date Noted  . Severe bipolar I disorder with depression ([place]) 11/16/2014  . Enteritis 10/17/2014  . Gastroenteritis, non-infectious 10/17/2014  . Chronic kidney disease (CKD), stage III (moderate) 09/27/2014  . 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  . H/O nutritional disorder 09/27/2014  . HLD (hyperlipidemia) 09/27/2014  . Heart valve incompetence 09/27/2014  . Mitral and aortic incompetence 09/27/2014  . Personal history of other endocrine, nutritional and metabolic disease [id number]  . Hypothyroidism, postop 09/27/2014  . Bipolar disorder, current episode depressed, severe, without psychotic features ([place])   . Bipolar affective disorder,  current episode depressed ([place]) 09/25/2014  . Bipolar I disorder, most recent episode depressed ([place]) 09/25/2014  . Chronic kidney disease, stage IV (severe) ([place]) 09/19/2014  . Pure hypercholesterolemia 09/19/2014  . Acquired hypothyroidism 03/29/2014  . Bipolar affective disorder ([place]) 03/29/2014  . Bradycardia 03/29/2014  . Essential (primary) hypertension 03/29/2014  . MI (mitral incompetence) 03/29/2014  . Acquired cyst of kidney 06/18/2012    Past Surgical History  Procedure Laterality Date  . Thyroidectomy    . Cholecystectomy    . Esophagogastroduodenoscopy (egd) with propofol N/A 10/20/2014    Procedure: ESOPHAGOGASTRODUODENOSCOPY (EGD) WITH PROPOFOL;  Surgeon: [name], MD;  Location: [facility] ENDOSCOPY;  Service: Endoscopy;  Laterality: N/A;  . Cardiac catheterization    . Knee replacement, right      Current Outpatient Rx  Name  Route  Sig  Dispense  Refill  . ALPRAZolam (XANAX) 0.25 MG tablet   Oral   Take 1 tablet by mouth at bedtime as needed.         . ARIPiprazole (ABILIFY) 2 MG tablet   Oral   Take 2 mg by mouth daily.         [name] buPROPion (WELLBUTRIN SR) 100 MG 12 hr tablet   Oral   Take 100 mg by mouth 2 (two) times daily.          . Calcium Carbonate Antacid 400 MG CHEW   Oral   Chew by mouth.         . cholecalciferol (VITAMIN D) 1000 UNITS tablet   Oral  Take 1 tablet (1,000 Units total) by mouth daily.   30 tablet   0   . enalapril (VASOTEC) 2.5 MG tablet   Oral   Take 2.5 mg by mouth daily.          . furosemide (LASIX) 20 MG tablet   Oral   Take by mouth.         [name] HYDROcodone-acetaminophen (NORCO/VICODIN) 5-325 MG per tablet   Oral   Take by mouth.         . lamoTRIgine (LAMICTAL) 100 MG tablet   Oral   Take 1 tablet by mouth at bedtime.          [name] latanoprost (XALATAN) 0.005 % ophthalmic solution   Both Eyes   Place 1 drop into both eyes at bedtime.         [name] levothyroxine (SYNTHROID, LEVOTHROID) 100  MCG tablet   Oral   Take 100 mcg by mouth daily before breakfast.          . lovastatin (MEVACOR) 40 MG tablet      Take 1 tablet (40 mg total) by mouth nightly.      6   . meclizine (ANTIVERT) 25 MG tablet   Oral   Take 1 tablet (25 mg total) by mouth 3 (three) times daily as needed for dizziness.   20 tablet   0   . ondansetron (ZOFRAN) 4 MG tablet      TAKE ONE TABLET BY MOUTH EVERY 4 HOURS AS NEEDED FOR NAUSEA      1   . Oxcarbazepine (TRILEPTAL) 300 MG tablet   Oral   Take 300 mg by mouth daily.         . pantoprazole (PROTONIX) 40 MG tablet   Oral   Take 40 mg by mouth daily.          . risperiDONE (RISPERDAL) 0.25 MG tablet   Oral   Take 0.25 mg by mouth at bedtime.          . temazepam (RESTORIL) 15 MG capsule      TAKE ONE CAPSULE BY MOUTH AT BEDTIME FOR INSOMNIA      1   . venlafaxine XR (EFFEXOR-XR) 75 MG 24 hr capsule   Oral   Take 225 mg by mouth daily.      1   . vitamin B-12 (CYANOCOBALAMIN) 1000 MCG tablet   Oral   Take 1,000 mcg by mouth daily.           Allergies Aspirin; Codeine; Prednisone; and Valium  Family History  Problem Relation Age of Onset  . Heart Problems Mother   . Seizures Mother   . Stroke Mother   . Diverticulitis Mother   . Heart attack Father   . Heart attack Sister   . Pancreatitis Sister   . Cancer Sister   . Cancer Brother     Social History Social History  Substance Use Topics  . Smoking status: Former Research scientist (life sciences)  . Smokeless tobacco: Never Used     Comment: quit 1964  . Alcohol Use: No    Review of Systems  Constitutional: Negative for fever. Cardiovascular: Negative for chest pain. Respiratory: Negative for shortness of breath. Gastrointestinal: Negative for abdominal pain, vomiting and diarrhea. Neurological: Negative for headaches Psychiatric:Denies hallucinations. Denies SI or HI    10-point ROS otherwise negative.  ____________________________________________   PHYSICAL  EXAM:  VITAL SIGNS: ED Triage Vitals  Enc Vitals Group     BP 07/07/15  1940 140/114 mmHg     Pulse Rate 07/07/15 1940 67     Resp 07/07/15 1940 20     Temp 07/07/15 1940 97.7 F (36.5 C)     Temp Source 07/07/15 1940 Oral     SpO2 07/07/15 1940 99 %   Constitutional: Awake and alert, and clearly Eyes: Conjunctivae are normal. PERRL. Normal extraocular movements. ENT   Head: Normocephalic and atraumatic.   Nose: No congestion/rhinnorhea.   Mouth/Throat: Mucous membranes are moist.   Neck: No stridor. Hematological/Lymphatic/Immunilogical: No cervical lymphadenopathy. Cardiovascular: Normal rate, regular rhythm.  No murmurs, rubs, or gallops. Respiratory: Normal respiratory effort without tachypnea nor retractions. Breath sounds are clear and equal bilaterally. No wheezes/rales/rhonchi. Gastrointestinal: Soft and nontender. No distention. There is no CVA tenderness. Genitourinary: Deferred Musculoskeletal: Normal range of motion in all extremities. No joint effusions.  No lower extremity tenderness nor edema. Neurologic:  Normal speech and language. No gross focal neurologic deficits are appreciated.  Skin:  Skin is warm, dry and intact. No rash noted. Psychiatric: Patient repeating that things are attacking her chest and head.  ____________________________________________    LABS (pertinent positives/negatives)  Labs Reviewed  COMPREHENSIVE METABOLIC PANEL - Abnormal; Notable for the following:    CO2 21 (*)    BUN 30 (*)    Creatinine, Ser 1.49 (*)    ALT 11 (*)    GFR calc non Af Amer 32 (*)    GFR calc Af Amer 37 (*)    All other components within normal limits  ACETAMINOPHEN LEVEL - Abnormal; Notable for the following:    Acetaminophen (Tylenol), Serum <10 (*)    All other components within normal limits  CBC - Abnormal; Notable for the following:    RBC 5.57 (*)    MCV 75.5 (*)    MCH 25.5 (*)    RDW 15.0 (*)    All other components within normal  limits  ETHANOL  SALICYLATE LEVEL  URINE DRUG SCREEN, QUALITATIVE ([facility] ONLY)  TROPONIN I     ____________________________________________   EKG  None  ____________________________________________    RADIOLOGY  None   ____________________________________________   PROCEDURES  Procedure(s) performed: None  Critical Care performed: No  ____________________________________________   INITIAL IMPRESSION / ASSESSMENT AND PLAN / ED COURSE  Pertinent labs & imaging results that were available during my care of the patient were reviewed by me and considered in my medical decision making (see chart for details).  Patient presented to the emergency department today because of concern for help. Patient was brought in by EMS. Patient herself is either unwilling or unable to give any history. I did place patient under IVC given concern for noncompliance with Abilify and potential suicidal ideation. I feel the patient would benefit from further psychiatric evaluation.  ____________________________________________   FINAL CLINICAL IMPRESSION(S) / ED DIAGNOSES  Final diagnoses:  Abnormal behavior     Nance Pear, MD 07/08/15 1614

## 2015-07-07 NOTE — ED Notes (Signed)
Discontinue 1:1 sitter per Dr. Archie Balboa

## 2015-07-07 NOTE — ED Notes (Signed)
Per EMS, patient was on the phone with her daughter and according to her the patient said "I need help" I can't do this anymore" and the daughter called 911 to patients home.  EMS found her sitting on couch saying "I need help".  Patient denies wanting to hurt herself but then says :she wants to be with Jesus"  Patient takes Abilify and when asked if she is taking it she says yes but her daughter says she is not taking it.  All vitals WNL, patient asking for water at bedside.  AOx4.

## 2015-07-08 DIAGNOSIS — F919 Conduct disorder, unspecified: Secondary | ICD-10-CM | POA: Diagnosis not present

## 2015-07-08 MED ORDER — VENLAFAXINE HCL ER 150 MG PO CP24
225.0000 mg | ORAL_CAPSULE | Freq: Every day | ORAL | Status: DC
  Administered 2015-07-08 – 2015-07-09 (×2): 225 mg via ORAL
  Filled 2015-07-08 (×2): qty 1

## 2015-07-08 MED ORDER — VITAMIN D3 25 MCG (1000 UNIT) PO TABS
1000.0000 [IU] | ORAL_TABLET | Freq: Every day | ORAL | Status: DC
Start: 2015-07-08 — End: 2015-07-09
  Administered 2015-07-08 – 2015-07-09 (×2): 1000 [IU] via ORAL
  Filled 2015-07-08 (×2): qty 1

## 2015-07-08 MED ORDER — ARIPIPRAZOLE 2 MG PO TABS
2.0000 mg | ORAL_TABLET | Freq: Every day | ORAL | Status: DC
  Administered 2015-07-08 – 2015-07-09 (×2): 2 mg via ORAL
  Filled 2015-07-08 (×2): qty 1

## 2015-07-08 MED ORDER — ONDANSETRON HCL 4 MG PO TABS: 4.0000 mg | ORAL_TABLET | ORAL | Status: DC | PRN

## 2015-07-08 MED ORDER — RISPERIDONE 0.5 MG PO TABS
0.2500 mg | ORAL_TABLET | Freq: Every day | ORAL | Status: DC
  Administered 2015-07-08: 0.25 mg via ORAL
  Filled 2015-07-08: qty 1

## 2015-07-08 MED ORDER — LEVOTHYROXINE SODIUM 100 MCG PO TABS
100.0000 ug | ORAL_TABLET | Freq: Every day | ORAL | Status: DC
  Administered 2015-07-08 – 2015-07-09 (×2): 100 ug via ORAL
  Filled 2015-07-08 (×3): qty 1

## 2015-07-08 MED ORDER — OXCARBAZEPINE 300 MG PO TABS
300.0000 mg | ORAL_TABLET | Freq: Every day | ORAL | Status: DC
  Administered 2015-07-08 – 2015-07-09 (×2): 300 mg via ORAL
  Filled 2015-07-08 (×2): qty 1

## 2015-07-08 MED ORDER — BUPROPION HCL ER (SR) 100 MG PO TB12
100.0000 mg | ORAL_TABLET | Freq: Two times a day (BID) | ORAL | Status: DC
  Administered 2015-07-08 – 2015-07-09 (×2): 100 mg via ORAL
  Filled 2015-07-08 (×4): qty 1

## 2015-07-08 MED ORDER — MECLIZINE HCL 25 MG PO TABS: 25.0000 mg | ORAL_TABLET | Freq: Three times a day (TID) | ORAL | Status: DC | PRN

## 2015-07-08 MED ORDER — ALPRAZOLAM 0.5 MG PO TABS: 0.2500 mg | ORAL_TABLET | Freq: Every evening | ORAL | Status: DC | PRN

## 2015-07-08 MED ORDER — PANTOPRAZOLE SODIUM 40 MG PO TBEC
40.0000 mg | DELAYED_RELEASE_TABLET | Freq: Every day | ORAL | Status: DC
  Administered 2015-07-08 – 2015-07-09 (×2): 40 mg via ORAL
  Filled 2015-07-08 (×2): qty 1

## 2015-07-08 MED ORDER — VITAMIN B-12 1000 MCG PO TABS
1000.0000 ug | ORAL_TABLET | Freq: Every day | ORAL | Status: DC
  Administered 2015-07-09: 1000 ug via ORAL
  Filled 2015-07-08 (×2): qty 1

## 2015-07-08 MED ORDER — LATANOPROST 0.005 % OP SOLN
1.0000 [drp] | Freq: Every day | OPHTHALMIC | Status: DC
  Administered 2015-07-08: 1 [drp] via OPHTHALMIC
  Filled 2015-07-08 (×2): qty 2.5

## 2015-07-08 MED ORDER — FUROSEMIDE 40 MG PO TABS
20.0000 mg | ORAL_TABLET | Freq: Every day | ORAL | Status: DC
  Administered 2015-07-08 – 2015-07-09 (×2): 20 mg via ORAL
  Filled 2015-07-08 (×2): qty 1

## 2015-07-08 MED ORDER — PRAVASTATIN SODIUM 40 MG PO TABS
40.0000 mg | ORAL_TABLET | Freq: Every day | ORAL | Status: DC
  Administered 2015-07-08: 40 mg via ORAL
  Filled 2015-07-08: qty 1

## 2015-07-08 MED ORDER — ONDANSETRON 4 MG PO TBDP
ORAL_TABLET | ORAL | Status: AC
  Administered 2015-07-08: 4 mg via ORAL
  Filled 2015-07-08: qty 1

## 2015-07-08 MED ORDER — ENALAPRIL MALEATE 2.5 MG PO TABS
2.5000 mg | ORAL_TABLET | Freq: Every day | ORAL | Status: DC
  Administered 2015-07-08 – 2015-07-09 (×2): 2.5 mg via ORAL
  Filled 2015-07-08 (×2): qty 1

## 2015-07-08 MED ORDER — ONDANSETRON 4 MG PO TBDP
4.0000 mg | ORAL_TABLET | Freq: Once | ORAL | Status: AC
  Administered 2015-07-08: 4 mg via ORAL

## 2015-07-08 MED ORDER — LAMOTRIGINE 25 MG PO TABS
100.0000 mg | ORAL_TABLET | Freq: Every day | ORAL | Status: DC
  Administered 2015-07-08: 100 mg via ORAL
  Filled 2015-07-08: qty 4

## 2015-07-08 NOTE — ED Notes (Signed)

## 2015-07-08 NOTE — ED Provider Notes (Signed)
-----------------------------------------   6:00 PM on 07/08/2015 -----------------------------------------  I, Nance Pear, attending physician, personally viewed and interpreted this EKG  EKG Time: 1617 Rate: 74 Rhythm: atrial fibrillation Axis: normal Intervals: qtc 449 QRS: narrow ST changes: no st elevation Impression: abnormal ekg   Nance Pear, MD 07/08/15 1800

## 2015-07-08 NOTE — ED Notes (Signed)
Patient OOB to bathroom. Ambulates without assistance.

## 2015-07-08 NOTE — ED Notes (Signed)
Patient belongings taken to secure room in [facility]

## 2015-07-08 NOTE — BH Assessment (Addendum)
Assessment Note  Stephanie Anthony is an 82 y.o. female who presents to the ER due to increase symptoms of depression. Per the report of the patient, her daughter called 911 because of concerns of her not eating and staying in the bed. Patient states she's had no motivation to do things. "It's like I'm having a nervous breakdown." She presented to the ER 12/2014 with similar presentation. During that time, she was placed with [facility], on their Geriatric Psychiatric Unit.  Per the report of the patient son ([name]), "My mother, this is a repetitive thing. If you look at her records, she's been up there [facility] ER) for the same thing."  Patient was under the Psychiatric care of [name] and when he retired, she's been decompensating and having frequent visits to the ER. Since her psychiatrist retired, she's been with [facility]. Patient hasn't been complaint with treatment. Son has made several appointments but she cancels at the last minute.   Son further explains, he's not sure if the patient is "being gamey or if she's really have a problem.It's hard to say." Patient can perform her ADL's, cook, clean and do for herself. "She got use to my dad doing things for her. And now I thinks she want somebody else to do it ."  Patient denies the use of mind altering substances. Has no involvement with the legal system. She denies SI/HI and AV/H.  Her current state of depression, she isn't taking care of herself.   Consulted with [name], Nurse Practitioner [name]), who discussed patient with [name] (Psychiatrist) and they recommend Psychiatric Inpatient with a facility that specializes with Geriatric Population.  Diagnosis: Bipolar  Past Medical History:  Past Medical History  Diagnosis Date  . Bipolar 1 disorder ([place])   . Seasonal allergies   . Murmur, cardiac   . Coronary artery disease   . Chronic mitral valve disease   . Hypertension   .  Chronic kidney disease     stage 3  . Deviated nasal septum   . Renal cyst, left     bilateral  . 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 Chronic kidney disease     Past Surgical History  Procedure Laterality Date  . Thyroidectomy    . Cholecystectomy    . Esophagogastroduodenoscopy (egd) with propofol N/A 10/20/2014    Procedure: ESOPHAGOGASTRODUODENOSCOPY (EGD) WITH PROPOFOL;  Surgeon: [name], MD;  Location: [facility] ENDOSCOPY;  Service: Endoscopy;  Laterality: N/A;  . Cardiac catheterization    . Knee replacement, right      Family History:  Family History  Problem Relation Age of Onset  . Heart Problems Mother   . Seizures Mother   . Stroke Mother   . Diverticulitis Mother   . Heart attack Father   . Heart attack Sister   . Pancreatitis Sister   . Cancer Sister   . Cancer Brother     Social History:  reports that she has quit smoking. She has never used smokeless tobacco. She reports that she does not drink alcohol or use illicit drugs.  Additional Social History:  Alcohol / Drug Use Pain Medications: See PTA Prescriptions: See PTA Over the Counter: See PTA History of alcohol / drug use?: No history of alcohol / drug abuse Longest period of sobriety (when/how long): Reports of no use Negative Consequences of Use:  (Reports of no use) Withdrawal Symptoms:  (Reports of no use)  CIWA: CIWA-Ar BP: 105/84 mmHg Pulse  Rate: 66 COWS:    Allergies:  Allergies  Allergen Reactions  . Aspirin Other (See Comments)    Reaction: Rectal bleeding  . Codeine Nausea And Vomiting  . Prednisone Other (See Comments)    Reaction: Hallucinations  . Valium [Diazepam] Other (See Comments)    Strange feelings. Reaction: Strange feelings    Home Medications:  (Not in a hospital admission)  OB/GYN Status:  No LMP recorded. Patient is postmenopausal.  General Assessment Data Location of Assessment: [facility] ED TTS Assessment: In system Is this a Tele or Face-to-Face  Assessment?: Face-to-Face Is this an Initial Assessment or a Re-assessment for this encounter?: Initial Assessment Marital status: Widowed [place] name: Alexander Mt Is patient pregnant?: No Pregnancy Status: No Living Arrangements: Alone Can pt return to current living arrangement?: Yes Admission Status: Involuntary Is patient capable of signing voluntary admission?: No Referral Source: Other Insurance type: Medicare  Medical Screening Exam ([place]) Medical Exam completed: Yes  Crisis Care Plan Living Arrangements: Alone Legal Guardian: Other: (None) Name of Psychiatrist: None Reported Name of Therapist: [employer]  Education Status Is patient currently in school?: No Current Grade: n/a Highest grade of school patient has completed: 6th Grade Name of school: n/a Contact person: n/a  Risk to self with the past 6 months Suicidal Ideation: No Has patient been a risk to self within the past 6 months prior to admission? : No Suicidal Intent: No Has patient had any suicidal intent within the past 6 months prior to admission? : No Is patient at risk for suicide?: No Suicidal Plan?: No Has patient had any suicidal plan within the past 6 months prior to admission? : No Access to Means: No What has been your use of drugs/alcohol within the last 12 months?: None Reported Previous Attempts/Gestures: No How many times?: 0 Other Self Harm Risks: None Reported Triggers for Past Attempts: None known Intentional Self Injurious Behavior: None Family Suicide History: No (Brother "in the (19)50's") Recent stressful life event(s): Other (Comment) (Depressed) Persecutory voices/beliefs?: No Depression: Yes Depression Symptoms: Tearfulness, Fatigue, Isolating, Guilt, Loss of interest in usual pleasures, Feeling worthless/self pity, Feeling angry/irritable Substance abuse history and/or treatment for substance abuse?: No Suicide prevention information given to  non-admitted patients: Not applicable  Risk to Others within the past 6 months Homicidal Ideation: No Does patient have any lifetime risk of violence toward others beyond the six months prior to admission? : No Thoughts of Harm to Others: No Current Homicidal Intent: No Current Homicidal Plan: No Access to Homicidal Means: No Identified Victim: None Reported History of harm to others?: No Assessment of Violence: None Noted Violent Behavior Description: None Reported Does patient have access to weapons?: No Criminal Charges Pending?: No Does patient have a court date: No Is patient on probation?: No  Psychosis Hallucinations: None noted Delusions: None noted  Mental Status Report Appearance/Hygiene: In scrubs, Unremarkable, In hospital gown Eye Contact: Poor Motor Activity: Unable to assess (Patient laying in the bed ) Speech: Logical/coherent Level of Consciousness: Alert Mood: Anxious, Sad, Pleasant Affect: Sad, Appropriate to circumstance Anxiety Level: Minimal Thought Processes: Coherent, Relevant Judgement: Unimpaired Orientation: Person, Place, Time, Situation, Appropriate for developmental age Obsessive Compulsive Thoughts/Behaviors: Minimal  Cognitive Functioning Concentration: Normal Memory: Recent Intact, Remote Intact IQ: Average Insight: Fair Impulse Control: Poor Appetite: Poor Weight Loss: 0 (Patient states she don't know) Weight Gain: 0 (Patient states she don't know) Sleep: Decreased Total Hours of Sleep: 5 Vegetative Symptoms: None  ADLScreening [facility] Assessment Services)  Patient's cognitive ability adequate to safely complete daily activities?: Yes Patient able to express need for assistance with ADLs?: Yes Independently performs ADLs?: Yes (appropriate for developmental age)  Prior Inpatient Therapy Prior Inpatient Therapy: Yes Prior Therapy Dates: 12/2014 Prior Therapy Facilty/Provider(s): [facility] Reason for Treatment:  Depression  Prior Outpatient Therapy Prior Outpatient Therapy: Yes Prior Therapy Facilty/Provider(s): [employer] [facility]  Reason for Treatment: Depression Does patient have an ACCT team?: No Does patient have Intensive In-House Services?  : No Does patient have [facility] services? : No Does patient have P4CC services?: No  ADL Screening (condition at time of admission) Patient's cognitive ability adequate to safely complete daily activities?: Yes Patient able to express need for assistance with ADLs?: Yes Independently performs ADLs?: Yes (appropriate for developmental age)       Abuse/Neglect Assessment (Assessment to be complete while patient is alone) Physical Abuse: Denies Verbal Abuse: Denies Sexual Abuse: Denies Exploitation of patient/patient's resources: Denies Self-Neglect: Denies Values / Beliefs Cultural Requests During Hospitalization: None Spiritual Requests During Hospitalization: None Consults Spiritual Care Consult Needed: No Social Work Consult Needed: No Regulatory affairs officer (For Healthcare) Does patient have an advance directive?: No    Additional Information 1:1 In Past 12 Months?: Yes CIRT Risk: No Elopement Risk: No Does patient have medical clearance?: No  Child/Adolescent Assessment Running Away Risk:  (Patient is an adult)  Disposition:  Disposition Initial Assessment Completed for this Encounter: Yes Disposition of Patient: Other dispositions (ER MD ordered Psych Consult) Other disposition(s): Other (Comment) (ER MD ordered Psych Consult)  On Site Evaluation by:   Reviewed with Physician:     Gunnar Fusi, MS, LCAS, LPC, [place], CCSI 07/08/2015 9:05 AM

## 2015-07-08 NOTE — ED Provider Notes (Signed)
-----------------------------------------   8:17 AM on 07/08/2015 -----------------------------------------   Blood pressure 105/84, pulse 66, temperature 98.7 F (37.1 C), temperature source Oral, resp. rate 20, SpO2 98 %.  The patient had no acute events since last update.  Calm and cooperative at this time.  Disposition is pending per Psychiatry/Behavioral Medicine team recommendations.     Loney Hering, MD 07/08/15 [phone]

## 2015-07-08 NOTE — ED Notes (Signed)
Pt still has not wanted to eat breakfast. Tray at bedside. Waiting on TTS assessment

## 2015-07-08 NOTE — BHH Counselor (Signed)
TTS faxed medication list to [facility] for pending admission

## 2015-07-08 NOTE — BH Assessment (Addendum)
Referral information for Geriatric Placement have been faxed to;    [facility] [phone]),    [facility] [phone],    [facility]([phone]) Pending review. They don't have a bed today but may have one tomorrow (07/09/2015),    [facility]([phone]),    [phone]),    [phone]),    [facility] [phone]),    [facility]([phone]).

## 2015-07-08 NOTE — BH Assessment (Signed)
Assessment Completed.  Consulted with [name], Nurse Practitioner [name]), who discussed patient with [name] (Psychiatrist) and they recommend Psychiatric Inpatient with a facility that specializes with Geriatric Population.  ER MD (Dr. Jacqualine Code) informed of the recommendation.

## 2015-07-09 DIAGNOSIS — F314 Bipolar disorder, current episode depressed, severe, without psychotic features: Secondary | ICD-10-CM | POA: Diagnosis not present

## 2015-07-09 DIAGNOSIS — F919 Conduct disorder, unspecified: Secondary | ICD-10-CM | POA: Diagnosis not present

## 2015-07-09 DIAGNOSIS — R630 Anorexia: Secondary | ICD-10-CM

## 2015-07-09 NOTE — Consult Note (Signed)
[facility] Face-to-Face Psychiatry Consult   Reason for Consult:  Consult for this 82 year old woman with a history of depression and a diagnosis of bipolar disorder presents to the hospital with multiple symptoms of depression and poor self-care Referring Physician:  Jimmye Norman Patient Identification: Stephanie Anthony MRN:  [id number] Principal Diagnosis: Bipolar affective disorder, current episode depressed ([place]) Diagnosis:   Patient Active Problem List   Diagnosis Date Noted  . Anorexia [R63.0] 07/09/2015  . Severe bipolar I disorder with depression ([place]) [F31.4] 11/16/2014  . Enteritis [K52.9] 10/17/2014  . Gastroenteritis, non-infectious [K52.9] 10/17/2014  . Chronic kidney disease (CKD), stage III (moderate) [N18.3] 09/27/2014  . Familial multiple lipoprotein-type hyperlipidemia [E78.4] 09/27/2014  . H/O nutritional disorder [Z86.39] 09/27/2014  . HLD (hyperlipidemia) [E78.5] 09/27/2014  . Heart valve incompetence [I38] 09/27/2014  . Mitral and aortic incompetence [I08.0] 09/27/2014  . Personal history of other endocrine, nutritional and metabolic disease [K16.01] 09/27/2014  . Hypothyroidism, postop [E89.0] 09/27/2014  . Bipolar disorder, current episode depressed, severe, without psychotic features ([place]) [F31.4]   . Bipolar affective disorder, current episode depressed ([place]) [F31.30] 09/25/2014  . Bipolar I disorder, most recent episode depressed ([place]) [F31.30] 09/25/2014  . Chronic kidney disease, stage IV (severe) ([place]) [N18.4] 09/19/2014  . Pure hypercholesterolemia [E78.00] 09/19/2014  . Acquired hypothyroidism [E03.9] 03/29/2014  . Bipolar affective disorder ([place]) [F31.9] 03/29/2014  . Bradycardia [R00.1] 03/29/2014  . Essential (primary) hypertension [I10] 03/29/2014  . MI (mitral incompetence) [I34.0] 03/29/2014  . Acquired cyst of kidney [N28.1] 06/18/2012    Total Time spent with patient: 1 hour  Subjective:   Stephanie Anthony is a 82 y.o. female patient admitted with  "I think I'm having a nervous breakdown".  HPI:  Patient interviewed. Chart reviewed. Labs reviewed and other collateral information reviewed. 82 year old woman has a history of bipolar disorder she is here in the hospital after several days of much worse depression. She says that last week she had something she called a "dizzy spell" and since that time has not felt like eating. She has no motivation. Barely gets out of bed. Feels very depressed. She is having some hopeless passive thoughts about wanting to die. She denies that she's having any hallucinations. She appears to have stopped all of her medicines including those for her multiple medical problems because of her depression. She is not taking care of her basic health. Patient describes all of this to the "dizzy spell" although she also has the insight that this is part of her mental illness. She most recently had been seen at [facility] ever since [name] retired late last year and her medicine was switched to a low dose of Abilify but she didn't think that it was effective at all. She claims that she's only been having this severe depression for about a week although she then admits that she had another spell similar to this not very long ago.  Medical history: Multiple medical problems including diabetes high blood pressure hypothyroidism. She is not taking any of her medicines right now. Has some chronic renal insufficiency as well.  Substance abuse history: She says that many years ago she used to drink but she stopped that and currently does not drink or abuse any drugs.  Social history: She lives by herself. Has adult children who check in on her. Sounds like she feels pretty lonely.  Past Psychiatric History: Patient has a long history of bipolar disorder largely presenting with depression. She does have a past history of suicide attempts  in the past by cutting. Seems to indicate that she last did this about a year ago. She says that lithium  had been very effective for years but she have to stop it when her kidney started to go bad. See Dr. Chen for years. Seems to feel that she's gotten much more unstable since then. Patient has never had ECT. Can't remember details of other affective medicine.    Risk to Self: Suicidal Ideation: No Suicidal Intent: No Is patient at risk for suicide?: No Suicidal Plan?: No Access to Means: No What has been your use of drugs/alcohol within the last 12 months?: None Reported How many times?: 0 Other Self Harm Risks: None Reported Triggers for Past Attempts: None known Intentional Self Injurious Behavior: None Risk to Others: Homicidal Ideation: No Thoughts of Harm to Others: No Current Homicidal Intent: No Current Homicidal Plan: No Access to Homicidal Means: No Identified Victim: None Reported History of harm to others?: No Assessment of Violence: None Noted Violent Behavior Description: None Reported Does patient have access to weapons?: No Criminal Charges Pending?: No Does patient have a court date: No Prior Inpatient Therapy: Prior Inpatient Therapy: Yes Prior Therapy Dates: 12/2014 Prior Therapy Facilty/Provider(s): [facility] Reason for Treatment: Depression Prior Outpatient Therapy: Prior Outpatient Therapy: Yes Prior Therapy Facilty/Provider(s): [facility]  Reason for Treatment: Depression Does patient have an ACCT team?: No Does patient have Intensive In-House Services?  : No Does patient have [facility] services? : No Does patient have P4CC services?: No  Past Medical History:  Past Medical History  Diagnosis Date  . Bipolar 1 disorder (HCC)   . Seasonal allergies   . Murmur, cardiac   . Coronary artery disease   . Chronic mitral valve disease   . Hypertension   . Chronic kidney disease     stage 3  . Deviated nasal septum   . Renal cyst, left     bilateral  . Nephrogenic diabetes insipidus (HCC)   . Chronic kidney disease      Past Surgical History  Procedure Laterality Date  . Thyroidectomy    . Cholecystectomy    . Esophagogastroduodenoscopy (egd) with propofol N/A 10/20/2014    Procedure: ESOPHAGOGASTRODUODENOSCOPY (EGD) WITH PROPOFOL;  Surgeon: Martin U Skulskie, MD;  Location: [facility] ENDOSCOPY;  Service: Endoscopy;  Laterality: N/A;  . Cardiac catheterization    . Knee replacement, right     Family History:  Family History  Problem Relation Age of Onset  . Heart Problems Mother   . Seizures Mother   . Stroke Mother   . Diverticulitis Mother   . Heart attack Father   . Heart attack Sister   . Pancreatitis Sister   . Cancer Sister   . Cancer Brother    Family Psychiatric  History: Patient indicates that she had a brother who committed suicide and that there were other members of her family with mental health issues Social History:  History  Alcohol Use No     History  Drug Use No    Social History   Social History  . Marital Status: Widowed    Spouse Name: N/A  . Number of Children: N/A  . Years of Education: N/A   Social History Main Topics  . Smoking status: Former Smoker  . Smokeless tobacco: Never Used     Comment: quit 1964  . Alcohol Use: No  . Drug Use: No  . Sexual Activity: No   Other Topics Concern  . None     Social History Narrative   Additional Social History:    Allergies:   Allergies  Allergen Reactions  . Aspirin Other (See Comments)    Reaction: Rectal bleeding  . Codeine Nausea And Vomiting  . Prednisone Other (See Comments)    Reaction: Hallucinations  . Valium [Diazepam] Other (See Comments)    Strange feelings. Reaction: Strange feelings    Labs:  Results for orders placed or performed during the hospital encounter of 07/07/15 (from the past 48 hour(s))  Comprehensive metabolic panel     Status: Abnormal   Collection Time: 07/07/15  7:55 PM  Result Value Ref Range   Sodium 140 135 - 145 mmol/L   Potassium 4.0 3.5 - 5.1 mmol/L   Chloride 106 101 -  111 mmol/L   CO2 21 (L) 22 - 32 mmol/L   Glucose, Bld 86 65 - 99 mg/dL   BUN 30 (H) 6 - 20 mg/dL   Creatinine, Ser 1.49 (H) 0.44 - 1.00 mg/dL   Calcium 9.5 8.9 - 10.3 mg/dL   Total Protein 7.4 6.5 - 8.1 g/dL   Albumin 4.3 3.5 - 5.0 g/dL   AST 18 15 - 41 U/L   ALT 11 (L) 14 - 54 U/L   Alkaline Phosphatase 54 38 - 126 U/L   Total Bilirubin 1.2 0.3 - 1.2 mg/dL   GFR calc non Af Amer 32 (L) >60 mL/min   GFR calc Af Amer 37 (L) >60 mL/min    Comment: (NOTE) The eGFR has been calculated using the CKD EPI equation. This calculation has not been validated in all clinical situations. eGFR's persistently <60 mL/min signify possible Chronic Kidney Disease.    Anion gap 13 5 - 15  Ethanol (ETOH)     Status: None   Collection Time: 07/07/15  7:55 PM  Result Value Ref Range   Alcohol, Ethyl (B) <5 <5 mg/dL    Comment:        LOWEST DETECTABLE LIMIT FOR SERUM ALCOHOL IS 5 mg/dL FOR MEDICAL PURPOSES ONLY   Salicylate level     Status: None   Collection Time: 07/07/15  7:55 PM  Result Value Ref Range   Salicylate Lvl <4.0 2.8 - 30.0 mg/dL  Acetaminophen level     Status: Abnormal   Collection Time: 07/07/15  7:55 PM  Result Value Ref Range   Acetaminophen (Tylenol), Serum <10 (L) 10 - 30 ug/mL    Comment:        THERAPEUTIC CONCENTRATIONS VARY SIGNIFICANTLY. A RANGE OF 10-30 ug/mL MAY BE AN EFFECTIVE CONCENTRATION FOR MANY PATIENTS. HOWEVER, SOME ARE BEST TREATED AT CONCENTRATIONS OUTSIDE THIS RANGE. ACETAMINOPHEN CONCENTRATIONS >150 ug/mL AT 4 HOURS AFTER INGESTION AND >50 ug/mL AT 12 HOURS AFTER INGESTION ARE OFTEN ASSOCIATED WITH TOXIC REACTIONS.   CBC     Status: Abnormal   Collection Time: 07/07/15  7:55 PM  Result Value Ref Range   WBC 8.5 3.6 - 11.0 K/uL   RBC 5.57 (H) 3.80 - 5.20 MIL/uL   Hemoglobin 14.2 12.0 - 16.0 g/dL   HCT 42.1 35.0 - 47.0 %   MCV 75.5 (L) 80.0 - 100.0 fL   MCH 25.5 (L) 26.0 - 34.0 pg   MCHC 33.8 32.0 - 36.0 g/dL   RDW 15.0 (H) 11.5 - 14.5 %    Platelets 164 150 - 440 K/uL  Troponin I     Status: None   Collection Time: 07/07/15  7:55 PM  Result Value Ref Range   Troponin I <0.03 <0.031   ng/mL    Comment:        NO INDICATION OF MYOCARDIAL INJURY.   Urine Drug Screen, Qualitative ([facility] only)     Status: None   Collection Time: 07/07/15  9:01 PM  Result Value Ref Range   Tricyclic, Ur Screen NONE DETECTED NONE DETECTED   Amphetamines, Ur Screen NONE DETECTED NONE DETECTED   MDMA (Ecstasy)Ur Screen NONE DETECTED NONE DETECTED   Cocaine Metabolite,Ur [place] NONE DETECTED NONE DETECTED   Opiate, Ur Screen NONE DETECTED NONE DETECTED   Phencyclidine (PCP) Ur S NONE DETECTED NONE DETECTED   Cannabinoid 50 Ng, Ur [place] NONE DETECTED NONE DETECTED   Barbiturates, Ur Screen NONE DETECTED NONE DETECTED   Benzodiazepine, Ur Scrn NONE DETECTED NONE DETECTED   Methadone Scn, Ur NONE DETECTED NONE DETECTED    Comment: (NOTE) 100  Tricyclics, urine               Cutoff 1000 ng/mL 200  Amphetamines, urine             Cutoff 1000 ng/mL 300  MDMA (Ecstasy), urine           Cutoff 500 ng/mL 400  Cocaine Metabolite, urine       Cutoff 300 ng/mL 500  Opiate, urine                   Cutoff 300 ng/mL 600  Phencyclidine (PCP), urine      Cutoff 25 ng/mL 700  Cannabinoid, urine              Cutoff 50 ng/mL 800  Barbiturates, urine             Cutoff 200 ng/mL 900  Benzodiazepine, urine           Cutoff 200 ng/mL 1000 Methadone, urine                Cutoff 300 ng/mL 1100 1200 The urine drug screen provides only a preliminary, unconfirmed 1300 analytical test result and should not be used for non-medical 1400 purposes. Clinical consideration and professional judgment should 1500 be applied to any positive drug screen result due to possible 1600 interfering substances. A more specific alternate chemical method 1700 must be used in order to obtain a confirmed analytical result.  1800 Gas chromato graphy / mass spectrometry (GC/MS) is the  preferred 1900 confirmatory method.     Current Facility-Administered Medications  Medication Dose Route Frequency Provider Last Rate Last Dose  . ALPRAZolam (XANAX) tablet 0.25 mg  0.25 mg Oral QHS PRN Allison P Webster, MD      . ARIPiprazole (ABILIFY) tablet 2 mg  2 mg Oral Daily Allison P Webster, MD   2 mg at 07/09/15 1052  . buPROPion (WELLBUTRIN SR) 12 hr tablet 100 mg  100 mg Oral BID Allison P Webster, MD   100 mg at 07/09/15 1052  . cholecalciferol (VITAMIN D) tablet 1,000 Units  1,000 Units Oral Daily Allison P Webster, MD   1,000 Units at 07/09/15 1052  . enalapril (VASOTEC) tablet 2.5 mg  2.5 mg Oral Daily Allison P Webster, MD   2.5 mg at 07/09/15 1051  . furosemide (LASIX) tablet 20 mg  20 mg Oral Daily Allison P Webster, MD   20 mg at 07/09/15 1052  . lamoTRIgine (LAMICTAL) tablet 100 mg  100 mg Oral QHS Allison P Webster, MD   100 mg at 07/08/15 2217  . latanoprost (XALATAN) 0.005 % ophthalmic solution 1 drop  1   drop Both Eyes QHS Loney Hering, MD   1 drop at 07/08/15 2223  . levothyroxine (SYNTHROID, LEVOTHROID) tablet 100 mcg  100 mcg Oral QAC breakfast Loney Hering, MD   100 mcg at 07/09/15 0855  . meclizine (ANTIVERT) tablet 25 mg  25 mg Oral TID PRN Loney Hering, MD      . ondansetron [facility]) tablet 4 mg  4 mg Oral Q4H PRN Loney Hering, MD      . Oxcarbazepine (TRILEPTAL) tablet 300 mg  300 mg Oral Daily Loney Hering, MD   300 mg at 07/09/15 1051  . pantoprazole (PROTONIX) EC tablet 40 mg  40 mg Oral Daily Loney Hering, MD   40 mg at 07/09/15 1052  . pravastatin (PRAVACHOL) tablet 40 mg  40 mg Oral q1800 Loney Hering, MD   40 mg at 07/08/15 1749  . risperiDONE (RISPERDAL) tablet 0.25 mg  0.25 mg Oral QHS Loney Hering, MD   0.25 mg at 07/08/15 2215  . venlafaxine XR (EFFEXOR-XR) 24 hr capsule 225 mg  225 mg Oral Daily Loney Hering, MD   225 mg at 07/09/15 1052  . vitamin B-12 (CYANOCOBALAMIN) tablet 1,000 mcg  1,000 mcg Oral  Daily Loney Hering, MD   1,000 mcg at 07/09/15 1051   Current Outpatient Prescriptions  Medication Sig Dispense Refill  . ALPRAZolam (XANAX) 0.25 MG tablet Take 1 tablet by mouth at bedtime as needed.    . ARIPiprazole (ABILIFY) 2 MG tablet Take 2 mg by mouth daily.    [name] buPROPion (WELLBUTRIN SR) 100 MG 12 hr tablet Take 100 mg by mouth 2 (two) times daily.     . Calcium Carbonate Antacid 400 MG CHEW Chew 1 tablet by mouth daily as needed.     . cholecalciferol (VITAMIN D) 1000 UNITS tablet Take 1 tablet (1,000 Units total) by mouth daily. 30 tablet 0  . enalapril (VASOTEC) 2.5 MG tablet Take 2.5 mg by mouth daily.     . furosemide (LASIX) 20 MG tablet Take 20 mg by mouth daily.     [name] HYDROcodone-acetaminophen (NORCO/VICODIN) 5-325 MG per tablet Take 1 tablet by mouth every 6 (six) hours as needed.     . lamoTRIgine (LAMICTAL) 100 MG tablet Take 1 tablet by mouth at bedtime.     [name] latanoprost (XALATAN) 0.005 % ophthalmic solution Place 1 drop into both eyes at bedtime.    [name] levothyroxine (SYNTHROID, LEVOTHROID) 100 MCG tablet Take 100 mcg by mouth daily before breakfast.     . lovastatin (MEVACOR) 40 MG tablet Take 1 tablet (40 mg total) by mouth nightly.  6  . meclizine (ANTIVERT) 25 MG tablet Take 1 tablet (25 mg total) by mouth 3 (three) times daily as needed for dizziness. 20 tablet 0  . ondansetron (ZOFRAN) 4 MG tablet TAKE ONE TABLET BY MOUTH EVERY 4 HOURS AS NEEDED FOR NAUSEA  1  . Oxcarbazepine (TRILEPTAL) 300 MG tablet Take 300 mg by mouth daily.    . pantoprazole (PROTONIX) 40 MG tablet Take 40 mg by mouth daily.     . risperiDONE (RISPERDAL) 0.25 MG tablet Take 0.25 mg by mouth at bedtime.     . temazepam (RESTORIL) 15 MG capsule TAKE ONE CAPSULE BY MOUTH AT BEDTIME FOR INSOMNIA  1  . venlafaxine XR (EFFEXOR-XR) 75 MG 24 hr capsule Take 225 mg by mouth daily.  1  . vitamin B-12 (CYANOCOBALAMIN) 1000 MCG tablet Take 1,000 mcg by mouth  daily.      Musculoskeletal: Strength &  Muscle Tone: decreased Gait & Station: unable to stand Patient leans: N/A  Psychiatric Specialty Exam: Review of Systems  Constitutional: Positive for malaise/fatigue.  HENT: Negative.   Eyes: Negative.   Respiratory: Negative.   Cardiovascular: Negative.   Gastrointestinal: Negative.   Musculoskeletal: Negative.   Skin: Negative.   Neurological: Positive for dizziness and weakness.  Psychiatric/Behavioral: Positive for depression, suicidal ideas and memory loss. Negative for hallucinations and substance abuse. The patient is nervous/anxious and has insomnia.     Blood pressure 140/69, pulse 77, temperature 97.6 F (36.4 C), temperature source Oral, resp. rate 20, SpO2 96 %.There is no weight on file to calculate BMI.  General Appearance: Casual  Eye Contact::  Minimal  Speech:  Slow  Volume:  Decreased  Mood:  Dysphoric  Affect:  Depressed  Thought Process:  Circumstantial  Orientation:  Other:  She knew where she was in the basic situation although she did say it was 1917 and did not correct her self  Thought Content:  Negative  Suicidal Thoughts:  Yes.  without intent/plan  Homicidal Thoughts:  No  Memory:  Immediate;   Good Recent;   Poor Remote;   Fair  Judgement:  Impaired  Insight:  Shallow  Psychomotor Activity:  Psychomotor Retardation  Concentration:  Poor  Recall:  Fair  Fund of Knowledge:Fair  Language: Fair  Akathisia:  No  Handed:  Right  AIMS (if indicated):     Assets:  Desire for Improvement Financial Resources/Insurance Housing Resilience  ADL's:  Impaired  Cognition: Impaired,  Mild  Sleep:      Treatment Plan Summary: Daily contact with patient to assess and evaluate symptoms and progress in treatment, Medication management and Plan This is a 81-year-old woman with bipolar disorder diagnosis presenting with multiple symptoms of major depression. Dangerous to self and that she is failing to take care of herself not eating well not taking her  medicine and endangering her health. Not obviously psychotic but not thinking clearly impaired memory impaired cognition. Patient needs inpatient treatment for stability. Because of her age and medical problems she is not appropriate for admission to our unit. She has been identified as appropriate for geropsychiatry and will be transferred to a geropsychiatry unit. Continue IVC. Continue current medication. Currently blood pressure is under better control. Case reviewed with ER doctor and TTS and patient is agreeable to the plan.  Disposition: Recommend psychiatric Inpatient admission when medically cleared. Supportive therapy provided about ongoing stressors.   , MD 07/09/2015 1:00 PM  

## 2015-07-09 NOTE — ED Provider Notes (Signed)
-----------------------------------------   7:42 AM on 07/09/2015 -----------------------------------------   Blood pressure 161/77, pulse 73, temperature 97.8 F (36.6 C), temperature source Oral, resp. rate 13, SpO2 97 %.  The patient had no acute events since last update.  Calm and cooperative at this time.  Patient has been referred out for geriatric psychiatry placement.   Hinda Kehr, MD 07/09/15 [phone]

## 2015-07-09 NOTE — BH Assessment (Addendum)
Patient has been accepted to [facility]).  Patient assigned to the Adult Acute Unit Accepting physician is Dr. Janeann Merl.  Call report to [phone] (ask for Adult Unit).  Representative was [facility] ER Staff is aware of it Lillia Pauls, ER Sect.; Dr. Owens Shark, [place], Patient's Nurse)    Patient's Family/Support System [facility] [phone]) have been updated as well.   [facility] is requesting she bring her "eye drops" because they don't have them at their location. Writer informed the son and he will try to get them. It will be after 4pm before he can bring them and he isn't sure, if he know where they are located in her home. Writer updated ER MD (Dr. Owens Shark) and he states he will try to get them from [place].

## 2015-07-09 NOTE — ED Provider Notes (Signed)
Patient evaluated by Dr. for heme with plan to transfer the patient to [place], MD 07/09/15 1452

## 2015-07-09 NOTE — ED Notes (Signed)
Eye drops requested from pharmacy

## 2015-07-09 NOTE — ED Notes (Signed)
Report given to Candy at [facility]

## 2015-07-09 NOTE — BH Assessment (Signed)
Additional Information (Home medications) faxed to [facility]. She's pending review for possible placement with them.

## 2015-07-09 NOTE — ED Notes (Signed)
Pt sitting on side of bed eating lunch, pt calm and cooperative

## 2015-07-09 NOTE — ED Notes (Signed)
Pt ate 50% of lunch

## 2015-07-09 NOTE — BHH Counselor (Signed)
Patient has been placed on the waitlist for [facility]

## 2015-07-11 DIAGNOSIS — N179 Acute kidney failure, unspecified: Secondary | ICD-10-CM | POA: Insufficient documentation

## 2016-01-22 ENCOUNTER — Other Ambulatory Visit: Payer: Self-pay

## 2017-02-24 IMAGING — CT CT ABD-PELV W/O CM
1 of 2 series · 14 of 32 positions shown, 18 images · non-contrast
Comparison: CT 07/24/2012, MRI 12/22/2013

CLINICAL DATA: 80-year-old female with abdominal pain radiating
into the back. Nausea and vomiting.

EXAM:
CT ABDOMEN AND PELVIS WITHOUT CONTRAST
TECHNIQUE: Multidetector CT imaging of the abdomen and pelvis was performed
following the standard protocol without IV contrast.

[Series 2: routine abd pel without · axial · non-contrast · 0.71mm/px · z∈[-554,-114]mm · 14 of 96 slices shown, 18 images]
[im 4/96  soft-tissue]
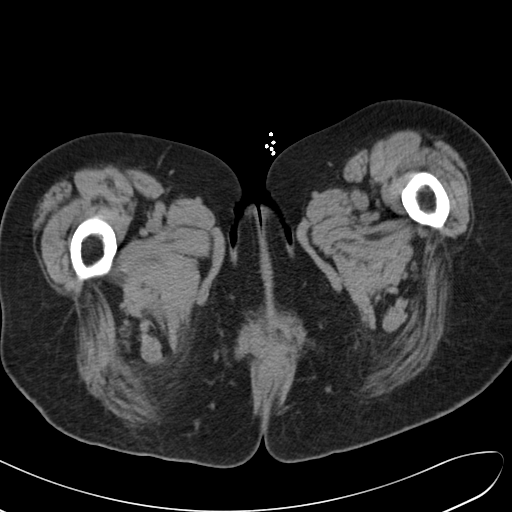
[im 4/96  bone]
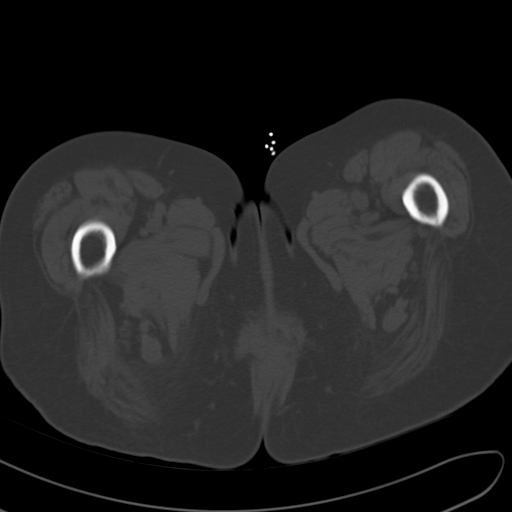
[im 12/96  soft-tissue]
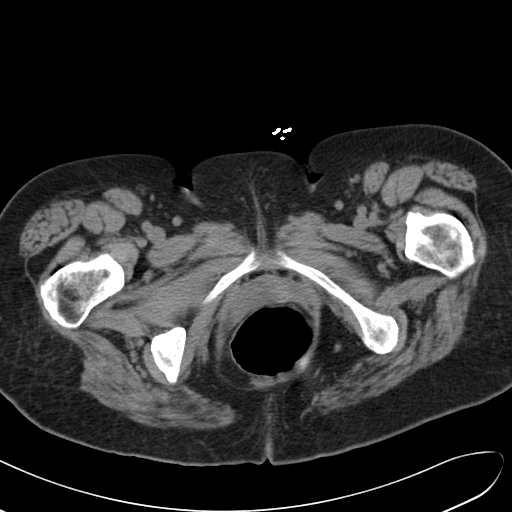
[im 20/96  soft-tissue]
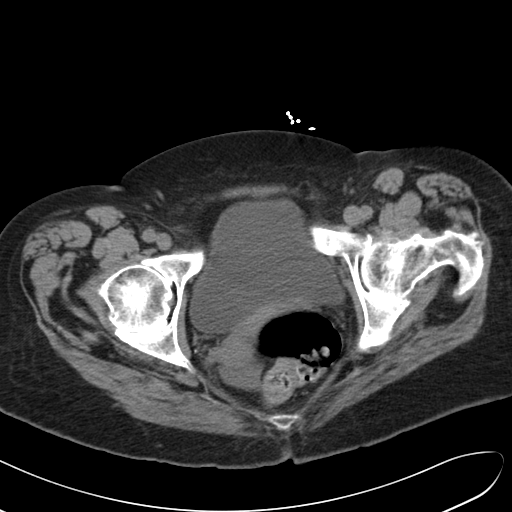
[im 28/96  soft-tissue]
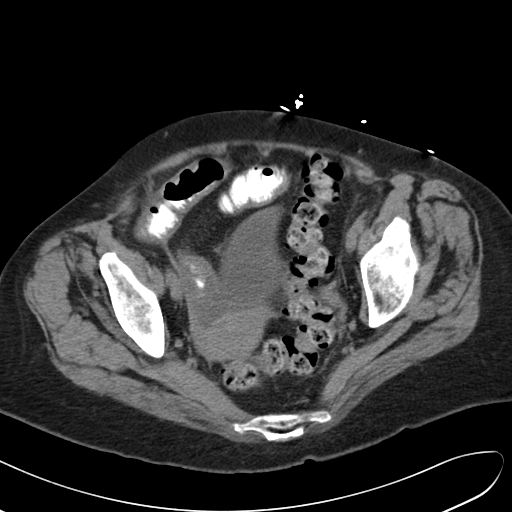
[im 36/96  soft-tissue]
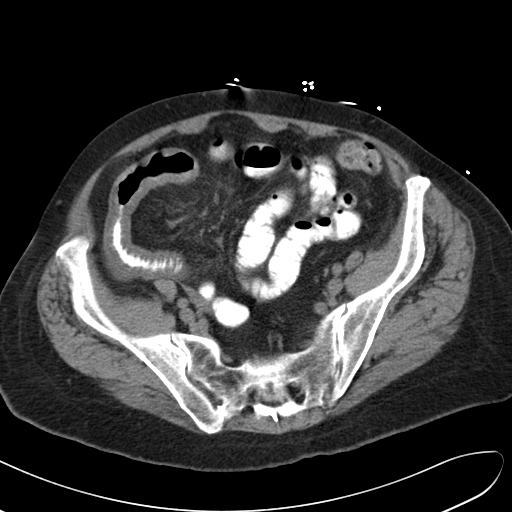
[im 44/96  soft-tissue]
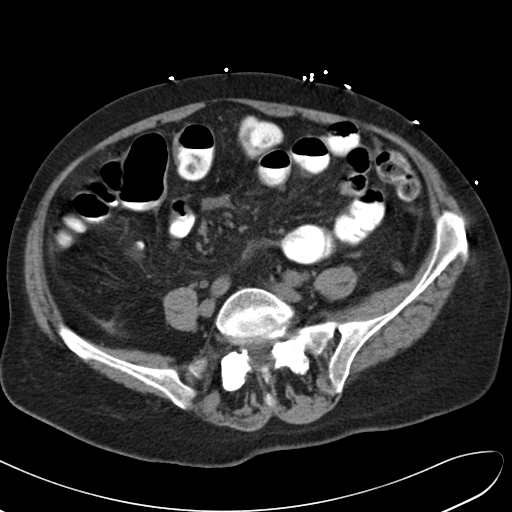
[im 52/96  soft-tissue]
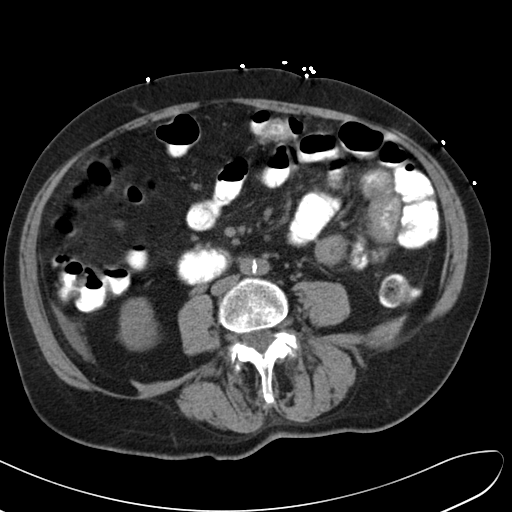
[im 60/96  soft-tissue]
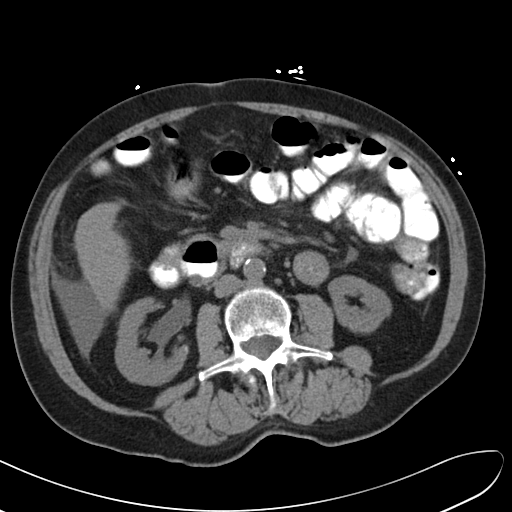
[im 68/96  soft-tissue]
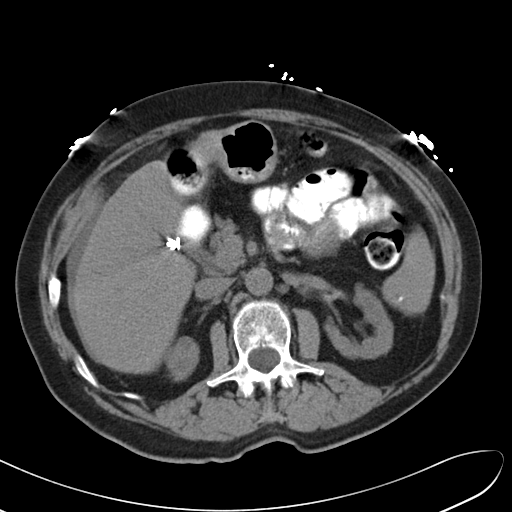
[im 68/96  bone]
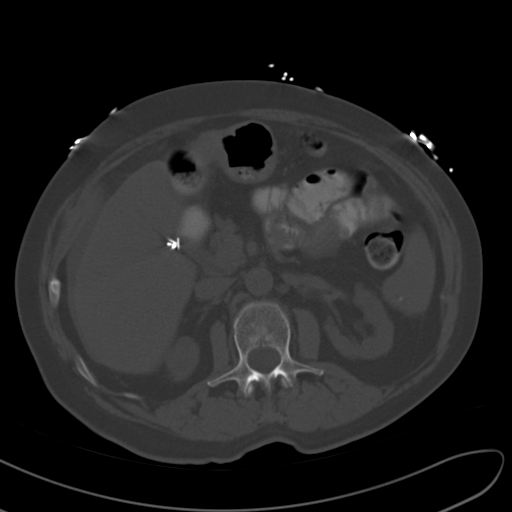
[im 76/96  soft-tissue]
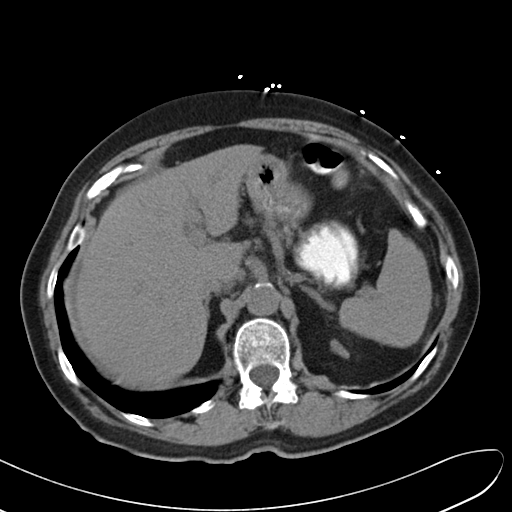
[im 80/96  lung]
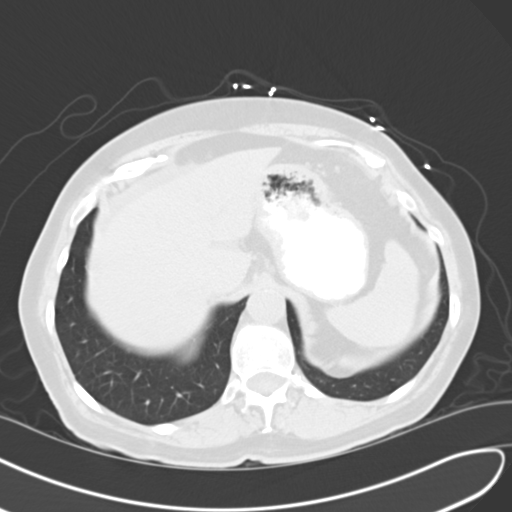
[im 84/96  soft-tissue]
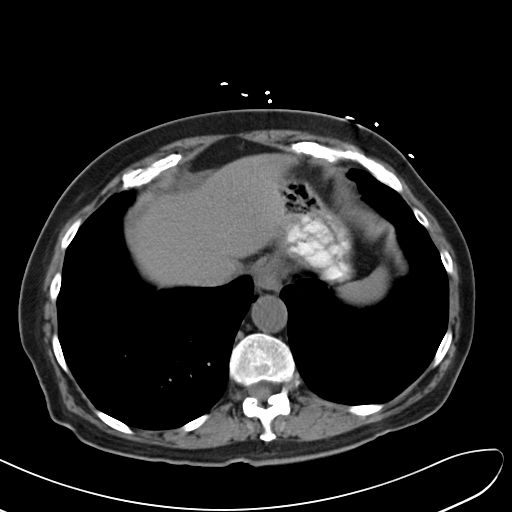
[im 84/96  lung]
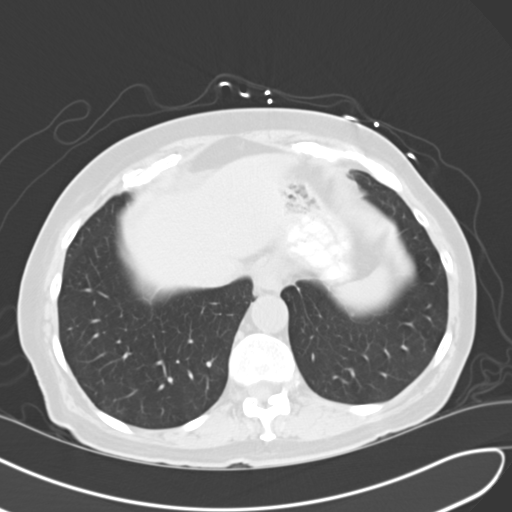
[im 88/96  lung]
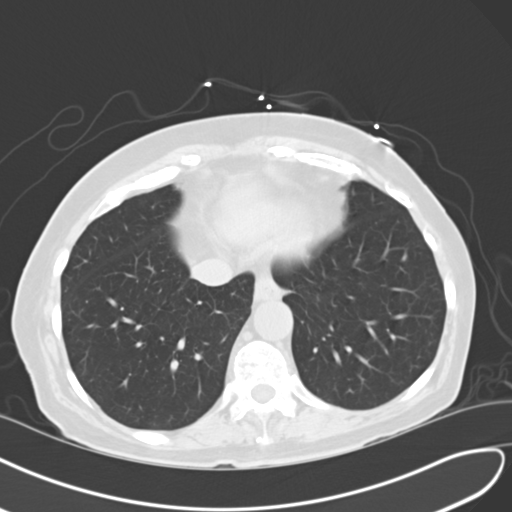
[im 92/96  soft-tissue]
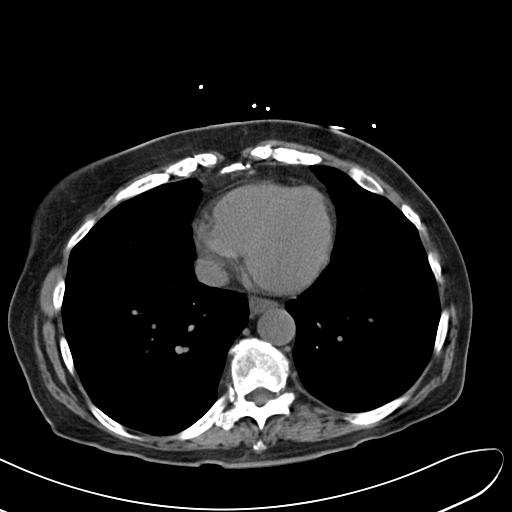
[im 92/96  lung]
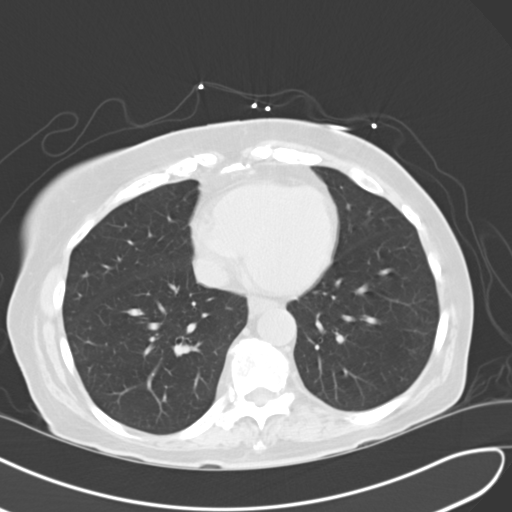

[14 of 32 positions shown; findings below may reference images not displayed]

FINDINGS: Mild emphysematous change at the lung bases. No consolidation. The
heart size is normal.

Clips in the gallbladder fossa from cholecystectomy. No evidence of
focal hepatic lesion allowing for lack contrast. There is a small
amount of perihepatic free fluid. Punctate granuloma in the spleen.
Spleen is normal in size. Mild pancreatic atrophy without ductal
dilatation or inflammatory change. Kidneys are symmetric in size
without hydronephrosis. Scattered renal parenchymal calcifications
and cysts, similar in appearance to prior exam. There is no
hydronephrosis.

Stomach is physiologically distended. Small hiatal hernia. There
multifocal regions of small bowel thickening and adjacent
inflammatory change. Moderate segment of small bowel inflammation in
the left upper quadrant of the abdomen. Moderate length segment of
diffuse small bowel thickening in the right lower quadrant. The
terminal ileum appears normal. There is associated mesenteric edema
related to the inflammatory process. No obstruction, oral contrast
is seen throughout the colon. There scattered distal colonic
diverticula without diverticulitis. No colonic wall thickening.

Proximal appendix is filled with oral contrast. Bullous fluid
density structure involving the distal appendix, measuring up to
cm. There is mild soft tissue inflammation in the right lower
quadrant, however this appears to be related to a small bowel
inflammation rather than appendiceal inflammation. This appears
increased in size from prior exam where it measured 19 mm. Mild
mesenteric edema and multiple mesenteric lymph nodes.

No retroperitoneal adenopathy. Abdominal aorta is normal in caliber.

Within the pelvis the urinary bladder is physiologically distended.
Uterus is slightly bulbous, may reflect underlying fibroids. The
ovaries are not discretely identified. Small volume of free fluid in
the pelvis.

There are no acute or suspicious osseous abnormalities. The bones
appear under mineralized. There is degenerative change in the lower
lumbar spine with primarily facet arthropathy. Degenerative change
of both hips.
IMPRESSION: 1. Multifocal small bowel enteritis involving left upper and right
lower quadrants. There is associated mesenteric edema and free
fluid. Findings may be infectious or inflammatory. No perforation.
2. Bulbous fluid-filled dilatation of the distal appendix measuring
up to 2.3 cm. This is progressed from prior exam. This is concerning
for appendiceal mucocele, and does not have the appearance of acute
appendicitis. Surgical consultation is recommended.
3. Chronic findings include postcholecystectomy, sequela of prior
granulomatous disease in the spleen, and multiple renal cysts and
calcifications.

## 2018-04-30 ENCOUNTER — Emergency Department
Admission: EM | Admit: 2018-04-30 | Discharge: 2018-04-30 | Disposition: A | Discharge status: Home / Self Care | Payer: Medicare Other | Attending: Emergency Medicine | Admitting: Emergency Medicine

## 2018-04-30 ENCOUNTER — Other Ambulatory Visit: Payer: Self-pay

## 2018-04-30 ENCOUNTER — Emergency Department: Payer: Medicare Other

## 2018-04-30 DIAGNOSIS — N3000 Acute cystitis without hematuria: Secondary | ICD-10-CM | POA: Diagnosis not present

## 2018-04-30 DIAGNOSIS — K388 Other specified diseases of appendix: Secondary | ICD-10-CM | POA: Insufficient documentation

## 2018-04-30 DIAGNOSIS — E039 Hypothyroidism, unspecified: Secondary | ICD-10-CM | POA: Insufficient documentation

## 2018-04-30 DIAGNOSIS — I129 Hypertensive chronic kidney disease with stage 1 through stage 4 chronic kidney disease, or unspecified chronic kidney disease: Secondary | ICD-10-CM | POA: Diagnosis not present

## 2018-04-30 DIAGNOSIS — N183 Chronic kidney disease, stage 3 (moderate): Secondary | ICD-10-CM | POA: Diagnosis not present

## 2018-04-30 DIAGNOSIS — Z87891 Personal history of nicotine dependence: Secondary | ICD-10-CM | POA: Diagnosis not present

## 2018-04-30 DIAGNOSIS — Z79899 Other long term (current) drug therapy: Secondary | ICD-10-CM | POA: Diagnosis not present

## 2018-04-30 DIAGNOSIS — R1031 Right lower quadrant pain: Secondary | ICD-10-CM | POA: Diagnosis present

## 2018-04-30 LAB — COMPREHENSIVE METABOLIC PANEL
ALT: 14 U/L (ref 0–44)
AST: 22 U/L (ref 15–41)
Albumin: 4.1 g/dL (ref 3.5–5.0)
Alkaline Phosphatase: 44 U/L (ref 38–126)
Anion gap: 9 (ref 5–15)
BUN: 18 mg/dL (ref 8–23)
CO2: 22 mmol/L (ref 22–32)
Calcium: 9.5 mg/dL (ref 8.9–10.3)
Chloride: 108 mmol/L (ref 98–111)
Creatinine, Ser: 1.72 mg/dL — ABNORMAL HIGH (ref 0.44–1.00)
GFR calc Af Amer: 32 mL/min — ABNORMAL LOW (ref >60)
GFR calc non Af Amer: 27 mL/min — ABNORMAL LOW (ref >60)
Glucose, Bld: 103 mg/dL — ABNORMAL HIGH (ref 70–99)
Potassium: 4.3 mmol/L (ref 3.5–5.1)
Sodium: 139 mmol/L (ref 135–145)
Total Bilirubin: 0.6 mg/dL (ref 0.3–1.2)
Total Protein: 7.3 g/dL (ref 6.5–8.1)

## 2018-04-30 LAB — URINALYSIS, COMPLETE (UACMP) WITH MICROSCOPIC
BILIRUBIN URINE: NEGATIVE
Bacteria, UA: NONE SEEN
Glucose, UA: NEGATIVE mg/dL
Ketones, ur: NEGATIVE mg/dL
Nitrite: NEGATIVE
Protein, ur: NEGATIVE mg/dL
Specific Gravity, Urine: 1.005 (ref 1.005–1.030)
WBC, UA: >50 WBC/hpf — ABNORMAL HIGH (ref 0–5)
pH: 6 (ref 5.0–8.0)

## 2018-04-30 LAB — CBC
HCT: 40.2 % (ref 36.0–46.0)
Hemoglobin: 13.4 g/dL (ref 12.0–15.0)
MCH: 26.4 pg (ref 26.0–34.0)
MCHC: 33.3 g/dL (ref 30.0–36.0)
MCV: 79.3 fL — ABNORMAL LOW (ref 80.0–100.0)
Platelets: 190 10*3/uL (ref 150–400)
RBC: 5.07 MIL/uL (ref 3.87–5.11)
RDW: 13.9 % (ref 11.5–15.5)
WBC: 8.4 10*3/uL (ref 4.0–10.5)
nRBC: 0 % (ref 0.0–0.2)

## 2018-04-30 LAB — LIPASE, BLOOD: Lipase: 54 U/L — ABNORMAL HIGH (ref 11–51)

## 2018-04-30 MED ORDER — CEPHALEXIN 250 MG PO CAPS: 250.0000 mg | ORAL_CAPSULE | Freq: Two times a day (BID) | ORAL | 0 refills | Status: AC

## 2018-04-30 MED ORDER — SODIUM CHLORIDE 0.9 % IV SOLN
1.0000 g | Freq: Once | INTRAVENOUS | Status: AC
  Administered 2018-04-30: 1 g via INTRAVENOUS
  Filled 2018-04-30: qty 10

## 2018-04-30 MED ORDER — IOPAMIDOL (ISOVUE-300) INJECTION 61%
15.0000 mL | INTRAVENOUS | Status: AC
  Administered 2018-04-30 (×2): 15 mL via ORAL

## 2018-04-30 NOTE — Consult Note (Signed)
SURGICAL CONSULTATION NOTE   HISTORY OF PRESENT ILLNESS (HPI):  84 y.o. female presented to [facility] ED for evaluation of abdominal pain. Patient reports that this episode of abdominal pain started 2 days ago but she has been having this episodes for years. She refers pain is no the lower quadrants. Pain does not radiates. She denies nausea and vomiting. Last meal was breakfast. There is no aggravator or alleviator factor, pain just comes and goes.  Patient has complex psychiatric history of bipolar disorder with multiple admission to behavioral institutions. Of note, I reviewed her chart at [facility] and [place]. On 2016 she was admitted with enteritis. 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 It was recommended at that time to treat the enteritis and perform appendectomy at a later time. On July 2016, Dr. Pat Patrick followed the patient as outpatient and recommended appendectomy but the patient did not consented at that moment. After that, she had multiple admission due to psychiatric disorders. She was also evaluated by Dr. Gustavo Lah at that moment. Upper endoscopy was done showing esophagitis. I was not able to found any recent colonoscopy. As per Dr. Gustavo Lah evaluation, the last colonoscopy was done in 2011 with finding of diverticulosis. No other colonoscopy was found. When asked, the patient refers that she has never been told about an appendix mass. The son also denies knowing about this mass. They refers that they were told that she had infection of her intestine. I discussed with them the evaluation by Dr. Pat Patrick talking to the patient about the mass and about his recommendations of surgery   Surgery is consulted by Dr. Jacqualine Code in this context for evaluation and management of appendiceal mass.  PAST MEDICAL HISTORY (PMH):  Past Medical History:  Diagnosis Date  . Bipolar 1 disorder ([place])   . Chronic kidney disease    stage 3  . Chronic kidney disease   . Chronic mitral valve disease   . Coronary artery  disease   . Deviated nasal septum   . Hypertension   . Murmur, cardiac   . 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 Renal cyst, left    bilateral  . Seasonal allergies      PAST SURGICAL HISTORY ([place]):  Past Surgical History:  Procedure Laterality Date  . CARDIAC CATHETERIZATION    . CHOLECYSTECTOMY    . ESOPHAGOGASTRODUODENOSCOPY (EGD) WITH PROPOFOL N/A 10/20/2014   Procedure: ESOPHAGOGASTRODUODENOSCOPY (EGD) WITH PROPOFOL;  Surgeon: [name], MD;  Location: [facility] ENDOSCOPY;  Service: Endoscopy;  Laterality: N/A;  . knee replacement, right    . THYROIDECTOMY       MEDICATIONS:  Prior to Admission medications   Medication Sig Start Date End Date Taking? Authorizing Provider  ALPRAZolam Duanne Moron) 0.25 MG tablet Take 1 tablet by mouth at bedtime as needed. 10/03/14   [provider]  ARIPiprazole (ABILIFY) 2 MG tablet Take 2 mg by mouth daily.    [provider]  buPROPion (WELLBUTRIN SR) 100 MG 12 hr tablet Take 100 mg by mouth 2 (two) times daily.  11/03/14   [provider]  Calcium Carbonate Antacid 400 MG CHEW Chew 1 tablet by mouth daily as needed.     [provider]  cholecalciferol (VITAMIN D) 1000 UNITS tablet Take 1 tablet (1,000 Units total) by mouth daily. 10/22/14   Fritzi Mandes, MD  enalapril (VASOTEC) 2.5 MG tablet Take 2.5 mg by mouth daily.  10/23/14   [provider]  furosemide (LASIX) 20 MG tablet Take 20 mg by  mouth daily.  04/05/15   [provider]  HYDROcodone-acetaminophen (NORCO/VICODIN) 5-325 MG per tablet Take 1 tablet by mouth every 6 (six) hours as needed.     [provider]  lamoTRIgine (LAMICTAL) 100 MG tablet Take 1 tablet by mouth at bedtime.  09/07/14   [provider]  latanoprost (XALATAN) 0.005 % ophthalmic solution Place 1 drop into both eyes at bedtime. 08/29/14   [provider]  levothyroxine (SYNTHROID, LEVOTHROID) 100 MCG tablet Take 100 mcg by mouth daily before  breakfast.  10/30/14 10/30/15  [provider]  lovastatin (MEVACOR) 40 MG tablet Take 1 tablet (40 mg total) by mouth nightly. 12/06/14   [provider]  meclizine (ANTIVERT) 25 MG tablet Take 1 tablet (25 mg total) by mouth 3 (three) times daily as needed for dizziness. 05/22/15   Nance Pear, MD  ondansetron (ZOFRAN) 4 MG tablet TAKE ONE TABLET BY MOUTH EVERY 4 HOURS AS NEEDED FOR NAUSEA 02/06/15   [provider]  Oxcarbazepine (TRILEPTAL) 300 MG tablet Take 300 mg by mouth daily.    [provider]  pantoprazole (PROTONIX) 40 MG tablet Take 40 mg by mouth daily.     [provider]  risperiDONE (RISPERDAL) 0.25 MG tablet Take 0.25 mg by mouth at bedtime.     [provider]  temazepam (RESTORIL) 15 MG capsule TAKE ONE CAPSULE BY MOUTH AT BEDTIME FOR INSOMNIA 02/06/15   [provider]  venlafaxine XR (EFFEXOR-XR) 75 MG 24 hr capsule Take 225 mg by mouth daily. 03/20/15   [provider]  vitamin B-12 (CYANOCOBALAMIN) 1000 MCG tablet Take 1,000 mcg by mouth daily.    [provider]     ALLERGIES:  Allergies  Allergen Reactions  . Aspirin Other (See Comments)    Reaction: Rectal bleeding  . Codeine Nausea And Vomiting  . Prednisone Other (See Comments)    Reaction: Hallucinations  . Valium [Diazepam] Other (See Comments)    Strange feelings. Reaction: Strange feelings     SOCIAL HISTORY:  Social History   Socioeconomic History  . Marital status: Widowed    Spouse name: Not on file  . Number of children: Not on file  . Years of education: Not on file  . Highest education level: Not on file  Occupational History  . Not on file  Social Needs  . Financial resource strain: Not on file  . Food insecurity:    Worry: Not on file    Inability: Not on file  . Transportation needs:    Medical: Not on file    Non-medical: Not on file  Tobacco Use  . Smoking status: Former Research scientist (life sciences)  . Smokeless tobacco:  Never Used  . Tobacco comment: quit 1964  Substance and Sexual Activity  . Alcohol use: No    Alcohol/week: 0.0 standard drinks  . Drug use: No  . Sexual activity: Never  Lifestyle  . Physical activity:    Days per week: Not on file    Minutes per session: Not on file  . Stress: Not on file  Relationships  . Social connections:    Talks on phone: Not on file    Gets together: Not on file    Attends religious service: Not on file    Active member of club or organization: Not on file    Attends meetings of clubs or organizations: Not on file    Relationship status: Not on file  . Intimate partner violence:    Fear  of current or ex partner: Not on file    Emotionally abused: Not on file    Physically abused: Not on file    Forced sexual activity: Not on file  Other Topics Concern  . Not on file  Social History Narrative  . Not on file    The patient currently resides (home / rehab facility / nursing home): Home The patient normally is (ambulatory / bedbound): Ambulatory   FAMILY HISTORY:  Family History  Problem Relation Age of Onset  . Heart Problems Mother   . Seizures Mother   . Stroke Mother   . Diverticulitis Mother   . Heart attack Father   . Heart attack Sister   . Pancreatitis Sister   . Cancer Sister   . Cancer Brother      REVIEW OF SYSTEMS:  Constitutional: denies weight loss, fever, chills, or sweats  Eyes: denies any other vision changes, history of eye injury  ENT: denies sore throat, hearing problems  Respiratory: denies shortness of breath, wheezing  Cardiovascular: denies chest pain, palpitations  Gastrointestinal: positive abdominal pain, Denies Nausea or vomiting. Positive for constipation.  Genitourinary: denies burning with urination or urinary frequency Musculoskeletal: denies any other joint pains or cramps  Skin: denies any other rashes or skin discolorations  Neurological: denies any other headache, dizziness, weakness  Psychiatric:  positive for depression, anxiety   All other review of systems were negative   VITAL SIGNS:  Temp:  [98.3 F (36.8 C)] 98.3 F (36.8 C) (12/13 1207) Pulse Rate:  [74] 74 (12/13 1207) Resp:  [18] 18 (12/13 1207) BP: (125)/(74) 125/74 (12/13 1207) SpO2:  [97 %] 97 % (12/13 1207) Weight:  [82.1 kg] 82.1 kg (12/13 1207)     Height: 5\' 7"  (170.2 cm) Weight: 82.1 kg BMI (Calculated): 28.34   INTAKE/OUTPUT:  This shift: Total I/O In: 99.3 [IV Piggyback:99.3] Out: -   Last 2 shifts: @IOLAST2SHIFTS @   PHYSICAL EXAM:  Constitutional:  -- Normal body habitus  -- Awake, alert, and oriented x3  Eyes:  -- Pupils equally round and reactive to light  -- No scleral icterus  Ear, nose, and throat:  -- No jugular venous distension  Pulmonary:  -- No crackles  -- Equal breath sounds bilaterally -- Breathing non-labored at rest Cardiovascular:  -- S1, S2 present  -- No pericardial rubs Gastrointestinal:  -- Abdomen soft, nontender, non-distended, no guarding or rebound tenderness -- No abdominal masses appreciated, pulsatile or otherwise  Musculoskeletal and Integumentary:  -- Wounds or skin discoloration: None appreciated -- Extremities: B/L UE and LE FROM, hands and feet warm, no edema  Neurologic:  -- Motor function: intact and symmetric -- Sensation: intact and symmetric   Labs:  CBC Latest Ref Rng & Units 04/30/2018 07/07/2015 05/22/2015  WBC 4.0 - 10.5 K/uL 8.4 8.5 9.2  Hemoglobin 12.0 - 15.0 g/dL 13.4 14.2 11.8(L)  Hematocrit 36.0 - 46.0 % 40.2 42.1 35.5  Platelets 150 - 400 K/uL 190 164 158   CMP Latest Ref Rng & Units 04/30/2018 07/07/2015 05/22/2015  Glucose 70 - 99 mg/dL 103(H) 86 144(H)  BUN 8 - 23 mg/dL 18 30(H) 19  Creatinine 0.44 - 1.00 mg/dL 1.72(H) 1.49(H) 1.47(H)  Sodium 135 - 145 mmol/L 139 140 138  Potassium 3.5 - 5.1 mmol/L 4.3 4.0 3.6  Chloride 98 - 111 mmol/L 108 106 106  CO2 22 - 32 mmol/L 22 21(L) 25  Calcium 8.9 - 10.3 mg/dL 9.5 9.5 9.0  Total Protein 6.5  -  8.1 g/dL 7.3 7.4 -  Total Bilirubin 0.3 - 1.2 mg/dL 0.6 1.2 -  Alkaline Phos 38 - 126 U/L 44 54 -  AST 15 - 41 U/L 22 18 -  ALT 0 - 44 U/L 14 11(L) -   Imaging studies: (images personally evaluated) EXAM: CT ABDOMEN AND PELVIS WITHOUT CONTRAST  TECHNIQUE: Multidetector CT imaging of the abdomen and pelvis was performed following the standard protocol without IV contrast.  COMPARISON: CT Abdomen and Pelvis 10/17/2014 and earlier.  FINDINGS: Lower chest: Negative. No pericardial or pleural effusion.  Hepatobiliary: Negative noncontrast liver; tiny calcified granuloma in the posterior right lobe is unchanged. Surgically absent gallbladder.  Pancreas: Negative, no pancreatic inflammation is evident.  Spleen: Negative, scattered small calcified granulomas are stable.  Adrenals/Urinary Tract: Normal adrenal glands.  Stable noncontrast appearance of both kidneys since 2016. Small exophytic left upper pole cyst with simple fluid density. No convincing collecting system stone. Negative course of the right ureter. Diminutive and unremarkable urinary bladder.  Stomach/Bowel: Oral contrast was administered. There is diverticulosis in the rectum and the sigmoid colon with no active inflammation evident. Mild retained stool in the sigmoid and descending colon with mild redundancy. Similar retained stool in redundant transverse colon. Oral contrast has reached the mid transverse.  Chronically abnormal appearance of the appendix which is dilated and contains low-density material. The appendix was dilated up to 2.3 centimeters in 2016, now 3.2 centimeters. The wall may be partially calcified. No surrounding acute inflammation, although the tip or medial wall may be adhered to regional small bowel as seen on coronal image 42.  The terminal ileum distal to the adhesion is normal. There is no distal small bowel dilatation or stricture. The other small bowel loops appear negative.  Small gastric hiatal hernia, but negative intraabdominal stomach. Duodenal diverticulum measuring 3 centimeters at the junction of the 2nd and 3rd portions. No regional inflammation.  No free air, free fluid.  Vascular/Lymphatic: Aortoiliac calcified atherosclerosis. Vascular patency is not evaluated in the absence of IV contrast.  No lymphadenopathy.  Reproductive: Negative noncontrast appearance.  Other: No pelvic free fluid.  Musculoskeletal: No acute osseous abnormality identified. Chronic asymmetric right hip degeneration.  IMPRESSION: 1. 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 There could be an underlying mucinous cystadenocarcinoma, and rupture of this lesion could result in pseudomyxoma peritonei. The lesion may be adhesed to regional small bowel loops, but there is no associated bowel obstruction. Recommend Surgery consultation. 2. No superimposed acute or inflammatory process identified in the noncontrast abdomen or pelvis. 3. Aortic Atherosclerosis (ICD10-I70.0).   Electronically Signed By: Genevie Ann M.D. On: 04/30/2018 14:57  Assessment/Plan:  84 y.o. 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 Patient present with a known mass on the appendix since at least more than three years ago (May 2016). As per new CT scan the mass has increased from 2 cm to 3 cm. There is no sign of obstruction or peri appendical inflammation. I had a long discussion with the patient and the son about this mass and the recommendations of surgical management that will be most likely a right colectomy. The patient has never had a proper workup for this mass before. It seem that she was lost in the follow up of the  mass after she saw Dr. Pat Patrick and he recommended follow  up 3 months after his visit with a new CT scan. This may be due to the multiple behavioral admissions. The patient is willing to have complete workup and evaluation for potential surgical management of the mass. She needs at least a colonoscopy to rule out synchronous lesion. Also complete staging will need to be done and will also needs medical clearance. Will discuss case with Dr. Gustavo Lah and Dr. Netty Starring for pre operative evaluation.  All of the above findings and recommendations were discussed with the patient and her son, and all questions were answered to their expressed satisfaction. From the appendix mass standpoint that has been there for more than 3 years, patient can be discharged if medically stable.   Arnold Long, MD

## 2018-04-30 NOTE — ED Provider Notes (Signed)
[facility] Emergency Department Provider Note   ____________________________________________   First MD Initiated Contact with Patient 04/30/18 1247     (approximate)  I have reviewed the triage vital signs and the nursing notes.   HISTORY  Chief Complaint Abdominal Pain and Dysuria    HPI Stephanie Anthony is a 84 y.o. female here for evaluation of right-sided abdominal pain.  She reports she has a mild discomfort in her lower right abdomen, but for 3 days also experienced burning and slight urge to urinate.  She does report she had urinary tract infections in the distant past.  No fevers or chills.  No nausea vomiting.  Reports the pain is very mild, does not wish for any pain medicine or anything for discomfort at present.  Still eating and drinking well.  Went to urgent care and was referred for further evaluation given the focality of discomfort in the right lower abdomen  She is accompanied by her son who is also a very pleasant.  She had a previous cholecystectomy   Past Medical History:  Diagnosis Date  . Bipolar 1 disorder ([place])   . Chronic kidney disease    stage 3  . Chronic kidney disease   . Chronic mitral valve disease   . Coronary artery disease   . Deviated nasal septum   . Hypertension   . Murmur, cardiac   . 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 Renal cyst, left    bilateral  . Seasonal allergies     Patient Active Problem List   Diagnosis Date Noted  . Anorexia 07/09/2015  . Severe bipolar I disorder with depression ([place]) 11/16/2014  . Enteritis 10/17/2014  . Gastroenteritis, non-infectious 10/17/2014  . Chronic kidney disease (CKD), stage III (moderate) ([place]) 09/27/2014  . 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  . H/O nutritional disorder 09/27/2014  . HLD (hyperlipidemia) 09/27/2014  . Heart valve incompetence 09/27/2014  . Mitral and aortic incompetence 09/27/2014  . Personal history of  other endocrine, nutritional and metabolic disease 82/50/5397  . Hypothyroidism, postop 09/27/2014  . Bipolar disorder, current episode depressed, severe, without psychotic features ([place])   . Bipolar affective disorder, current episode depressed ([place]) 09/25/2014  . Bipolar I disorder, most recent episode depressed ([place]) 09/25/2014  . Chronic kidney disease, stage IV (severe) ([place]) 09/19/2014  . Pure hypercholesterolemia 09/19/2014  . Acquired hypothyroidism 03/29/2014  . Bipolar affective disorder ([place]) 03/29/2014  . Bradycardia 03/29/2014  . Essential (primary) hypertension 03/29/2014  . MI (mitral incompetence) 03/29/2014  . Acquired cyst of kidney 06/18/2012    Past Surgical History:  Procedure Laterality Date  . CARDIAC CATHETERIZATION    . CHOLECYSTECTOMY    . ESOPHAGOGASTRODUODENOSCOPY (EGD) WITH PROPOFOL N/A 10/20/2014   Procedure: ESOPHAGOGASTRODUODENOSCOPY (EGD) WITH PROPOFOL;  Surgeon: [name], MD;  Location: [facility] ENDOSCOPY;  Service: Endoscopy;  Laterality: N/A;  . knee replacement, right    . THYROIDECTOMY      Prior to Admission medications   Medication Sig Start Date End Date Taking? Authorizing Provider  ALPRAZolam Duanne Moron) 0.25 MG tablet Take 1 tablet by mouth at bedtime as needed. 10/03/14   [provider]  ARIPiprazole (ABILIFY) 2 MG tablet Take 2 mg by mouth daily.    [provider]  buPROPion (WELLBUTRIN SR) 100 MG 12 hr tablet Take 100 mg by mouth 2 (two) times daily.  11/03/14   [provider]  Calcium Carbonate Antacid 400 MG CHEW Chew 1 tablet by mouth daily as  needed.     [provider]  cephALEXin (KEFLEX) 250 MG capsule Take 1 capsule (250 mg total) by mouth 2 (two) times daily for 7 days. 04/30/18 05/07/18  Delman Kitten, MD  cholecalciferol (VITAMIN D) 1000 UNITS tablet Take 1 tablet (1,000 Units total) by mouth daily. 10/22/14   Fritzi Mandes, MD  enalapril (VASOTEC) 2.5 MG tablet Take 2.5 mg by mouth daily.  10/23/14    [provider]  furosemide (LASIX) 20 MG tablet Take 20 mg by mouth daily.  04/05/15   [provider]  HYDROcodone-acetaminophen (NORCO/VICODIN) 5-325 MG per tablet Take 1 tablet by mouth every 6 (six) hours as needed.     [provider]  lamoTRIgine (LAMICTAL) 100 MG tablet Take 1 tablet by mouth at bedtime.  09/07/14   [provider]  latanoprost (XALATAN) 0.005 % ophthalmic solution Place 1 drop into both eyes at bedtime. 08/29/14   [provider]  levothyroxine (SYNTHROID, LEVOTHROID) 100 MCG tablet Take 100 mcg by mouth daily before breakfast.  10/30/14 10/30/15  [provider]  lovastatin (MEVACOR) 40 MG tablet Take 1 tablet (40 mg total) by mouth nightly. 12/06/14   [provider]  meclizine (ANTIVERT) 25 MG tablet Take 1 tablet (25 mg total) by mouth 3 (three) times daily as needed for dizziness. 05/22/15   Nance Pear, MD  ondansetron (ZOFRAN) 4 MG tablet TAKE ONE TABLET BY MOUTH EVERY 4 HOURS AS NEEDED FOR NAUSEA 02/06/15   [provider]  Oxcarbazepine (TRILEPTAL) 300 MG tablet Take 300 mg by mouth daily.    [provider]  pantoprazole (PROTONIX) 40 MG tablet Take 40 mg by mouth daily.     [provider]  risperiDONE (RISPERDAL) 0.25 MG tablet Take 0.25 mg by mouth at bedtime.     [provider]  temazepam (RESTORIL) 15 MG capsule TAKE ONE CAPSULE BY MOUTH AT BEDTIME FOR INSOMNIA 02/06/15   [provider]  venlafaxine XR (EFFEXOR-XR) 75 MG 24 hr capsule Take 225 mg by mouth daily. 03/20/15   [provider]  vitamin B-12 (CYANOCOBALAMIN) 1000 MCG tablet Take 1,000 mcg by mouth daily.    [provider]    Allergies Aspirin; Codeine; Prednisone; and Valium [diazepam]  Family History  Problem Relation Age of Onset  . Heart Problems Mother   . Seizures Mother   . Stroke Mother   . Diverticulitis Mother   . Heart attack Father   . Heart attack  Sister   . Pancreatitis Sister   . Cancer Sister   . Cancer Brother     Social History Social History   Tobacco Use  . Smoking status: Former Research scientist (life sciences)  . Smokeless tobacco: Never Used  . Tobacco comment: quit 1964  Substance Use Topics  . Alcohol use: No    Alcohol/week: 0.0 standard drinks  . Drug use: No    Review of Systems Constitutional: No fever/chills Eyes: No visual changes. ENT: No sore throat. Cardiovascular: Denies chest pain. Respiratory: Denies shortness of breath. Gastrointestinal: See HPI.  No pain in her back Genitourinary: Negative for dysuria. Musculoskeletal: Negative for back pain. Skin: Negative for rash. Neurological: Negative for headaches, areas of focal weakness or numbness.    ____________________________________________   PHYSICAL EXAM:  VITAL SIGNS: ED Triage Vitals [04/30/18 1207]  Enc Vitals Group     BP 125/74     Pulse Rate 74     Resp 18     Temp 98.3 F (36.8 C)  Temp Source Oral     SpO2 97 %     Weight 181 lb (82.1 kg)     Height 5\' 7"  (1.702 m)     Head Circumference      Peak Flow      Pain Score 7     Pain Loc      Pain Edu?      Excl. in [name]?     Constitutional: Alert and oriented. Well appearing and in no acute distress. Eyes: Conjunctivae are normal. Head: Atraumatic. Nose: No congestion/rhinnorhea. Mouth/Throat: Mucous membranes are moist. Neck: No stridor.  Cardiovascular: Normal rate, regular rhythm. Grossly normal heart sounds.  Good peripheral circulation. Respiratory: Normal respiratory effort.  No retractions. Lungs CTAB. Gastrointestinal: Soft and nontender except for focal discomfort without rebound or guarding in the right lower quadrant, it is moderate in intensity without any associated mass or lesions. No distention.  There is no CVA tenderness bilateral. Musculoskeletal: No lower extremity tenderness nor edema. Neurologic:  Normal speech and language. No gross focal neurologic deficits are  appreciated.  Skin:  Skin is warm, dry and intact. No rash noted. Psychiatric: Mood and affect are normal. Speech and behavior are normal.  ____________________________________________   LABS (all labs ordered are listed, but only abnormal results are displayed)  Labs Reviewed  LIPASE, BLOOD - Abnormal; Notable for the following components:      Result Value   Lipase 54 (*)    All other components within normal limits  COMPREHENSIVE METABOLIC PANEL - Abnormal; Notable for the following components:   Glucose, Bld 103 (*)    Creatinine, Ser 1.72 (*)    GFR calc non Af Amer 27 (*)    GFR calc Af Amer 32 (*)    All other components within normal limits  CBC - Abnormal; Notable for the following components:   MCV 79.3 (*)    All other components within normal limits  URINALYSIS, COMPLETE (UACMP) WITH MICROSCOPIC - Abnormal; Notable for the following components:   Color, Urine YELLOW (*)    APPearance CLOUDY (*)    Hgb urine dipstick MODERATE (*)    Leukocytes, UA LARGE (*)    WBC, UA >50 (*)    All other components within normal limits  URINE CULTURE   ____________________________________________  EKG   ____________________________________________  RADIOLOGY    CT scan results reviewed, also discussed with Dr. Windell Moment   ____________________________________________   PROCEDURES  Procedure(s) performed: None  Procedures  Critical Care performed: No  ____________________________________________   INITIAL IMPRESSION / ASSESSMENT AND PLAN / ED COURSE  Pertinent labs & imaging results that were available during my care of the patient were reviewed by me and considered in my medical decision making (see chart for details).   Differential diagnosis includes but is not limited to, abdominal perforation, aortic dissection, cholecystitis, appendicitis, diverticulitis, colitis, esophagitis/gastritis, kidney stone, pyelonephritis, urinary tract infection, aortic  aneurysm. All are considered in decision and treatment plan. Based upon the patient's presentation and risk factors, I most suspect this is a urinary tract infection.  Review of her urinalysis demonstrates positive leukocytes and many white blood cells.  My pretest probability for appendicitis is relatively low, but given the focality of her discomfort in her right lower abdomen we will proceed with CT scan with oral contrast only.  Her GFR is at her approximate baseline, she follows with nephrology and her creatinines have been about 1.5-1.7 historically.  She overall appears quite well, normal vital signs.  Normal  alertness.     440pm, discussed and place ED consult to Dr. Peyton Najjar at this time.   After consultation, Dr. Windell Moment.  He advises that patient will need close outpatient follow-up, but today's findings do not warrant admission or further inpatient work-up.  We will treat for urinary tract infection and patient is agreeable with following up with her GI physician Dr. Gustavo Lah as well as general surgery in the near future.  Return precautions and treatment recommendations and follow-up discussed with the patient who is agreeable with the plan.  ____________________________________________   FINAL CLINICAL IMPRESSION(S) / ED DIAGNOSES  Final diagnoses:  Acute cystitis without hematuria  Mass of appendix        Note:  This document was prepared using Dragon voice recognition software and may include unintentional dictation errors       Delman Kitten, MD 04/30/18 2124

## 2018-04-30 NOTE — Discharge Instructions (Addendum)
Follow-up closely with Dr. Peyton Najjar and Dr. Gustavo Lah.

## 2018-04-30 NOTE — ED Triage Notes (Signed)
Pt states that she has had dysuria for the past 4-5 days and now is having rlq pain, pt states that she thinks she may have a uti due to her s/s

## 2018-04-30 NOTE — ED Notes (Signed)
Admitting surgeon at bedside

## 2018-04-30 NOTE — ED Triage Notes (Signed)
First Nurse Note:  Arrives from [facility] for evaluation of RLQ pain x 2 days.  Sent to ED for evaluation.

## 2018-05-01 LAB — URINE CULTURE
Culture: NO GROWTH
Special Requests: NORMAL

## 2018-05-17 ENCOUNTER — Other Ambulatory Visit: Payer: Self-pay | Admitting: Gastroenterology

## 2018-05-17 DIAGNOSIS — R1310 Dysphagia, unspecified: Secondary | ICD-10-CM

## 2018-05-21 ENCOUNTER — Ambulatory Visit
Admission: RE | Admit: 2018-05-21 | Discharge: 2018-05-21 | Disposition: A | Discharge status: Home / Self Care | Payer: Medicare Other | Source: Ambulatory Visit | Attending: Gastroenterology | Admitting: Gastroenterology

## 2018-05-21 DIAGNOSIS — R1310 Dysphagia, unspecified: Secondary | ICD-10-CM | POA: Diagnosis not present

## 2018-05-31 ENCOUNTER — Encounter: Payer: Self-pay | Admitting: Student

## 2018-06-01 ENCOUNTER — Ambulatory Visit: Payer: Medicare Other | Admitting: Anesthesiology

## 2018-06-01 ENCOUNTER — Ambulatory Visit
Admission: RE | Admit: 2018-06-01 | Discharge: 2018-06-01 | Disposition: A | Discharge status: Home / Self Care | Payer: Medicare Other | Attending: Gastroenterology | Admitting: Gastroenterology

## 2018-06-01 ENCOUNTER — Encounter
Admission: RE | Disposition: A | Discharge status: Home / Self Care | Payer: Self-pay | Source: Home / Self Care | Attending: Gastroenterology

## 2018-06-01 DIAGNOSIS — K2980 Duodenitis without bleeding: Secondary | ICD-10-CM | POA: Diagnosis not present

## 2018-06-01 DIAGNOSIS — K449 Diaphragmatic hernia without obstruction or gangrene: Secondary | ICD-10-CM | POA: Insufficient documentation

## 2018-06-01 DIAGNOSIS — Z79899 Other long term (current) drug therapy: Secondary | ICD-10-CM | POA: Diagnosis not present

## 2018-06-01 DIAGNOSIS — D124 Benign neoplasm of descending colon: Secondary | ICD-10-CM | POA: Insufficient documentation

## 2018-06-01 DIAGNOSIS — I2510 Atherosclerotic heart disease of native coronary artery without angina pectoris: Secondary | ICD-10-CM | POA: Diagnosis not present

## 2018-06-01 DIAGNOSIS — K222 Esophageal obstruction: Secondary | ICD-10-CM | POA: Insufficient documentation

## 2018-06-01 DIAGNOSIS — E039 Hypothyroidism, unspecified: Secondary | ICD-10-CM | POA: Diagnosis not present

## 2018-06-01 DIAGNOSIS — K224 Dyskinesia of esophagus: Secondary | ICD-10-CM | POA: Insufficient documentation

## 2018-06-01 DIAGNOSIS — I129 Hypertensive chronic kidney disease with stage 1 through stage 4 chronic kidney disease, or unspecified chronic kidney disease: Secondary | ICD-10-CM | POA: Diagnosis not present

## 2018-06-01 DIAGNOSIS — M1990 Unspecified osteoarthritis, unspecified site: Secondary | ICD-10-CM | POA: Insufficient documentation

## 2018-06-01 DIAGNOSIS — N189 Chronic kidney disease, unspecified: Secondary | ICD-10-CM | POA: Diagnosis not present

## 2018-06-01 DIAGNOSIS — K2950 Unspecified chronic gastritis without bleeding: Secondary | ICD-10-CM | POA: Diagnosis not present

## 2018-06-01 DIAGNOSIS — E7800 Pure hypercholesterolemia, unspecified: Secondary | ICD-10-CM | POA: Insufficient documentation

## 2018-06-01 DIAGNOSIS — K5730 Diverticulosis of large intestine without perforation or abscess without bleeding: Secondary | ICD-10-CM | POA: Diagnosis not present

## 2018-06-01 DIAGNOSIS — F319 Bipolar disorder, unspecified: Secondary | ICD-10-CM | POA: Insufficient documentation

## 2018-06-01 HISTORY — DX: Unspecified cataract: H26.9

## 2018-06-01 HISTORY — DX: Benign neoplasm of connective and other soft tissue, unspecified: D21.9

## 2018-06-01 HISTORY — PX: ESOPHAGOGASTRODUODENOSCOPY (EGD) WITH PROPOFOL: SHX5813

## 2018-06-01 HISTORY — DX: Pure hypercholesterolemia, unspecified: E78.00

## 2018-06-01 HISTORY — DX: Hypothyroidism, unspecified: E03.9

## 2018-06-01 HISTORY — DX: Ulcer of esophagus without bleeding: K22.10

## 2018-06-01 HISTORY — DX: Unspecified osteoarthritis, unspecified site: M19.90

## 2018-06-01 HISTORY — PX: COLONOSCOPY WITH PROPOFOL: SHX5780

## 2018-06-01 HISTORY — DX: Esophageal obstruction: K22.2

## 2018-06-01 SURGERY — COLONOSCOPY WITH PROPOFOL
Anesthesia: General

## 2018-06-01 MED ORDER — PROPOFOL 500 MG/50ML IV EMUL
INTRAVENOUS | Status: AC
  Filled 2018-06-01: qty 50

## 2018-06-01 MED ORDER — SODIUM CHLORIDE 0.9 % IV SOLN: INTRAVENOUS | Status: DC

## 2018-06-01 MED ORDER — PHENYLEPHRINE HCL 10 MG/ML IJ SOLN
INTRAMUSCULAR | Status: DC | PRN
  Administered 2018-06-01: 100 ug via INTRAVENOUS

## 2018-06-01 MED ORDER — LIDOCAINE 2% (20 MG/ML) 5 ML SYRINGE
INTRAMUSCULAR | Status: DC | PRN
  Administered 2018-06-01: 30 mg via INTRAVENOUS

## 2018-06-01 MED ORDER — FENTANYL CITRATE (PF) 100 MCG/2ML IJ SOLN
INTRAMUSCULAR | Status: AC
  Filled 2018-06-01: qty 2

## 2018-06-01 MED ORDER — FENTANYL CITRATE (PF) 100 MCG/2ML IJ SOLN
INTRAMUSCULAR | Status: DC | PRN
  Administered 2018-06-01 (×2): 50 ug via INTRAVENOUS

## 2018-06-01 MED ORDER — PROPOFOL 10 MG/ML IV BOLUS
INTRAVENOUS | Status: AC
  Filled 2018-06-01: qty 20

## 2018-06-01 MED ORDER — PROPOFOL 10 MG/ML IV BOLUS
INTRAVENOUS | Status: DC | PRN
  Administered 2018-06-01: 100 mg via INTRAVENOUS

## 2018-06-01 MED ORDER — EPHEDRINE SULFATE 50 MG/ML IJ SOLN
INTRAMUSCULAR | Status: DC | PRN
  Administered 2018-06-01: 10 mg via INTRAVENOUS

## 2018-06-01 MED ORDER — LIDOCAINE HCL (PF) 2 % IJ SOLN
INTRAMUSCULAR | Status: AC
  Filled 2018-06-01: qty 10

## 2018-06-01 MED ORDER — SODIUM CHLORIDE 0.9 % IV SOLN
INTRAVENOUS | Status: DC
  Administered 2018-06-01: 1000 mL via INTRAVENOUS

## 2018-06-01 MED ORDER — PROPOFOL 500 MG/50ML IV EMUL
INTRAVENOUS | Status: DC | PRN
  Administered 2018-06-01: 160 ug/kg/min via INTRAVENOUS

## 2018-06-01 NOTE — Op Note (Signed)
[facility] Gastroenterology Patient Name: Stephanie Anthony Procedure Date: 06/01/2018 10:29 AM MRN: [id number] Account #: [id number] Date of Birth: [birth year] Admit Type: Outpatient Age: 85 Room: [facility] ENDO ROOM 3 Gender: Female Note Status: Finalized Procedure:            Colonoscopy Indications:          Abnormal CT of the GI tract Providers:            [name], MD Medicines:            Monitored Anesthesia Care Complications:        No immediate complications. Procedure:            Pre-Anesthesia Assessment:                       - ASA Grade Assessment: III - A patient with severe                        systemic disease.                       After obtaining informed consent, the colonoscope was                        passed under direct vision. Throughout the procedure,                        the patient's blood pressure, pulse, and oxygen                        saturations were monitored continuously. The                        Colonoscope was introduced through the anus and                        advanced to the the cecum, identified by appendiceal                        orifice and ileocecal valve. The colonoscopy was                        unusually difficult due to a redundant colon.                        Successful completion of the procedure was aided by                        changing the patient to a supine position, changing the                        patient to a prone position and using manual pressure.                        The quality of the bowel preparation was good. The                        patient tolerated the procedure. The quality of the                        bowel  preparation was good. Findings:      Multiple medium-mouthed diverticula were found in the sigmoid colon and       descending colon.      A 5 mm polyp was found in the proximal descending colon. The polyp was       sessile. The polyp was removed with a piecemeal  technique using a cold       biopsy forceps. Resection and retrieval were complete.      The appendiceal orifice is noted to be normal in appearance, as is the       cecum and IC valve.      The retroflexed view of the distal rectum and anal verge was normal and       showed no anal or rectal abnormalities.      The digital rectal exam was normal. Impression:           - Diverticulosis in the sigmoid colon and in the                        descending colon.                       - One 5 mm polyp in the proximal descending colon,                        removed piecemeal using a cold biopsy forceps. Resected                        and retrieved.                       - The distal rectum and anal verge are normal on                        retroflexion view. Recommendation:       - Discharge patient to home.                       - Telephone GI clinic for pathology results in 1 week. Procedure Code(s):    --- Professional ---                       [phone], Colonoscopy, flexible; with biopsy, single or                        multiple Diagnosis Code(s):    --- Professional ---                       D12.4, Benign neoplasm of descending colon                       K57.30, Diverticulosis of large intestine without                        perforation or abscess without bleeding                       R93.3, Abnormal findings on diagnostic imaging of other                        parts of digestive tract CPT copyright 2018 American Medical Association. All rights reserved.  The codes documented in this report are preliminary and upon coder review may  be revised to meet current compliance requirements. [name], MD 06/01/2018 12:09:17 PM This report has been signed electronically. Number of Addenda: 0 Note Initiated On: 06/01/2018 10:29 AM Scope Withdrawal Time: 0 hours 10 minutes 41 seconds  Total Procedure Duration: 0 hours 28 minutes 57 seconds       [facility]

## 2018-06-01 NOTE — Anesthesia Postprocedure Evaluation (Signed)
Anesthesia Post Note  Patient: Crystalle Roberta Stgermain  Procedure(s) Performed: COLONOSCOPY WITH PROPOFOL (N/A ) ESOPHAGOGASTRODUODENOSCOPY (EGD) WITH PROPOFOL (N/A )  Patient location during evaluation: Endoscopy Anesthesia Type: General Level of consciousness: awake and alert and oriented Pain management: pain level controlled Vital Signs Assessment: post-procedure vital signs reviewed and stable Respiratory status: spontaneous breathing, nonlabored ventilation and respiratory function stable Cardiovascular status: blood pressure returned to baseline and stable Postop Assessment: no signs of nausea or vomiting Anesthetic complications: no     Last Vitals:  Vitals:   06/01/18 1258 06/01/18 1308  BP:    Pulse: (!) 54 (!) 56  Resp: 15 12  Temp:    SpO2: 100% 100%    Last Pain:  Vitals:   06/01/18 1208  TempSrc: Tympanic  PainSc:                  Dimonique Bourdeau

## 2018-06-01 NOTE — H&P (Signed)
Outpatient short stay form Pre-procedure 06/01/2018 10:57 AM Stephanie Sails MD  Primary Physician: Dr [name]  Reason for visit: EGD and colonoscopy  History of present illness: Patient is a 85 year old female presenting today as above.  She has personal history of some dysphagia her barium swallow done 05/21/2018 showed a small sliding hernia/hiatal hernia as well as a prominent aortic arch impression.  She showed no other abnormality in the esophagus.  Normal barium tablet.  She also has a history of a abnormal appearing appendix.  This was also recently verified on a CT scan and there are surgical plans to remove this appendix.  However request to do a colonoscopy to reevaluate the cecum and colon otherwise prior to this procedure.  Patient tolerated her prep well.  She takes no aspirin or blood thinning agent.    Current Facility-Administered Medications:  .  0.9 %  sodium chloride infusion, , Intravenous, Continuous, Stephanie Sails, MD, Last Rate: 20 mL/hr at 06/01/18 1028, 1,000 mL at 06/01/18 1028 .  0.9 %  sodium chloride infusion, , Intravenous, Continuous, Stephanie Sails, MD  Medications Prior to Admission  Medication Sig Dispense Refill Last Dose  . ALPRAZolam (XANAX) 0.25 MG tablet Take 1 tablet by mouth at bedtime as needed.   Past Week at Unknown time  . ARIPiprazole (ABILIFY) 2 MG tablet Take 2 mg by mouth daily.   Past Week at Unknown time  . buPROPion (WELLBUTRIN SR) 100 MG 12 hr tablet Take 100 mg by mouth 2 (two) times daily.    Past Week at Unknown time  . Calcium Carbonate Antacid 400 MG CHEW Chew 1 tablet by mouth daily as needed.    Past Week at Unknown time  . cholecalciferol (VITAMIN D) 1000 UNITS tablet Take 1 tablet (1,000 Units total) by mouth daily. 30 tablet 0 Past Week at Unknown time  . enalapril (VASOTEC) 2.5 MG tablet Take 2.5 mg by mouth daily.    Past Week at Unknown time  . furosemide (LASIX) 20 MG tablet Take 20 mg by mouth daily.     Past Week at Unknown time  . HYDROcodone-acetaminophen (NORCO/VICODIN) 5-325 MG per tablet Take 1 tablet by mouth every 6 (six) hours as needed.    Past Week at Unknown time  . lamoTRIgine (LAMICTAL) 100 MG tablet Take 1 tablet by mouth at bedtime.    Past Week at Unknown time  . latanoprost (XALATAN) 0.005 % ophthalmic solution Place 1 drop into both eyes at bedtime.   Past Week at Unknown time  . losartan (COZAAR) 25 MG tablet Take 25 mg by mouth daily.   06/01/2018 at 0500  . lovastatin (MEVACOR) 40 MG tablet Take 1 tablet (40 mg total) by mouth nightly.  6 Past Week at Unknown time  . meclizine (ANTIVERT) 25 MG tablet Take 1 tablet (25 mg total) by mouth 3 (three) times daily as needed for dizziness. 20 tablet 0 Past Week at Unknown time  . ondansetron (ZOFRAN) 4 MG tablet TAKE ONE TABLET BY MOUTH EVERY 4 HOURS AS NEEDED FOR NAUSEA  1 Past Week at Unknown time  . Oxcarbazepine (TRILEPTAL) 300 MG tablet Take 300 mg by mouth daily.   Past Week at Unknown time  . pantoprazole (PROTONIX) 40 MG tablet Take 40 mg by mouth daily.    Past Week at Unknown time  . polyethylene glycol (MIRALAX / GLYCOLAX) packet Take 17 g by mouth daily.   Past Week at Unknown time  . risperiDONE (  RISPERDAL) 0.25 MG tablet Take 0.25 mg by mouth at bedtime.    Past Week at Unknown time  . temazepam (RESTORIL) 15 MG capsule TAKE ONE CAPSULE BY MOUTH AT BEDTIME FOR INSOMNIA  1 Past Week at Unknown time  . venlafaxine XR (EFFEXOR-XR) 75 MG 24 hr capsule Take 225 mg by mouth daily.  1 Past Week at Unknown time  . vitamin B-12 (CYANOCOBALAMIN) 1000 MCG tablet Take 1,000 mcg by mouth daily.   Past Week at Unknown time  . levothyroxine (SYNTHROID, LEVOTHROID) 100 MCG tablet Take 100 mcg by mouth daily before breakfast.    unknown at unknown     Allergies  Allergen Reactions  . Aspirin Other (See Comments)    Reaction: Rectal bleeding  . Codeine Nausea And Vomiting  . Prednisone Other (See Comments)    Reaction:  Hallucinations  . Valium [Diazepam] Other (See Comments)    Strange feelings. Reaction: Strange feelings     Past Medical History:  Diagnosis Date  . Arthritis   . Bipolar 1 disorder ([place])   . Cataracts, bilateral   . Chronic kidney disease    stage 3  . Chronic kidney disease   . Chronic mitral valve disease   . Coronary artery disease   . Deviated nasal septum   . Erosive esophagitis   . Erosive esophagitis   . Esophageal stricture   . Fibroid tumor   . Hypertension   . Hypothyroidism   . Murmur, cardiac   . 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 Pure hypercholesterolemia   . Renal cyst, left    bilateral  . Seasonal allergies     Review of systems:      Physical Exam    Heart and lungs: Regular rate and rhythm without rub or gallop, lungs are bilaterally clear.    HEENT: Normocephalic atraumatic eyes are anicteric    Other:    Pertinant exam for procedure: Soft nontender nondistended bowel sounds positive normoactive    Planned proceedures: EGD and colonoscopy with indicated procedures. I have discussed the risks benefits and complications of procedures to include not limited to bleeding, infection, perforation and the risk of sedation and the patient wishes to proceed.    Stephanie Sails, MD Gastroenterology 06/01/2018  10:57 AM

## 2018-06-01 NOTE — Anesthesia Preprocedure Evaluation (Signed)
Anesthesia Evaluation  Patient identified by MRN, date of birth, ID band Patient awake    Reviewed: Allergy & Precautions, H&P , NPO status , Patient's Chart, lab work & pertinent test results, reviewed documented beta blocker date and time   Airway Mallampati: II   Neck ROM: full    Dental  (+) Poor Dentition   Pulmonary neg pulmonary ROS, former smoker,    Pulmonary exam normal        Cardiovascular Exercise Tolerance: Poor hypertension, + CAD  Normal cardiovascular exam+ Valvular Problems/Murmurs MR and AI  Rhythm:regular Rate:Normal     Neuro/Psych negative neurological ROS  negative psych ROS   GI/Hepatic Neg liver ROS, PUD,   Endo/Other  Hypothyroidism   Renal/GU Renal disease  negative genitourinary   Musculoskeletal   Abdominal   Peds  Hematology negative hematology ROS (+)   Anesthesia Other Findings Past Medical History: No date: Arthritis No date: Bipolar 1 disorder (HCC) No date: Cataracts, bilateral No date: Chronic kidney disease     Comment:  stage 3 No date: Chronic kidney disease No date: Chronic mitral valve disease No date: Coronary artery disease No date: Deviated nasal septum No date: Erosive esophagitis No date: Erosive esophagitis No date: Esophageal stricture No date: Fibroid tumor No date: Hypertension No date: Hypothyroidism No date: Murmur, cardiac No date: Nephrogenic diabetes insipidus (HCC) No date: Pure hypercholesterolemia No date: Renal cyst, left     Comment:  bilateral No date: Seasonal allergies Past Surgical History: No date: BACK SURGERY No date: CARDIAC CATHETERIZATION No date: CHOLECYSTECTOMY No date: ESOPHAGEAL DILATION 10/20/2014: ESOPHAGOGASTRODUODENOSCOPY (EGD) WITH PROPOFOL; N/A     Comment:  Procedure: ESOPHAGOGASTRODUODENOSCOPY (EGD) WITH               PROPOFOL;  Surgeon: [name], MD;  Location:               [facility] ENDOSCOPY;  Service:  Endoscopy;  Laterality: N/A; No date: knee replacement, right No date: NOSE SURGERY No date: THYROIDECTOMY No date: TOTAL THYROIDECTOMY BMI    Body Mass Index:  27.25 kg/m     Reproductive/Obstetrics negative OB ROS                             Anesthesia Physical Anesthesia Plan  ASA: III  Anesthesia Plan: General   Post-op Pain Management:    Induction:   PONV Risk Score and Plan:   Airway Management Planned:   Additional Equipment:   Intra-op Plan:   Post-operative Plan:   Informed Consent: I have reviewed the patients History and Physical, chart, labs and discussed the procedure including the risks, benefits and alternatives for the proposed anesthesia with the patient or authorized representative who has indicated his/her understanding and acceptance.     Dental Advisory Given  Plan Discussed with: CRNA  Anesthesia Plan Comments:         Anesthesia Quick Evaluation

## 2018-06-01 NOTE — Op Note (Addendum)
[facility] Gastroenterology Patient Name: Stephanie Anthony Procedure Date: 06/01/2018 10:30 AM MRN: [id number] Account #: [id number] Date of Birth: [birth year] Admit Type: Outpatient Age: 85 Room: [facility] ENDO ROOM 3 Gender: Female Note Status: Finalized Procedure:            Upper GI endoscopy Indications:          Dysphagia Providers:            [name], MD Referring MD:         [name] (Referring MD) Medicines:            Monitored Anesthesia Care Complications:        No immediate complications. Procedure:            Pre-Anesthesia Assessment:                       - ASA Grade Assessment: III - A patient with severe                        systemic disease.                       After obtaining informed consent, the endoscope was                        passed under direct vision. Throughout the procedure,                        the patient's blood pressure, pulse, and oxygen                        saturations were monitored continuously. The Endoscope                        was introduced through the mouth, and advanced to the                        third part of duodenum. The upper GI endoscopy was                        accomplished without difficulty. The patient tolerated                        the procedure well. Findings:      Abnormal motility was noted in the upper third of the esophagus, in the       middle third of the esophagus and in the lower third of the esophagus.       The cricopharyngeus was normal. There are extra peristaltic waves in the       esophageal body. Tertiary peristaltic waves are noted.      A low-grade of narrowing Schatzki ring was found at the gastroesophageal       junction. A TTS dilator was passed through the scope. Dilation with a       12-13.5-15 mm balloon dilator was performed to 15 mm with opening of the       ring noted. Biopsies were taken with a cold forceps for histology.      A small hiatal hernia was  found. The Z-line was a variable distance from       incisors; the hiatal hernia was sliding.  Diffuse and patchy minimal inflammation characterized by congestion       (edema) and erythema was found in the gastric body and in the gastric       antrum. Biopsies were taken with a cold forceps for histology. Biopsies       were taken with a cold forceps for Helicobacter pylori testing.      The cardia and gastric fundus were normal on retroflexion otherwise.      Patchy mild inflammation characterized by erythema was found in the       duodenal bulb.      The exam of the duodenum was otherwise normal.      A non-bleeding diverticulum with a small opening and no stigmata of       recent bleeding was found at the cricopharyngeus. Impression:           - Abnormal esophageal motility.                       - Low-grade of narrowing Schatzki ring. Dilated.                        Biopsied.                       - Small hiatal hernia.                       - Gastritis. Biopsied.                       - Duodenitis. Recommendation:       - Full liquid diet today.                       - Full liquid diet for 1 day, then advance as tolerated                        to soft diet for 1 day.                       - Use Protonix (pantoprazole) 40 mg PO daily daily. Procedure Code(s):    --- Professional ---                       [phone], Esophagogastroduodenoscopy, flexible, transoral;                        with transendoscopic balloon dilation of esophagus                        (less than 30 mm diameter) Diagnosis Code(s):    --- Professional ---                       K22.4, Dyskinesia of esophagus                       K22.2, Esophageal obstruction                       K44.9, Diaphragmatic hernia without obstruction or                        gangrene  K29.70, Gastritis, unspecified, without bleeding                       K29.80, Duodenitis without bleeding                        R13.10, Dysphagia, unspecified CPT copyright 2018 American Medical Association. All rights reserved. The codes documented in this report are preliminary and upon coder review may  be revised to meet current compliance requirements. [name], MD 06/01/2018 11:30:04 AM This report has been signed electronically. Number of Addenda: 0 Note Initiated On: 06/01/2018 10:30 AM      [facility]

## 2018-06-01 NOTE — Transfer of Care (Signed)
Immediate Anesthesia Transfer of Care Note  Patient: Stephanie Anthony  Procedure(s) Performed: COLONOSCOPY WITH PROPOFOL (N/A ) ESOPHAGOGASTRODUODENOSCOPY (EGD) WITH PROPOFOL (N/A )  Patient Location: PACU and Endoscopy Unit  Anesthesia Type:General  Level of Consciousness: drowsy  Airway & Oxygen Therapy: Patient Spontanous Breathing and Patient connected to nasal cannula oxygen  Post-op Assessment: Report given to RN and Post -op Vital signs reviewed and stable  Post vital signs: Reviewed and stable  Last Vitals:  Vitals Value Taken Time  BP    Temp    Pulse 67 06/01/2018 12:09 PM  Resp 16 06/01/2018 12:09 PM  SpO2 97 % 06/01/2018 12:09 PM  Vitals shown include unvalidated device data.  Last Pain:  Vitals:   06/01/18 1015  TempSrc: Tympanic  PainSc: 0-No pain         Complications: No apparent anesthesia complications

## 2018-06-01 NOTE — Anesthesia Post-op Follow-up Note (Signed)
Anesthesia QCDR form completed.        

## 2018-06-02 ENCOUNTER — Encounter: Payer: Self-pay | Admitting: Gastroenterology

## 2018-06-02 LAB — SURGICAL PATHOLOGY

## 2018-06-09 ENCOUNTER — Ambulatory Visit: Payer: Self-pay | Admitting: General Surgery

## 2018-06-14 ENCOUNTER — Other Ambulatory Visit: Payer: Self-pay

## 2018-06-14 ENCOUNTER — Encounter
Admission: RE | Admit: 2018-06-14 | Discharge: 2018-06-14 | Disposition: A | Discharge status: Home / Self Care | Payer: Medicare Other | Source: Ambulatory Visit | Attending: General Surgery | Admitting: General Surgery

## 2018-06-14 DIAGNOSIS — Z01812 Encounter for preprocedural laboratory examination: Secondary | ICD-10-CM | POA: Diagnosis not present

## 2018-06-14 HISTORY — DX: Gastro-esophageal reflux disease without esophagitis: K21.9

## 2018-06-14 HISTORY — DX: Personal history of other diseases of the digestive system: Z87.19

## 2018-06-14 LAB — TYPE AND SCREEN
ABO/RH(D): A POS
Antibody Screen: NEGATIVE

## 2018-06-14 NOTE — Patient Instructions (Signed)
Your procedure is scheduled on: Monday, June 21, 2018 Report to Day Surgery on the 2nd floor of the [facility]. To find out your arrival time, please call [phone] between 1PM - 3PM on: Friday, June 18, 2018  REMEMBER: Instructions that are not followed completely may result in serious medical risk, up to and including death; or upon the discretion of your surgeon and anesthesiologist your surgery may need to be rescheduled.  Do not eat or drink after midnight the night before surgery.  No gum chewing, lozengers or hard candies.  FOLLOW BOWEL PREP INSTRUCTIONS GIVEN TO YOU BY DR. CINTRON-DIAZ.  No Alcohol for 24 hours before or after surgery.  No Smoking including e-cigarettes for 24 hours prior to surgery.  No chewable tobacco products for at least 6 hours prior to surgery.  No nicotine patches on the day of surgery.  On the morning of surgery brush your teeth with toothpaste and water, you may rinse your mouth with mouthwash if you wish. Do not swallow any toothpaste or mouthwash.  Notify your doctor if there is any change in your medical condition (cold, fever, infection).  Do not wear jewelry, make-up, hairpins, clips or nail polish.  Do not wear lotions, powders, or perfumes.   Do not shave 48 hours prior to surgery.   Contacts and dentures may not be worn into surgery.  Do not bring valuables to the hospital, including drivers license, insurance or credit cards.  [facility] is not responsible for any belongings or valuables.   TAKE THESE MEDICATIONS THE MORNING OF SURGERY:  1.  Levothyroxine 2.  Pantoprazole - (take one the night before and one on the morning of surgery - helps to prevent nausea after surgery.)  Use CHG wipes as directed on instruction sheet.  NOW!  Stop Anti-inflammatories (NSAIDS) such as Advil, Aleve, Ibuprofen, Motrin, Naproxen, Naprosyn and Aspirin based products such as Excedrin, Goodys Powder, BC Powder. (May take Tylenol or  Acetaminophen if needed.)  NOW!  Stop ANY OVER THE COUNTER supplements until after surgery. (May continue Vitamin D.)  If you are being admitted to the hospital overnight, leave your suitcase in the car. After surgery it may be brought to your room.  Please call [phone] if you have any questions about these instructions.

## 2018-06-20 MED ORDER — CEFAZOLIN SODIUM-DEXTROSE 2-4 GM/100ML-% IV SOLN
2.0000 g | INTRAVENOUS | Status: AC
  Administered 2018-06-21: 2 g via INTRAVENOUS

## 2018-06-21 ENCOUNTER — Other Ambulatory Visit: Payer: Self-pay

## 2018-06-21 ENCOUNTER — Observation Stay
Admission: RE | Admit: 2018-06-21 | Discharge: 2018-06-22 | Disposition: A | Discharge status: Home Health | Payer: Medicare Other | Attending: General Surgery | Admitting: General Surgery

## 2018-06-21 ENCOUNTER — Encounter
Admission: RE | Disposition: A | Discharge status: Home Health | Payer: Self-pay | Source: Home / Self Care | Attending: General Surgery

## 2018-06-21 ENCOUNTER — Inpatient Hospital Stay: Payer: Medicare Other | Admitting: Anesthesiology

## 2018-06-21 ENCOUNTER — Encounter: Payer: Self-pay | Admitting: *Deleted

## 2018-06-21 DIAGNOSIS — Z79899 Other long term (current) drug therapy: Secondary | ICD-10-CM | POA: Diagnosis not present

## 2018-06-21 DIAGNOSIS — N183 Chronic kidney disease, stage 3 (moderate): Secondary | ICD-10-CM | POA: Insufficient documentation

## 2018-06-21 DIAGNOSIS — C181 Malignant neoplasm of appendix: Principal | ICD-10-CM | POA: Insufficient documentation

## 2018-06-21 DIAGNOSIS — Z87891 Personal history of nicotine dependence: Secondary | ICD-10-CM | POA: Insufficient documentation

## 2018-06-21 DIAGNOSIS — K388 Other specified diseases of appendix: Secondary | ICD-10-CM | POA: Diagnosis present

## 2018-06-21 DIAGNOSIS — Z888 Allergy status to other drugs, medicaments and biological substances status: Secondary | ICD-10-CM | POA: Insufficient documentation

## 2018-06-21 DIAGNOSIS — E7800 Pure hypercholesterolemia, unspecified: Secondary | ICD-10-CM | POA: Diagnosis not present

## 2018-06-21 DIAGNOSIS — E039 Hypothyroidism, unspecified: Secondary | ICD-10-CM | POA: Diagnosis not present

## 2018-06-21 DIAGNOSIS — Z886 Allergy status to analgesic agent status: Secondary | ICD-10-CM | POA: Insufficient documentation

## 2018-06-21 DIAGNOSIS — Z79890 Hormone replacement therapy (postmenopausal): Secondary | ICD-10-CM | POA: Insufficient documentation

## 2018-06-21 DIAGNOSIS — I129 Hypertensive chronic kidney disease with stage 1 through stage 4 chronic kidney disease, or unspecified chronic kidney disease: Secondary | ICD-10-CM | POA: Insufficient documentation

## 2018-06-21 DIAGNOSIS — Z885 Allergy status to narcotic agent status: Secondary | ICD-10-CM | POA: Diagnosis not present

## 2018-06-21 DIAGNOSIS — K389 Disease of appendix, unspecified: Secondary | ICD-10-CM | POA: Diagnosis present

## 2018-06-21 DIAGNOSIS — F319 Bipolar disorder, unspecified: Secondary | ICD-10-CM | POA: Diagnosis not present

## 2018-06-21 HISTORY — PX: LAPAROSCOPIC APPENDECTOMY: SHX408

## 2018-06-21 LAB — ABO/RH: ABO/RH(D): A POS

## 2018-06-21 SURGERY — APPENDECTOMY, LAPAROSCOPIC
Anesthesia: General | Laterality: Right

## 2018-06-21 MED ORDER — LIDOCAINE HCL (PF) 2 % IJ SOLN
INTRAMUSCULAR | Status: AC
  Filled 2018-06-21: qty 10

## 2018-06-21 MED ORDER — ENOXAPARIN SODIUM 40 MG/0.4ML ~~LOC~~ SOLN: 40.0000 mg | SUBCUTANEOUS | Status: DC

## 2018-06-21 MED ORDER — ACETAMINOPHEN 650 MG RE SUPP: 650.0000 mg | Freq: Four times a day (QID) | RECTAL | Status: DC | PRN

## 2018-06-21 MED ORDER — DEXAMETHASONE SODIUM PHOSPHATE 10 MG/ML IJ SOLN
INTRAMUSCULAR | Status: AC
  Filled 2018-06-21: qty 1

## 2018-06-21 MED ORDER — LATANOPROST 0.005 % OP SOLN
1.0000 [drp] | Freq: Every day | OPHTHALMIC | Status: DC
  Administered 2018-06-21: 1 [drp] via OPHTHALMIC
  Filled 2018-06-21: qty 2.5

## 2018-06-21 MED ORDER — ONDANSETRON HCL 4 MG/2ML IJ SOLN
INTRAMUSCULAR | Status: DC | PRN
  Administered 2018-06-21: 4 mg via INTRAVENOUS

## 2018-06-21 MED ORDER — FENTANYL CITRATE (PF) 100 MCG/2ML IJ SOLN
25.0000 ug | INTRAMUSCULAR | Status: DC | PRN
  Administered 2018-06-21 (×4): 25 ug via INTRAVENOUS

## 2018-06-21 MED ORDER — PROPOFOL 10 MG/ML IV BOLUS
INTRAVENOUS | Status: AC
  Filled 2018-06-21: qty 20

## 2018-06-21 MED ORDER — FENTANYL CITRATE (PF) 100 MCG/2ML IJ SOLN
INTRAMUSCULAR | Status: AC
  Filled 2018-06-21: qty 2

## 2018-06-21 MED ORDER — ACETAMINOPHEN 325 MG PO TABS: 650.0000 mg | ORAL_TABLET | Freq: Four times a day (QID) | ORAL | Status: DC | PRN

## 2018-06-21 MED ORDER — ONDANSETRON HCL 4 MG/2ML IJ SOLN
INTRAMUSCULAR | Status: AC
  Filled 2018-06-21: qty 2

## 2018-06-21 MED ORDER — PHENYLEPHRINE HCL 10 MG/ML IJ SOLN
INTRAMUSCULAR | Status: DC | PRN
  Administered 2018-06-21: 100 ug via INTRAVENOUS

## 2018-06-21 MED ORDER — BUPIVACAINE-EPINEPHRINE 0.25% -1:200000 IJ SOLN
INTRAMUSCULAR | Status: DC | PRN
  Administered 2018-06-21: 6 mL

## 2018-06-21 MED ORDER — SODIUM CHLORIDE 0.9 % IV SOLN
INTRAVENOUS | Status: DC
  Administered 2018-06-21: 19:00:00 via INTRAVENOUS

## 2018-06-21 MED ORDER — ROCURONIUM BROMIDE 50 MG/5ML IV SOLN
INTRAVENOUS | Status: AC
  Filled 2018-06-21: qty 1

## 2018-06-21 MED ORDER — SUCCINYLCHOLINE CHLORIDE 20 MG/ML IJ SOLN
INTRAMUSCULAR | Status: DC | PRN
  Administered 2018-06-21: 100 mg via INTRAVENOUS

## 2018-06-21 MED ORDER — SODIUM CHLORIDE 0.9 % IV SOLN
INTRAVENOUS | Status: DC
  Administered 2018-06-21: 11:00:00 via INTRAVENOUS

## 2018-06-21 MED ORDER — SUGAMMADEX SODIUM 200 MG/2ML IV SOLN
INTRAVENOUS | Status: DC | PRN
  Administered 2018-06-21: 157.8 mg via INTRAVENOUS

## 2018-06-21 MED ORDER — LAMOTRIGINE 100 MG PO TABS
100.0000 mg | ORAL_TABLET | Freq: Every day | ORAL | Status: DC
  Administered 2018-06-21: 100 mg via ORAL
  Filled 2018-06-21: qty 1

## 2018-06-21 MED ORDER — ONDANSETRON 4 MG PO TBDP: 4.0000 mg | ORAL_TABLET | Freq: Four times a day (QID) | ORAL | Status: DC | PRN

## 2018-06-21 MED ORDER — ONDANSETRON HCL 4 MG/2ML IJ SOLN
4.0000 mg | Freq: Once | INTRAMUSCULAR | Status: AC | PRN
  Administered 2018-06-21: 4 mg via INTRAVENOUS

## 2018-06-21 MED ORDER — ROCURONIUM BROMIDE 100 MG/10ML IV SOLN
INTRAVENOUS | Status: DC | PRN
  Administered 2018-06-21: 20 mg via INTRAVENOUS
  Administered 2018-06-21: 10 mg via INTRAVENOUS
  Administered 2018-06-21: 5 mg via INTRAVENOUS

## 2018-06-21 MED ORDER — VITAMIN D 25 MCG (1000 UNIT) PO TABS
1000.0000 [IU] | ORAL_TABLET | Freq: Every day | ORAL | Status: DC
  Administered 2018-06-21 – 2018-06-22 (×2): 1000 [IU] via ORAL
  Filled 2018-06-21 (×3): qty 1

## 2018-06-21 MED ORDER — ARIPIPRAZOLE 2 MG PO TABS
2.0000 mg | ORAL_TABLET | Freq: Every day | ORAL | Status: DC
  Administered 2018-06-21: 2 mg via ORAL
  Filled 2018-06-21 (×2): qty 1

## 2018-06-21 MED ORDER — CEFAZOLIN SODIUM-DEXTROSE 2-4 GM/100ML-% IV SOLN
INTRAVENOUS | Status: AC
  Filled 2018-06-21: qty 100

## 2018-06-21 MED ORDER — FENTANYL CITRATE (PF) 100 MCG/2ML IJ SOLN
INTRAMUSCULAR | Status: AC
  Administered 2018-06-21: 25 ug via INTRAVENOUS
  Filled 2018-06-21: qty 2

## 2018-06-21 MED ORDER — LEVOTHYROXINE SODIUM 88 MCG PO TABS
88.0000 ug | ORAL_TABLET | Freq: Every day | ORAL | Status: DC
  Administered 2018-06-22: 88 ug via ORAL
  Filled 2018-06-21: qty 1

## 2018-06-21 MED ORDER — DEXAMETHASONE SODIUM PHOSPHATE 10 MG/ML IJ SOLN
INTRAMUSCULAR | Status: DC | PRN
  Administered 2018-06-21: 10 mg via INTRAVENOUS

## 2018-06-21 MED ORDER — LOSARTAN POTASSIUM 25 MG PO TABS
25.0000 mg | ORAL_TABLET | Freq: Every day | ORAL | Status: DC
  Administered 2018-06-21: 25 mg via ORAL
  Filled 2018-06-21 (×2): qty 1

## 2018-06-21 MED ORDER — LIDOCAINE HCL (CARDIAC) PF 100 MG/5ML IV SOSY
PREFILLED_SYRINGE | INTRAVENOUS | Status: DC | PRN
  Administered 2018-06-21: 100 mg via INTRAVENOUS

## 2018-06-21 MED ORDER — PANTOPRAZOLE SODIUM 40 MG PO TBEC
40.0000 mg | DELAYED_RELEASE_TABLET | Freq: Every day | ORAL | Status: DC
  Administered 2018-06-22: 40 mg via ORAL
  Filled 2018-06-21: qty 1

## 2018-06-21 MED ORDER — ONDANSETRON HCL 4 MG/2ML IJ SOLN
4.0000 mg | Freq: Four times a day (QID) | INTRAMUSCULAR | Status: DC | PRN
  Administered 2018-06-22: 4 mg via INTRAVENOUS
  Filled 2018-06-21: qty 2

## 2018-06-21 MED ORDER — MORPHINE SULFATE (PF) 4 MG/ML IV SOLN: 4.0000 mg | INTRAVENOUS | Status: DC | PRN

## 2018-06-21 MED ORDER — HYDROCODONE-ACETAMINOPHEN 5-325 MG PO TABS
1.0000 | ORAL_TABLET | ORAL | Status: DC | PRN
  Administered 2018-06-21 (×2): 2 via ORAL
  Filled 2018-06-21 (×2): qty 2

## 2018-06-21 MED ORDER — FENTANYL CITRATE (PF) 100 MCG/2ML IJ SOLN
INTRAMUSCULAR | Status: DC | PRN
  Administered 2018-06-21 (×2): 50 ug via INTRAVENOUS

## 2018-06-21 SURGICAL SUPPLY — 75 items
APPLIER CLIP LOGIC TI 5 (MISCELLANEOUS) ×4 IMPLANT
BLADE SURG SZ10 CARB STEEL (BLADE) ×4 IMPLANT
BLADE SURG SZ11 CARB STEEL (BLADE) ×4 IMPLANT
CANISTER SUCT 1200ML W/VALVE (MISCELLANEOUS) ×4 IMPLANT
CHLORAPREP W/TINT 26ML (MISCELLANEOUS) ×4 IMPLANT
CNTNR SPEC 2.5X3XGRAD LEK (MISCELLANEOUS) ×2
CONT SPEC 4OZ STER OR WHT (MISCELLANEOUS) ×2
CONTAINER SPEC 2.5X3XGRAD LEK (MISCELLANEOUS) ×2 IMPLANT
COVER WAND RF STERILE (DRAPES) ×4 IMPLANT
CUTTER FLEX LINEAR 45M (STAPLE) ×4 IMPLANT
DECANTER SPIKE VIAL GLASS SM (MISCELLANEOUS) ×4 IMPLANT
DEFOGGER SCOPE WARMER CLEARIFY (MISCELLANEOUS) ×4 IMPLANT
DERMABOND ADVANCED (GAUZE/BANDAGES/DRESSINGS) ×4
DERMABOND ADVANCED .7 DNX12 (GAUZE/BANDAGES/DRESSINGS) ×4 IMPLANT
ELECT CAUTERY BLADE 6.4 (BLADE) ×4 IMPLANT
ELECT REM PT RETURN 9FT ADLT (ELECTROSURGICAL) ×4
ELECTRODE REM PT RTRN 9FT ADLT (ELECTROSURGICAL) ×2 IMPLANT
GLOVE BIO SURGEON STRL SZ 6.5 (GLOVE) ×12 IMPLANT
GLOVE BIO SURGEONS STRL SZ 6.5 (GLOVE) ×4
GOWN STRL REUS W/ TWL LRG LVL3 (GOWN DISPOSABLE) ×8 IMPLANT
GOWN STRL REUS W/TWL LRG LVL3 (GOWN DISPOSABLE) ×8
GRASPER SUT TROCAR 14GX15 (MISCELLANEOUS) ×4 IMPLANT
HANDLE SUCTION POOLE (INSTRUMENTS) ×2 IMPLANT
HANDLE YANKAUER SUCT BULB TIP (MISCELLANEOUS) ×4 IMPLANT
HOLDER FOLEY CATH W/STRAP (MISCELLANEOUS) ×4 IMPLANT
IRRIGATION STRYKERFLOW (MISCELLANEOUS) ×2 IMPLANT
IRRIGATOR STRYKERFLOW (MISCELLANEOUS) ×4
IV NS 1000ML (IV SOLUTION) ×2
IV NS 1000ML BAXH (IV SOLUTION) ×2 IMPLANT
KIT TURNOVER KIT A (KITS) ×4 IMPLANT
LIGASURE LAP MARYLAND 5MM 37CM (ELECTROSURGICAL) IMPLANT
NEEDLE HYPO 22GX1.5 SAFETY (NEEDLE) ×4 IMPLANT
NEEDLE VERESS 14GA 120MM (NEEDLE) ×4 IMPLANT
NS IRRIG 1000ML POUR BTL (IV SOLUTION) ×4 IMPLANT
NS IRRIG 500ML POUR BTL (IV SOLUTION) ×4 IMPLANT
PACK COLON CLEAN CLOSURE (MISCELLANEOUS) IMPLANT
PACK LAP CHOLECYSTECTOMY (MISCELLANEOUS) ×4 IMPLANT
PENCIL ELECTRO HAND CTR (MISCELLANEOUS) ×4 IMPLANT
POUCH ENDO CATCH 10MM SPEC (MISCELLANEOUS) ×4 IMPLANT
RELOAD 45 VASCULAR/THIN (ENDOMECHANICALS) IMPLANT
RELOAD PROXIMATE 75MM BLUE (ENDOMECHANICALS) IMPLANT
RELOAD PROXIMATE TA60MM BLUE (ENDOMECHANICALS) IMPLANT
RELOAD STAPLE TA45 3.5 REG BLU (ENDOMECHANICALS) IMPLANT
RETRACTOR WOUND ALXS 18CM MED (MISCELLANEOUS) ×2 IMPLANT
RTRCTR WOUND ALEXIS O 18CM MED (MISCELLANEOUS) ×4
SCISSORS METZENBAUM CVD 33 (INSTRUMENTS) ×4 IMPLANT
SET TUBE SMOKE EVAC HIGH FLOW (TUBING) ×4 IMPLANT
SHEARS HARMONIC ACE PLUS 36CM (ENDOMECHANICALS) ×4 IMPLANT
SLEEVE ENDOPATH XCEL 5M (ENDOMECHANICALS) IMPLANT
SPONGE GAUZE 2X2 8PLY STER LF (GAUZE/BANDAGES/DRESSINGS) ×1
SPONGE GAUZE 2X2 8PLY STRL LF (GAUZE/BANDAGES/DRESSINGS) ×3 IMPLANT
SPONGE LAP 18X18 RF (DISPOSABLE) IMPLANT
SPONGE LAP 18X36 RFD (DISPOSABLE) IMPLANT
STAPLER GUN LINEAR PROX 60 (STAPLE) IMPLANT
STAPLER PROXIMATE 75MM BLUE (STAPLE) IMPLANT
SUCTION POOLE HANDLE (INSTRUMENTS) ×4
SURGILUBE 2OZ TUBE FLIPTOP (MISCELLANEOUS) IMPLANT
SUT MNCRL 4-0 (SUTURE) ×4
SUT MNCRL 4-0 27XMFL (SUTURE) ×4
SUT MNCRL AB 4-0 PS2 18 (SUTURE) ×4 IMPLANT
SUT PDS AB 1 TP1 96 (SUTURE) ×4 IMPLANT
SUT SILK 2 0 (SUTURE)
SUT SILK 2 0 SH CR/8 (SUTURE) IMPLANT
SUT SILK 2-0 30XBRD TIE 12 (SUTURE) IMPLANT
SUT VICRYL 3-0 CR8 SH (SUTURE) IMPLANT
SUT VICRYL PLUS ABS 0 54 (SUTURE) ×4 IMPLANT
SUTURE MNCRL 4-0 27XMF (SUTURE) ×4 IMPLANT
SYR 30ML LL (SYRINGE) ×4 IMPLANT
SYS LAPSCP GELPORT 120MM (MISCELLANEOUS)
SYSTEM LAPSCP GELPORT 120MM (MISCELLANEOUS) IMPLANT
TRAY FOLEY MTR SLVR 16FR STAT (SET/KITS/TRAYS/PACK) IMPLANT
TRAY FOLEY SLVR 16FR LF STAT (SET/KITS/TRAYS/PACK) ×4 IMPLANT
TROCAR XCEL 12X100 BLDLESS (ENDOMECHANICALS) ×4 IMPLANT
TROCAR XCEL NON-BLD 5MMX100MML (ENDOMECHANICALS) ×8 IMPLANT
TUBING EVAC SMOKE HEATED PNEUM (TUBING) ×4 IMPLANT

## 2018-06-21 NOTE — Anesthesia Post-op Follow-up Note (Signed)
Anesthesia QCDR form completed.        

## 2018-06-21 NOTE — Op Note (Signed)
Preoperative diagnosis:  Appendix mass  Postoperative diagnosis: Appendix mass  Procedure: Laparoscopic appendectomy.  Anesthesia: GETA  Surgeon: Dr. Windell Moment, MD  Wound Classification: Contaminated  Indications: Patient is a 85 y.o. female  presented with right lower quadrant pain to ED last month and on CT scan was found with a mass in the appendix of 3 cm. The mass was previously seen 4 years before and it measured 2 cm. It was discussed with patient about the need of appendectomy vs partial colectomy as treatment.   Findings: 1. Large mass in the appendix measuring 3 cm.  2. The base of the appendix and cecum was free of mass 3.  Frozen sections showed no malignant cells.  Margins were clear in the frozen section. 4.  No rupture of the mass 5. Normal anatomy 6. Adequate hemostasis.   Description of procedure: The patient was placed on the operating table in the supine position. General anesthesia was induced. A time-out was completed verifying correct patient, procedure, site, positioning, and implant(s) and/or special equipment prior to beginning this procedure. A Foley catheter and orogastric tubes were placed. The abdomen was prepped and draped in the usual sterile fashion.  An incision was made in a natural skin line above the umbilicus.   The fascia was elevated and the Veress needle inserted. Proper position was confirmed by aspiration and saline meniscus test. The abdomen was insufflated with carbon dioxide to a pressure of 15 mmHg. The patient tolerated insufflation well. A 12-mm optiview trocar was then inserted peri umbilically. The laparoscope was inserted and the abdomen inspected. No injuries from initial trocar placement were noted. Under direct visualization, an 5-mm trocar was inserted in the left lower quadrant lateral to the rectus muscle. A 5-mm port was then placed above the symphysis pubis on midline.  Care was taken to avoid injury to the bladder or inferior  epigastric vessels. The table was placed in the Trendelenburg position with the right side elevated.  The cecum was gently grasped with an endoscopic graspers and pulled toward (the left upper quadrant). An atraumatic grasper was then passed through the suprapubic port and omentum was dissected away until the appendix was identified. The appendix was then grasped and elevated. It was noted a large mass in the mid appendix with adequate appendix base of normal tissue. An endoscopic linear cutting stapler was then used to divide and staple the base of the appendix.  The mesoappendix was divided with harmonic device. The appendix was placed in an endoscopic retrieval bag and removed.  The appendiceal stump was then irrigated and hemostasis was assured.  No lesions were seen on the liver or the peritoneum.  Pathology called and reported that the appendix mass looks like a mucocele.  No malignant cells were seen on the frozen section and the margins were clear.  At this time it was decided to terminate the procedure and not perform the colectomy Secondary trocars were removed under direct vision. No bleeding was noted. The laparoscope was withdrawn and the umbilical trocar removed. The abdomen was allowed to collapse. All trocar sites greater than 5 mm were closed with Vicryl 0. The skin was closed with subcuticular sutures Monocryl 4-0 of and steristrips.  The patient tolerated the procedure well and was taken to the postanesthesia care unit in satisfactory condition.   Specimen: Appendix  Complications: None  Estimated Blood Loss: 10 mL

## 2018-06-21 NOTE — Anesthesia Preprocedure Evaluation (Addendum)
Anesthesia Evaluation  Patient identified by MRN, date of birth, ID band Patient awake    Reviewed: Allergy & Precautions, NPO status , Patient's Chart, lab work & pertinent test results  History of Anesthesia Complications Negative for: history of anesthetic complications  Airway Mallampati: III       Dental   Pulmonary neg sleep apnea, neg COPD, former smoker,           Cardiovascular hypertension, Pt. on medications (-) Past MI and (-) CHF (-) dysrhythmias + Valvular Problems/Murmurs MVP      Neuro/Psych neg Seizures Depression Bipolar Disorder    GI/Hepatic Neg liver ROS, hiatal hernia, PUD, GERD  Medicated and Controlled,  Endo/Other  neg diabetesHypothyroidism   Renal/GU Renal disease     Musculoskeletal   Abdominal   Peds  Hematology   Anesthesia Other Findings   Reproductive/Obstetrics                            Anesthesia Physical Anesthesia Plan  ASA: III  Anesthesia Plan: General   Post-op Pain Management:    Induction:   PONV Risk Score and Plan: 3 and Ondansetron, Dexamethasone and Midazolam  Airway Management Planned: Oral ETT  Additional Equipment:   Intra-op Plan:   Post-operative Plan:   Informed Consent: I have reviewed the patients History and Physical, chart, labs and discussed the procedure including the risks, benefits and alternatives for the proposed anesthesia with the patient or authorized representative who has indicated his/her understanding and acceptance.       Plan Discussed with:   Anesthesia Plan Comments:         Anesthesia Quick Evaluation

## 2018-06-21 NOTE — H&P (Signed)
PATIENT PROFILE: Stephanie Anthony is a 85 y.o. female who presents to the Clinic for consultation at the request of Dr. Windell Moment for evaluation of appendiceal mass.  PCP: [name], MD  HISTORY OF PRESENT ILLNESS: Ms. Fukuda reports that was previously evaluated at the ED due to abdominal pain. Only CT scan finding was an appendiceal mass that has been there for at least 4 years. I evaluated personally the last CT scan and the previous one from 4 years ago and it shows an increase in size of the mass. The patient had mild generalized abdominal pain that was not localized to the right lower quadrant. Pain did not radiates. There was no aggravator or alleviator factor. When she was initially diagnosed with the mass, Dr. Pat Patrick recommended resection but due to multiple psychiatric admissions, it was lost in the follow up. At ED pain was controlled and she was discharged. She had colonoscopy done by Dr. Gustavo Lah. I personally evaluated the images and the report and discussed the case personally with him. The appendix orifice looked and there was no other masses in the colon. One polyp was tubular adenoma and removed completely.   PROBLEM LIST: Problem List Date Reviewed: 05/04/2018  Noted  Medicare annual wellness visit, initial: 02/02/13 02/19/2017  Vaccine counseling: PNA-23 vaccine administered on 03/12/06; Td vaccine administered on 09/25/14; Pt declines PCV-13 vaccine (06/05/17) 02/19/2017  Medicare annual wellness visit, subsequent 06/05/17 02/19/2017  Status post total right knee replacement 06/22/2015  Noninfective gastroenteritis and colitis 10/17/2014  Personal history of nutritional deficiency 09/27/2014  Mitral valve insufficiency and aortic valve insufficiency 09/27/2014  Pure hypercholesterolemia (LDL 83 - 11/26/17) 09/19/2014  CKD (chronic kidney disease) stage 3, (Cr 1.6, GFR 31 - 11/26/17) - followed by Dr. Holley Raring 09/19/2014  Essential hypertension with goal blood pressure less than 140/90  03/29/2014  Acquired hypothyroidism (TSH 3.6- 11/26/17) 03/29/2014  Mitral regurgitation 03/29/2014  Bipolar affective disorder - followed by psychiatry 03/29/2014  Acquired cyst of kidney 06/18/2012    GENERAL REVIEW OF SYSTEMS:   General ROS: negative for - chills, fatigue, fever, weight gain or weight loss Allergy and Immunology ROS: negative for - hives  Hematological and Lymphatic ROS: negative for - bleeding problems or bruising, negative for palpable nodes Endocrine ROS: negative for - heat or cold intolerance, hair changes Respiratory ROS: negative for - cough, shortness of breath or wheezing Cardiovascular ROS: no chest pain or palpitations GI ROS: negative for nausea, vomiting, diarrhea, Positive for abdominal pain and constipation Musculoskeletal ROS: negative for - joint swelling or muscle pain Neurological ROS: negative for - confusion, syncope Dermatological ROS: negative for pruritus and rash Psychiatric: positive for anxiety, depression, difficulty sleeping and memory loss  MEDICATIONS: Current Outpatient Medications  Medication Sig Dispense Refill  . ARIPiprazole (ABILIFY) 2 MG tablet Take 2 mg by mouth once daily  . cholecalciferol (VITAMIN D3) 1,000 unit tablet Take 1,000 Units by mouth once daily  . lamoTRIgine (LAMICTAL) 100 MG tablet Take 100 mg by mouth nightly.   . latanoprost (XALATAN) 0.005 % ophthalmic solution Place 1 drop into both eyes nightly.  . levothyroxine (SYNTHROID) 88 MCG tablet TAKE ONE TABLET BY MOUTH EVERY MORNING 90 tablet 1  . losartan (COZAAR) 25 MG tablet TAKE ONE TABLET BY MOUTH EVERY DAY 90 tablet 1  . lovastatin (MEVACOR) 40 MG tablet TAKE ONE TABLET BY MOUTH AT BEDTIME 90 tablet 1  . pantoprazole (PROTONIX) 40 MG DR tablet Take 1 tablet (40 mg total) by mouth once daily 30  tablet 11   No current facility-administered medications for this visit.   ALLERGIES: Prednisone intensol [prednisone]; Morphine; Aspirin; Codeine sulfate; and  Valium [diazepam]  PAST MEDICAL HISTORY: Past Medical History:  Diagnosis Date  . Acquired hypothyroidism, unspecified  . Bipolar disorder (CMS-HCC)  type I, followed by [name]  . Cataracts, bilateral  . Chronic kidney disease  Stage III, followed by Dr. Holley Raring  . Colon polyp  . Environmental allergies  smoke, dust, dog dander  . Erosive esophagitis 10/20/2014  . Erosive gastritis 10/20/2014  . Esophageal stricture 11/13/2008  Dr. Kathyrn Sheriff  . Essential hypertension  . Fibroid tumor  . Osteoarthritis  . Pure hypercholesterolemia   PAST SURGICAL HISTORY: Past Surgical History:  Procedure Laterality Date  . BACK SURGERY  For Infection in the back many years ago  . CHOLECYSTECTOMY  . COLONOSCOPY 01/03/2003, 04/07/2006, 01/28/2010, 01/20/2013  Dr. Verdie Shire  . COLONOSCOPY 06/01/2018  Tubular adenoma of the colon  . EGD 10/20/2014  Erosive esophagitis/Erosive gastritis/Repeat 7 weeks/MUS  . EGD 06/01/2018  Gastritis/Duodenitis  . ENDOSCOPY 01/28/2010  Dr. Eddie Dibbles Oh  . ESOPHAGEAL DILATION 11/13/2008  Ju[place]el  . Nose surgery  deviated septum repair  . Right total knee arthroplasty 09/30/2006  Dr. Marry Guan  . THYROIDECTOMY TOTAL 1977    FAMILY HISTORY: Family History  Problem Relation Age of Onset  . Stroke Mother  . Myocardial Infarction (Heart attack) Father  . Gout Sister  . Heart disease Sister    SOCIAL HISTORY: Social History   Socioeconomic History  . Marital status: Widowed  Spouse name: Not on file  . Number of children: 2  . Years of education: 6  . Highest education level: Not on file  Occupational History  . Occupation: Retired  Scientific laboratory technician  . Financial resource strain: Not on file  . Food insecurity:  Worry: Not on file  Inability: Not on file  . Transportation needs:  Medical: Not on file  Non-medical: Not on file  Tobacco Use  . Smoking status: Former Smoker  Packs/day: 0.75  Years: 12.00  Pack years: 9.00  Types: Cigarettes  Last attempt to  quit: 05/19/1962  Years since quitting: 56.0  . Smokeless tobacco: Never Used  . Tobacco comment: started age 79 and quit in 1964  Substance and Sexual Activity  . Alcohol use: No  Alcohol/week: 0.0 standard drinks  . Drug use: Never  . Sexual activity: Defer  Partners: Male  Other Topics Concern  . Not on file  Social History Narrative  . Not on file   PHYSICAL EXAM: Vitals:  06/08/18 1118  BP: 140/71  Pulse: 67   Body mass index is 27.73 kg/m. Weight: 80.3 kg (177 lb 0.5 oz)   GENERAL: Alert, active, oriented x3  HEENT: Pupils equal reactive to light. Extraocular movements are intact. Sclera clear. Palpebral conjunctiva normal red color.Pharynx clear.  NECK: Supple with no palpable mass and no adenopathy.  LUNGS: Sound clear with no rales rhonchi or wheezes.  HEART: Regular rhythm S1 and S2 without murmur.  ABDOMEN: Soft and depressible, nontender with no palpable mass, no hepatomegaly.   EXTREMITIES: Well-developed well-nourished symmetrical with no dependent edema.  NEUROLOGICAL: Awake alert oriented, facial expression symmetrical, moving all extremities.  REVIEW OF DATA: I have reviewed the following data today: Office Visit on 05/04/2018  Component Date Value  . Vent Rate (bpm) 05/04/2018 73  . PR Interval (msec) 05/04/2018 154  . QRS Interval (msec) 05/04/2018 84  . QT Interval (msec) 05/04/2018 384  .  QTc (msec) 05/04/2018 423    ASSESSMENT: Ms. Fabrizio is a 85 y.o. female presenting for consultation for appendiceal mass.   Mass of appendix [K38.8]  Patient with a large mass on the appendix of 3.2 cm. This has increased from previous CT scan. The change is size is slow growing. With the slow growing behavior I discussed with the patient and the son that this may be a benign appendix mass but it cannot be ruled out unless is removed. I recommended to have appendectomy with frozen section. If positive, right colectomy will be performed. If negative and good  margins are able to be achieved, will just perform the appendectomy. I oriented the patient and the son that if I cannot get good margins because the mass involve the base, she might need a right colectomy.   Patient was already evaluated by primary care and GI service. After discussion with patient and son, all agree to proceed with attempt appendectomy with possibility of partial colectomy.   PLAN: 1. Laparoscopic appendectomy (92426) 2. Possible Laparoscopic hand assisted right hemicolectomy (83419) 3. Internal medicine clearance done by Dr. Netty Starring 4. Bowel preparation as instructed the day before surgery   Patient and her son verbalized understanding, all questions were answered, and were agreeable with the plan outlined above.   Herbert Pun, MD

## 2018-06-21 NOTE — Transfer of Care (Signed)
Immediate Anesthesia Transfer of Care Note  Patient: Stephanie Anthony  Procedure(s) Performed: APPENDECTOMY LAPAROSCOPIC (N/A )  Patient Location: PACU  Anesthesia Type:General  Level of Consciousness: awake and alert   Airway & Oxygen Therapy: Patient Spontanous Breathing and Patient connected to face mask oxygen  Post-op Assessment: Report given to RN and Post -op Vital signs reviewed and stable  Post vital signs: Reviewed and stable  Last Vitals:  Vitals Value Taken Time  BP 155/64 06/21/2018  3:10 PM  Temp    Pulse 68 06/21/2018  3:14 PM  Resp 18 06/21/2018  3:14 PM  SpO2 100 % 06/21/2018  3:14 PM  Vitals shown include unvalidated device data.  Last Pain:  Vitals:   06/21/18 1008  TempSrc: Oral  PainSc: 3          Complications: No apparent anesthesia complications

## 2018-06-21 NOTE — Anesthesia Procedure Notes (Signed)
Procedure Name: Intubation Date/Time: 06/21/2018 12:28 PM Performed by: Philbert Riser, CRNA Pre-anesthesia Checklist: Patient identified, Emergency Drugs available, Suction available, Patient being monitored and Timeout performed Patient Re-evaluated:Patient Re-evaluated prior to induction Oxygen Delivery Method: Circle system utilized and Simple face mask Preoxygenation: Pre-oxygenation with 100% oxygen Induction Type: IV induction Ventilation: Mask ventilation without difficulty Laryngoscope Size: Mac and 3 Grade View: Grade II Tube type: Oral Tube size: 7.0 mm Number of attempts: 1 Airway Equipment and Method: Stylet Placement Confirmation: ETT inserted through vocal cords under direct vision,  positive ETCO2 and CO2 detector Secured at: 21 cm Dental Injury: Teeth and Oropharynx as per pre-operative assessment

## 2018-06-22 ENCOUNTER — Encounter: Payer: Self-pay | Admitting: General Surgery

## 2018-06-22 DIAGNOSIS — C181 Malignant neoplasm of appendix: Secondary | ICD-10-CM | POA: Diagnosis not present

## 2018-06-22 LAB — BASIC METABOLIC PANEL
Anion gap: 6 (ref 5–15)
BUN: 17 mg/dL (ref 8–23)
CHLORIDE: 111 mmol/L (ref 98–111)
CO2: 23 mmol/L (ref 22–32)
Calcium: 8.3 mg/dL — ABNORMAL LOW (ref 8.9–10.3)
Creatinine, Ser: 1.91 mg/dL — ABNORMAL HIGH (ref 0.44–1.00)
GFR calc Af Amer: 28 mL/min — ABNORMAL LOW (ref >60)
GFR calc non Af Amer: 24 mL/min — ABNORMAL LOW (ref >60)
Glucose, Bld: 113 mg/dL — ABNORMAL HIGH (ref 70–99)
Potassium: 4.2 mmol/L (ref 3.5–5.1)
Sodium: 140 mmol/L (ref 135–145)

## 2018-06-22 LAB — CBC
HEMATOCRIT: 35.6 % — AB (ref 36.0–46.0)
Hemoglobin: 11.8 g/dL — ABNORMAL LOW (ref 12.0–15.0)
MCH: 26.1 pg (ref 26.0–34.0)
MCHC: 33.1 g/dL (ref 30.0–36.0)
MCV: 78.8 fL — ABNORMAL LOW (ref 80.0–100.0)
Platelets: 180 10*3/uL (ref 150–400)
RBC: 4.52 MIL/uL (ref 3.87–5.11)
RDW: 13.3 % (ref 11.5–15.5)
WBC: 8.8 10*3/uL (ref 4.0–10.5)
nRBC: 0 % (ref 0.0–0.2)

## 2018-06-22 MED ORDER — HYDROCODONE-ACETAMINOPHEN 5-325 MG PO TABS: 1.0000 | ORAL_TABLET | ORAL | 0 refills | Status: AC | PRN

## 2018-06-22 MED ORDER — ENOXAPARIN SODIUM 30 MG/0.3ML ~~LOC~~ SOLN
30.0000 mg | SUBCUTANEOUS | Status: DC
  Administered 2018-06-22: 30 mg via SUBCUTANEOUS
  Filled 2018-06-22: qty 0.3

## 2018-06-22 NOTE — Discharge Instructions (Signed)

## 2018-06-22 NOTE — Care Management Note (Signed)
Case Management Note  Patient Details  Name: Stephanie Anthony MRN: [id number] Date of Birth: 11-15-1933  Subjective/Objective:                 Patient had laparoscopic appendectomy and placed in observation.  Spoke with son [name].  he has had recent surgery and says he will not be of much physical support.  He requested home health service from attending.  CM informed this has been set up with [facility] by MD office.  [name] does not have agency preference. Discussed medicare compare data.  [name] says if [facility] can not see patient within 24 hours of discharge, would want to have that could see patient sooner.   Action/Plan: Contacted [facility] with referral. Can see patient within 24 hours of discharge  Expected Discharge Date:  06/22/18               Expected Discharge Plan:     In-House Referral:     Discharge planning Services     Post Acute Care Choice:    Choice offered to:     DME Arranged:    DME Agency:     HH Arranged:    HH Agency:     Status of Service:     If discussed at [employer], dates discussed:    Additional Comments:  Katrina Stack, RN 06/22/2018, 12:26 PM

## 2018-06-22 NOTE — Care Management Obs Status (Signed)
[place] NOTIFICATION   Patient Details  Name: Stephanie Anthony MRN: [id number] Date of Birth: Jul 18, 1933   Medicare Observation Status Notification Given:  Yes    Katrina Stack, RN 06/22/2018, 5:14 PM

## 2018-06-22 NOTE — Discharge Summary (Signed)
  Patient ID: Stephanie Anthony MRN: [id number] DOB/AGE: 11-10-1933 85 y.o.  Admit date: 06/21/2018 Discharge date: 06/22/2018   Discharge Diagnoses:  Active Problems:   Mass of appendix   Procedures: Laparoscopic appendectomy  Hospital Course: Patient admitted for laparoscopic appendectomy versus partial colectomy due to an appendix mass.  Patient tolerated the procedure well appendectomy was able to be performed with adequate margins.  Preliminary report from pathology refers no malignant cells identified.  Patient with adequate pain control, ambulating and tolerating solid diet.  Home health care was coordinated for assistant at home.  Physical Exam  Constitutional: She is well-developed, well-nourished, and in no distress.  HENT:  Head: Normocephalic.  Cardiovascular: Normal rate and regular rhythm.  Pulmonary/Chest: Effort normal.  Abdominal: Soft. Bowel sounds are normal. She exhibits no distension. There is no abdominal tenderness.  Wounds are dry and clean.  There is a small bruise around the periumbilical incision.  No sign of hematoma   Consults: None  Disposition: Discharge disposition: 01-Home or Self Care       Discharge Instructions    Diet - low sodium heart healthy   Complete by:  As directed    Increase activity slowly   Complete by:  As directed      Allergies as of 06/22/2018      Reactions   Aspirin Other (See Comments)   Rectal bleeding   Codeine Nausea And Vomiting   Prednisone Other (See Comments)   Hallucinations   Valium [diazepam] Other (See Comments)   Strange feelings      Medication List    TAKE these medications   ARIPiprazole 2 MG tablet Commonly known as:  ABILIFY Take 2 mg by mouth at bedtime.   cholecalciferol 25 MCG (1000 UT) tablet Commonly known as:  VITAMIN D Take 1 tablet (1,000 Units total) by mouth daily.   HYDROcodone-acetaminophen 5-325 MG tablet Commonly known as:  NORCO/VICODIN Take 1 tablet by mouth every 4  (four) hours as needed for up to 2 days for moderate pain.   hydrocortisone cream 1 % Apply 1 application topically daily as needed for itching.   lamoTRIgine 100 MG tablet Commonly known as:  LAMICTAL Take 100 mg by mouth at bedtime.   latanoprost 0.005 % ophthalmic solution Commonly known as:  XALATAN Place 1 drop into both eyes at bedtime.   levothyroxine 88 MCG tablet Commonly known as:  SYNTHROID, LEVOTHROID Take 88 mcg by mouth daily before breakfast.   losartan 25 MG tablet Commonly known as:  COZAAR Take 25 mg by mouth daily.   lovastatin 40 MG tablet Commonly known as:  MEVACOR Take 40 mg by mouth at bedtime.   pantoprazole 40 MG tablet Commonly known as:  PROTONIX Take 40 mg by mouth daily.      Follow-up Information    Herbert Pun, MD Follow up in 2 week(s).   Specialty:  General Surgery Contact information: [address] [place] [address] [phone]

## 2018-06-23 ENCOUNTER — Other Ambulatory Visit: Payer: Self-pay | Admitting: Anatomic Pathology & Clinical Pathology

## 2018-06-23 ENCOUNTER — Encounter: Payer: Self-pay | Admitting: General Surgery

## 2018-06-23 LAB — SURGICAL PATHOLOGY

## 2018-06-23 NOTE — Anesthesia Postprocedure Evaluation (Signed)
Anesthesia Post Note  Patient: Stephanie Anthony  Procedure(s) Performed: APPENDECTOMY LAPAROSCOPIC (N/A )  Patient location during evaluation: PACU Anesthesia Type: General Level of consciousness: awake and alert Pain management: pain level controlled Vital Signs Assessment: post-procedure vital signs reviewed and stable Respiratory status: spontaneous breathing and respiratory function stable Cardiovascular status: stable Anesthetic complications: no     Last Vitals:  Vitals:   06/22/18 0901 06/22/18 1131  BP: (!) 104/43 (!) 104/46  Pulse: 72 60  Resp: 16 16  Temp: 36.9 C 36.8 C  SpO2: 99% 99%    Last Pain:  Vitals:   06/22/18 1131  TempSrc: Oral  PainSc:                  Alira Fretwell K

## 2018-07-01 ENCOUNTER — Other Ambulatory Visit: Payer: Medicare Other

## 2018-07-01 NOTE — Progress Notes (Signed)
Tumor Board Documentation  Stephanie Anthony was presented by Dr Windell Moment at our Tumor Board on 07/01/2018, which included representatives from medical oncology, radiation oncology, surgical oncology, surgical, radiology, pathology, internal medicine, research.  Stephanie Anthony currently presents for new positive pathology, for discussion, for Stephanie Anthony with history of the following treatments: surgical intervention(s).  Additionally, we reviewed previous medical and familial history, history of present illness, and recent lab results along with all available histopathologic and imaging studies. The tumor board considered available treatment options and made the following recommendations: Additional observation  Patient would not benefit from right colectomy, possibly follow tumor markers  The following procedures/referrals were also placed: No orders of the defined types were placed in this encounter.   Clinical Trial Status: not discussed   Staging used: AJCC Stage Group  AJCC Staging: T: pT4a N: pNx      National site-specific guidelines NCCN were discussed with respect to the case.  Tumor board is a meeting of clinicians from various specialty areas who evaluate and discuss patients for whom a multidisciplinary approach is being considered. Final determinations in the plan of care are those of the provider(s). The responsibility for follow up of recommendations given during tumor board is that of the provider.   Today's extended care, comprehensive team conference, Stephanie Anthony was not present for the discussion and was not examined.   Multidisciplinary Tumor Board is a multidisciplinary case peer review process.  Decisions discussed in the Multidisciplinary Tumor Board reflect the opinions of the specialists present at the conference without having examined the patient.  Ultimately, treatment and diagnostic decisions rest with the primary provider(s) and the patient.

## 2018-12-15 ENCOUNTER — Other Ambulatory Visit (HOSPITAL_COMMUNITY): Payer: Self-pay | Admitting: General Surgery

## 2018-12-15 ENCOUNTER — Other Ambulatory Visit: Payer: Self-pay | Admitting: General Surgery

## 2018-12-15 DIAGNOSIS — Z9049 Acquired absence of other specified parts of digestive tract: Secondary | ICD-10-CM

## 2018-12-15 DIAGNOSIS — K388 Other specified diseases of appendix: Secondary | ICD-10-CM

## 2018-12-22 ENCOUNTER — Encounter (INDEPENDENT_AMBULATORY_CARE_PROVIDER_SITE_OTHER): Payer: Self-pay

## 2018-12-22 ENCOUNTER — Other Ambulatory Visit: Payer: Self-pay

## 2018-12-22 ENCOUNTER — Ambulatory Visit
Admission: RE | Admit: 2018-12-22 | Discharge: 2018-12-22 | Disposition: A | Discharge status: Home / Self Care | Payer: Medicare Other | Source: Ambulatory Visit | Attending: General Surgery | Admitting: General Surgery

## 2018-12-22 DIAGNOSIS — Z9049 Acquired absence of other specified parts of digestive tract: Secondary | ICD-10-CM | POA: Diagnosis present

## 2018-12-22 DIAGNOSIS — K388 Other specified diseases of appendix: Secondary | ICD-10-CM | POA: Insufficient documentation

## 2018-12-24 ENCOUNTER — Other Ambulatory Visit: Payer: Self-pay | Admitting: General Surgery

## 2018-12-24 DIAGNOSIS — Z9049 Acquired absence of other specified parts of digestive tract: Secondary | ICD-10-CM

## 2018-12-24 DIAGNOSIS — K388 Other specified diseases of appendix: Secondary | ICD-10-CM

## 2019-02-25 DIAGNOSIS — R809 Proteinuria, unspecified: Secondary | ICD-10-CM | POA: Insufficient documentation

## 2019-02-25 DIAGNOSIS — E232 Diabetes insipidus: Secondary | ICD-10-CM | POA: Insufficient documentation

## 2019-09-26 DIAGNOSIS — G252 Other specified forms of tremor: Secondary | ICD-10-CM | POA: Insufficient documentation

## 2019-10-27 DIAGNOSIS — K219 Gastro-esophageal reflux disease without esophagitis: Secondary | ICD-10-CM | POA: Insufficient documentation

## 2020-02-04 ENCOUNTER — Observation Stay
Admission: EM | Admit: 2020-02-04 | Discharge: 2020-02-05 | Disposition: A | Discharge status: Home / Self Care | Payer: Medicare Other | Attending: Family Medicine | Admitting: Family Medicine

## 2020-02-04 ENCOUNTER — Other Ambulatory Visit: Payer: Self-pay

## 2020-02-04 ENCOUNTER — Emergency Department: Payer: Medicare Other

## 2020-02-04 DIAGNOSIS — Z20822 Contact with and (suspected) exposure to covid-19: Secondary | ICD-10-CM | POA: Insufficient documentation

## 2020-02-04 DIAGNOSIS — I129 Hypertensive chronic kidney disease with stage 1 through stage 4 chronic kidney disease, or unspecified chronic kidney disease: Secondary | ICD-10-CM | POA: Insufficient documentation

## 2020-02-04 DIAGNOSIS — I10 Essential (primary) hypertension: Secondary | ICD-10-CM | POA: Diagnosis present

## 2020-02-04 DIAGNOSIS — E039 Hypothyroidism, unspecified: Secondary | ICD-10-CM | POA: Diagnosis not present

## 2020-02-04 DIAGNOSIS — F319 Bipolar disorder, unspecified: Secondary | ICD-10-CM | POA: Diagnosis not present

## 2020-02-04 DIAGNOSIS — N183 Chronic kidney disease, stage 3 unspecified: Secondary | ICD-10-CM | POA: Diagnosis not present

## 2020-02-04 DIAGNOSIS — I2510 Atherosclerotic heart disease of native coronary artery without angina pectoris: Secondary | ICD-10-CM | POA: Diagnosis not present

## 2020-02-04 DIAGNOSIS — E785 Hyperlipidemia, unspecified: Secondary | ICD-10-CM | POA: Diagnosis present

## 2020-02-04 DIAGNOSIS — R55 Syncope and collapse: Secondary | ICD-10-CM | POA: Diagnosis not present

## 2020-02-04 DIAGNOSIS — I080 Rheumatic disorders of both mitral and aortic valves: Secondary | ICD-10-CM | POA: Diagnosis present

## 2020-02-04 DIAGNOSIS — R001 Bradycardia, unspecified: Secondary | ICD-10-CM | POA: Diagnosis present

## 2020-02-04 DIAGNOSIS — Z96651 Presence of right artificial knee joint: Secondary | ICD-10-CM | POA: Diagnosis not present

## 2020-02-04 DIAGNOSIS — R42 Dizziness and giddiness: Secondary | ICD-10-CM

## 2020-02-04 DIAGNOSIS — R002 Palpitations: Secondary | ICD-10-CM | POA: Diagnosis not present

## 2020-02-04 DIAGNOSIS — H8112 Benign paroxysmal vertigo, left ear: Secondary | ICD-10-CM

## 2020-02-04 DIAGNOSIS — Z87891 Personal history of nicotine dependence: Secondary | ICD-10-CM | POA: Diagnosis not present

## 2020-02-04 DIAGNOSIS — Z79899 Other long term (current) drug therapy: Secondary | ICD-10-CM | POA: Insufficient documentation

## 2020-02-04 LAB — COMPREHENSIVE METABOLIC PANEL
ALT: 17 U/L (ref 0–44)
AST: 28 U/L (ref 15–41)
Albumin: 3.8 g/dL (ref 3.5–5.0)
Alkaline Phosphatase: 38 U/L (ref 38–126)
Anion gap: 15 (ref 5–15)
BUN: 19 mg/dL (ref 8–23)
CO2: 20 mmol/L — ABNORMAL LOW (ref 22–32)
Calcium: 9.5 mg/dL (ref 8.9–10.3)
Chloride: 105 mmol/L (ref 98–111)
Creatinine, Ser: 1.81 mg/dL — ABNORMAL HIGH (ref 0.44–1.00)
GFR calc Af Amer: 29 mL/min — ABNORMAL LOW (ref >60)
GFR calc non Af Amer: 25 mL/min — ABNORMAL LOW (ref >60)
Glucose, Bld: 133 mg/dL — ABNORMAL HIGH (ref 70–99)
Potassium: 3.7 mmol/L (ref 3.5–5.1)
Sodium: 140 mmol/L (ref 135–145)
Total Bilirubin: 0.9 mg/dL (ref 0.3–1.2)
Total Protein: 6.8 g/dL (ref 6.5–8.1)

## 2020-02-04 LAB — CBC
HCT: 37.2 % (ref 36.0–46.0)
Hemoglobin: 12.6 g/dL (ref 12.0–15.0)
MCH: 26.8 pg (ref 26.0–34.0)
MCHC: 33.9 g/dL (ref 30.0–36.0)
MCV: 79 fL — ABNORMAL LOW (ref 80.0–100.0)
Platelets: 173 10*3/uL (ref 150–400)
RBC: 4.71 MIL/uL (ref 3.87–5.11)
RDW: 14.1 % (ref 11.5–15.5)
WBC: 7.9 10*3/uL (ref 4.0–10.5)
nRBC: 0 % (ref 0.0–0.2)

## 2020-02-04 LAB — SARS CORONAVIRUS 2 BY RT PCR (HOSPITAL ORDER, PERFORMED IN ~~LOC~~ HOSPITAL LAB): SARS Coronavirus 2: NEGATIVE

## 2020-02-04 LAB — CREATININE, SERUM
Creatinine, Ser: 1.7 mg/dL — ABNORMAL HIGH (ref 0.44–1.00)
GFR calc Af Amer: 32 mL/min — ABNORMAL LOW (ref >60)
GFR calc non Af Amer: 27 mL/min — ABNORMAL LOW (ref >60)

## 2020-02-04 LAB — LIPASE, BLOOD: Lipase: 48 U/L (ref 11–51)

## 2020-02-04 LAB — TROPONIN I (HIGH SENSITIVITY)
Troponin I (High Sensitivity): 8 ng/L (ref <18)
Troponin I (High Sensitivity): 29 ng/L — ABNORMAL HIGH (ref <18)
Troponin I (High Sensitivity): 31 ng/L — ABNORMAL HIGH (ref <18)

## 2020-02-04 LAB — MAGNESIUM: Magnesium: 2 mg/dL (ref 1.7–2.4)

## 2020-02-04 LAB — TSH: TSH: 2.77 u[IU]/mL (ref 0.350–4.500)

## 2020-02-04 MED ORDER — ACETAMINOPHEN 325 MG PO TABS
650.0000 mg | ORAL_TABLET | Freq: Four times a day (QID) | ORAL | Status: DC | PRN
  Filled 2020-02-04: qty 2

## 2020-02-04 MED ORDER — MECLIZINE HCL 25 MG PO TABS
25.0000 mg | ORAL_TABLET | Freq: Once | ORAL | Status: AC
  Administered 2020-02-04: 25 mg via ORAL
  Filled 2020-02-04: qty 1

## 2020-02-04 MED ORDER — ARIPIPRAZOLE 2 MG PO TABS
2.0000 mg | ORAL_TABLET | Freq: Every day | ORAL | Status: DC
  Administered 2020-02-04: 2 mg via ORAL
  Filled 2020-02-04 (×3): qty 1

## 2020-02-04 MED ORDER — LAMOTRIGINE 100 MG PO TABS
100.0000 mg | ORAL_TABLET | Freq: Every day | ORAL | Status: DC
  Administered 2020-02-04: 100 mg via ORAL
  Filled 2020-02-04: qty 1

## 2020-02-04 MED ORDER — ONDANSETRON HCL 4 MG PO TABS: 4.0000 mg | ORAL_TABLET | Freq: Four times a day (QID) | ORAL | Status: DC | PRN

## 2020-02-04 MED ORDER — SODIUM CHLORIDE 0.9 % IV SOLN: INTRAVENOUS | Status: DC

## 2020-02-04 MED ORDER — ONDANSETRON HCL 4 MG/2ML IJ SOLN
4.0000 mg | Freq: Once | INTRAMUSCULAR | Status: AC
  Administered 2020-02-04: 4 mg via INTRAVENOUS
  Filled 2020-02-04: qty 2

## 2020-02-04 MED ORDER — HEPARIN SODIUM (PORCINE) 5000 UNIT/ML IJ SOLN
5000.0000 [IU] | Freq: Three times a day (TID) | INTRAMUSCULAR | Status: DC
  Administered 2020-02-04 – 2020-02-05 (×2): 5000 [IU] via SUBCUTANEOUS
  Filled 2020-02-04: qty 1

## 2020-02-04 MED ORDER — SODIUM CHLORIDE 0.9 % IV SOLN
1000.0000 mL | Freq: Once | INTRAVENOUS | Status: AC
  Administered 2020-02-04: 1000 mL via INTRAVENOUS

## 2020-02-04 MED ORDER — MECLIZINE HCL 12.5 MG PO TABS
12.5000 mg | ORAL_TABLET | Freq: Three times a day (TID) | ORAL | Status: DC | PRN
  Filled 2020-02-04 (×2): qty 1

## 2020-02-04 MED ORDER — ONDANSETRON HCL 4 MG/2ML IJ SOLN: 4.0000 mg | Freq: Four times a day (QID) | INTRAMUSCULAR | Status: DC | PRN

## 2020-02-04 MED ORDER — PANTOPRAZOLE SODIUM 40 MG PO TBEC
40.0000 mg | DELAYED_RELEASE_TABLET | Freq: Every day | ORAL | Status: DC
  Administered 2020-02-04 – 2020-02-05 (×2): 40 mg via ORAL
  Filled 2020-02-04 (×2): qty 1

## 2020-02-04 MED ORDER — LATANOPROST 0.005 % OP SOLN
1.0000 [drp] | Freq: Every day | OPHTHALMIC | Status: DC
  Administered 2020-02-04: 1 [drp] via OPHTHALMIC
  Filled 2020-02-04 (×2): qty 2.5

## 2020-02-04 MED ORDER — LEVOTHYROXINE SODIUM 88 MCG PO TABS
88.0000 ug | ORAL_TABLET | Freq: Every day | ORAL | Status: DC
  Administered 2020-02-05: 88 ug via ORAL
  Filled 2020-02-04 (×2): qty 1

## 2020-02-04 MED ORDER — VITAMIN D3 25 MCG (1000 UNIT) PO TABS
1000.0000 [IU] | ORAL_TABLET | Freq: Every day | ORAL | Status: DC
  Administered 2020-02-05: 1000 [IU] via ORAL
  Filled 2020-02-04 (×2): qty 1

## 2020-02-04 MED ORDER — ACETAMINOPHEN 650 MG RE SUPP: 650.0000 mg | Freq: Four times a day (QID) | RECTAL | Status: DC | PRN

## 2020-02-04 NOTE — H&P (Signed)
History and Physical    Stephanie Anthony OAC:[id number] DOB: 11/30/1933 DOA: 02/04/2020  PCP: [name], MD  Patient coming from: Home  I have personally briefly reviewed patient's old medical records in [place]  Chief Complaint: Dizziness, nausea vomiting  HPI: Stephanie Anthony is a 86 y.o. female with medical history significant of bipolar disorder, mitral valve disease, CAD, hypertension, bradycardia, GERD and multiple comorbidities as listed below presented to ED with complaint of dizziness.  Patient lives alone.  According to patient, at around 9 AM this morning, while she was laying in her recliner, she initially felt very hot followed by sweating and then she suddenly felt dizzy.  She tried to get out of the recliner multiple times but she continued to feel dizzy with any sort of movement and then she had couple of episodes of vomiting.  She was finally able to get to her phone and called her son and he brought her to the emergency department.  Patient denies having had any chest pain, shortness of breath, abdominal pain, any problem with urination or with bowel movements.   ED Course: Upon arrival to ED, patient was bradycardic with near normal blood pressure.  CT head was done which was unremarkable.  CBC within normal range.  BMP at her baseline.  EKG showed sinus bradycardia.  Despite of getting Antivert in the ED, she was still dizzy and having imbalance.  She lives alone so hospital service were consulted to admit the patient for observation.  COVID-19 test is pending.  Review of Systems: As per HPI otherwise negative.    Past Medical History:  Diagnosis Date  . Arthritis   . Bipolar 1 disorder ([place])   . Cataracts, bilateral   . Chronic kidney disease    stage 3  . Chronic kidney disease   . Chronic mitral valve disease   . Coronary artery disease   . Deviated nasal septum   . Erosive esophagitis   . Erosive esophagitis   . Esophageal stricture   .  Fibroid tumor   . GERD (gastroesophageal reflux disease)   . History of hiatal hernia   . Hypertension   . Hypothyroidism   . Murmur, cardiac   . 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 Pure hypercholesterolemia   . Renal cyst, left    bilateral  . Seasonal allergies     Past Surgical History:  Procedure Laterality Date  . CARDIAC CATHETERIZATION    . CHOLECYSTECTOMY    . COLONOSCOPY WITH PROPOFOL N/A 06/01/2018   Procedure: COLONOSCOPY WITH PROPOFOL;  Surgeon: [name], MD;  Location: [facility] ENDOSCOPY;  Service: Endoscopy;  Laterality: N/A;  . ESOPHAGEAL DILATION    . ESOPHAGOGASTRODUODENOSCOPY (EGD) WITH PROPOFOL N/A 10/20/2014   Procedure: ESOPHAGOGASTRODUODENOSCOPY (EGD) WITH PROPOFOL;  Surgeon: [name], MD;  Location[facility] ENDOSCOPY;  Service: Endoscopy;  Laterality: N/A;  . ESOPHAGOGASTRODUODENOSCOPY (EGD) WITH PROPOFOL N/A 06/01/2018   Procedure: ESOPHAGOGASTRODUODENOSCOPY (EGD) WITH PROPOFOL;  Surgeon: [name], MD;  Location: [facility] ENDOSCOPY;  Service: Endoscopy;  Laterality: N/A;  . INFECTED SKIN DEBRIDEMENT     on back several years ago  . JOINT REPLACEMENT    . knee replacement, right    . LAPAROSCOPIC APPENDECTOMY N/A 06/21/2018   Procedure: APPENDECTOMY LAPAROSCOPIC;  Surgeon: Herbert Pun, MD;  Location: [facility] ORS;  Service: General;  Laterality: N/A;  . NOSE SURGERY    . THYROIDECTOMY    . TOTAL THYROIDECTOMY       reports  that she has quit smoking. She has never used smokeless tobacco. She reports that she does not drink alcohol and does not use drugs.  Allergies  Allergen Reactions  . Aspirin Other (See Comments)    Rectal bleeding  . Codeine Nausea And Vomiting  . Prednisone Other (See Comments)    Hallucinations  . Valium [Diazepam] Other (See Comments)    Strange feelings    Family History  Problem Relation Age of Onset  . Heart Problems Mother   . Seizures Mother   . Stroke Mother   . Diverticulitis Mother   .  Heart attack Father   . Heart attack Sister   . Pancreatitis Sister   . Cancer Sister   . Cancer Brother     Prior to Admission medications   Medication Sig Start Date End Date Taking? Authorizing Provider  ARIPiprazole (ABILIFY) 2 MG tablet Take 2 mg by mouth at bedtime.     [provider]  cholecalciferol (VITAMIN D) 1000 UNITS tablet Take 1 tablet (1,000 Units total) by mouth daily. 10/22/14   Fritzi Mandes, MD  hydrocortisone cream 1 % Apply 1 application topically daily as needed for itching.    [provider]  lamoTRIgine (LAMICTAL) 100 MG tablet Take 100 mg by mouth at bedtime.     [provider]  latanoprost (XALATAN) 0.005 % ophthalmic solution Place 1 drop into both eyes at bedtime.    [provider]  levothyroxine (SYNTHROID, LEVOTHROID) 88 MCG tablet Take 88 mcg by mouth daily before breakfast.  10/30/14 06/14/18  [provider]  losartan (COZAAR) 25 MG tablet Take 25 mg by mouth daily.    [provider]  lovastatin (MEVACOR) 40 MG tablet Take 40 mg by mouth at bedtime.  12/06/14   [provider]  pantoprazole (PROTONIX) 40 MG tablet Take 40 mg by mouth daily.     [provider]    Physical Exam: Vitals:   02/04/20 1400 02/04/20 1430 02/04/20 1500 02/04/20 1530  BP: (!) 130/58 (!) 122/57 116/80 130/60  Pulse: (!) 54 (!) 53 (!) 51 (!) 49  Resp: 15 17 13 19   Temp:      TempSrc:      SpO2: 93% 97% 94% 98%  Weight:      Height:        Constitutional: NAD, calm, comfortable Vitals:   02/04/20 1400 02/04/20 1430 02/04/20 1500 02/04/20 1530  BP: (!) 130/58 (!) 122/57 116/80 130/60  Pulse: (!) 54 (!) 53 (!) 51 (!) 49  Resp: 15 17 13 19   Temp:      TempSrc:      SpO2: 93% 97% 94% 98%  Weight:      Height:       Eyes: PERRL, lids and conjunctivae normal ENMT: Mucous membranes are moist. Posterior pharynx clear of any exudate or lesions.Normal dentition.  Neck: normal, supple, no masses, no  thyromegaly Respiratory: clear to auscultation bilaterally, no wheezing, no crackles. Normal respiratory effort. No accessory muscle use.  Cardiovascular: Regular rate and rhythm, no murmurs / rubs / gallops. No extremity edema. 2+ pedal pulses. No carotid bruits.  Abdomen: no tenderness, no masses palpated. No hepatosplenomegaly. Bowel sounds positive.  Musculoskeletal: no clubbing / cyanosis. No joint deformity upper and lower extremities. Good ROM, no contractures. Normal muscle tone.  Skin: no rashes, lesions, ulcers. No induration Neurologic: CN 2-12 grossly intact. Sensation intact, DTR normal. Strength 5/5 in all 4.  Psychiatric: Normal judgment and insight. Alert and  oriented x 3. Normal mood.    Labs on Admission: I have personally reviewed following labs and imaging studies  CBC: Recent Labs  Lab 02/04/20 1322  WBC 7.9  HGB 12.6  HCT 37.2  MCV 79.0*  PLT 025   Basic Metabolic Panel: Recent Labs  Lab 02/04/20 1322  NA 140  K 3.7  CL 105  CO2 20*  GLUCOSE 133*  BUN 19  CREATININE 1.81*  CALCIUM 9.5   GFR: Estimated Creatinine Clearance: 26.2 mL/min (A) (by C-G formula based on SCr of 1.81 mg/dL (H)). Liver Function Tests: Recent Labs  Lab 02/04/20 1322  AST 28  ALT 17  ALKPHOS 38  BILITOT 0.9  PROT 6.8  ALBUMIN 3.8   Recent Labs  Lab 02/04/20 1322  LIPASE 48   No results for input(s): AMMONIA in the last 168 hours. Coagulation Profile: No results for input(s): INR, PROTIME in the last 168 hours. Cardiac Enzymes: No results for input(s): CKTOTAL, CKMB, CKMBINDEX, TROPONINI in the last 168 hours. BNP (last 3 results) No results for input(s): PROBNP in the last 8760 hours. HbA1C: No results for input(s): HGBA1C in the last 72 hours. CBG: No results for input(s): GLUCAP in the last 168 hours. Lipid Profile: No results for input(s): CHOL, HDL, LDLCALC, TRIG, CHOLHDL, LDLDIRECT in the last 72 hours. Thyroid Function Tests: No results for input(s):  TSH, T4TOTAL, FREET4, T3FREE, THYROIDAB in the last 72 hours. Anemia Panel: No results for input(s): VITAMINB12, FOLATE, FERRITIN, TIBC, IRON, RETICCTPCT in the last 72 hours. Urine analysis:    Component Value Date/Time   COLORURINE YELLOW (A) 04/30/2018 1216   APPEARANCEUR CLOUDY (A) 04/30/2018 1216   APPEARANCEUR Clear 09/11/2014 1457   LABSPEC 1.005 04/30/2018 1216   LABSPEC 1.004 09/11/2014 1457   PHURINE 6.0 04/30/2018 1216   GLUCOSEU NEGATIVE 04/30/2018 1216   GLUCOSEU Negative 09/11/2014 1457   HGBUR MODERATE (A) 04/30/2018 1216   BILIRUBINUR NEGATIVE 04/30/2018 1216   BILIRUBINUR Negative 09/11/2014 1457   KETONESUR NEGATIVE 04/30/2018 1216   PROTEINUR NEGATIVE 04/30/2018 1216   NITRITE NEGATIVE 04/30/2018 1216   LEUKOCYTESUR LARGE (A) 04/30/2018 1216   LEUKOCYTESUR Negative 09/11/2014 1457    Radiological Exams on Admission: CT Head Wo Contrast  Result Date: 02/04/2020 CLINICAL DATA:  Sudden onset dizziness and vomiting with diaphoresis today. EXAM: CT HEAD WITHOUT CONTRAST TECHNIQUE: Contiguous axial images were obtained from the base of the skull through the vertex without intravenous contrast. COMPARISON:  09/11/2014 FINDINGS: Brain: Ventricles, cisterns and other CSF spaces are normal. There is no mass, mass effect, shift of midline structures or acute hemorrhage. No evidence of acute infarction. There is chronic ischemic microvascular disease. Vascular: No hyperdense vessel or unexpected calcification. Skull: Normal. Negative for fracture or focal lesion. Sinuses/Orbits: No acute finding. Other: None. IMPRESSION: 1. No acute findings. 2. Chronic ischemic microvascular disease. Electronically Signed   By: Marin Olp M.D.   On: 02/04/2020 16:06    EKG: Independently reviewed.  Sinus bradycardia, ventricular rate 52 bpm.  Very mild and nonspecific ST changes.  Assessment/Plan Active Problems:   Acquired hypothyroidism   Bipolar affective disorder (HCC)    Bradycardia   Essential (primary) hypertension   HLD (hyperlipidemia)   Mitral and aortic incompetence   Near syncope   Vertigo/near syncope: On exam, patient does not have nystagmus.  Differential diagnosis includes BPPV or vestibular neuritis.  Less likely secondary to bradycardia as she carries a history of bradycardia and her heart rate is in 50s.  She  has no chest pain.  We will admit her and monitor on telemetry for that.  One troponin has been negative, will follow with 2 cardiac enzymes.   Although chances of BPPV are very low but I were like for her to be evaluated by PT to assess for BPPV and perform vestibular tests.  I will also start her on as needed meclizine.  If her vertigo persists even after that, vestibular neuritis should be considered as a potential diagnosis and she can be started on steroids after that.  I do not see any echo in the chart.  Will order 1 to rule out aortic stenosis.  CKD stage IV: At her baseline.  Monitor.  Essential hypertension: Blood pressure normal.  I will hold her antihypertensives for now.  Acquired hypothyroidism: Check TSH.  Resume home dose of Synthroid.  DVT prophylaxis: heparin injection 5,000 Units Start: 02/04/20 1745 Code Status: Full code Family Communication: Son present at bedside.  Plan of care discussed with patient in length and he verbalized understanding and agreed with it. Disposition Plan: Potential discharge tomorrow Consults called: None Admission status: Observation   Status is: Observation  The patient remains OBS appropriate and will d/c before 2 midnights.  Dispo: The patient is from: Home              Anticipated d/c is to: Home              Anticipated d/c date is: 1 day              Patient currently is not medically stable to d/c.       Darliss Cheney MD [facility]  02/04/2020, 5:34 PM  To contact the attending provider between 7A-7P or the covering provider during after hours 7P-7A, please log  into the web site [email or web address]

## 2020-02-04 NOTE — ED Notes (Addendum)
Lab called with critical value of Troponin 29. Rufina Falco NP notified. Awaiting response from provider at this time.

## 2020-02-04 NOTE — ED Notes (Signed)
Informed IT trainer of bed assignment

## 2020-02-04 NOTE — ED Triage Notes (Signed)
Pt to ED via [facility] from home. Per EMS pt had sudden onset of dizziness and vomiting. Pt diaphoretic upon EMS arrival. Per EMS pt had a near syncopal episode upon sitting up. 24ml and 4mg  zofran given in route.

## 2020-02-04 NOTE — ED Provider Notes (Signed)
Assumed care from Dr. Corky Downs at 3 PM. Briefly, the patient is a 86 y.o. 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 here with weakness, near syncope x 3. H/o vertigo so given meclizine, plan to reasses.   Labs Reviewed  COMPREHENSIVE METABOLIC PANEL - Abnormal; Notable for the following components:      Result Value   CO2 20 (*)    Glucose, Bld 133 (*)    Creatinine, Ser 1.81 (*)    GFR calc non Af Amer 25 (*)    GFR calc Af Amer 29 (*)    All other components within normal limits  CBC - Abnormal; Notable for the following components:   MCV 79.0 (*)    All other components within normal limits  SARS CORONAVIRUS 2 BY RT PCR (HOSPITAL ORDER, [facility])  LIPASE, BLOOD  URINALYSIS, COMPLETE (UACMP) WITH MICROSCOPIC  CBC  CREATININE, SERUM  MAGNESIUM  TSH  BASIC METABOLIC PANEL  CBC  TROPONIN I (HIGH SENSITIVITY)  TROPONIN I (HIGH SENSITIVITY)    Course of Care: Pt remains weak, near syncopal after meds. Having difficulty ambulating in ED. No vertiginous sx at rest, no cerebellar findings on exam and CT head neg. Admit for recurrent near syncope and weakness.     Duffy Bruce, MD 02/04/20 [phone]

## 2020-02-04 NOTE — ED Notes (Signed)
Pt eating graham crackers and drinking water, watching tv in no acute distress.

## 2020-02-04 NOTE — ED Provider Notes (Signed)
[facility] Emergency Department Provider Note   ____________________________________________    I have reviewed the triage vital signs and the nursing notes.   HISTORY  Chief Complaint Loss of Consciousness and Emesis     HPI Stephanie Anthony is a 86 y.o. female with a history of CKD, bipolar disorder presents with complaints of dizziness.  Patient reports she has had episodic dizziness for the last 50 years.  Patient notes that she was sitting on the couch when all of a sudden she developed dizziness which she describes as a sensation of the room moving.  She states it is worse when she moves her head.  When this happens she needs to lie down and hold still sometimes it passes but other times it continues for some time.  She denies chest pain.  No neuro deficits.  No palpitations.  Has not take anything for this.  No headache.  Past Medical History:  Diagnosis Date  . Arthritis   . Bipolar 1 disorder ([place])   . Cataracts, bilateral   . Chronic kidney disease    stage 3  . Chronic kidney disease   . Chronic mitral valve disease   . Coronary artery disease   . Deviated nasal septum   . Erosive esophagitis   . Erosive esophagitis   . Esophageal stricture   . Fibroid tumor   . GERD (gastroesophageal reflux disease)   . History of hiatal hernia   . Hypertension   . Hypothyroidism   . Murmur, cardiac   . 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 Pure hypercholesterolemia   . Renal cyst, left    bilateral  . Seasonal allergies     Patient Active Problem List   Diagnosis Date Noted  . Mass of appendix 06/21/2018  . Anorexia 07/09/2015  . Severe bipolar I disorder with depression ([place]) 11/16/2014  . Enteritis 10/17/2014  . Gastroenteritis, non-infectious 10/17/2014  . Chronic kidney disease (CKD), stage III (moderate) 09/27/2014  . 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  . H/O nutritional disorder 09/27/2014  . HLD  (hyperlipidemia) 09/27/2014  . Heart valve incompetence 09/27/2014  . Mitral and aortic incompetence 09/27/2014  . Personal history of other endocrine, nutritional and metabolic disease 27/74/1287  . Hypothyroidism, postop 09/27/2014  . Bipolar disorder, current episode depressed, severe, without psychotic features ([place])   . Bipolar affective disorder, current episode depressed ([place]) 09/25/2014  . Bipolar I disorder, most recent episode depressed ([place]) 09/25/2014  . Chronic kidney disease, stage IV (severe) ([place]) 09/19/2014  . Pure hypercholesterolemia 09/19/2014  . Acquired hypothyroidism 03/29/2014  . Bipolar affective disorder ([employer]) 03/29/2014  . Bradycardia 03/29/2014  . Essential (primary) hypertension 03/29/2014  . MI (mitral incompetence) 03/29/2014  . Acquired cyst of kidney 06/18/2012    Past Surgical History:  Procedure Laterality Date  . CARDIAC CATHETERIZATION    . CHOLECYSTECTOMY    . COLONOSCOPY WITH PROPOFOL N/A 06/01/2018   Procedure: COLONOSCOPY WITH PROPOFOL;  Surgeon: [name], MD;  Location: [facility] ENDOSCOPY;  Service: Endoscopy;  Laterality: N/A;  . ESOPHAGEAL DILATION    . ESOPHAGOGASTRODUODENOSCOPY (EGD) WITH PROPOFOL N/A 10/20/2014   Procedure: ESOPHAGOGASTRODUODENOSCOPY (EGD) WITH PROPOFOL;  Surgeon: [name], MD;  Location: [facility] ENDOSCOPY;  Service: Endoscopy;  Laterality: N/A;  . ESOPHAGOGASTRODUODENOSCOPY (EGD) WITH PROPOFOL N/A 06/01/2018   Procedure: ESOPHAGOGASTRODUODENOSCOPY (EGD) WITH PROPOFOL;  Surgeon: [name], MD;  Location: [facility] ENDOSCOPY;  Service: Endoscopy;  Laterality: N/A;  . INFECTED SKIN DEBRIDEMENT  on back several years ago  . JOINT REPLACEMENT    . knee replacement, right    . LAPAROSCOPIC APPENDECTOMY N/A 06/21/2018   Procedure: APPENDECTOMY LAPAROSCOPIC;  Surgeon: Herbert Pun, MD;  Location: [facility] ORS;  Service: General;  Laterality: N/A;  . NOSE SURGERY    . THYROIDECTOMY    . TOTAL THYROIDECTOMY       Prior to Admission medications   Medication Sig Start Date End Date Taking? Authorizing Provider  ARIPiprazole (ABILIFY) 2 MG tablet Take 2 mg by mouth at bedtime.     [provider]  cholecalciferol (VITAMIN D) 1000 UNITS tablet Take 1 tablet (1,000 Units total) by mouth daily. 10/22/14   Fritzi Mandes, MD  hydrocortisone cream 1 % Apply 1 application topically daily as needed for itching.    [provider]  lamoTRIgine (LAMICTAL) 100 MG tablet Take 100 mg by mouth at bedtime.     [provider]  latanoprost (XALATAN) 0.005 % ophthalmic solution Place 1 drop into both eyes at bedtime.    [provider]  levothyroxine (SYNTHROID, LEVOTHROID) 88 MCG tablet Take 88 mcg by mouth daily before breakfast.  10/30/14 06/14/18  [provider]  losartan (COZAAR) 25 MG tablet Take 25 mg by mouth daily.    [provider]  lovastatin (MEVACOR) 40 MG tablet Take 40 mg by mouth at bedtime.  12/06/14   [provider]  pantoprazole (PROTONIX) 40 MG tablet Take 40 mg by mouth daily.     [provider]     Allergies Aspirin, Codeine, Prednisone, and Valium [diazepam]  Family History  Problem Relation Age of Onset  . Heart Problems Mother   . Seizures Mother   . Stroke Mother   . Diverticulitis Mother   . Heart attack Father   . Heart attack Sister   . Pancreatitis Sister   . Cancer Sister   . Cancer Brother     Social History Social History   Tobacco Use  . Smoking status: Former Research scientist (life sciences)  . Smokeless tobacco: Never Used  . Tobacco comment: quit 1964  Vaping Use  . Vaping Use: Never used  Substance Use Topics  . Alcohol use: No    Alcohol/week: 0.0 standard drinks  . Drug use: No    Review of Systems  Constitutional: No fever/chills Eyes: No visual changes.  ENT: No sore throat. Cardiovascular: Denies chest pain. Respiratory: Denies shortness of breath. Gastrointestinal: Positive nausea Genitourinary:  Negative for dysuria. Musculoskeletal: Negative for back pain. Skin: Negative for rash. Neurological: No headache, no neuro deficits, vertigo   ____________________________________________   PHYSICAL EXAM:  VITAL SIGNS: ED Triage Vitals  Enc Vitals Group     BP 02/04/20 1313 (!) 121/93     Pulse Rate 02/04/20 1313 (!) 53     Resp 02/04/20 1313 16     Temp 02/04/20 1313 97.8 F (36.6 C)     Temp Source 02/04/20 1313 Oral     SpO2 02/04/20 1313 99 %     Weight 02/04/20 1312 83.9 kg (185 lb)     Height 02/04/20 1312 1.702 m (5\' 7" )     Head Circumference --      Peak Flow --      Pain Score 02/04/20 1312 0     Pain Loc --      Pain Edu? --      Excl. in [place]? --     Constitutional: Alert and oriented. No acute distress Eyes: Conjunctivae are normal.  PERRLA horizontal nystagmus noted to the left Head: Atraumatic. Nose: No congestion/rhinnorhea. Mouth/Throat: Mucous membranes are moist.   Neck:  Painless ROM Cardiovascular: Normal rate, regular rhythm. Kermit Balo peripheral circulation. Respiratory: Normal respiratory effort.  No retractions.  Gastrointestinal: Soft and nontender. No distention.  No CVA tenderness. Musculoskeletal:  Warm and well perfused Neurologic:  Normal speech and language. No gross focal neurologic deficits are appreciated.  Skin:  Skin is warm, dry and intact. No rash noted. Psychiatric: Mood and affect are normal. Speech and behavior are normal.  ____________________________________________   LABS (all labs ordered are listed, but only abnormal results are displayed)  Labs Reviewed  COMPREHENSIVE METABOLIC PANEL - Abnormal; Notable for the following components:      Result Value   CO2 20 (*)    Glucose, Bld 133 (*)    Creatinine, Ser 1.81 (*)    GFR calc non Af Amer 25 (*)    GFR calc Af Amer 29 (*)    All other components within normal limits  CBC - Abnormal; Notable for the following components:   MCV 79.0 (*)    All other components  within normal limits  LIPASE, BLOOD  URINALYSIS, COMPLETE (UACMP) WITH MICROSCOPIC  TROPONIN I (HIGH SENSITIVITY)   ____________________________________________  EKG  ED ECG REPORT I, Lavonia Drafts, the attending physician, personally viewed and interpreted this ECG.  Date: 02/04/2020  Rhythm: normal sinus rhythm QRS Axis: normal Intervals: normal ST/T Wave abnormalities: normal Narrative Interpretation: no evidence of acute ischemia  ____________________________________________  RADIOLOGY  None ____________________________________________   PROCEDURES  Procedure(s) performed: No  .1-3 Lead EKG Interpretation Performed by: Lavonia Drafts, MD Authorized by: Lavonia Drafts, MD     Interpretation: normal     ECG rate assessment: normal     Rhythm: sinus rhythm     Ectopy: none     Conduction: normal       Critical Care performed: No ____________________________________________   INITIAL IMPRESSION / ASSESSMENT AND PLAN / ED COURSE  Pertinent labs & imaging results that were available during my care of the patient were reviewed by me and considered in my medical decision making (see chart for details).  Patient presents with complaints of dizziness most consistent with vertigo, likely BPV given her history.  No neuro deficits, no headache, consistent with past episodes.  Horizontal nystagmus noted as above.  Otherwise reassuring exam.  Will treat with IV Zofran, IV fluids, meclizine, she apparently has a benzo allergy.  No palpitations or chest pain, EKG is reassuring.  Have asked my colleague to reevaluate, anticipate discharge if improved symptoms.      ____________________________________________   FINAL CLINICAL IMPRESSION(S) / ED DIAGNOSES  Final diagnoses:  Vertigo        Note:  This document was prepared using Dragon voice recognition software and may include unintentional dictation errors.   Lavonia Drafts, MD 02/04/20 [id number]

## 2020-02-04 NOTE — ED Notes (Addendum)
This RN ambulated pt. Pt with steady gait. Pt stating she still doesn't feel good. When pt was using restroom she stated her head and chest started hurting. This RN asked pt if she was straining and she stated yes and that she has been constipated. MD made aware.

## 2020-02-04 NOTE — ED Notes (Signed)
Bed control changed unit, this RN not aware, tech took pt to room assigned on board that was not where report was initially called. Report then given to floor where pt was finially assigned.

## 2020-02-05 DIAGNOSIS — E039 Hypothyroidism, unspecified: Secondary | ICD-10-CM | POA: Diagnosis not present

## 2020-02-05 DIAGNOSIS — H8112 Benign paroxysmal vertigo, left ear: Secondary | ICD-10-CM

## 2020-02-05 LAB — URINALYSIS, COMPLETE (UACMP) WITH MICROSCOPIC
Bacteria, UA: NONE SEEN
Bilirubin Urine: NEGATIVE
Glucose, UA: NEGATIVE mg/dL
Hgb urine dipstick: NEGATIVE
Ketones, ur: NEGATIVE mg/dL
Nitrite: NEGATIVE
Protein, ur: NEGATIVE mg/dL
Specific Gravity, Urine: 1.004 — ABNORMAL LOW (ref 1.005–1.030)
Squamous Epithelial / HPF: NONE SEEN (ref 0–5)
pH: 7 (ref 5.0–8.0)

## 2020-02-05 LAB — CBC
HCT: 34.7 % — ABNORMAL LOW (ref 36.0–46.0)
Hemoglobin: 12.1 g/dL (ref 12.0–15.0)
MCH: 27.3 pg (ref 26.0–34.0)
MCHC: 34.9 g/dL (ref 30.0–36.0)
MCV: 78.3 fL — ABNORMAL LOW (ref 80.0–100.0)
Platelets: 162 10*3/uL (ref 150–400)
RBC: 4.43 MIL/uL (ref 3.87–5.11)
RDW: 14.4 % (ref 11.5–15.5)
WBC: 8 10*3/uL (ref 4.0–10.5)
nRBC: 0 % (ref 0.0–0.2)

## 2020-02-05 LAB — BASIC METABOLIC PANEL
Anion gap: 9 (ref 5–15)
BUN: 19 mg/dL (ref 8–23)
CO2: 23 mmol/L (ref 22–32)
Calcium: 8.9 mg/dL (ref 8.9–10.3)
Chloride: 111 mmol/L (ref 98–111)
Creatinine, Ser: 1.76 mg/dL — ABNORMAL HIGH (ref 0.44–1.00)
GFR calc Af Amer: 30 mL/min — ABNORMAL LOW (ref >60)
GFR calc non Af Amer: 26 mL/min — ABNORMAL LOW (ref >60)
Glucose, Bld: 99 mg/dL (ref 70–99)
Potassium: 4.2 mmol/L (ref 3.5–5.1)
Sodium: 143 mmol/L (ref 135–145)

## 2020-02-05 LAB — TROPONIN I (HIGH SENSITIVITY)
Troponin I (High Sensitivity): 23 ng/L — ABNORMAL HIGH (ref <18)
Troponin I (High Sensitivity): 21 ng/L — ABNORMAL HIGH (ref <18)

## 2020-02-05 NOTE — Progress Notes (Signed)
Stephanie Anthony A and O x4. VSS. Pt tolerating diet well. No complaints of nausea or vomiting. IV removed intact, prescriptions given. Pt voices understanding of discharge instructions with no further questions. Patient discharged via wheelchair with RN  Allergies as of 02/05/2020      Reactions   Aspirin Other (See Comments)   Rectal bleeding   Codeine Nausea And Vomiting   Prednisone Other (See Comments)   Hallucinations   Valium [diazepam] Other (See Comments)   Strange feelings      Medication List    TAKE these medications   ARIPiprazole 2 MG tablet Commonly known as: ABILIFY Take 2 mg by mouth at bedtime.   lamoTRIgine 100 MG tablet Commonly known as: LAMICTAL Take 100 mg by mouth at bedtime.   latanoprost 0.005 % ophthalmic solution Commonly known as: XALATAN Place 1 drop into both eyes at bedtime.   levothyroxine 88 MCG tablet Commonly known as: SYNTHROID Take 88 mcg by mouth daily before breakfast.   losartan 25 MG tablet Commonly known as: COZAAR Take 25 mg by mouth daily.   lovastatin 40 MG tablet Commonly known as: MEVACOR Take 40 mg by mouth at bedtime.   pantoprazole 40 MG tablet Commonly known as: PROTONIX Take 40 mg by mouth daily.       Vitals:   02/04/20 2239 02/05/20 0526  BP: (!) 140/55 (!) 117/51  Pulse: (!) 55 64  Resp: 17 16  Temp: 97.9 F (36.6 C) 98.7 F (37.1 C)  SpO2: 100% 97%    Darnelle Catalan

## 2020-02-05 NOTE — Discharge Instructions (Signed)
Benign Positional Vertigo Vertigo is the feeling that you or your surroundings are moving when they are not. Benign positional vertigo is the most common form of vertigo. This is usually a harmless condition (benign). This condition is positional. This means that symptoms are triggered by certain movements and positions. This condition can be dangerous if it occurs while you are doing something that could cause harm to you or others. This includes activities such as driving or operating machinery. What are the causes? In many cases, the cause of this condition is not known. It may be caused by a disturbance in an area of the inner ear that helps your brain to sense movement and balance. This disturbance can be caused by:  Viral infection (labyrinthitis).  Head injury.  Repetitive motion, such as jumping, dancing, or running. What increases the risk? You are more likely to develop this condition if:  You are a woman.  You are 50 years of age or older. What are the signs or symptoms? Symptoms of this condition usually happen when you move your head or your eyes in different directions. Symptoms may start suddenly, and usually last for less than a minute. They include:  Loss of balance and falling.  Feeling like you are spinning or moving.  Feeling like your surroundings are spinning or moving.  Nausea and vomiting.  Blurred vision.  Dizziness.  Involuntary eye movement (nystagmus). Symptoms can be mild and cause only minor problems, or they can be severe and interfere with daily life. Episodes of benign positional vertigo may return (recur) over time. Symptoms may improve over time. How is this diagnosed? This condition may be diagnosed based on:  Your medical history.  Physical exam of the head, neck, and ears.  Tests, such as: ? MRI. ? CT scan. ? Eye movement tests. Your health care provider may ask you to change positions quickly while he or she watches you for symptoms  of benign positional vertigo, such as nystagmus. Eye movement may be tested with a variety of exams that are designed to evaluate or stimulate vertigo. ? An electroencephalogram (EEG). This records electrical activity in your brain. ? Hearing tests. You may be referred to a health care provider who specializes in ear, nose, and throat (ENT) problems (otolaryngologist) or a provider who specializes in disorders of the nervous system (neurologist). How is this treated?  This condition may be treated in a session in which your health care provider moves your head in specific positions to adjust your inner ear back to normal. Treatment for this condition may take several sessions. Surgery may be needed in severe cases, but this is rare. In some cases, benign positional vertigo may resolve on its own in 2-4 weeks. Follow these instructions at home: Safety  Move slowly. Avoid sudden body or head movements or certain positions, as told by your health care provider.  Avoid driving until your health care provider says it is safe for you to do so.  Avoid operating heavy machinery until your health care provider says it is safe for you to do so.  Avoid doing any tasks that would be dangerous to you or others if vertigo occurs.  If you have trouble walking or keeping your balance, try using a cane for stability. If you feel dizzy or unstable, sit down right away.  Return to your normal activities as told by your health care provider. Ask your health care provider what activities are safe for you. General instructions  Take over-the-counter   and prescription medicines only as told by your health care provider.  Drink enough fluid to keep your urine pale yellow.  Keep all follow-up visits as told by your health care provider. This is important. Contact a health care provider if:  You have a fever.  Your condition gets worse or you develop new symptoms.  Your family or friends notice any  behavioral changes.  You have nausea or vomiting that gets worse.  You have numbness or a "pins and needles" sensation. Get help right away if you:  Have difficulty speaking or moving.  Are always dizzy.  Faint.  Develop severe headaches.  Have weakness in your legs or arms.  Have changes in your hearing or vision.  Develop a stiff neck.  Develop sensitivity to light. Summary  Vertigo is the feeling that you or your surroundings are moving when they are not. Benign positional vertigo is the most common form of vertigo.  The cause of this condition is not known. It may be caused by a disturbance in an area of the inner ear that helps your brain to sense movement and balance.  Symptoms include loss of balance and falling, feeling that you or your surroundings are moving, nausea and vomiting, and blurred vision.  This condition can be diagnosed based on symptoms, physical exam, and other tests, such as MRI, CT scan, eye movement tests, and hearing tests.  Follow safety instructions as told by your health care provider. You will also be told when to contact your health care provider in case of problems. This information is not intended to replace advice given to you by your health care provider. Make sure you discuss any questions you have with your health care provider. Document Revised: 10/14/2017 Document Reviewed: 10/14/2017 Elsevier Patient Education  2020 Elsevier Inc.  

## 2020-02-05 NOTE — Evaluation (Signed)
Physical Therapy Evaluation Patient Details Name: Stephanie Anthony MRN: [id number] DOB: August 31, 1933 Today's Date: 02/05/2020   History of Present Illness  Pt is an 86 y.o. 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 here with weakness, near syncope x 3.      Clinical Impression  Pt received in Semi-Fowler's position and agreeable to therapy.  Pt reports that her dizziness has subsided for the most part and only experiences when she rolls to her L side.  When rolling to the L side today, pt did not have any saccadic eye movements, and no dizziness reported.  Pt does not display nystagmus when performing cranial nerve testing in sitting, however is unable to perform VOR without blinking.  Further evaluation necessary to rule in/out BPPV symptoms.  Pt self-reports mild dizziness when standing from seated position that resolves in 20-30 sec.  Pt was able to ambulate around nursing station without AD and supervision.  Pt then transferred back to bed with nursing in room for blood testing.  Pt will benefit from PT services in a OPPT setting upon discharge to safely address deficits listed in patient problem list for decreased caregiver assistance and eventual return to PLOF.          Follow Up Recommendations Outpatient PT;Other (comment) (Recommend OPPT for vestibular symptoms.)    Equipment Recommendations  None recommended by PT    Recommendations for Other Services       Precautions / Restrictions  Precautions Precautions: None Restrictions Weight Bearing Restrictions: No      Mobility  Bed Mobility Overal bed mobility: Modified Independent                Transfers Overall transfer level: Modified independent               General transfer comment: pt able to transfer to sitting and standing without AD, supervision and extra time as necessary.  Ambulation/Gait Ambulation/Gait assistance: Supervision Gait Distance (Feet): 180 Feet   Gait Pattern/deviations: Step-through pattern Gait velocity: decreased   General Gait Details: Pt able to ambualte well, however has minimal dizziness upon standing from seated position.  Stairs            Wheelchair Mobility    Modified Rankin (Stroke Patients Only)       Balance Overall balance assessment: Needs assistance Sitting-balance support: Feet supported Sitting balance-Leahy Scale: Good       Standing balance-Leahy Scale: Good Standing balance comment: Pt experiences mild dizziness upon standing but resolves after static stance for 20-30 sec.                             Pertinent Vitals/Pain Pain Assessment: No/denies pain    Home Living Family/patient expects to be discharged to:: Private residence Living Arrangements: Alone Available Help at Discharge: Family Type of Home: House Home Access: Stairs to enter Entrance Stairs-Rails: Right;Left;Can reach both Entrance Stairs-Number of Steps: 4 Home Layout: One level Home Equipment: Bedside commode;Shower seat      Prior Function Level of Independence: Independent         Comments: ambulates without AD  Hand Dominance   Dominant Hand: Right    Extremity/Trunk Assessment   Upper Extremity Assessment Upper Extremity Assessment: Overall WFL for tasks assessed    Lower Extremity Assessment Lower Extremity Assessment: Overall WFL for tasks assessed       Communication   Communication: No difficulties  Cognition  Arousal/Alertness: Awake/alert Behavior During Therapy: WFL for tasks assessed/performed Overall Cognitive Status: Within Functional Limits for tasks assessed                                        General Comments      Exercises Total Joint Exercises Ankle Circles/Pumps: AROM;Strengthening;Both;10 reps;Supine Quad Sets: AROM;Strengthening;Both;10 reps;Supine Gluteal Sets: AROM;Strengthening;Both;10 reps;Supine Hip ABduction/ADduction: AROM;Strengthening;Both;10 reps;Supine Straight Leg Raises: AROM;Strengthening;Both;10 reps;Supine Marching in Standing: AROM;Strengthening;Both;10 reps;Standing Other Exercises Other Exercises: Pt educated on roles of PT and services provided during hospital stay. Other Exercises: Pt educated on vestibular symptom response and benefits of vestibular physical therapy.  Pt encouraged to undergo vestibular rehabilitation at outpatient PT clinic after d/c.   Assessment/Plan    PT Assessment Patient needs continued PT services  PT Problem List Decreased balance;Decreased mobility       PT Treatment Interventions Gait training;Stair training;Functional mobility training;Therapeutic exercise    PT Goals (Current goals can be found in the Care Plan section)  Acute Rehab PT Goals Patient Stated Goal: no more dizziness PT Goal Formulation: With patient Time For Goal Achievement: 02/19/20 Potential to Achieve Goals: Good    Frequency Min 2X/week   Barriers to discharge        Co-evaluation               AM-PAC PT "6 Clicks" Mobility  Outcome Measure Help needed turning from your back to your side while in a flat bed without using bedrails?: A Little Help needed moving from lying on your back to sitting on the side of a flat bed without using bedrails?: A Little Help needed moving to and from a bed to a chair (including a wheelchair)?: A Little Help needed standing up from a chair using your arms (e.g., wheelchair or bedside  chair)?: A Little Help needed to walk in hospital room?: A Little Help needed climbing 3-5 steps with a railing? : A Little 6 Click Score: 18    End of Session Equipment Utilized During Treatment: Gait belt Activity Tolerance: Patient tolerated treatment well Patient left: in bed;with call bell/phone within reach;with nursing/sitter in room Nurse Communication: Mobility status PT Visit Diagnosis: Unsteadiness on feet (R26.81);Repeated falls (R29.6);History of falling (Z91.81);Difficulty in walking, not elsewhere classified (R26.2);BPPV    Time: 1448-1856 PT Time Calculation (min) (ACUTE ONLY): 26 min   Charges:   PT Evaluation $PT Eval Low Complexity: 1 Low PT Treatments $Gait Training: 8-22 mins $Therapeutic Exercise: 8-22 mins        Gwenlyn Saran, PT, DPT 02/05/20, 10:35 AM

## 2020-02-05 NOTE — Discharge Summary (Signed)
Physician Discharge Summary  Rosio Weiss WER:[id number] DOB: [birth year] DOA: 02/04/2020  PCP: [name], MD  Admit date: 02/04/2020 Discharge date: 02/05/2020  Admitted From: Home Disposition: Home  Recommendations for Outpatient Follow-up:  1. Follow up with PCP in 1-2 weeks 2. Please obtain BMP/CBC in one week 3. Please follow up with your PCP on the following pending results: Unresulted Labs (From admission, onward)         None       Home Health: None Equipment/Devices: None  Discharge Condition: Stable CODE STATUS: Full code Diet recommendation: Cardiac  Subjective: Seen and examined.  Feels much better.  No more dizziness.  Stephanie Anthony is a 86 y.o. female with medical history significant of bipolar disorder, mitral valve disease, CAD, hypertension, bradycardia, GERD and multiple comorbidities as listed below presented to ED with complaint of dizziness.  Patient lives alone.  According to patient, at around 9 AM this morning, while she was laying in her recliner, she initially felt very hot followed by sweating and then she suddenly felt dizzy.  She tried to get out of the recliner multiple times but she continued to feel dizzy with any sort of movement and then she had couple of episodes of vomiting.  She was finally able to get to her phone and called her son and he brought her to the emergency department.  Patient denies having had any chest pain, shortness of breath, abdominal pain, any problem with urination or with bowel movements.   ED Course: Upon arrival to ED, patient was bradycardic with near normal blood pressure.  CT head was done which was unremarkable.  CBC within normal range.  BMP at her baseline.  EKG showed sinus bradycardia.  Despite of getting Antivert in the ED, she was still dizzy and having imbalance.  She lives alone so hospital service were consulted to admit the patient for observation.  COVID-19 test is pending.   Brief/Interim  Summary: Patient was admitted with major diagnosis of vertigo.  It was thought to be either vestibular neuritis or BPPV.  She also had mild bradycardia but she carries a history of bradycardia in the past.  Cardiac enzymes were checked which were slightly elevated but flat.  Patient did not have any chest pain or shortness of breath.  Patient was seen by PT for vestibular therapies and per their evaluation, patient likely has left-sided BPPV and although they cleared the patient for discharge but recommended outpatient PT referral for further vestibular therapies which has been ordered for patient.  Patient wants to go home as she is feeling better and I also discussed discharge plan with patient's son [name] over the phone and he also agrees with the plan and was appreciative of the care provided to her mother.  Discharge Diagnoses:  Active Problems:   Acquired hypothyroidism   Bipolar affective disorder ([place])   Bradycardia   Essential (primary) hypertension   HLD (hyperlipidemia)   Mitral and aortic incompetence   Near syncope   BPPV (benign paroxysmal positional vertigo), left    Discharge Instructions  Discharge Instructions    Ambulatory referral to Physical Therapy   Complete by: As directed    Please work on vestibular therapies for BPPV.   Discharge patient   Complete by: As directed    Discharge disposition: 01-Home or Self Care   Discharge patient date: 02/05/2020     Allergies as of 02/05/2020      Reactions   Aspirin Other (See Comments)  Rectal bleeding   Codeine Nausea And Vomiting   Prednisone Other (See Comments)   Hallucinations   Valium [diazepam] Other (See Comments)   Strange feelings      Medication List    TAKE these medications   ARIPiprazole 2 MG tablet Commonly known as: ABILIFY Take 2 mg by mouth at bedtime.   lamoTRIgine 100 MG tablet Commonly known as: LAMICTAL Take 100 mg by mouth at bedtime.   latanoprost 0.005 % ophthalmic  solution Commonly known as: XALATAN Place 1 drop into both eyes at bedtime.   levothyroxine 88 MCG tablet Commonly known as: SYNTHROID Take 88 mcg by mouth daily before breakfast.   losartan 25 MG tablet Commonly known as: COZAAR Take 25 mg by mouth daily.   lovastatin 40 MG tablet Commonly known as: MEVACOR Take 40 mg by mouth at bedtime.   pantoprazole 40 MG tablet Commonly known as: PROTONIX Take 40 mg by mouth daily.       Follow-up Information    [name], MD Follow up in 1 week(s).   Specialty: Family Medicine Contact information: [place] [address] [phone]              Allergies  Allergen Reactions  . Aspirin Other (See Comments)    Rectal bleeding  . Codeine Nausea And Vomiting  . Prednisone Other (See Comments)    Hallucinations  . Valium [Diazepam] Other (See Comments)    Strange feelings    Consultations: None   Procedures/Studies: CT Head Wo Contrast  Result Date: 02/04/2020 CLINICAL DATA:  Sudden onset dizziness and vomiting with diaphoresis today. EXAM: CT HEAD WITHOUT CONTRAST TECHNIQUE: Contiguous axial images were obtained from the base of the skull through the vertex without intravenous contrast. COMPARISON:  09/11/2014 FINDINGS: Brain: Ventricles, cisterns and other CSF spaces are normal. There is no mass, mass effect, shift of midline structures or acute hemorrhage. No evidence of acute infarction. There is chronic ischemic microvascular disease. Vascular: No hyperdense vessel or unexpected calcification. Skull: Normal. Negative for fracture or focal lesion. Sinuses/Orbits: No acute finding. Other: None. IMPRESSION: 1. No acute findings. 2. Chronic ischemic microvascular disease. Electronically Signed   By: Marin Olp M.D.   On: 02/04/2020 16:06      Discharge Exam: Vitals:   02/04/20 2239 02/05/20 0526  BP: (!) 140/55 (!) 117/51  Pulse: (!) 55 64  Resp: 17 16  Temp:  97.9 F (36.6 C) 98.7 F (37.1 C)  SpO2: 100% 97%   Vitals:   02/04/20 2100 02/04/20 2239 02/05/20 0500 02/05/20 0526  BP: (!) 112/58 (!) 140/55  (!) 117/51  Pulse: (!) 52 (!) 55  64  Resp: 17 17  16   Temp:  97.9 F (36.6 C)  98.7 F (37.1 C)  TempSrc:  Oral  Oral  SpO2: 98% 100%  97%  Weight:   84 kg   Height:        General: Pt is alert, awake, not in acute distress Cardiovascular: RRR, S1/S2 +, no rubs, no gallops Respiratory: CTA bilaterally, no wheezing, no rhonchi Abdominal: Soft, NT, ND, bowel sounds + Extremities: no edema, no cyanosis    The results of significant diagnostics from this hospitalization (including imaging, microbiology, ancillary and laboratory) are listed below for reference.     Microbiology: Recent Results (from the past 240 hour(s))  SARS Coronavirus 2 by RT PCR (hospital order, performed in [facility] hospital lab) Nasopharyngeal Nasopharyngeal Swab     Status:  None   Collection Time: 02/04/20  7:01 PM   Specimen: Nasopharyngeal Swab  Result Value Ref Range Status   SARS Coronavirus 2 NEGATIVE NEGATIVE Final    Comment: (NOTE) SARS-CoV-2 target nucleic acids are NOT DETECTED.  The SARS-CoV-2 RNA is generally detectable in upper and lower respiratory specimens during the acute phase of infection. The lowest concentration of SARS-CoV-2 viral copies this assay can detect is 250 copies / mL. A negative result does not preclude SARS-CoV-2 infection and should not be used as the sole basis for treatment or other patient management decisions.  A negative result may occur with improper specimen collection / handling, submission of specimen other than nasopharyngeal swab, presence of viral mutation(s) within the areas targeted by this assay, and inadequate number of viral copies (<250 copies / mL). A negative result must be combined with clinical observations, patient history, and epidemiological information.  Fact Sheet for Patients:    StrictlyIdeas.no  Fact Sheet for Healthcare Providers: BankingDealers.co.za  This test is not yet approved or  cleared by the [place] FDA and has been authorized for detection and/or diagnosis of SARS-CoV-2 by FDA under an Emergency Use Authorization (EUA).  This EUA will remain in effect (meaning this test can be used) for the duration of the COVID-19 declaration under Section 564(b)(1) of the Act, 21 U.S.C. section 360bbb-3(b)(1), unless the authorization is terminated or revoked sooner.  Performed at [facility], [address] [place], [place] [address]      Labs: BNP (last 3 results) No results for input(s): BNP in the last 8760 hours. Basic Metabolic Panel: Recent Labs  Lab 02/04/20 1322 02/04/20 1901 02/05/20 0715  NA 140  --  143  K 3.7  --  4.2  CL 105  --  111  CO2 20*  --  23  GLUCOSE 133*  --  99  BUN 19  --  19  CREATININE 1.81* 1.70* 1.76*  CALCIUM 9.5  --  8.9  MG  --  2.0  --    Liver Function Tests: Recent Labs  Lab 02/04/20 1322  AST 28  ALT 17  ALKPHOS 38  BILITOT 0.9  PROT 6.8  ALBUMIN 3.8   Recent Labs  Lab 02/04/20 1322  LIPASE 48   No results for input(s): AMMONIA in the last 168 hours. CBC: Recent Labs  Lab 02/04/20 1322 02/05/20 0715  WBC 7.9 8.0  HGB 12.6 12.1  HCT 37.2 34.7*  MCV 79.0* 78.3*  PLT 173 162   Cardiac Enzymes: No results for input(s): CKTOTAL, CKMB, CKMBINDEX, TROPONINI in the last 168 hours. BNP: Invalid input(s): POCBNP CBG: No results for input(s): GLUCAP in the last 168 hours. D-Dimer No results for input(s): DDIMER in the last 72 hours. Hgb A1c No results for input(s): HGBA1C in the last 72 hours. Lipid Profile No results for input(s): CHOL, HDL, LDLCALC, TRIG, CHOLHDL, LDLDIRECT in the last 72 hours. Thyroid function studies Recent Labs    02/04/20 1901  TSH 2.770   Anemia work up No results for input(s): VITAMINB12,  FOLATE, FERRITIN, TIBC, IRON, RETICCTPCT in the last 72 hours. Urinalysis    Component Value Date/Time   COLORURINE STRAW (A) 02/04/2020 0130   APPEARANCEUR CLEAR (A) 02/04/2020 0130   APPEARANCEUR Clear 09/11/2014 1457   LABSPEC 1.004 (L) 02/04/2020 0130   LABSPEC 1.004 09/11/2014 1457   PHURINE 7.0 02/04/2020 0130   GLUCOSEU NEGATIVE 02/04/2020 0130   GLUCOSEU Negative 09/11/2014 1457   HGBUR NEGATIVE 02/04/2020 0130  BILIRUBINUR NEGATIVE 02/04/2020 0130   BILIRUBINUR Negative 09/11/2014 1457   KETONESUR NEGATIVE 02/04/2020 0130   PROTEINUR NEGATIVE 02/04/2020 0130   NITRITE NEGATIVE 02/04/2020 0130   LEUKOCYTESUR TRACE (A) 02/04/2020 0130   LEUKOCYTESUR Negative 09/11/2014 1457   Sepsis Labs Invalid input(s): PROCALCITONIN,  WBC,  LACTICIDVEN Microbiology Recent Results (from the past 240 hour(s))  SARS Coronavirus 2 by RT PCR (hospital order, performed in [place] hospital lab) Nasopharyngeal Nasopharyngeal Swab     Status: None   Collection Time: 02/04/20  7:01 PM   Specimen: Nasopharyngeal Swab  Result Value Ref Range Status   SARS Coronavirus 2 NEGATIVE NEGATIVE Final    Comment: (NOTE) SARS-CoV-2 target nucleic acids are NOT DETECTED.  The SARS-CoV-2 RNA is generally detectable in upper and lower respiratory specimens during the acute phase of infection. The lowest concentration of SARS-CoV-2 viral copies this assay can detect is 250 copies / mL. A negative result does not preclude SARS-CoV-2 infection and should not be used as the sole basis for treatment or other patient management decisions.  A negative result may occur with improper specimen collection / handling, submission of specimen other than nasopharyngeal swab, presence of viral mutation(s) within the areas targeted by this assay, and inadequate number of viral copies (<250 copies / mL). A negative result must be combined with clinical observations, patient history, and epidemiological  information.  Fact Sheet for Patients:   StrictlyIdeas.no  Fact Sheet for Healthcare Providers: BankingDealers.co.za  This test is not yet approved or  cleared by the [place] FDA and has been authorized for detection and/or diagnosis of SARS-CoV-2 by FDA under an Emergency Use Authorization (EUA).  This EUA will remain in effect (meaning this test can be used) for the duration of the COVID-19 declaration under Section 564(b)(1) of the Act, 21 U.S.C. section 360bbb-3(b)(1), unless the authorization is terminated or revoked sooner.  Performed at [facility], [address], [place], [place] [address]      Time coordinating discharge: Over 30 minutes  SIGNED:   Darliss Cheney, MD  [facility] 02/05/2020, 10:36 AM  If 7PM-7AM, please contact night-coverage [email or web address]

## 2020-02-27 ENCOUNTER — Other Ambulatory Visit: Payer: Self-pay

## 2020-02-27 ENCOUNTER — Ambulatory Visit: Payer: Medicare Other | Attending: Family Medicine

## 2020-02-27 DIAGNOSIS — R42 Dizziness and giddiness: Secondary | ICD-10-CM | POA: Diagnosis present

## 2020-02-27 NOTE — Therapy (Signed)
[place] MA[facility] SERVICES [address] [place], [place], [address] Phone: [phone]   Fax:  [phone]  Physical Therapy Evaluation  Patient Details  Name: Stephanie Anthony MRN: [id number] Date of Birth: 05/25/1933 Referring Provider (PT): Dr. Netty Starring   Encounter Date: 02/27/2020   PT End of Session - 02/28/20 1329    Visit Number 1    Number of Visits 17    Date for PT Re-Evaluation 04/23/20    Authorization Type eval: 02/27/20    PT Start Time 1100    PT Stop Time 1200    PT Time Calculation (min) 60 min    Equipment Utilized During Treatment Gait belt    Activity Tolerance Patient tolerated treatment well    Behavior During Therapy [facility] for tasks assessed/performed           Past Medical History:  Diagnosis Date  . Arthritis   . Bipolar 1 disorder ([place])   . Cataracts, bilateral   . Chronic kidney disease    stage 3  . Chronic kidney disease   . Chronic mitral valve disease   . Coronary artery disease   . Deviated nasal septum   . Erosive esophagitis   . Erosive esophagitis   . Esophageal stricture   . Fibroid tumor   . GERD (gastroesophageal reflux disease)   . History of hiatal hernia   . Hypertension   . Hypothyroidism   . Murmur, cardiac   . 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 Pure hypercholesterolemia   . Renal cyst, left    bilateral  . Seasonal allergies     Past Surgical History:  Procedure Laterality Date  . CARDIAC CATHETERIZATION    . CHOLECYSTECTOMY    . COLONOSCOPY WITH PROPOFOL N/A 06/01/2018   Procedure: COLONOSCOPY WITH PROPOFOL;  Surgeon: [name], MD;  Location: [facility] ENDOSCOPY;  Service: Endoscopy;  Laterality: N/A;  . ESOPHAGEAL DILATION    . ESOPHAGOGASTRODUODENOSCOPY (EGD) WITH PROPOFOL N/A 10/20/2014   Procedure: ESOPHAGOGASTRODUODENOSCOPY (EGD) WITH PROPOFOL;  Surgeon: [name], MD;  Location: [facility] ENDOSCOPY;  Service: Endoscopy;  Laterality: N/A;  .  ESOPHAGOGASTRODUODENOSCOPY (EGD) WITH PROPOFOL N/A 06/01/2018   Procedure: ESOPHAGOGASTRODUODENOSCOPY (EGD) WITH PROPOFOL;  Surgeon: [name], MD;  Location: [facility] ENDOSCOPY;  Service: Endoscopy;  Laterality: N/A;  . INFECTED SKIN DEBRIDEMENT     on back several years ago  . JOINT REPLACEMENT    . knee replacement, right    . LAPAROSCOPIC APPENDECTOMY N/A 06/21/2018   Procedure: APPENDECTOMY LAPAROSCOPIC;  Surgeon: Herbert Pun, MD;  Location: [facility] ORS;  Service: General;  Laterality: N/A;  . NOSE SURGERY    . THYROIDECTOMY    . TOTAL THYROIDECTOMY      There were no vitals filed for this visit.    Subjective Assessment - 02/27/20 1049    Subjective Dizziness    Pertinent History History borrowed from medical record and confirmed/edited with patient. Stephanie Anthony is a 86 y.o. female with medical history significant of bipolar disorder, mitral valve disease, CAD, hypertension, bradycardia, GERD and multiple comorbidities who presented to [facility] ED on 02/04/20 with complaint of dizziness.  Patient lives alone. According to patient, at around 9 AM that morning, while she was laying in her couch, she initially felt very hot followed by sweating and then she suddenly felt dizzy. She reports that her legs were shaking and she felt very "clammy."  She tried to get off her couch multiple times but she  continued to feel dizzy with any sort of movement and then she had couple of episodes of vomiting.  The phone was on the chair next to the couch and she called her son who called EMS and she was transported to the ED. Patient denied having had any chest pain, shortness of breath, abdominal pain, any problem with urination or with bowel movements. Upon arrival to ED, patient was bradycardic with near normal blood pressure. CT head was done which was unremarkable. CBC within normal range.  BMP at her baseline. EKG showed sinus bradycardia.  Despite of getting Antivert in the ED, she was still  dizzy and having imbalance. She lives alone so hospital service were consulted to admit the patient for observation. Patient was admitted with major diagnosis of vertigo.  It was thought to be either vestibular neuritis or BPPV.  She also had mild bradycardia but she carries a history of bradycardia in the past.  Cardiac enzymes were checked which were slightly elevated but flat.  Patient did not have any chest pain or shortness of breath.  Patient was seen by PT for vestibular therapies and per their evaluation, patient likely has left-sided BPPV and although they cleared the patient for discharge but recommended outpatient PT referral for further vestibular therapies which has been ordered for patient. Pt had follow-up with her PCP on 02/09/20. She was still reporting symptoms at that time but they were not inhibiting function. She continues to report feeling "unsure" of herself with concerns for dizziness during walking. Pt reports that she has had "this dizziness problem for years." She states that the first time it happened was in the 1970's. She states that she gets these episodes once every five years or so. She reports that the severe symptoms have resolved and now she is only having unsteadiness.    Patient Stated Goals Improve dizziness/balance    Currently in Pain? No/denies   Unrelated to current episode             [facility] PT Assessment - 02/28/20 1509      Assessment   Medical Diagnosis Dizziness    Referring Provider (PT) Dr. Netty Starring    Onset Date/Surgical Date 02/04/20    Hand Dominance Right    Next MD Visit Not reported    Prior Therapy None reported      Precautions   Precautions Fall      Restrictions   Weight Bearing Restrictions No      Balance Screen   Has the patient fallen in the past 6 months No    Has the patient had a decrease in activity level because of a fear of falling?  No    Is the patient reluctant to leave their home because of a fear of falling?  No        Home Ecologist residence    Living Arrangements Alone      Prior Function   Level of Independence Independent      Cognition   Overall Cognitive Status No family/caregiver present to determine baseline cognitive functioning             VESTIBULAR AND BALANCE EVALUATION   HISTORY:  Subjective history of current problem:  History borrowed from medical record and confirmed/edited with patient. Stephanie Anthony is a 86 y.o. female with medical history significant of bipolar disorder, mitral valve disease, CAD, hypertension, bradycardia, GERD and multiple comorbidities who presented to [facility] ED on 02/04/20 with complaint  of dizziness.  Patient lives alone. According to patient, at around 9 AM that morning, while she was laying in her couch, she initially felt very hot followed by sweating and then she suddenly felt dizzy. She reports that her legs were shaking and she felt very "clammy."  She tried to get off her couch multiple times but she continued to feel dizzy with any sort of movement and then she had couple of episodes of vomiting.  The phone was on the chair next to the couch and she called her son who called EMS and she was transported to the ED. Patient denied having had any chest pain, shortness of breath, abdominal pain, any problem with urination or with bowel movements. Upon arrival to ED, patient was bradycardic with near normal blood pressure. CT head was done which was unremarkable. CBC within normal range.  BMP at her baseline. EKG showed sinus bradycardia.  Despite of getting Antivert in the ED, she was still dizzy and having imbalance. She lives alone so hospital service were consulted to admit the patient for observation. Patient was admitted with major diagnosis of vertigo.  It was thought to be either vestibular neuritis or BPPV.  She also had mild bradycardia but she carries a history of bradycardia in the past.  Cardiac enzymes were checked  which were slightly elevated but flat.  Patient did not have any chest pain or shortness of breath.  Patient was seen by PT for vestibular therapies and per their evaluation, patient likely has left-sided BPPV and although they cleared the patient for discharge but recommended outpatient PT referral for further vestibular therapies which has been ordered for patient. Pt had follow-up with her PCP on 02/09/20. She was still reporting symptoms at that time but they were not inhibiting function. She continues to report feeling "unsure" of herself with concerns for dizziness during walking. Pt reports that she has had "this dizziness problem for years." She states that the first time it happened was in the 1970's. She states that she gets these episodes once every five years or so. She reports that the severe symptoms have resolved and now she is only having unsteadiness.  Description of dizziness: (vertigo, unsteadiness, lightheadedness, falling, general unsteadiness, whoozy, swimmy-headed sensation, aural fullness) Unsteadiness, initial symptoms have resolved Frequency: Constant Duration: Constant feeling of unsteadiness Symptom nature: (motion provoked, positional, spontaneous, constant, variable, intermittent) Pt unsure but sounds spontaneous in nature  Provocative Factors: "when I get really nervous or upset about something" Easing Factors: No known easing factors  Progression of symptoms: (better, worse, no change since onset) Severe symptoms have resolved  History of similar episodes: Pt reports similar episodes every 5 years or so  Falls (yes/no): No  Prior Functional Level: Independent with ADLs/IADLs. Son assists with transportations  Auditory complaints (tinnitus, pain, drainage, hearing loss, aural fullness): buzzing in ears at night, Pt reports significant hearing loss in left ear for several years. Reports she hasn't been to the ENT in almost 30 years. Reports sores in ears which is chronic.  Doesn't wear hearing aids. Vision (diplopia, visual field loss, recent changes, last eye exam): Double vision in the past but not currently. Has glaucoma. Denies any changes in vision during this episodes.  Headaches: Occasional headaches, no previous diagnosis of migraines. She reports that she gets headaches when she feels like she is getting a dizzy spell.   Red Flags: (dysarthria, dysphagia, drop attacks, bowel and bladder changes, recent weight loss/gain) Review of systems negative for red flags.  EXAMINATION  POSTURE: Forward head/rounded shoulders  NEUROLOGICAL SCREEN: (2+ unless otherwise noted.) N=normal  Ab=abnormal  Level Dermatome R L Myotome R L Reflex R L  C3 Anterior Neck N N Sidebend C2-3 N N Jaw CN V    C4 Top of Shoulder N N Shoulder Shrug C4 N N Hoffman's UMN    C5 Lateral Upper Arm N N Shoulder ABD C4-5 N N Biceps C5-6    C6 Lateral Arm/ Thumb N N Arm Flex/ Wrist Ext C5-6 N N Brachiorad. C5-6    C7 Middle Finger N N Arm Ext//Wrist Flex C6-7 N N Triceps C7    C8 4th & 5th Finger N N Flex/ Ext Carpi Ulnaris C8 N N Patellar (L3-4)    T1 Medial Arm N N Interossei T1 N N Gastrocnemius    L2 Medial thigh/groin N N Illiopsoas (L2-3) N N     L3 Lower thigh/med.knee N N Quadriceps (L3-4) N N     L4 Medial leg/lat thigh N N Tibialis Ant (L4-5) Ab Ab     L5 Lat. leg & dorsal foot N N EHL (L5) N N     S1 post/lat foot/thigh/leg N N Gastrocnemius (S1-2) N N     S2 Post./med. thigh & leg N N Hamstrings (L4-S3) N N       Cranial Nerves Visual acuity and visual fields are intact  Extraocular muscles are intact  Facial sensation is intact bilaterally  Facial strength is intact bilaterally  Hearing is normal as tested by gross conversation Normal phonation  Shoulder shrug strength is intact  Tongue protrudes midline    SOMATOSENSORY:         Sensation           Intact      Diminished         Absent  Light touch Normal      COORDINATION: Finger to Nose: Slow and  tremors slightly limit testing. Pt reports bilateral UE tremors and states that her MD told her that she is in the first stages of Parkinson's.  Heel to Shin: Normal Pronator Drift: Not tested Rapid Alternating Movements: Normal Finger to Thumb Opposition: Normal  MUSCULOSKELETAL SCREEN: Cervical Spine ROM: Moderate to severe loss of cervical AROM in all directions   ROM: UE/LE grossly WNL  MMT: Decreased ankle DF strength bilateral;  Functional Mobility: Independent for transfers and ambulation without assistive device   Gait: Scanning of visual environment with gait is: Decreased spontaneous head turns during ambulation. Short step length with decreased speed and mild/moderate shuffle steps   POSTURAL CONTROL TESTS:   Clinical Test of Sensory Interaction for Balance    (CTSIB): Deferred  OCULOMOTOR / VESTIBULAR TESTING:  Oculomotor Exam- Room Light  Findings Comments  Ocular Alignment normal   Ocular ROM abnormal Decreased vertical gaze noted  Spontaneous Nystagmus normal   Gaze-Holding Nystagmus normal   End-Gaze Nystagmus normal   Vergence (normal 2-3") not examined   Smooth Pursuit abnormal Saccadic  Cross-Cover Test not examined   Saccades abnormal Normal horizontally but slow and hypometric vertically as well as limited by decreased vertical gaze ROM  VOR Cancellation normal   Left Head Impulse abnormal Consistently requiring corrective saccades, difficult to assess due to pt struggling to relax neck muscles  Right Head Impulse normal Appears normal but difficult to assess due to pt struggling to relax neck muscles  Static Acuity not examined   Dynamic Acuity not examined     Oculomotor Exam- Fixation Suppressed  Findings Comments  Ocular Alignment normal   Spontaneous Nystagmus normal   Gaze-Holding Nystagmus abnormal 1st degree L horizontal beating nystagmus only observed with L gaze, does not appear to be end range nystagmus  End-Gaze Nystagmus abnormal See  above  Head Shaking Nystagmus normal   Pressure-Induced Nystagmus not examined   Hyperventilation Induced Nystagmus not examined   Skull Vibration Induced Nystagmus not examined     BPPV TESTS:  Symptoms Duration Intensity Nystagmus  L Dix-Hallpike None   None  R Dix-Hallpike None   None  L Head Roll None   None  R Head Roll None   None  L Sidelying Test      R Sidelying Test        FUNCTIONAL OUTCOME MEASURES   Results Comments  5TSTS 19.7 seconds Increased risk for falls, decreased LE strength  10 Meter Gait Speed Self-selected: 14.8s = 0.68 m/s; Below normative values for full community ambulation  ABC Scale 58.125% Below-cut-off  DHI 48/100 Moderate perception of handicap  FOTO 40 Predicted improvement to 60           Objective measurements completed on examination: See above findings.               PT Education - 02/28/20 1327    Education Details Plan of care    Person(s) Educated Patient    Methods Explanation    Comprehension Verbalized understanding            PT Short Term Goals - 02/28/20 1529      PT SHORT TERM GOAL #1   Title Pt will be independent with HEP in order to improve strength and balance in order to decrease fall risk and improve function at home and work.    Time 4    Period Weeks    Status New    Target Date 03/26/20             PT Long Term Goals - 02/28/20 1530      PT LONG TERM GOAL #1   Title Pt will improve ABC by at least 13% in order to demonstrate clinically significant improvement in balance confidence.    Baseline 02/27/20: 58.125%    Time 8    Period Weeks    Status New    Target Date 04/23/20      PT LONG TERM GOAL #2   Title Pt will decrease 5TSTS by at least 3 seconds in order to demonstrate clinically significant improvement in LE strength.    Baseline 02/27/20: 19.7s    Time 8    Period Weeks    Status New    Target Date 04/23/20      PT LONG TERM GOAL #3   Title Pt will decrease DHI  score by at least 18 points in order to demonstrate clinically significant reduction in disability    Baseline 02/27/20: 48/100    Time 8    Period Weeks    Status New    Target Date 04/23/20      PT LONG TERM GOAL #4   Title Pt will improve FOTO score to 60 in order to demonstrate clinically significant improvement in function.    Baseline 02/27/20: 40    Time 8    Period Weeks    Status New    Target Date 04/23/20                  Plan - 02/28/20 1526    Clinical Impression  Statement Pt is a pleasant 86 year-old female referred for dizziness. Pt with complex medical history and multiple comorbidities which could be contributing to her symptoms. She is not a great historian. She reports that her severe symptoms have mostly resolved at this time however she is left feelings somewhat unsteady and unsure of her balance. Her referral from the hospital indicated a concern for BPPV however history and examination today do not support this concern.  She reports similar episodes occurring every 5 years or so since the 1970s.  She also endorses tinnitus in the evenings as well as the complete loss of word recognition in her left ear.  Recommend referral to ENT for audiogram to assess hearing in light of her reports of L sided deficits.  Do not believe VNG study would offer much information in the way of interventional direction for the patient. Differential for her symptoms includes among other things Mnire's disease and vestibular neuritis/labrynthitis.  Patient also endorsing significant anxiety attacks and carries a history of bradycardia She reports a moderate perception of disability related to her dizziness scoring 48/100 on the [place].  She also reports low balance confidence scoring 58.125% on the ABC.  Patient reports that she was at told by her PCP that she has signs of early stage Parkinson's disease and she does demonstrate a mildly festinating gait pattern with slow gait speed and flat  affect.  We will proceed with additional balance measures at next visit and initiate balance training as well as strengthening. Pt presents with deficits in strength, gait and balance. She will benefit from skilled PT services to address deficits in balance and decrease risk for future falls.    Personal Factors and Comorbidities Age;Comorbidity 3+    Comorbidities Bipolar 1, anxiety, HTN, bradycardia, cataracts, CAD    Examination-Activity Limitations Locomotion Level;Squat;Transfers    Examination-Participation Restrictions Cleaning;Community Activity;Driving;Meal Prep;Shop    Stability/Clinical Decision Making Unstable/Unpredictable    Clinical Decision Making High    Rehab Potential Fair    PT Frequency 2x / week    PT Duration 8 weeks    PT Treatment/Interventions ADLs/Self Care Home Management;Aquatic Therapy;Biofeedback;Canalith Repostioning;Cryotherapy;Electrical Stimulation;Iontophoresis 4mg /ml Dexamethasone;Moist Heat;Traction;Ultrasound;DME Instruction;Gait training;Functional mobility training;Therapeutic activities;Therapeutic exercise;Balance training;Neuromuscular re-education;Patient/family education;Manual techniques;Passive range of motion;Dry needling;Vestibular;Joint Manipulations    PT Next Visit Plan Perform BERG and TUG, initiate HEP (VOR x 1 horizontal and balance exercises), initiate balance and strengthening with patient.    PT Home Exercise Plan None currently    Consulted and Agree with Plan of Care Patient           Patient will benefit from skilled therapeutic intervention in order to improve the following deficits and impairments:  Abnormal gait, Decreased balance, Decreased strength, Dizziness  Visit Diagnosis: Dizziness and giddiness     Problem List Patient Active Problem List   Diagnosis Date Noted  . BPPV (benign paroxysmal positional vertigo), left 02/05/2020  . Near syncope 02/04/2020  . Mass of appendix 06/21/2018  . Anorexia 07/09/2015  .  Severe bipolar I disorder with depression ([place]) 11/16/2014  . Enteritis 10/17/2014  . Gastroenteritis, non-infectious 10/17/2014  . Chronic kidney disease (CKD), stage III (moderate) ([place]) 09/27/2014  . 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  . H/O nutritional disorder 09/27/2014  . HLD (hyperlipidemia) 09/27/2014  . Heart valve incompetence 09/27/2014  . Mitral and aortic incompetence 09/27/2014  . Personal history of other endocrine, nutritional and metabolic disease 66/44/0347  . Hypothyroidism, postop 09/27/2014  . Bipolar disorder, current episode depressed, severe, without psychotic  features ([place])   . Bipolar affective disorder, current episode depressed ([place]) 09/25/2014  . Bipolar I disorder, most recent episode depressed ([place]) 09/25/2014  . Chronic kidney disease, stage IV (severe) ([place]) 09/19/2014  . Pure hypercholesterolemia 09/19/2014  . Acquired hypothyroidism 03/29/2014  . Bipolar affective disorder ([place]) 03/29/2014  . Bradycardia 03/29/2014  . Essential (primary) hypertension 03/29/2014  . MI (mitral incompetence) 03/29/2014  . Acquired cyst of kidney 06/18/2012    Phillips Grout PT, DPT, GCS  Kojo Liby 02/28/2020, 3:35 PM  [place] [facility] [facility] SERVICES [address] [place], [place], [address] Phone: [phone]   Fax:  [phone]  Name: Stephanie Anthony MRN: [id number] Date of Birth: February 10, 1934

## 2020-03-06 ENCOUNTER — Ambulatory Visit: Payer: Medicare Other

## 2020-03-06 ENCOUNTER — Other Ambulatory Visit: Payer: Self-pay

## 2020-03-06 DIAGNOSIS — R42 Dizziness and giddiness: Secondary | ICD-10-CM | POA: Diagnosis not present

## 2020-03-06 NOTE — Therapy (Signed)
[place] MAIN [facility] SERVICES [address] [place], [place], [address] Phone: [phone]   Fax:  [phone]  Physical Therapy Treatment  Patient Details  Name: Stephanie Anthony MRN: [id number] Date of Birth: 1934-01-15 Referring Provider (PT): Dr. Netty Starring   Encounter Date: 03/06/2020   PT End of Session - 03/06/20 1237    Visit Number 2    Number of Visits 17    Date for PT Re-Evaluation 04/23/20    Authorization Type eval: 02/27/20    PT Start Time 1345    PT Stop Time 1430    PT Time Calculation (min) 45 min    Equipment Utilized During Treatment Gait belt    Activity Tolerance Patient tolerated treatment well    Behavior During Therapy [facility] for tasks assessed/performed           Past Medical History:  Diagnosis Date  . Arthritis   . Bipolar 1 disorder ([place])   . Cataracts, bilateral   . Chronic kidney disease    stage 3  . Chronic kidney disease   . Chronic mitral valve disease   . Coronary artery disease   . Deviated nasal septum   . Erosive esophagitis   . Erosive esophagitis   . Esophageal stricture   . Fibroid tumor   . GERD (gastroesophageal reflux disease)   . History of hiatal hernia   . Hypertension   . Hypothyroidism   . Murmur, cardiac   . 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 Pure hypercholesterolemia   . Renal cyst, left    bilateral  . Seasonal allergies     Past Surgical History:  Procedure Laterality Date  . CARDIAC CATHETERIZATION    . CHOLECYSTECTOMY    . COLONOSCOPY WITH PROPOFOL N/A 06/01/2018   Procedure: COLONOSCOPY WITH PROPOFOL;  Surgeon: [name], MD;  Location: [facility] ENDOSCOPY;  Service: Endoscopy;  Laterality: N/A;  . ESOPHAGEAL DILATION    . ESOPHAGOGASTRODUODENOSCOPY (EGD) WITH PROPOFOL N/A 10/20/2014   Procedure: ESOPHAGOGASTRODUODENOSCOPY (EGD) WITH PROPOFOL;  Surgeon: [name], MD;  Location: [facility] ENDOSCOPY;  Service: Endoscopy;  Laterality: N/A;  .  ESOPHAGOGASTRODUODENOSCOPY (EGD) WITH PROPOFOL N/A 06/01/2018   Procedure: ESOPHAGOGASTRODUODENOSCOPY (EGD) WITH PROPOFOL;  Surgeon: [name], MD;  Location: [facility] ENDOSCOPY;  Service: Endoscopy;  Laterality: N/A;  . INFECTED SKIN DEBRIDEMENT     on back several years ago  . JOINT REPLACEMENT    . knee replacement, right    . LAPAROSCOPIC APPENDECTOMY N/A 06/21/2018   Procedure: APPENDECTOMY LAPAROSCOPIC;  Surgeon: Herbert Pun, MD;  Location: [facility] ORS;  Service: General;  Laterality: N/A;  . NOSE SURGERY    . THYROIDECTOMY    . TOTAL THYROIDECTOMY      There were no vitals filed for this visit.   Subjective Assessment - 03/06/20 1237    Subjective Patient reports she is doing all right upon arrival today.  She states that she had a lot of dizziness this morning after getting out of bed.  She denies any falls since last therapy session.  No specific questions upon arrival.    Pertinent History History borrowed from medical record and confirmed/edited with patient. Stephanie Anthony is a 86 y.o. female with medical history significant of bipolar disorder, mitral valve disease, CAD, hypertension, bradycardia, GERD and multiple comorbidities who presented to [facility] ED on 02/04/20 with complaint of dizziness.  Patient lives alone. According to patient, at around 9 AM that morning, while she was laying in  her couch, she initially felt very hot followed by sweating and then she suddenly felt dizzy. She reports that her legs were shaking and she felt very "clammy."  She tried to get off her couch multiple times but she continued to feel dizzy with any sort of movement and then she had couple of episodes of vomiting.  The phone was on the chair next to the couch and she called her son who called EMS and she was transported to the ED. Patient denied having had any chest pain, shortness of breath, abdominal pain, any problem with urination or with bowel movements. Upon arrival to ED, patient was  bradycardic with near normal blood pressure. CT head was done which was unremarkable. CBC within normal range.  BMP at her baseline. EKG showed sinus bradycardia.  Despite of getting Antivert in the ED, she was still dizzy and having imbalance. She lives alone so hospital service were consulted to admit the patient for observation. Patient was admitted with major diagnosis of vertigo.  It was thought to be either vestibular neuritis or BPPV.  She also had mild bradycardia but she carries a history of bradycardia in the past.  Cardiac enzymes were checked which were slightly elevated but flat.  Patient did not have any chest pain or shortness of breath.  Patient was seen by PT for vestibular therapies and per their evaluation, patient likely has left-sided BPPV and although they cleared the patient for discharge but recommended outpatient PT referral for further vestibular therapies which has been ordered for patient. Pt had follow-up with her PCP on 02/09/20. She was still reporting symptoms at that time but they were not inhibiting function. She continues to report feeling "unsure" of herself with concerns for dizziness during walking. Pt reports that she has had "this dizziness problem for years." She states that the first time it happened was in the 1970's. She states that she gets these episodes once every five years or so. She reports that the severe symptoms have resolved and now she is only having unsteadiness.    Patient Stated Goals Improve dizziness/balance    Currently in Pain? Other (Comment)   Unrelated to current episode             [facility] PT Assessment - 03/06/20 0001      Standardized Balance Assessment   Standardized Balance Assessment Berg Balance Test      Berg Balance Test   Sit to Stand Able to stand without using hands and stabilize independently    Standing Unsupported Able to stand safely 2 minutes    Sitting with Back Unsupported but Feet Supported on Floor or Stool Able to  sit safely and securely 2 minutes    Stand to Sit Sits safely with minimal use of hands    Transfers Able to transfer safely, minor use of hands    Standing Unsupported with Eyes Closed Able to stand 10 seconds safely    Standing Unsupported with Feet Together Able to place feet together independently and stand 1 minute safely    From Standing, Reach Forward with Outstretched Arm Can reach forward >5 cm safely (2")    From Standing Position, Pick up Object from Floor Able to pick up shoe, needs supervision    From Standing Position, Turn to Look Behind Over each Shoulder Looks behind from both sides and weight shifts well    Turn 360 Degrees Able to turn 360 degrees safely but slowly    Standing Unsupported, Alternately Place  Feet on Step/Stool Able to complete 4 steps without aid or supervision    Standing Unsupported, One Foot in Front Able to plae foot ahead of the other independently and hold 30 seconds    Standing on One Leg Tries to lift leg/unable to hold 3 seconds but remains standing independently    Total Score 45               TREATMENT   Ther-ex  NuStep L2 x 5 minutes for warm-up (unbilled); Sit to stand without upper extremity support x10; Seated clams with manual resistance from therapist; Seated adductor squeezes with manual resistance from therapist;   Neuromuscular Re-education  Performed BERG (45/56) and TUG (18.0s) with patient; VOR x 1 horizontal in sitting 3 x 60s (4/10 dizziness first rep but no dizziness during subsequent reps); NBOS eyes open/close x 30s each; NBOS horizontal and vertical head turns x 30s each; NBOS head with shoulder turns (turn from waist) x 30s; Airex alternating 6" step taps x 10 BLE; Airex semitandem balance with front foot on 6" step and rear foot on Airex pad x 30s each; Airex NBOS head with shoulder turns (turn from waist) x 30s;   Pt educated throughout session about proper posture and technique with exercises. Improved  exercise technique, movement at target joints, use of target muscles after min to mod verbal, visual, tactile cues.    Patient demonstrates excellent motivation during session.  Performed BERG with patient as well as TUG and pt scored 45/56 and 18.0s respectively indicating increased fall risk.  Initiated VOR x1 horizontal with patient however she struggles to perform correctly and complains of of increase in neck pain during exercise.  She struggles with balance on unstable Airex pad.  Deferred initiation of HEP until next session.  Patient encouraged to follow-up as scheduled. Pt will benefit from PT services to address deficits in strength, balance, and mobility in order to return to full function at home.                       PT Short Term Goals - 02/28/20 1529      PT SHORT TERM GOAL #1   Title Pt will be independent with HEP in order to improve strength and balance in order to decrease fall risk and improve function at home and work.    Time 4    Period Weeks    Status New    Target Date 03/26/20             PT Long Term Goals - 03/06/20 1438      PT LONG TERM GOAL #1   Title Pt will improve ABC by at least 13% in order to demonstrate clinically significant improvement in balance confidence.    Baseline 02/27/20: 58.125%    Time 8    Period Weeks    Status New    Target Date 04/23/20      PT LONG TERM GOAL #2   Title Pt will decrease 5TSTS by at least 3 seconds in order to demonstrate clinically significant improvement in LE strength.    Baseline 02/27/20: 19.7s    Time 8    Period Weeks    Status New    Target Date 04/23/20      PT LONG TERM GOAL #3   Title Pt will decrease DHI score by at least 18 points in order to demonstrate clinically significant reduction in disability    Baseline 02/27/20: 48/100  Time 8    Period Weeks    Status New    Target Date 04/23/20      PT LONG TERM GOAL #4   Title Pt will improve FOTO score to 60 in order to  demonstrate clinically significant improvement in function.    Baseline 02/27/20: 40    Time 8    Period Weeks    Status New    Target Date 04/23/20      PT LONG TERM GOAL #5   Title Pt will improve BERG by at least 3 points in order to demonstrate clinically significant improvement in balance.    Baseline 03/06/20: 45/56    Time 8    Period Weeks    Status New    Target Date 05/01/20                 Plan - 03/06/20 1237    Clinical Impression Statement Patient demonstrates excellent motivation during session.  Performed BERG with patient as well as TUG and pt scored 45/56 and 18.0s respectively indicating increased fall risk.  Initiated VOR x1 horizontal with patient however she struggles to perform correctly and complains of of increase in neck pain during exercise.  She struggles with balance on unstable Airex pad.  Deferred initiation of HEP until next session.  Patient encouraged to follow-up as scheduled. Pt will benefit from PT services to address deficits in strength, balance, and mobility in order to return to full function at home.    Personal Factors and Comorbidities Age;Comorbidity 3+    Comorbidities Bipolar 1, anxiety, HTN, bradycardia, cataracts, CAD    Examination-Activity Limitations Locomotion Level;Squat;Transfers    Examination-Participation Restrictions Cleaning;Community Activity;Driving;Meal Prep;Shop    Stability/Clinical Decision Making Unstable/Unpredictable    Rehab Potential Fair    PT Frequency 2x / week    PT Duration 8 weeks    PT Treatment/Interventions ADLs/Self Care Home Management;Aquatic Therapy;Biofeedback;Canalith Repostioning;Cryotherapy;Electrical Stimulation;Iontophoresis 4mg /ml Dexamethasone;Moist Heat;Traction;Ultrasound;DME Instruction;Gait training;Functional mobility training;Therapeutic activities;Therapeutic exercise;Balance training;Neuromuscular re-education;Patient/family education;Manual techniques;Passive range of motion;Dry  needling;Vestibular;Joint Manipulations    PT Next Visit Plan Continue balance and strength exercises, issue HEP    PT Home Exercise Plan None currently    Consulted and Agree with Plan of Care Patient           Patient will benefit from skilled therapeutic intervention in order to improve the following deficits and impairments:  Abnormal gait, Decreased balance, Decreased strength, Dizziness  Visit Diagnosis: Dizziness and giddiness     Problem List Patient Active Problem List   Diagnosis Date Noted  . BPPV (benign paroxysmal positional vertigo), left 02/05/2020  . Near syncope 02/04/2020  . Mass of appendix 06/21/2018  . Anorexia 07/09/2015  . Severe bipolar I disorder with depression ([place]) 11/16/2014  . Enteritis 10/17/2014  . Gastroenteritis, non-infectious 10/17/2014  . Chronic kidney disease (CKD), stage III (moderate) ([place]) 09/27/2014  . 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  . H/O nutritional disorder 09/27/2014  . HLD (hyperlipidemia) 09/27/2014  . Heart valve incompetence 09/27/2014  . Mitral and aortic incompetence 09/27/2014  . Personal history of other endocrine, nutritional and metabolic disease 56/38/7564  . Hypothyroidism, postop 09/27/2014  . Bipolar disorder, current episode depressed, severe, without psychotic features ([place])   . Bipolar affective disorder, current episode depressed ([place]) 09/25/2014  . Bipolar I disorder, most recent episode depressed ([place]) 09/25/2014  . Chronic kidney disease, stage IV (severe) ([place]) 09/19/2014  . Pure hypercholesterolemia 09/19/2014  . Acquired hypothyroidism 03/29/2014  . Bipolar affective disorder ([place])  03/29/2014  . Bradycardia 03/29/2014  . Essential (primary) hypertension 03/29/2014  . MI (mitral incompetence) 03/29/2014  . Acquired cyst of kidney 06/18/2012   Phillips Grout PT, DPT, GCS  Anthem Frazer 03/06/2020, 2:39 PM  [place] MAIN [facility]  SERVICES [address] [place], [place], [address] Phone: [phone]   Fax:  [phone]  Name: Stephanie Davidoff MRN: [id number] Date of Birth: 1934-04-22

## 2020-03-13 ENCOUNTER — Ambulatory Visit: Payer: Medicare Other

## 2020-03-13 DIAGNOSIS — R42 Dizziness and giddiness: Secondary | ICD-10-CM

## 2020-03-26 ENCOUNTER — Ambulatory Visit: Payer: Medicare Other

## 2020-04-02 ENCOUNTER — Other Ambulatory Visit: Payer: Self-pay

## 2020-04-02 ENCOUNTER — Ambulatory Visit: Payer: Medicare Other | Attending: Family Medicine

## 2020-04-02 VITALS — BP 134/74 | HR 77

## 2020-04-02 DIAGNOSIS — R42 Dizziness and giddiness: Secondary | ICD-10-CM | POA: Insufficient documentation

## 2020-04-02 NOTE — Therapy (Signed)
[place] MAIN [facility] SERVICES [address] [place], [place], [address] Phone: [phone]   Fax:  [phone]  Physical Therapy Treatment  Patient Details  Name: Stephanie Anthony MRN: [id number] Date of Birth: 07/28/1933 Referring Provider (PT): Dr. Netty Starring   Encounter Date: 04/02/2020   PT End of Session - 04/02/20 1400    Visit Number 3    Number of Visits 17    Date for PT Re-Evaluation 04/23/20    Authorization Type eval: 02/27/20    PT Start Time 1300    PT Stop Time 1345    PT Time Calculation (min) 45 min    Equipment Utilized During Treatment Gait belt    Activity Tolerance Patient tolerated treatment well;Patient limited by fatigue    Behavior During Therapy [facility] for tasks assessed/performed           Past Medical History:  Diagnosis Date  . Arthritis   . Bipolar 1 disorder ([place])   . Cataracts, bilateral   . Chronic kidney disease    stage 3  . Chronic kidney disease   . Chronic mitral valve disease   . Coronary artery disease   . Deviated nasal septum   . Erosive esophagitis   . Erosive esophagitis   . Esophageal stricture   . Fibroid tumor   . GERD (gastroesophageal reflux disease)   . History of hiatal hernia   . Hypertension   . Hypothyroidism   . Murmur, cardiac   . 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 Pure hypercholesterolemia   . Renal cyst, left    bilateral  . Seasonal allergies     Past Surgical History:  Procedure Laterality Date  . CARDIAC CATHETERIZATION    . CHOLECYSTECTOMY    . COLONOSCOPY WITH PROPOFOL N/A 06/01/2018   Procedure: COLONOSCOPY WITH PROPOFOL;  Surgeon: [name], MD;  Location: [facility] ENDOSCOPY;  Service: Endoscopy;  Laterality: N/A;  . ESOPHAGEAL DILATION    . ESOPHAGOGASTRODUODENOSCOPY (EGD) WITH PROPOFOL N/A 10/20/2014   Procedure: ESOPHAGOGASTRODUODENOSCOPY (EGD) WITH PROPOFOL;  Surgeon: [name], MD;  Location: [facility] ENDOSCOPY;  Service: Endoscopy;   Laterality: N/A;  . ESOPHAGOGASTRODUODENOSCOPY (EGD) WITH PROPOFOL N/A 06/01/2018   Procedure: ESOPHAGOGASTRODUODENOSCOPY (EGD) WITH PROPOFOL;  Surgeon: [name], MD;  Location: [facility] ENDOSCOPY;  Service: Endoscopy;  Laterality: N/A;  . INFECTED SKIN DEBRIDEMENT     on back several years ago  . JOINT REPLACEMENT    . knee replacement, right    . LAPAROSCOPIC APPENDECTOMY N/A 06/21/2018   Procedure: APPENDECTOMY LAPAROSCOPIC;  Surgeon: Herbert Pun, MD;  Location: [facility] ORS;  Service: General;  Laterality: N/A;  . NOSE SURGERY    . THYROIDECTOMY    . TOTAL THYROIDECTOMY      Vitals:   04/02/20 1318  BP: 134/74  Pulse: 77  SpO2: 100%     Subjective Assessment - 04/02/20 1358    Subjective Patient reports that she feels weak and "wobbly" today. She states she was unable to attend last week's appointment due to increased vertigo symptoms, vomiting, and unable to get out of bed. She denies of any new falls since the last therapy session.    Pertinent History History borrowed from medical record and confirmed/edited with patient. Stephanie Anthony is a 86 y.o. female with medical history significant of bipolar disorder, mitral valve disease, CAD, hypertension, bradycardia, GERD and multiple comorbidities who presented to [facility] ED on 02/04/20 with complaint of dizziness.  Patient lives alone. According to  patient, at around 9 AM that morning, while she was laying in her couch, she initially felt very hot followed by sweating and then she suddenly felt dizzy. She reports that her legs were shaking and she felt very "clammy."  She tried to get off her couch multiple times but she continued to feel dizzy with any sort of movement and then she had couple of episodes of vomiting.  The phone was on the chair next to the couch and she called her son who called EMS and she was transported to the ED. Patient denied having had any chest pain, shortness of breath, abdominal pain, any problem with  urination or with bowel movements. Upon arrival to ED, patient was bradycardic with near normal blood pressure. CT head was done which was unremarkable. CBC within normal range.  BMP at her baseline. EKG showed sinus bradycardia.  Despite of getting Antivert in the ED, she was still dizzy and having imbalance. She lives alone so hospital service were consulted to admit the patient for observation. Patient was admitted with major diagnosis of vertigo.  It was thought to be either vestibular neuritis or BPPV.  She also had mild bradycardia but she carries a history of bradycardia in the past.  Cardiac enzymes were checked which were slightly elevated but flat.  Patient did not have any chest pain or shortness of breath.  Patient was seen by PT for vestibular therapies and per their evaluation, patient likely has left-sided BPPV and although they cleared the patient for discharge but recommended outpatient PT referral for further vestibular therapies which has been ordered for patient. Pt had follow-up with her PCP on 02/09/20. She was still reporting symptoms at that time but they were not inhibiting function. She continues to report feeling "unsure" of herself with concerns for dizziness during walking. Pt reports that she has had "this dizziness problem for years." She states that the first time it happened was in the 1970's. She states that she gets these episodes once every five years or so. She reports that the severe symptoms have resolved and now she is only having unsteadiness.    Patient Stated Goals Improve dizziness/balance    Currently in Pain? Yes    Pain Score 3     Pain Location Back    Pain Descriptors / Indicators Aching    Pain Type Chronic pain             Ther-ex  NuStep L2 x 5 minutes for warm-up (unbilled); Sit to stand without upper extremity support x10; Seated clams with yellow band x 2 sets of 10 reps Seated marches with 2# x 2 sets of 10 reps Seated LAQ with 2# x 2 sets of  10 reps Seated ankle DF/PF with 2# x 2 sets of 10 reps Step ups at stairs using bilateral railings x 2 sets of 10 reps  Supine SLR 2 x 10 reps Supine bridges 2 x 10 reps   Neuromuscular Re-education  NBOS eyes open/close x 30s each x 2 reps; NBOS horizontal and vertical head turns x 30s each x 2 reps; NBOS head with shoulder turns (turn from waist) x 30s x 2 reps;   Patient was reassessed for BPPV due to recent reporting of increased dizziness and vertigo symptoms last week. Patient had unremarkable findings and therapy session was proceeded with strengthening and balance exercises. Patient was encouraged to reach out with her PCP for a follow up due to recurring dizziness and vomitting episodes. Patient tolerated  strengthening exercises with increased ms fatigue as evidenced by increased therapeutic rest breaks. Therapist provided verbal and tactile cues for proper placement of knees and hips when completing supine exercises. Patient will benefit from PT services to address deficits in strength, balance, and mobility in order to return to full function at home.        PT Education - 04/02/20 1359    Education Details HEP program    Person(s) Educated Patient    Methods Explanation;Demonstration;Handout    Comprehension Verbalized understanding;Returned demonstration;Verbal cues required;Tactile cues required            PT Short Term Goals - 02/28/20 1529      PT SHORT TERM GOAL #1   Title Pt will be independent with HEP in order to improve strength and balance in order to decrease fall risk and improve function at home and work.    Time 4    Period Weeks    Status New    Target Date 03/26/20             PT Long Term Goals - 03/06/20 1438      PT LONG TERM GOAL #1   Title Pt will improve ABC by at least 13% in order to demonstrate clinically significant improvement in balance confidence.    Baseline 02/27/20: 58.125%    Time 8    Period Weeks    Status New     Target Date 04/23/20      PT LONG TERM GOAL #2   Title Pt will decrease 5TSTS by at least 3 seconds in order to demonstrate clinically significant improvement in LE strength.    Baseline 02/27/20: 19.7s    Time 8    Period Weeks    Status New    Target Date 04/23/20      PT LONG TERM GOAL #3   Title Pt will decrease DHI score by at least 18 points in order to demonstrate clinically significant reduction in disability    Baseline 02/27/20: 48/100    Time 8    Period Weeks    Status New    Target Date 04/23/20      PT LONG TERM GOAL #4   Title Pt will improve FOTO score to 60 in order to demonstrate clinically significant improvement in function.    Baseline 02/27/20: 40    Time 8    Period Weeks    Status New    Target Date 04/23/20      PT LONG TERM GOAL #5   Title Pt will improve BERG by at least 3 points in order to demonstrate clinically significant improvement in balance.    Baseline 03/06/20: 45/56    Time 8    Period Weeks    Status New    Target Date 05/01/20                 Plan - 04/02/20 1410    Clinical Impression Statement Patient was reassessed for BPPV due to recent reporting of increased dizziness and vertigo symptoms last week. Patient had unremarkable findings and therapy session was proceeded with strengthening and balance exercises. Patient was encouraged to reach out with her PCP for a follow up due to recurring dizziness and vomitting episodes. Patient tolerated strengthening exercises with increased ms fatigue as evidenced by increased therapeutic rest breaks. Therapist provided verbal and tactile cues for proper placement of knees and hips when completing supine exercises. Patient will benefit from PT services to address deficits  in strength, balance, and mobility in order to return to full function at home.    Personal Factors and Comorbidities Age;Comorbidity 3+    Comorbidities Bipolar 1, anxiety, HTN, bradycardia, cataracts, CAD     Examination-Activity Limitations Locomotion Level;Squat;Transfers    Examination-Participation Restrictions Cleaning;Community Activity;Driving;Meal Prep;Shop    Stability/Clinical Decision Making Unstable/Unpredictable    Rehab Potential Fair    PT Frequency 2x / week    PT Duration 8 weeks    PT Treatment/Interventions ADLs/Self Care Home Management;Aquatic Therapy;Biofeedback;Canalith Repostioning;Cryotherapy;Electrical Stimulation;Iontophoresis 4mg /ml Dexamethasone;Moist Heat;Traction;Ultrasound;DME Instruction;Gait training;Functional mobility training;Therapeutic activities;Therapeutic exercise;Balance training;Neuromuscular re-education;Patient/family education;Manual techniques;Passive range of motion;Dry needling;Vestibular;Joint Manipulations    PT Next Visit Plan Continue balance and strength exercises, issue HEP    PT Home Exercise Plan None currently    Consulted and Agree with Plan of Care Patient           Patient will benefit from skilled therapeutic intervention in order to improve the following deficits and impairments:  Abnormal gait, Decreased balance, Decreased strength, Dizziness  Visit Diagnosis: Dizziness and giddiness     Problem List Patient Active Problem List   Diagnosis Date Noted  . BPPV (benign paroxysmal positional vertigo), left 02/05/2020  . Near syncope 02/04/2020  . Mass of appendix 06/21/2018  . Anorexia 07/09/2015  . Severe bipolar I disorder with depression ([place]) 11/16/2014  . Enteritis 10/17/2014  . Gastroenteritis, non-infectious 10/17/2014  . Chronic kidney disease (CKD), stage III (moderate) ([place]) 09/27/2014  . 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  . H/O nutritional disorder 09/27/2014  . HLD (hyperlipidemia) 09/27/2014  . Heart valve incompetence 09/27/2014  . Mitral and aortic incompetence 09/27/2014  . Personal history of other endocrine, nutritional and metabolic disease 84/66/5993  . Hypothyroidism, postop  09/27/2014  . Bipolar disorder, current episode depressed, severe, without psychotic features ([place])   . Bipolar affective disorder, current episode depressed ([place]) 09/25/2014  . Bipolar I disorder, most recent episode depressed ([place]) 09/25/2014  . Chronic kidney disease, stage IV (severe) ([place]) 09/19/2014  . Pure hypercholesterolemia 09/19/2014  . Acquired hypothyroidism 03/29/2014  . Bipolar affective disorder ([place]) 03/29/2014  . Bradycardia 03/29/2014  . Essential (primary) hypertension 03/29/2014  . MI (mitral incompetence) 03/29/2014  . Acquired cyst of kidney 06/18/2012    Karl Luke PT, DPT Netta Corrigan 04/02/2020, 6:01 PM  [place] MAIN [facility] SERVICES [address] [place], [place], [address] Phone: [phone]   Fax:  [phone]  Name: Andriea Hasegawa MRN: [id number] Date of Birth: [birth year]

## 2020-04-09 ENCOUNTER — Ambulatory Visit: Payer: Medicare Other

## 2020-04-16 ENCOUNTER — Ambulatory Visit: Payer: Medicare Other

## 2020-04-23 ENCOUNTER — Ambulatory Visit: Payer: Medicare Other

## 2021-01-28 DIAGNOSIS — Z8616 Personal history of COVID-19: Secondary | ICD-10-CM | POA: Insufficient documentation

## 2021-07-31 DIAGNOSIS — J302 Other seasonal allergic rhinitis: Secondary | ICD-10-CM | POA: Insufficient documentation
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
## 2022-04-01 ENCOUNTER — Encounter: Payer: Self-pay | Admitting: Psychiatry

## 2022-04-01 ENCOUNTER — Ambulatory Visit (INDEPENDENT_AMBULATORY_CARE_PROVIDER_SITE_OTHER): Payer: Medicare Other | Admitting: Psychiatry

## 2022-04-01 VITALS — BP 154/72 | HR 76 | Temp 97.1°F | Ht 67.0 in | Wt 182.2 lb

## 2022-04-01 DIAGNOSIS — F3131 Bipolar disorder, current episode depressed, mild: Secondary | ICD-10-CM

## 2022-04-01 MED ORDER — ARIPIPRAZOLE 2 MG PO TABS: 2.0000 mg | ORAL_TABLET | Freq: Every day | ORAL | 0 refills | Status: DC

## 2022-04-01 MED ORDER — LAMOTRIGINE 100 MG PO TABS: 100.0000 mg | ORAL_TABLET | Freq: Every day | ORAL | 0 refills | Status: DC

## 2022-05-27 ENCOUNTER — Other Ambulatory Visit: Payer: Self-pay

## 2022-05-27 ENCOUNTER — Emergency Department: Payer: Medicare Other

## 2022-05-27 ENCOUNTER — Emergency Department
Admission: EM | Admit: 2022-05-27 | Discharge: 2022-05-27 | Disposition: A | Discharge status: Home / Self Care | Payer: Medicare Other | Attending: Emergency Medicine | Admitting: Emergency Medicine

## 2022-05-27 DIAGNOSIS — K4090 Unilateral inguinal hernia, without obstruction or gangrene, not specified as recurrent: Secondary | ICD-10-CM | POA: Diagnosis not present

## 2022-05-27 DIAGNOSIS — K5790 Diverticulosis of intestine, part unspecified, without perforation or abscess without bleeding: Secondary | ICD-10-CM | POA: Insufficient documentation

## 2022-05-27 DIAGNOSIS — E860 Dehydration: Secondary | ICD-10-CM

## 2022-05-27 DIAGNOSIS — K760 Fatty (change of) liver, not elsewhere classified: Secondary | ICD-10-CM | POA: Insufficient documentation

## 2022-05-27 DIAGNOSIS — Z87440 Personal history of urinary (tract) infections: Secondary | ICD-10-CM | POA: Diagnosis not present

## 2022-05-27 DIAGNOSIS — K449 Diaphragmatic hernia without obstruction or gangrene: Secondary | ICD-10-CM | POA: Insufficient documentation

## 2022-05-27 DIAGNOSIS — I700 Atherosclerosis of aorta: Secondary | ICD-10-CM | POA: Diagnosis not present

## 2022-05-27 DIAGNOSIS — R197 Diarrhea, unspecified: Secondary | ICD-10-CM

## 2022-05-27 LAB — CBC
HCT: 42.1 % (ref 36.0–46.0)
Hemoglobin: 13.8 g/dL (ref 12.0–15.0)
MCH: 25.3 pg — ABNORMAL LOW (ref 26.0–34.0)
MCHC: 32.8 g/dL (ref 30.0–36.0)
MCV: 77.2 fL — ABNORMAL LOW (ref 80.0–100.0)
Platelets: 213 10*3/uL (ref 150–400)
RBC: 5.45 MIL/uL — ABNORMAL HIGH (ref 3.87–5.11)
RDW: 14.5 % (ref 11.5–15.5)
WBC: 10.2 10*3/uL (ref 4.0–10.5)
nRBC: 0 % (ref 0.0–0.2)

## 2022-05-27 LAB — COMPREHENSIVE METABOLIC PANEL
ALT: 12 U/L (ref 0–44)
AST: 22 U/L (ref 15–41)
Albumin: 4.2 g/dL (ref 3.5–5.0)
Alkaline Phosphatase: 72 U/L (ref 38–126)
Anion gap: 12 (ref 5–15)
BUN: 18 mg/dL (ref 8–23)
CO2: 19 mmol/L — ABNORMAL LOW (ref 22–32)
Calcium: 9.7 mg/dL (ref 8.9–10.3)
Chloride: 109 mmol/L (ref 98–111)
Creatinine, Ser: 2 mg/dL — ABNORMAL HIGH (ref 0.44–1.00)
GFR, Estimated: 24 mL/min — ABNORMAL LOW (ref >60)
Glucose, Bld: 99 mg/dL (ref 70–99)
Potassium: 4 mmol/L (ref 3.5–5.1)
Sodium: 140 mmol/L (ref 135–145)
Total Bilirubin: 0.8 mg/dL (ref 0.3–1.2)
Total Protein: 7.9 g/dL (ref 6.5–8.1)

## 2022-05-27 LAB — PROTIME-INR
INR: 1.1 (ref 0.8–1.2)
Prothrombin Time: 13.9 seconds (ref 11.4–15.2)

## 2022-05-27 LAB — TYPE AND SCREEN
ABO/RH(D): A POS
Antibody Screen: NEGATIVE

## 2022-05-27 LAB — MAGNESIUM: Magnesium: 2.1 mg/dL (ref 1.7–2.4)

## 2022-05-27 NOTE — Discharge Instructions (Addendum)
Please use a probiotic (you may also use any food with active cultures to include yogurt, kefir, etc) daily

## 2022-05-27 NOTE — ED Provider Triage Note (Signed)
Emergency Medicine Provider Triage Evaluation Note  Stephanie Anthony , a 89 y.o. female  was evaluated in triage.  Pt complains of diarrhea x 5 days. Started as yellow then bright red, then dark today. No relief with Kaopectate. No fever. She has had nausea, but no vomiting. No abdominal pain.  Physical Exam  There were no vitals taken for this visit. Gen:   Awake, no distress   Resp:  Normal effort  MSK:   Moves extremities without difficulty  Other:    Medical Decision Making  Medically screening exam initiated at 4:07 PM.  Appropriate orders placed.  Stephanie Anthony was informed that the remainder of the evaluation will be completed by another provider, this initial triage assessment does not replace that evaluation, and the importance of remaining in the ED until their evaluation is complete.    Victorino Dike, FNP 05/27/22 1611

## 2022-05-27 NOTE — ED Provider Notes (Signed)
[facility] Provider Note   Event Date/Time   First MD Initiated Contact with Patient 05/27/22 2051     (approximate) History  Diarrhea  HPI Stephanie Anthony is a 89 y.o. female who presents for 2 months of intermittent diarrhea after being diagnosed with a UTI and placed on antibiotics.  Patient states that she has tried Kaopectate and Imodium with almost resolution of her diarrhea with Kaopectate.  Patient denies any fevers, nausea/vomiting, abdominal pain, or recent travel/sick contacts within this past 2 months ROS: Patient currently denies any vision changes, tinnitus, difficulty speaking, facial droop, sore throat, chest pain, shortness of breath, abdominal pain, nausea/vomiting, dysuria, or weakness/numbness/paresthesias in any extremity   Physical Exam  Triage Vital Signs: ED Triage Vitals  Enc Vitals Group     BP 05/27/22 1608 (!) 175/67     Pulse Rate 05/27/22 1608 75     Resp 05/27/22 1608 16     Temp 05/27/22 1608 98.9 F (37.2 C)     Temp Source 05/27/22 1608 Oral     SpO2 05/27/22 1608 98 %     Weight 05/27/22 1607 185 lb (83.9 kg)     Height 05/27/22 1607 '5\' 7"'$  (1.702 m)     Head Circumference --      Peak Flow --      Pain Score 05/27/22 1607 0     Pain Loc --      Pain Edu? --      Excl. in [place]? --    Most recent vital signs: Vitals:   05/27/22 2155 05/27/22 2230  BP: (!) 147/60 (!) 144/77  Pulse: 91 64  Resp: 18 18  Temp:  98.5 F (36.9 C)  SpO2: 97% 98%   General: Awake, oriented x4. CV:  Good peripheral perfusion.  Resp:  Normal effort.  Abd:  No distention.  Other:  Elderly Caucasian female laying in bed in no acute distress ED Results / Procedures / Treatments  Labs (all labs ordered are listed, but only abnormal results are displayed) Labs Reviewed  COMPREHENSIVE METABOLIC PANEL - Abnormal; Notable for the following components:      Result Value   CO2 19 (*)    Creatinine, Ser 2.00 (*)    GFR, Estimated 24 (*)     All other components within normal limits  CBC - Abnormal; Notable for the following components:   RBC 5.45 (*)    MCV 77.2 (*)    MCH 25.3 (*)    All other components within normal limits  C DIFFICILE QUICK SCREEN W PCR REFLEX    PROTIME-INR  MAGNESIUM  POC OCCULT BLOOD, ED  TYPE AND SCREEN   EKG ED ECG REPORT I, Naaman Plummer, the attending physician, personally viewed and interpreted this ECG. Date: 05/27/2022 EKG Time: 1614 Rate: 70 Rhythm: normal sinus rhythm QRS Axis: normal Intervals: normal ST/T Wave abnormalities: normal Narrative Interpretation: no evidence of acute ischemia RADIOLOGY ED MD interpretation: CT of the abdomen and pelvis without IV contrast shows fluid and contrast levels in the colon but no wall thickening or inflammatory changes consistent with patient's diarrhea -Agree with radiology assessment Official radiology report(s): CT ABDOMEN PELVIS WO CONTRAST  Result Date: 05/27/2022 CLINICAL DATA:  Diarrhea for 5 days, occasionally dark red color. EXAM: CT ABDOMEN AND PELVIS WITHOUT CONTRAST TECHNIQUE: Multidetector CT imaging of the abdomen and pelvis was performed following the standard protocol without IV contrast. RADIATION DOSE REDUCTION: This exam was performed according to the  departmental dose-optimization program which includes automated exposure control, adjustment of the mA and/or kV according to patient size and/or use of iterative reconstruction technique. COMPARISON:  CTs with oral contrast only dated 12/22/2018 and 04/30/2018 FINDINGS: Lower chest: There are few linear scar-like opacities in the left lung base. Lung bases are clear of infiltrates. A small left posterior diaphragmatic fat herniation is again shown. There is a small hiatal hernia. There is no pericardial effusion. There are increasing calcifications in the inferior mitral ring. Hepatobiliary: The liver is 20 cm in length and moderately steatotic. There is a calcified granuloma in the  right lobe. No other focal abnormality is seen without contrast. Gallbladder is absent, with no biliary dilatation. Pancreas: Partially atrophic, otherwise unremarkable without contrast. Spleen: Scattered calcified granulomas. Otherwise unremarkable without contrast. No splenomegaly. Adrenals/Urinary Tract: There is no adrenal mass. There are small stable bilateral renal cysts with no other contour deforming abnormality visible without contrast. There is no urinary stone or obstruction. There is mild bladder thickening versus underdistention. Stomach/Bowel: Small hiatal hernia. Unremarkable gastric wall. 3 cm diverticulum of the medial aspect of the descending duodenal segment is again noted, with air-fluid level. There is no small bowel obstruction or inflammation. Post appendectomy changes are again shown. There are fluid and contrast levels in the colonbut no wall thickening or inflammatory change. There is uncomplicated sigmoid diverticulosis. Vascular/Lymphatic: Aortic atherosclerosis. No enlarged abdominal or pelvic lymph nodes. Accessory circumaortic left renal vein is incidentally seen. Reproductive: Uterus and bilateral adnexa are unremarkable. Other: There is no free air, free hemorrhage, free fluid or acute inflammatory change. There is a small umbilical fat hernia but no incarcerated hernias. There are small inguinal fat hernias. Musculoskeletal: There is moderate symmetric hip DJD, mild lumbar dextroscoliosis, osteopenia and facet hypertrophy lumbar spine but no acute or other significant osseous findings. IMPRESSION: 1. Fluid and contrast levels in the colon but no wall thickening or inflammatory change. 2. Diverticulosis without evidence of diverticulitis. 3. Cystitis versus bladder nondistention. 4. Mildly prominent moderately fatty liver. 5. Aortic atherosclerosis. 6. Small hiatal hernia. 7. Umbilical and inguinal fat hernias. 8. Osteopenia and degenerative change. Aortic Atherosclerosis  (ICD10-I70.0). Electronically Signed   By: Telford Nab M.D.   On: 05/27/2022 21:40   PROCEDURES: Critical Care performed: No Procedures MEDICATIONS ORDERED IN ED: Medications - No data to display IMPRESSION / MDM / [place] / ED COURSE  I reviewed the triage vital signs and the nursing notes.                             Patient's presentation is most consistent with acute presentation with potential threat to life or bodily function. This patient presents with diarrhea consistent with likely viral enteritis. Doubt acute bacterial diarrhea. Considered, but think unlikely, partial SBO, appendicitis, diverticulitis, other intraabdominal infection. Low suspicion for secondary causes of diarrhea such as hyperadrenergic state, pheo, adrenal crisis, hyperthyroidism, or sepsis. Doubt antibiotic associated diarrhea.  Plan: PO rehydration, reassess, discharge with OTC antidiarrheal meds//short course antibiotics  Dispo: Discharge home with PCP follow-up and strict return precautions   FINAL CLINICAL IMPRESSION(S) / ED DIAGNOSES   Final diagnoses:  Diarrhea, unspecified type  Dehydration   Rx / DC Orders   ED Discharge Orders     None      Note:  This document was prepared using Dragon voice recognition software and may include unintentional dictation errors.   Naaman Plummer, MD 05/27/22 [phone]

## 2022-05-27 NOTE — ED Triage Notes (Signed)
Pt to ED from home. Pt has had diarrhea since last Thursday. Pt took OTC meds with no relief. Pt denies N/V. Pt advised it was yellow initially and then turned really dark red. Pt is CAOx4 and in no acute distress. Ambulatory in triage.

## 2022-06-17 ENCOUNTER — Ambulatory Visit: Payer: Medicare Other | Admitting: Psychiatry
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
## 2022-07-10 ENCOUNTER — Encounter: Payer: Self-pay | Admitting: Psychiatry

## 2022-07-10 ENCOUNTER — Ambulatory Visit (INDEPENDENT_AMBULATORY_CARE_PROVIDER_SITE_OTHER): Payer: Medicare Other | Admitting: Psychiatry

## 2022-07-10 VITALS — BP 159/67 | HR 76 | Temp 98.1°F | Ht 67.0 in | Wt 174.6 lb

## 2022-07-10 DIAGNOSIS — F3131 Bipolar disorder, current episode depressed, mild: Secondary | ICD-10-CM

## 2022-07-10 DIAGNOSIS — G2401 Drug induced subacute dyskinesia: Secondary | ICD-10-CM | POA: Diagnosis not present

## 2022-07-10 MED ORDER — ARIPIPRAZOLE 2 MG PO TABS: 2.0000 mg | ORAL_TABLET | Freq: Every day | ORAL | 0 refills | Status: DC

## 2022-07-10 MED ORDER — LAMOTRIGINE 100 MG PO TABS: 100.0000 mg | ORAL_TABLET | Freq: Every day | ORAL | 0 refills | Status: DC

## 2022-08-05 DIAGNOSIS — Z8669 Personal history of other diseases of the nervous system and sense organs: Secondary | ICD-10-CM | POA: Insufficient documentation
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
## 2022-09-29 ENCOUNTER — Encounter: Payer: Self-pay | Admitting: Psychiatry

## 2022-09-29 ENCOUNTER — Ambulatory Visit (INDEPENDENT_AMBULATORY_CARE_PROVIDER_SITE_OTHER): Payer: Medicare Other | Admitting: Psychiatry

## 2022-09-29 VITALS — BP 148/68 | HR 62 | Temp 97.6°F | Ht 67.0 in | Wt 174.8 lb

## 2022-09-29 DIAGNOSIS — F3131 Bipolar disorder, current episode depressed, mild: Secondary | ICD-10-CM | POA: Diagnosis not present

## 2022-09-29 MED ORDER — LAMOTRIGINE 100 MG PO TABS: 100.0000 mg | ORAL_TABLET | Freq: Every day | ORAL | 0 refills | Status: DC

## 2022-11-20 ENCOUNTER — Other Ambulatory Visit: Payer: Self-pay | Admitting: Psychiatry
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
## 2022-12-08 ENCOUNTER — Encounter: Payer: Self-pay | Admitting: Psychiatry

## 2022-12-08 ENCOUNTER — Ambulatory Visit (INDEPENDENT_AMBULATORY_CARE_PROVIDER_SITE_OTHER): Payer: Medicare Other | Admitting: Psychiatry

## 2022-12-08 VITALS — BP 153/73 | HR 61 | Temp 97.8°F | Ht 67.0 in | Wt 176.6 lb

## 2022-12-08 DIAGNOSIS — F3131 Bipolar disorder, current episode depressed, mild: Secondary | ICD-10-CM

## 2022-12-08 DIAGNOSIS — G20C Parkinsonism, unspecified: Secondary | ICD-10-CM

## 2022-12-08 MED ORDER — LAMOTRIGINE 100 MG PO TABS: 100.0000 mg | ORAL_TABLET | Freq: Every day | ORAL | 0 refills | Status: DC

## 2022-12-08 NOTE — Patient Instructions (Addendum)
`  1. Continue lamotrigine 100 mg daily  2. Next appointment: 10/17 at 1 pm, office

## 2022-12-08 NOTE — Addendum Note (Signed)
Addended by: Neysa Hotter on: 12/08/2022 02:03 PM   Modules accepted: Orders

## 2022-12-15 ENCOUNTER — Emergency Department: Payer: Medicare Other

## 2022-12-15 ENCOUNTER — Other Ambulatory Visit: Payer: Self-pay

## 2022-12-15 ENCOUNTER — Emergency Department
Admission: EM | Admit: 2022-12-15 | Discharge: 2022-12-15 | Disposition: A | Discharge status: Home / Self Care | Payer: Medicare Other | Source: Home / Self Care | Attending: Emergency Medicine | Admitting: Emergency Medicine

## 2022-12-15 DIAGNOSIS — N184 Chronic kidney disease, stage 4 (severe): Secondary | ICD-10-CM | POA: Diagnosis not present

## 2022-12-15 DIAGNOSIS — E039 Hypothyroidism, unspecified: Secondary | ICD-10-CM | POA: Diagnosis not present

## 2022-12-15 DIAGNOSIS — I129 Hypertensive chronic kidney disease with stage 1 through stage 4 chronic kidney disease, or unspecified chronic kidney disease: Secondary | ICD-10-CM | POA: Diagnosis not present

## 2022-12-15 DIAGNOSIS — M25562 Pain in left knee: Secondary | ICD-10-CM | POA: Insufficient documentation

## 2022-12-15 MED ORDER — LIDOCAINE 5 % EX PTCH
1.0000 | MEDICATED_PATCH | CUTANEOUS | Status: DC
  Administered 2022-12-15: 1 via TRANSDERMAL
  Filled 2022-12-15: qty 1

## 2022-12-15 NOTE — ED Provider Notes (Signed)
[facility] Provider Note    Event Date/Time   First MD Initiated Contact with Patient 12/15/22 1251     (approximate)   History   Knee Pain   HPI  Stephanie Anthony is a 89 y.o. female who presents today for evaluation of left knee pain.  She reports that this pain is been ongoing for quite some time but feels like it got worse over the last few days.  She denies any injuries or falls.  She reports that the pain is described as an aching pain.  She is still able to ambulate.  No fevers or chills.  She has not noticed any skin color changes.  Patient Active Problem List   Diagnosis Date Noted   BPPV (benign paroxysmal positional vertigo), left 02/05/2020   Near syncope 02/04/2020   Mass of appendix 06/21/2018   Anorexia 07/09/2015   Severe bipolar I disorder with depression (HCC) 11/16/2014   Enteritis 10/17/2014   Gastroenteritis, non-infectious 10/17/2014   Chronic kidney disease (CKD), stage III (moderate) (HCC) 09/27/2014   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   H/O nutritional disorder 09/27/2014   HLD (hyperlipidemia) 09/27/2014   Heart valve incompetence 09/27/2014   Mitral and aortic incompetence 09/27/2014   Personal history of other endocrine, nutritional and metabolic disease 78/29/5621   Hypothyroidism, postop 09/27/2014   Bipolar disorder, current episode depressed, severe, without psychotic features (HCC)    Bipolar affective disorder, current episode depressed (HCC) 09/25/2014   Bipolar I disorder, most recent episode depressed (HCC) 09/25/2014   Chronic kidney disease, stage IV (severe) (HCC) 09/19/2014   Pure hypercholesterolemia 09/19/2014   Acquired hypothyroidism 03/29/2014   Bipolar affective disorder (HCC) 03/29/2014   Bradycardia 03/29/2014   Essential (primary) hypertension 03/29/2014   MI (mitral incompetence) 03/29/2014   Acquired cyst of kidney 06/18/2012          Physical Exam   Triage  Vital Signs: ED Triage Vitals  Encounter Vitals Group     BP 12/15/22 1155 138/69     Systolic BP Percentile --      Diastolic BP Percentile --      Pulse Rate 12/15/22 1155 69     Resp 12/15/22 1155 17     Temp 12/15/22 1155 99 F (37.2 C)     Temp Source 12/15/22 1155 Oral     SpO2 12/15/22 1155 98 %     Weight 12/15/22 1159 176 lb 9.4 oz (80.1 kg)     Height 12/15/22 1159 5\' 7"  (1.702 m)     Head Circumference --      Peak Flow --      Pain Score 12/15/22 1159 10     Pain Loc --      Pain Education --      Exclude from Growth Chart --     Most recent vital signs: Vitals:   12/15/22 1155  BP: 138/69  Pulse: 69  Resp: 17  Temp: 99 F (37.2 C)  SpO2: 98%    Physical Exam Vitals and nursing note reviewed.  Constitutional:      General: Awake and alert. No acute distress.    Appearance: Normal appearance. The patient is normal weight.  HENT:     Head: Normocephalic and atraumatic.     Mouth: Mucous membranes are moist.  Eyes:     General: PERRL. Normal EOMs        Right eye: No discharge.        Left eye:  No discharge.     Conjunctiva/sclera: Conjunctivae normal.  Cardiovascular:     Rate and Rhythm: Normal rate and regular rhythm.     Pulses: Normal pulses.  Pulmonary:     Effort: Pulmonary effort is normal. No respiratory distress.     Breath sounds: Normal breath sounds.  Abdominal:     Abdomen is soft. There is no abdominal tenderness. No rebound or guarding. No distention. Musculoskeletal:        General: No swelling. Normal range of motion.     Cervical back: Normal range of motion and neck supple.  Left knee: No deformity or rash. Mild anterior, lateral, and medial joint line tenderness. No patellar tenderness, no ballotment Warm and well perfused extremity with 2+ pedal pulses 5/5 strength to dorsiflexion and plantarflexion at the ankle with intact sensation throughout extremity Normal range of motion of the knee, with intact flexion and extension to  active and passive range of motion. Extensor mechanism intact. No ligamentous laxity. Negative anterior/posterior drawer/negative lachman, negative mcmurrays No effusion or warmth Intact quadriceps, hamstring function, patellar tendon function Pelvis stable Full ROM of ankle without pain or swelling Foot warm and well perfused Skin:    General: Skin is warm and dry.     Capillary Refill: Capillary refill takes less than 2 seconds.     Findings: No rash.  Neurological:     Mental Status: The patient is awake and alert.      ED Results / Procedures / Treatments   Labs (all labs ordered are listed, but only abnormal results are displayed) Labs Reviewed - No data to display   EKG     RADIOLOGY I independently reviewed and interpreted imaging and agree with radiologists findings.     PROCEDURES:  Critical Care performed:   Procedures   MEDICATIONS ORDERED IN ED: Medications  lidocaine (LIDODERM) 5 % 1 patch (1 patch Transdermal Patch Applied 12/15/22 1344)     IMPRESSION / MDM / ASSESSMENT AND PLAN / ED COURSE  I reviewed the triage vital signs and the nursing notes.   Differential diagnosis includes, but is not limited to, osteoarthritis, effusion, sprain, contusion, dislocation, fracture, joint infection, tendon rupture. No evidence of neurological deficit or vascular compromise on exam. No fracture/dislocation on X-Ray. No deformity or obvious ligamentous laxity on exam.No constitutional symptoms or effusion to suggest septic joint. No history of immunosuppression. Overall well appearing, vital signs stable. No indication for diagnostic or therapeutic procedure such as arthrocentesis.  X-ray reveals degenerative changes, I suspect arthritis is the most likely etiology.  I instructed her to follow-up with orthopedics, she reports that her orthopedist is Dr. Ernest Pine and she is able to see him.  Return precautions and care instructions discussed.  She is ambulatory with a  steady gait.  Patient agrees with plan of care.   Patient's presentation is most consistent with acute complicated illness / injury requiring diagnostic workup.    FINAL CLINICAL IMPRESSION(S) / ED DIAGNOSES   Final diagnoses:  Acute pain of left knee     Rx / DC Orders   ED Discharge Orders     None        Note:  This document was prepared using Dragon voice recognition software and may include unintentional dictation errors.   Jackelyn Hoehn, PA-C 12/15/22 1357    Phineas Semen, MD 12/15/22 [phone]

## 2022-12-15 NOTE — ED Triage Notes (Signed)
Pt here with left knee pain since Sun. Pt denies fall or injury. Pt states whenever she puts pressure on it the pain is severe. Pt stable in triage.

## 2022-12-15 NOTE — Discharge Instructions (Signed)
Please follow-up with orthopedic surgery.  Your x-ray shows arthritis.  Please return for any new, worsening, or change in symptoms or other concerns.  It was a pleasure caring for you today.

## 2023-01-06 ENCOUNTER — Telehealth: Payer: Self-pay

## 2023-01-06 NOTE — Telephone Encounter (Signed)
son left message that his mother needed refill on her medication.

## 2023-01-06 NOTE — Telephone Encounter (Signed)
left message on pharmacy line that rx was sent 7-22 and had note not to fill until 8-20

## 2023-01-06 NOTE — Telephone Encounter (Signed)
pt son was called notified that pharmacy should have rx. on hold. prescription was sent on 7-22 @ 2:03 with a note not to fill until 8-20

## 2023-01-06 NOTE — Telephone Encounter (Signed)
pharmacy was already left a message that rx was on hold and to go ahead and fill that it was a note on the rx to fill on 8-20
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
## 2023-03-05 ENCOUNTER — Encounter: Payer: Self-pay | Admitting: Psychiatry

## 2023-03-05 ENCOUNTER — Ambulatory Visit (INDEPENDENT_AMBULATORY_CARE_PROVIDER_SITE_OTHER): Payer: Medicare Other | Admitting: Psychiatry

## 2023-03-05 VITALS — BP 137/73 | HR 80 | Temp 97.8°F | Ht 67.0 in | Wt 168.6 lb

## 2023-03-05 DIAGNOSIS — F3131 Bipolar disorder, current episode depressed, mild: Secondary | ICD-10-CM | POA: Diagnosis not present

## 2023-03-05 DIAGNOSIS — G20C Parkinsonism, unspecified: Secondary | ICD-10-CM

## 2023-03-05 NOTE — Patient Instructions (Signed)
Continue lamotrigine 100 mg daily  Next appointment: 1/13 at 11:30

## 2023-04-05 DIAGNOSIS — M1712 Unilateral primary osteoarthritis, left knee: Principal | ICD-10-CM | POA: Insufficient documentation

## 2023-04-09 ENCOUNTER — Telehealth: Payer: Self-pay

## 2023-04-09 ENCOUNTER — Other Ambulatory Visit: Payer: Self-pay | Admitting: Psychiatry

## 2023-04-09 MED ORDER — LAMOTRIGINE 100 MG PO TABS: 100.0000 mg | ORAL_TABLET | Freq: Every day | ORAL | 0 refills | Status: DC

## 2023-04-09 NOTE — Telephone Encounter (Signed)
Pt.notified

## 2023-04-09 NOTE — Telephone Encounter (Signed)
pt left message that she needs a refill on her lamotrigine
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
## 2023-06-01 ENCOUNTER — Encounter: Payer: Self-pay | Admitting: Psychiatry

## 2023-06-01 ENCOUNTER — Ambulatory Visit (INDEPENDENT_AMBULATORY_CARE_PROVIDER_SITE_OTHER): Payer: Medicare Other | Admitting: Psychiatry

## 2023-06-01 VITALS — BP 130/78 | HR 74 | Temp 98.1°F | Ht 67.0 in | Wt 169.0 lb

## 2023-06-01 DIAGNOSIS — F3131 Bipolar disorder, current episode depressed, mild: Secondary | ICD-10-CM

## 2023-06-01 MED ORDER — LAMOTRIGINE 100 MG PO TABS: 100.0000 mg | ORAL_TABLET | Freq: Every day | ORAL | 0 refills | Status: DC

## 2023-06-01 NOTE — Patient Instructions (Signed)
 Continue lamotrigine 100 mg daily  Next appointment: 4/14 at 11:30

## 2023-06-03 ENCOUNTER — Encounter
Admission: RE | Admit: 2023-06-03 | Discharge: 2023-06-03 | Disposition: A | Discharge status: Home / Self Care | Payer: Medicare Other | Source: Ambulatory Visit | Attending: Orthopedic Surgery | Admitting: Orthopedic Surgery

## 2023-06-03 ENCOUNTER — Other Ambulatory Visit: Payer: Self-pay

## 2023-06-03 VITALS — BP 147/60 | HR 67 | Temp 98.2°F | Resp 20

## 2023-06-03 DIAGNOSIS — Z01812 Encounter for preprocedural laboratory examination: Secondary | ICD-10-CM

## 2023-06-03 DIAGNOSIS — N184 Chronic kidney disease, stage 4 (severe): Secondary | ICD-10-CM

## 2023-06-03 DIAGNOSIS — M1712 Unilateral primary osteoarthritis, left knee: Secondary | ICD-10-CM | POA: Diagnosis not present

## 2023-06-03 LAB — URINALYSIS, ROUTINE W REFLEX MICROSCOPIC
Bacteria, UA: NONE SEEN
Bilirubin Urine: NEGATIVE
Glucose, UA: NEGATIVE mg/dL
Ketones, ur: NEGATIVE mg/dL
Leukocytes,Ua: NEGATIVE
Nitrite: NEGATIVE
Protein, ur: NEGATIVE mg/dL
RBC / HPF: 0 RBC/hpf (ref 0–5)
Specific Gravity, Urine: 1.003 — ABNORMAL LOW (ref 1.005–1.030)
pH: 6 (ref 5.0–8.0)

## 2023-06-03 LAB — SURGICAL PCR SCREEN
MRSA, PCR: NEGATIVE
Staphylococcus aureus: NEGATIVE

## 2023-06-03 LAB — C-REACTIVE PROTEIN: CRP: 0.6 mg/dL (ref <1.0)

## 2023-06-03 LAB — SEDIMENTATION RATE: Sed Rate: 8 mm/h (ref 0–30)

## 2023-06-03 NOTE — Patient Instructions (Addendum)
 Your procedure is scheduled on:  Wednesday, Jan 22/2025  Report to the Registration Desk on the 1st floor of the [facility]. To find out your arrival time, please call [phone] between 1PM - 3PM on: Tuesday, Jan 21/ 2025 If your arrival time is 6:00 am, do not arrive before that time as the [facility] entrance doors do not open until 6:00 am.  REMEMBER: Instructions that are not followed completely may result in serious medical risk, up to and including death; or upon the discretion of your surgeon and anesthesiologist your surgery may need to be rescheduled.  Do not eat food after midnight the night before surgery.  No gum chewing or hard candies.  You may however, drink CLEAR liquids up to 2 hours before you are scheduled to arrive for your surgery. Do not drink anything within 2 hours of your scheduled arrival time.  Clear liquids include: - water  - apple juice without pulp - gatorade (not RED colors) - black coffee or tea (Do NOT add milk or creamers to the coffee or tea) Do NOT drink anything that is not on this list.    In addition, your doctor has ordered for you to drink the provided:  Ensure Pre-Surgery Clear Carbohydrate Drink   Drinking this carbohydrate drink up to two hours before surgery helps to reduce insulin resistance and improve patient outcomes. Please complete drinking 2 hours before scheduled arrival time.  One week prior to surgery: Stop Anti-inflammatories (NSAIDS) such as Advil, Aleve, Ibuprofen, Motrin, Naproxen, Naprosyn and Aspirin  based products such as Excedrin, Goody's Powder, BC Powder. Stop ANY OVER THE COUNTER supplements until after surgery.  You may however, continue to take Tylenol  if needed for pain up until the day of surgery.     Continue taking all of your other prescription medications up until the day of surgery.  ON THE DAY OF SURGERY ONLY TAKE THESE MEDICATIONS WITH SIPS OF WATER:  lamoTRIgine  (LAMICTAL )  levothyroxine   (SYNTHROID ) lovastatin (MEVACOR)  No Alcohol for 24 hours before or after surgery.  No Smoking including e-cigarettes for 24 hours before surgery.  No chewable tobacco products for at least 6 hours before surgery.  No nicotine patches on the day of surgery.  Do not use any "recreational" drugs for at least a week (preferably 2 weeks) before your surgery.  Please be advised that the combination of cocaine and anesthesia may have negative outcomes, up to and including death. If you test positive for cocaine, your surgery will be cancelled.  On the morning of surgery brush your teeth with toothpaste and water, you may rinse your mouth with mouthwash if you wish. Do not swallow any toothpaste or mouthwash.  Use CHG Soap or wipes as directed on instruction sheet.  Do not wear jewelry, make-up, hairpins, clips or nail polish.  For welded (permanent) jewelry: bracelets, anklets, waist bands, etc.  Please have this removed prior to surgery.  If it is not removed, there is a chance that hospital personnel will need to cut it off on the day of surgery.  Do not wear lotions, powders, or perfumes.   Do not shave body hair from the neck down 48 hours before surgery.  Contact lenses, hearing aids and dentures may not be worn into surgery.  Do not bring valuables to the hospital. [facility] is not responsible for any missing/lost belongings or valuables.      Notify your doctor if there is any change in your medical condition (cold, fever,  infection).  Wear comfortable clothing (specific to your surgery type) to the hospital.  After surgery, you can help prevent lung complications by doing breathing exercises.  Take deep breaths and cough every 1-2 hours. Your doctor may order a device called an Incentive Spirometer to help you take deep breaths.  If you are being admitted to the hospital overnight, leave your suitcase in the car. After surgery it may be brought to your room.  If you are  being discharged the day of surgery, you will not be allowed to drive home. You will need a responsible individual to drive you home and stay with you for 24 hours after surgery.    Please call the Pre-admissions Testing Dept. at [phone] if you have any questions about these instructions.  Surgery Visitation Policy:  Patients having surgery or a procedure may have two visitors.  Children under the age of 15 must have an adult with them who is not the patient.  Temporary Visitor Restrictions Due to increasing cases of flu, RSV and COVID-19: Children ages 37 and under will not be able to visit patients in [facility] hospitals under most circumstances.  Inpatient Visitation:    Visiting hours are 7 a.m. to 8 p.m. Up to four visitors are allowed at one time in a patient room. The visitors may rotate out with other people during the day.  One visitor age 90 or older may stay with the patient overnight and must be in the room by 8 p.m.     Pre-operative 5 CHG Bath Instructions   You can play a key role in reducing the risk of infection after surgery. Your skin needs to be as free of germs as possible. You can reduce the number of germs on your skin by washing with CHG (chlorhexidine  gluconate) soap before surgery. CHG is an antiseptic soap that kills germs and continues to kill germs even after washing.   DO NOT use if you have an allergy to chlorhexidine /CHG or antibacterial soaps. If your skin becomes reddened or irritated, stop using the CHG and notify one of our RNs at [phone].   Please shower with the CHG soap starting 4 days before surgery using the following schedule:     Please keep in mind the following:  DO NOT shave, including legs and underarms, starting the day of your first shower.   You may shave your face at any point before/day of surgery.  Place clean sheets on your bed the day you start using CHG soap. Use a clean washcloth (not used since being washed)  for each shower. DO NOT sleep with pets once you start using the CHG.   CHG Shower Instructions:  If you choose to wash your hair and private area, wash first with your normal shampoo/soap.  After you use shampoo/soap, rinse your hair and body thoroughly to remove shampoo/soap residue.  Turn the water OFF and apply about 3 tablespoons (45 ml) of CHG soap to a CLEAN washcloth.  Apply CHG soap ONLY FROM YOUR NECK DOWN TO YOUR TOES (washing for 3-5 minutes)  DO NOT use CHG soap on face, private areas, open wounds, or sores.  Pay special attention to the area where your surgery is being performed.  If you are having back surgery, having someone wash your back for you may be helpful. Wait 2 minutes after CHG soap is applied, then you may rinse off the CHG soap.  Pat dry with a clean towel  Put on clean  clothes/pajamas   If you choose to wear lotion, please use ONLY the CHG-compatible lotions on the back of this paper.     Additional instructions for the day of surgery: DO NOT APPLY any lotions, deodorants, cologne, or perfumes.   Put on clean/comfortable clothes.  Brush your teeth.  Ask your nurse before applying any prescription medications to the skin.      CHG Compatible Lotions   Aveeno Moisturizing lotion  Cetaphil Moisturizing Cream  Cetaphil Moisturizing Lotion  Clairol Herbal Essence Moisturizing Lotion, Dry Skin  Clairol Herbal Essence Moisturizing Lotion, Extra Dry Skin  Clairol Herbal Essence Moisturizing Lotion, Normal Skin  Curel Age Defying Therapeutic Moisturizing Lotion with Alpha Hydroxy  Curel Extreme Care Body Lotion  Curel Soothing Hands Moisturizing Hand Lotion  Curel Therapeutic Moisturizing Cream, Fragrance-Free  Curel Therapeutic Moisturizing Lotion, Fragrance-Free  Curel Therapeutic Moisturizing Lotion, Original Formula  Eucerin Daily Replenishing Lotion  Eucerin Dry Skin Therapy Plus Alpha Hydroxy Crme  Eucerin Dry Skin Therapy Plus Alpha Hydroxy  Lotion  Eucerin Original Crme  Eucerin Original Lotion  Eucerin Plus Crme Eucerin Plus Lotion  Eucerin TriLipid Replenishing Lotion  Keri Anti-Bacterial Hand Lotion  Keri Deep Conditioning Original Lotion Dry Skin Formula Softly Scented  Keri Deep Conditioning Original Lotion, Fragrance Free Sensitive Skin Formula  Keri Lotion Fast Absorbing Fragrance Free Sensitive Skin Formula  Keri Lotion Fast Absorbing Softly Scented Dry Skin Formula  Keri Original Lotion  Keri Skin Renewal Lotion Keri Silky Smooth Lotion  Keri Silky Smooth Sensitive Skin Lotion  Nivea Body Creamy Conditioning Oil  Nivea Body Extra Enriched Teacher, adult education Moisturizing Lotion Nivea Crme  Nivea Skin Firming Lotion  NutraDerm 30 Skin Lotion  NutraDerm Skin Lotion  NutraDerm Therapeutic Skin Cream  NutraDerm Therapeutic Skin Lotion  ProShield Protective Hand Cream

## 2023-06-07 NOTE — H&P (Signed)
ORTHOPAEDIC HISTORY & PHYSICAL Stephanie Anthony, [place] - 06/02/2023 2:00 PM EST Formatting of this note is different from the original. NAME: Stephanie Anthony H&P Date: 06/03/2023 Procedure Date: 06/10/2023  Chief Complaint: left knee pain, swelling, and giving way  HPI Stephanie Anthony is a 89 y.o. female who has severe left knee pain. She has had left knee pain that has persisted for multiple years. She states that the pain mostly localizes along the front and medial aspect of her knee. She does report intermittent swelling and giving way of the knee. Denies any locking symptoms. She states that this pain is aggravated with any weightbearing or prolonged ambulation. It has begun to affect her ability to perform her ADLs and to ambulate long distances. She has failed conservative treatment including activity modification and viscosupplementation injections. She does not currently utilize any ambulatory aids . She has requested operative intervention for relief of her DJD symptoms. She denies any known cardiac or pulmonary history. She denies any clots or DVT history. Patient is not a diabetic. She does report that aspirin has caused rectal bleeds in the past. Consent has been authorized from her kidney doctor. Of note, she does have a history of a right total knee arthroplasty that was also performed by Dr. Ernest Anthony 13 years ago.  Social Hx: Patient is retired. She states that she lives at home alone, but states that she has a son that will be coming up from [place] to help look after her after her surgery. She denies any alcohol, illicit drug or tobacco/nicotine use or smoking.  Medications & Allergies Allergies: Allergies Allergen Reactions Aspirin Other (See Comments) Rectal bleeding.  Morphine Other (See Comments) Altered mental status Codeine Nausea And Vomiting Diazepam Other (See Comments) Strange feelings Olanzapine Unknown Prednisone Hallucination  Home Medicines: Current  Outpatient Medications on File Prior to Visit Medication Sig Dispense Refill lamoTRIgine (LAMICTAL) 100 MG tablet Take 1 tablet (100 mg total) by mouth at bedtime 30 tablet 1 latanoprost (XALATAN) 0.005 % ophthalmic solution INSTILL 1 DROP IN BOTH EYES EVERY EVENING levothyroxine (SYNTHROID) 100 MCG tablet Take 1 tablet (100 mcg total) by mouth every morning before breakfast (0630) 30 TO 60 MINUTES BEFORE BREAKFAST ON AN EMPTY STOMACH AND WITH A GLASS OF WATER 100 tablet 1 losartan (COZAAR) 25 MG tablet TAKE 1 TABLET BY MOUTH ONCE DAILY 90 tablet 1 lovastatin (MEVACOR) 40 MG tablet Take 1 tablet (40 mg total) by mouth at bedtime 100 tablet 1 pantoprazole (PROTONIX) 40 MG DR tablet TAKE 1 TABLET(40 MG) BY MOUTH DAILY 100 tablet 1 triamcinolone 0.5 % cream Apply topically 2 (two) times daily as needed (Patient not taking: Reported on 06/02/2023)  No current facility-administered medications on file prior to visit.  Medical / Surgical History  Past Medical History: Diagnosis Date Acquired hypothyroidism Bipolar disorder (CMS/HHS-HCC) type I, followed by Dr. Imogene Anthony Cataracts, bilateral Colon polyp Environmental allergies smoke, dust, dog dander Erosive esophagitis 10/20/2014 Erosive gastritis 10/20/2014 Esophageal stricture 11/13/2008 Dr. Elenore Anthony Essential hypertension Fibroid tumor Mitral valve insufficiency and aortic valve insufficiency 09/27/2014 Osteoarthritis Pure hypercholesterolemia Stage 4 chronic kidney disease (Cr 1.9, GFR 25 - 02/02/23) - followed by Dr. Cherylann Anthony 09/19/2014   Past Surgical History: Procedure Laterality Date THYROIDECTOMY TOTAL 1977 Right total knee arthroplasty 09/30/2006 Dr. Ernest Anthony ESOPHAGEAL DILATION 11/13/2008 [facility] 01/28/2010 Dr. Renae Fickle Anthony EGD 10/20/2014 Erosive esophagitis/Erosive gastritis/Repeat 7 weeks/MUS COLONOSCOPY 06/01/2018 Tubular adenoma of the colon/No Repeat/MUS EGD 06/01/2018 Gastritis/Duodenitis/No Repeat/MUS APPENDECTOMY  06/21/2018 Dr Arrie Senate BACK SURGERY For  Infection in the back many years ago CHOLECYSTECTOMY COLONOSCOPY 01/03/2003, 04/07/2006, 01/28/2010, 01/20/2013 Dr. Lutricia Feil Nose surgery deviated septum repair   Physical Exam  Ht:170.2 cm (5\' 7" ) Wt:77.3 kg (170 lb 6.4 oz) BMI: Body mass index is 26.69 kg/m.  General/Constitutional: No apparent distress: well-nourished and well developed. Eyes: Pupils equal, round with synchronous movement. Lymphatic: No palpable adenopathy. Respiratory: Patient has good chest rise and fall with inspiration and expiration. All lung fields are clear to auscultation bilaterally. There is no Rales, rhonchi or wheezes appreciated. Cardiovascular: Upon auscultation there is a regular rate and rhythm without any murmurs, rubs, gallops or heaves appreciated. There does not appear to be any swelling down the lower extremities. Posterior tibial pulses appreciated bilaterally, 2+. Integumentary: No impressive skin lesions present, except as noted in detailed exam. Neuro/Psych: Normal mood and affect, oriented to person, place and time. Musculoskeletal: see exam below  Left knee exam Upon inspection of the patient's left knee there does not appear to be any skin changes, open abrasions, swelling or redness. There is a neutral alignment. Upon palpation, the patient reports having pain along the anterior and medial aspect of their knee. Patient has about 6 degrees off of full extension actively with ROM, and able to flex back to 94 degrees with mild pain. Varus and valgus stress testing shows positive pseudolaxity with valgus stressing. The patella tracks well within the femoral groove from flexion into extension with some crepitus and grinding appreciated. Anterior and posterior drawer testing negative. Patient is neurovascularly intact down their lower extremity to all dermatomes. Posterior tibial pulses appreciated 2+.  Imaging left Knee Imaging: A series of x-rays  were ordered and interpreted of the patient's left knee. These images included AP weightbearing, lateral and sunrise views. Upon inspection of the AP views, there is noticeable loss of joint cartilage space along the medial aspect with near bone-on-bone interaction. There are subchondral changes noted and small osteophytes appreciated as well. There does appear to be some mild soft tissue swelling appreciated. Sunrise views show narrowed patellofemoral cartilage space and components on the patient's right knee consistent with a right total knee arthroplasty. No fractures, lytic lesions or gross deformities appreciated on films.  Assesment and Plan Knee DJD  I have recommended that Stephanie Anthony undergo left total knee replacement. Consents has been signed. The risks, benefits, prognosis and alternatives including but not limited to DVT, PE, infection, neurovascular injury, failure of the procedure and death were explained to the patient and she is willing to proceed with surgery as described to her by myself. Plan will be for post operative admission of at least 1 midnight for pain control and PT. She will be managed with DVT prophylaxis, antibiotics preoperatively for 24 hours and aggressive in patient rehab.  Pre, intra and post op interventions were discussed. Patient has good understanding  Medication Reconciliation was performed. Discussed cessation of vitamins and supplements.  A total of 50 minutes was spent reviewing patient's charts, medical reconciliation, discussing/educating the patient about surgical interventions, and answering any questions provided by the patient.  JOSHUA Kendrick Fries, PA [facility] 06/02/2023  Electronically signed by Stephanie Rover, PA at 06/03/2023 11:11 AM EST

## 2023-06-10 ENCOUNTER — Encounter: Payer: Self-pay | Admitting: Orthopedic Surgery

## 2023-06-10 ENCOUNTER — Ambulatory Visit: Payer: Medicare Other | Admitting: Urgent Care

## 2023-06-10 ENCOUNTER — Other Ambulatory Visit: Payer: Self-pay

## 2023-06-10 ENCOUNTER — Observation Stay: Payer: Medicare Other

## 2023-06-10 ENCOUNTER — Ambulatory Visit: Payer: Medicare Other | Admitting: Anesthesiology

## 2023-06-10 ENCOUNTER — Encounter
Admission: RE | Disposition: A | Discharge status: Home / Self Care | Payer: Self-pay | Source: Home / Self Care | Attending: Orthopedic Surgery

## 2023-06-10 ENCOUNTER — Observation Stay
Admission: RE | Admit: 2023-06-10 | Discharge: 2023-06-12 | Disposition: A | Discharge status: Home / Self Care | Payer: Medicare Other | Attending: Orthopedic Surgery | Admitting: Orthopedic Surgery

## 2023-06-10 DIAGNOSIS — H8112 Benign paroxysmal vertigo, left ear: Secondary | ICD-10-CM

## 2023-06-10 DIAGNOSIS — M1712 Unilateral primary osteoarthritis, left knee: Secondary | ICD-10-CM | POA: Diagnosis present

## 2023-06-10 DIAGNOSIS — Z96651 Presence of right artificial knee joint: Secondary | ICD-10-CM | POA: Insufficient documentation

## 2023-06-10 DIAGNOSIS — E039 Hypothyroidism, unspecified: Secondary | ICD-10-CM | POA: Insufficient documentation

## 2023-06-10 DIAGNOSIS — Z79899 Other long term (current) drug therapy: Secondary | ICD-10-CM | POA: Insufficient documentation

## 2023-06-10 DIAGNOSIS — N184 Chronic kidney disease, stage 4 (severe): Secondary | ICD-10-CM | POA: Insufficient documentation

## 2023-06-10 DIAGNOSIS — K222 Esophageal obstruction: Secondary | ICD-10-CM | POA: Diagnosis not present

## 2023-06-10 DIAGNOSIS — R630 Anorexia: Secondary | ICD-10-CM

## 2023-06-10 DIAGNOSIS — I129 Hypertensive chronic kidney disease with stage 1 through stage 4 chronic kidney disease, or unspecified chronic kidney disease: Secondary | ICD-10-CM | POA: Diagnosis not present

## 2023-06-10 DIAGNOSIS — Z96652 Presence of left artificial knee joint: Secondary | ICD-10-CM

## 2023-06-10 HISTORY — PX: KNEE ARTHROPLASTY: SHX992

## 2023-06-10 SURGERY — ARTHROPLASTY, KNEE, TOTAL, USING IMAGELESS COMPUTER-ASSISTED NAVIGATION
Anesthesia: Spinal | Site: Knee | Laterality: Left

## 2023-06-10 MED ORDER — GABAPENTIN 300 MG PO CAPS
300.0000 mg | ORAL_CAPSULE | Freq: Once | ORAL | Status: AC
  Administered 2023-06-10: 300 mg via ORAL

## 2023-06-10 MED ORDER — METOCLOPRAMIDE HCL 10 MG PO TABS
ORAL_TABLET | ORAL | Status: AC
  Filled 2023-06-10: qty 1

## 2023-06-10 MED ORDER — PROPOFOL 10 MG/ML IV BOLUS
INTRAVENOUS | Status: DC | PRN
  Administered 2023-06-10: 20 mg via INTRAVENOUS

## 2023-06-10 MED ORDER — LEVOTHYROXINE SODIUM 100 MCG PO TABS
100.0000 ug | ORAL_TABLET | Freq: Every day | ORAL | Status: DC
  Administered 2023-06-11 – 2023-06-12 (×2): 100 ug via ORAL
  Filled 2023-06-10: qty 4
  Filled 2023-06-10 (×2): qty 1

## 2023-06-10 MED ORDER — CHLORHEXIDINE GLUCONATE 0.12 % MT SOLN
OROMUCOSAL | Status: AC
  Filled 2023-06-10: qty 15

## 2023-06-10 MED ORDER — OXYCODONE HCL 5 MG PO TABS
ORAL_TABLET | ORAL | Status: AC
  Filled 2023-06-10: qty 1

## 2023-06-10 MED ORDER — CHLORHEXIDINE GLUCONATE 0.12 % MT SOLN
15.0000 mL | Freq: Once | OROMUCOSAL | Status: AC
  Administered 2023-06-10: 15 mL via OROMUCOSAL

## 2023-06-10 MED ORDER — ONDANSETRON HCL 4 MG/2ML IJ SOLN: 4.0000 mg | Freq: Four times a day (QID) | INTRAMUSCULAR | Status: DC | PRN

## 2023-06-10 MED ORDER — FERROUS SULFATE 325 (65 FE) MG PO TABS
ORAL_TABLET | ORAL | Status: AC
  Filled 2023-06-10: qty 1

## 2023-06-10 MED ORDER — METOCLOPRAMIDE HCL 10 MG PO TABS
10.0000 mg | ORAL_TABLET | Freq: Three times a day (TID) | ORAL | Status: DC
  Administered 2023-06-10 – 2023-06-12 (×7): 10 mg via ORAL

## 2023-06-10 MED ORDER — SURGIPHOR WOUND IRRIGATION SYSTEM - OPTIME
TOPICAL | Status: DC | PRN
  Administered 2023-06-10: 450 mL

## 2023-06-10 MED ORDER — LOSARTAN POTASSIUM 50 MG PO TABS
25.0000 mg | ORAL_TABLET | Freq: Every day | ORAL | Status: DC
  Administered 2023-06-12: 25 mg via ORAL

## 2023-06-10 MED ORDER — SODIUM CHLORIDE 0.9 % IR SOLN
Status: DC | PRN
  Administered 2023-06-10: 3000 mL

## 2023-06-10 MED ORDER — ORAL CARE MOUTH RINSE: 15.0000 mL | Freq: Once | OROMUCOSAL | Status: AC

## 2023-06-10 MED ORDER — OXYCODONE HCL 5 MG/5ML PO SOLN: 5.0000 mg | Freq: Once | ORAL | Status: AC | PRN

## 2023-06-10 MED ORDER — ALUM & MAG HYDROXIDE-SIMETH 200-200-20 MG/5ML PO SUSP: 30.0000 mL | ORAL | Status: DC | PRN

## 2023-06-10 MED ORDER — BUPIVACAINE LIPOSOME 1.3 % IJ SUSP
INTRAMUSCULAR | Status: AC
  Filled 2023-06-10: qty 40

## 2023-06-10 MED ORDER — OXYCODONE HCL 5 MG PO TABS
5.0000 mg | ORAL_TABLET | ORAL | Status: DC | PRN
  Administered 2023-06-10 – 2023-06-11 (×4): 5 mg via ORAL

## 2023-06-10 MED ORDER — OXYCODONE HCL 5 MG PO TABS
10.0000 mg | ORAL_TABLET | ORAL | Status: DC | PRN
  Administered 2023-06-12 (×2): 10 mg via ORAL

## 2023-06-10 MED ORDER — PRAVASTATIN SODIUM 20 MG PO TABS
40.0000 mg | ORAL_TABLET | Freq: Every day | ORAL | Status: DC
Start: 2023-06-11 — End: 2023-06-12
  Administered 2023-06-11: 40 mg via ORAL

## 2023-06-10 MED ORDER — ACETAMINOPHEN 325 MG PO TABS
325.0000 mg | ORAL_TABLET | Freq: Four times a day (QID) | ORAL | Status: DC | PRN
  Administered 2023-06-11: 325 mg via ORAL
  Administered 2023-06-12: 650 mg via ORAL

## 2023-06-10 MED ORDER — CHLORHEXIDINE GLUCONATE 4 % EX SOLN
60.0000 mL | Freq: Once | CUTANEOUS | Status: AC
  Administered 2023-06-10: 4 via TOPICAL

## 2023-06-10 MED ORDER — BUPIVACAINE HCL (PF) 0.5 % IJ SOLN
INTRAMUSCULAR | Status: AC
  Filled 2023-06-10: qty 10

## 2023-06-10 MED ORDER — CEFAZOLIN SODIUM-DEXTROSE 2-4 GM/100ML-% IV SOLN
2.0000 g | INTRAVENOUS | Status: AC
  Administered 2023-06-10: 2 g via INTRAVENOUS

## 2023-06-10 MED ORDER — DEXAMETHASONE SODIUM PHOSPHATE 10 MG/ML IJ SOLN
8.0000 mg | Freq: Once | INTRAMUSCULAR | Status: AC
  Administered 2023-06-10: 8 mg via INTRAVENOUS

## 2023-06-10 MED ORDER — ACETAMINOPHEN 10 MG/ML IV SOLN
INTRAVENOUS | Status: AC
  Filled 2023-06-10: qty 100

## 2023-06-10 MED ORDER — ONDANSETRON HCL 4 MG PO TABS
4.0000 mg | ORAL_TABLET | Freq: Four times a day (QID) | ORAL | Status: DC | PRN
Start: 2023-06-10 — End: 2023-06-12
  Administered 2023-06-11 – 2023-06-12 (×2): 4 mg via ORAL

## 2023-06-10 MED ORDER — ACETAMINOPHEN 10 MG/ML IV SOLN
1000.0000 mg | Freq: Four times a day (QID) | INTRAVENOUS | Status: AC
  Administered 2023-06-10 – 2023-06-11 (×4): 1000 mg via INTRAVENOUS

## 2023-06-10 MED ORDER — MENTHOL 3 MG MT LOZG
1.0000 | LOZENGE | OROMUCOSAL | Status: DC | PRN
Start: 2023-06-10 — End: 2023-06-12

## 2023-06-10 MED ORDER — ENSURE PRE-SURGERY PO LIQD
296.0000 mL | Freq: Once | ORAL | Status: AC
  Administered 2023-06-10: 296 mL via ORAL
  Filled 2023-06-10: qty 296

## 2023-06-10 MED ORDER — SODIUM CHLORIDE FLUSH 0.9 % IV SOLN
INTRAVENOUS | Status: AC
  Filled 2023-06-10: qty 80

## 2023-06-10 MED ORDER — TRAMADOL HCL 50 MG PO TABS
50.0000 mg | ORAL_TABLET | ORAL | Status: DC | PRN
  Administered 2023-06-11 – 2023-06-12 (×2): 50 mg via ORAL

## 2023-06-10 MED ORDER — LIDOCAINE HCL (CARDIAC) PF 100 MG/5ML IV SOSY
PREFILLED_SYRINGE | INTRAVENOUS | Status: DC | PRN
  Administered 2023-06-10: 30 mg via INTRAVENOUS

## 2023-06-10 MED ORDER — TRANEXAMIC ACID-NACL 1000-0.7 MG/100ML-% IV SOLN
1000.0000 mg | INTRAVENOUS | Status: AC
  Administered 2023-06-10: 1000 mg via INTRAVENOUS

## 2023-06-10 MED ORDER — FENTANYL CITRATE (PF) 100 MCG/2ML IJ SOLN
INTRAMUSCULAR | Status: AC
  Filled 2023-06-10: qty 2

## 2023-06-10 MED ORDER — PHENYLEPHRINE 80 MCG/ML (10ML) SYRINGE FOR IV PUSH (FOR BLOOD PRESSURE SUPPORT)
PREFILLED_SYRINGE | INTRAVENOUS | Status: DC | PRN
  Administered 2023-06-10: 80 ug via INTRAVENOUS

## 2023-06-10 MED ORDER — CEFAZOLIN SODIUM-DEXTROSE 2-4 GM/100ML-% IV SOLN
INTRAVENOUS | Status: AC
  Filled 2023-06-10: qty 100

## 2023-06-10 MED ORDER — TRANEXAMIC ACID-NACL 1000-0.7 MG/100ML-% IV SOLN
1000.0000 mg | Freq: Once | INTRAVENOUS | Status: AC
  Administered 2023-06-10: 1000 mg via INTRAVENOUS

## 2023-06-10 MED ORDER — BUPIVACAINE HCL (PF) 0.25 % IJ SOLN
INTRAMUSCULAR | Status: AC
  Filled 2023-06-10: qty 120

## 2023-06-10 MED ORDER — LATANOPROST 0.005 % OP SOLN
1.0000 [drp] | Freq: Every day | OPHTHALMIC | Status: DC
  Administered 2023-06-10 – 2023-06-11 (×2): 1 [drp] via OPHTHALMIC
  Filled 2023-06-10 (×2): qty 2.5

## 2023-06-10 MED ORDER — PROPOFOL 500 MG/50ML IV EMUL
INTRAVENOUS | Status: DC | PRN
  Administered 2023-06-10: 70 ug/kg/min via INTRAVENOUS

## 2023-06-10 MED ORDER — EPHEDRINE SULFATE-NACL 50-0.9 MG/10ML-% IV SOSY
PREFILLED_SYRINGE | INTRAVENOUS | Status: DC | PRN
  Administered 2023-06-10 (×2): 5 mg via INTRAVENOUS

## 2023-06-10 MED ORDER — SODIUM CHLORIDE (PF) 0.9 % IJ SOLN
INTRAMUSCULAR | Status: DC | PRN
  Administered 2023-06-10: 120 mL

## 2023-06-10 MED ORDER — SODIUM CHLORIDE 0.9 % IV SOLN: INTRAVENOUS | Status: DC | PRN

## 2023-06-10 MED ORDER — ONDANSETRON HCL 4 MG/2ML IJ SOLN
INTRAMUSCULAR | Status: DC | PRN
  Administered 2023-06-10: 4 mg via INTRAVENOUS

## 2023-06-10 MED ORDER — PANTOPRAZOLE SODIUM 40 MG PO TBEC
40.0000 mg | DELAYED_RELEASE_TABLET | Freq: Two times a day (BID) | ORAL | Status: DC
  Administered 2023-06-10 – 2023-06-12 (×4): 40 mg via ORAL

## 2023-06-10 MED ORDER — SENNOSIDES-DOCUSATE SODIUM 8.6-50 MG PO TABS
1.0000 | ORAL_TABLET | Freq: Two times a day (BID) | ORAL | Status: DC
  Administered 2023-06-10 – 2023-06-12 (×4): 1 via ORAL

## 2023-06-10 MED ORDER — ORAL CARE MOUTH RINSE: 15.0000 mL | OROMUCOSAL | Status: DC | PRN

## 2023-06-10 MED ORDER — ACETAMINOPHEN 10 MG/ML IV SOLN
INTRAVENOUS | Status: DC | PRN
  Administered 2023-06-10: 1000 mg via INTRAVENOUS

## 2023-06-10 MED ORDER — ASPIRIN 81 MG PO CHEW
81.0000 mg | CHEWABLE_TABLET | Freq: Two times a day (BID) | ORAL | Status: DC
  Administered 2023-06-10 – 2023-06-12 (×4): 81 mg via ORAL

## 2023-06-10 MED ORDER — SENNOSIDES-DOCUSATE SODIUM 8.6-50 MG PO TABS
ORAL_TABLET | ORAL | Status: AC
Start: 2023-06-10 — End: ?
  Filled 2023-06-10: qty 1

## 2023-06-10 MED ORDER — PHENYLEPHRINE HCL-NACL 20-0.9 MG/250ML-% IV SOLN
INTRAVENOUS | Status: AC
  Filled 2023-06-10: qty 250

## 2023-06-10 MED ORDER — PHENOL 1.4 % MT LIQD: 1.0000 | OROMUCOSAL | Status: DC | PRN

## 2023-06-10 MED ORDER — DEXAMETHASONE SODIUM PHOSPHATE 10 MG/ML IJ SOLN
INTRAMUSCULAR | Status: AC
  Filled 2023-06-10: qty 1

## 2023-06-10 MED ORDER — PROPOFOL 1000 MG/100ML IV EMUL
INTRAVENOUS | Status: AC
  Filled 2023-06-10: qty 100

## 2023-06-10 MED ORDER — MAGNESIUM HYDROXIDE 400 MG/5ML PO SUSP
30.0000 mL | Freq: Every day | ORAL | Status: DC
  Administered 2023-06-11 – 2023-06-12 (×2): 30 mL via ORAL

## 2023-06-10 MED ORDER — LAMOTRIGINE 25 MG PO TABS
100.0000 mg | ORAL_TABLET | Freq: Every day | ORAL | Status: DC
Start: 2023-06-11 — End: 2023-06-12
  Administered 2023-06-11: 100 mg via ORAL

## 2023-06-10 MED ORDER — OXYCODONE HCL 5 MG PO TABS
5.0000 mg | ORAL_TABLET | Freq: Once | ORAL | Status: AC | PRN
  Administered 2023-06-10: 5 mg via ORAL

## 2023-06-10 MED ORDER — TRANEXAMIC ACID-NACL 1000-0.7 MG/100ML-% IV SOLN
INTRAVENOUS | Status: AC
  Filled 2023-06-10: qty 100

## 2023-06-10 MED ORDER — FERROUS SULFATE 325 (65 FE) MG PO TABS
325.0000 mg | ORAL_TABLET | Freq: Two times a day (BID) | ORAL | Status: DC
  Administered 2023-06-10 – 2023-06-12 (×4): 325 mg via ORAL

## 2023-06-10 MED ORDER — HYDROMORPHONE HCL 1 MG/ML IJ SOLN: 0.5000 mg | INTRAMUSCULAR | Status: DC | PRN

## 2023-06-10 MED ORDER — DIPHENHYDRAMINE HCL 12.5 MG/5ML PO ELIX: 12.5000 mg | ORAL_SOLUTION | ORAL | Status: DC | PRN

## 2023-06-10 MED ORDER — PANTOPRAZOLE SODIUM 40 MG PO TBEC
DELAYED_RELEASE_TABLET | ORAL | Status: AC
  Filled 2023-06-10: qty 1

## 2023-06-10 MED ORDER — PHENYLEPHRINE HCL-NACL 20-0.9 MG/250ML-% IV SOLN
INTRAVENOUS | Status: DC | PRN
  Administered 2023-06-10: 40 ug/min via INTRAVENOUS

## 2023-06-10 MED ORDER — EPHEDRINE SULFATE (PRESSORS) 50 MG/ML IJ SOLN
INTRAMUSCULAR | Status: AC
  Filled 2023-06-10: qty 1

## 2023-06-10 MED ORDER — FENTANYL CITRATE (PF) 100 MCG/2ML IJ SOLN
25.0000 ug | INTRAMUSCULAR | Status: AC | PRN
  Administered 2023-06-10 (×6): 25 ug via INTRAVENOUS

## 2023-06-10 MED ORDER — CEFAZOLIN SODIUM-DEXTROSE 2-4 GM/100ML-% IV SOLN
INTRAVENOUS | Status: AC
Start: 2023-06-10 — End: ?
  Filled 2023-06-10: qty 100

## 2023-06-10 MED ORDER — CEFAZOLIN SODIUM-DEXTROSE 2-4 GM/100ML-% IV SOLN
2.0000 g | Freq: Four times a day (QID) | INTRAVENOUS | Status: AC
  Administered 2023-06-10 (×2): 2 g via INTRAVENOUS
  Filled 2023-06-10: qty 100

## 2023-06-10 MED ORDER — ASPIRIN 81 MG PO CHEW
CHEWABLE_TABLET | ORAL | Status: AC
Start: 2023-06-10 — End: ?
  Filled 2023-06-10: qty 1

## 2023-06-10 MED ORDER — BUPIVACAINE HCL (PF) 0.5 % IJ SOLN
INTRAMUSCULAR | Status: DC | PRN
  Administered 2023-06-10: 3 mL

## 2023-06-10 MED ORDER — FLEET ENEMA RE ENEM: 1.0000 | ENEMA | Freq: Once | RECTAL | Status: DC | PRN

## 2023-06-10 MED ORDER — GABAPENTIN 300 MG PO CAPS
ORAL_CAPSULE | ORAL | Status: AC
  Filled 2023-06-10: qty 1

## 2023-06-10 MED ORDER — SODIUM CHLORIDE 0.9 % IV SOLN: INTRAVENOUS | Status: DC

## 2023-06-10 MED ORDER — BISACODYL 10 MG RE SUPP: 10.0000 mg | Freq: Every day | RECTAL | Status: DC | PRN

## 2023-06-10 SURGICAL SUPPLY — 65 items
ATTUNE PSFEM LTSZ6 NARCEM KNEE (Femur) IMPLANT
ATTUNE PSRP INSR SZ6 5 KNEE (Insert) IMPLANT
BASE TIBIAL ROT PLAT SZ 5 KNEE (Knees) IMPLANT
BATTERY INSTRU NAVIGATION (MISCELLANEOUS) ×4 IMPLANT
BIT DRILL QUICK REL 1/8 2PK SL (BIT) ×1 IMPLANT
BLADE CLIPPER SURG (BLADE) IMPLANT
BLADE SAW 70X12.5 (BLADE) ×1 IMPLANT
BLADE SAW 90X13X1.19 OSCILLAT (BLADE) ×1 IMPLANT
BLADE SAW 90X25X1.19 OSCILLAT (BLADE) ×1 IMPLANT
BONE CEMENT GENTAMICIN (Cement) ×2 IMPLANT
BRUSH SCRUB EZ PLAIN DRY (MISCELLANEOUS) ×1 IMPLANT
CEMENT BONE GENTAMICIN 40 (Cement) IMPLANT
COOLER POLAR GLACIER W/PUMP (MISCELLANEOUS) ×1 IMPLANT
CUFF TRNQT CYL 24X4X16.5-23 (TOURNIQUET CUFF) IMPLANT
DRAPE SHEET LG 3/4 BI-LAMINATE (DRAPES) ×1 IMPLANT
DRSG AQUACEL AG ADV 3.5X14 (GAUZE/BANDAGES/DRESSINGS) ×1 IMPLANT
DRSG MEPILEX SACRM 8.7X9.8 (GAUZE/BANDAGES/DRESSINGS) ×1 IMPLANT
DRSG TEGADERM 4X4.75 (GAUZE/BANDAGES/DRESSINGS) ×1 IMPLANT
DURAPREP 26ML APPLICATOR (WOUND CARE) ×2 IMPLANT
ELECT CAUTERY BLADE 6.4 (BLADE) ×1 IMPLANT
ELECT REM PT RETURN 9FT ADLT (ELECTROSURGICAL) ×1
ELECTRODE REM PT RTRN 9FT ADLT (ELECTROSURGICAL) ×1 IMPLANT
EVACUATOR 1/8 PVC DRAIN (DRAIN) ×1 IMPLANT
EX-PIN ORTHOLOCK NAV 4X150 (PIN) ×2 IMPLANT
GAUZE XEROFORM 1X8 LF (GAUZE/BANDAGES/DRESSINGS) ×1 IMPLANT
GLOVE BIOGEL M STRL SZ7.5 (GLOVE) ×6 IMPLANT
GLOVE SURG UNDER POLY LF SZ8 (GLOVE) ×2 IMPLANT
GOWN STRL REUS W/ TWL LRG LVL3 (GOWN DISPOSABLE) ×1 IMPLANT
GOWN STRL REUS W/ TWL XL LVL3 (GOWN DISPOSABLE) ×1 IMPLANT
GOWN TOGA ZIPPER T7+ PEEL AWAY (MISCELLANEOUS) ×1 IMPLANT
HOLDER FOLEY CATH W/STRAP (MISCELLANEOUS) ×1 IMPLANT
HOOD PEEL AWAY T7 (MISCELLANEOUS) ×1 IMPLANT
IV NS IRRIG 3000ML ARTHROMATIC (IV SOLUTION) ×1 IMPLANT
KIT TURNOVER KIT A (KITS) ×1 IMPLANT
KNIFE SCULPS 14X20 (INSTRUMENTS) ×1 IMPLANT
MANIFOLD NEPTUNE II (INSTRUMENTS) ×2 IMPLANT
NDL SPNL 20GX3.5 QUINCKE YW (NEEDLE) ×2 IMPLANT
NEEDLE SPNL 20GX3.5 QUINCKE YW (NEEDLE) ×2
PACK TOTAL KNEE (MISCELLANEOUS) ×1 IMPLANT
PAD ABD DERMACEA PRESS 5X9 (GAUZE/BANDAGES/DRESSINGS) ×2 IMPLANT
PAD ARMBOARD 7.5X6 YLW CONV (MISCELLANEOUS) ×3 IMPLANT
PAD WRAPON POLAR KNEE (MISCELLANEOUS) ×1 IMPLANT
PATELLA MEDIAL ATTUN 35MM KNEE (Knees) IMPLANT
PENCIL SMOKE EVACUATOR COATED (MISCELLANEOUS) ×1 IMPLANT
PIN DRILL FIX HALF THREAD (BIT) ×2 IMPLANT
PIN FIXATION 1/8DIA X 3INL (PIN) ×1 IMPLANT
PULSAVAC PLUS IRRIG FAN TIP (DISPOSABLE) ×1
SOLUTION IRRIG SURGIPHOR (IV SOLUTION) ×1 IMPLANT
SPONGE DRAIN TRACH 4X4 STRL 2S (GAUZE/BANDAGES/DRESSINGS) ×1 IMPLANT
STAPLER SKIN PROX 35W (STAPLE) ×1 IMPLANT
STOCKINETTE STRL BIAS CUT 8X4 (MISCELLANEOUS) ×1 IMPLANT
STRAP TIBIA SHORT (MISCELLANEOUS) ×1 IMPLANT
SUCTION TUBE FRAZIER 10FR DISP (SUCTIONS) ×1 IMPLANT
SUT VIC AB 0 CT1 36 (SUTURE) ×1 IMPLANT
SUT VIC AB 1 CT1 36 (SUTURE) ×2 IMPLANT
SUT VIC AB 2-0 CT2 27 (SUTURE) ×1 IMPLANT
SYR 30ML LL (SYRINGE) ×2 IMPLANT
TIBIAL BASE ROT PLAT SZ 5 KNEE (Knees) ×1 IMPLANT
TIP FAN IRRIG PULSAVAC PLUS (DISPOSABLE) ×1 IMPLANT
TOWEL OR 17X26 4PK STRL BLUE (TOWEL DISPOSABLE) ×1 IMPLANT
TOWER CARTRIDGE SMART MIX (DISPOSABLE) ×1 IMPLANT
TRAP FLUID SMOKE EVACUATOR (MISCELLANEOUS) ×1 IMPLANT
TRAY FOLEY MTR SLVR 16FR STAT (SET/KITS/TRAYS/PACK) ×1 IMPLANT
WATER STERILE IRR 1000ML POUR (IV SOLUTION) ×1 IMPLANT
WRAPON POLAR PAD KNEE (MISCELLANEOUS) ×1

## 2023-06-10 NOTE — Progress Notes (Signed)
Patient is not able to walk the distance required to go the bathroom, or he/she is unable to safely negotiate stairs required to access the bathroom.  A 3in1 BSC will alleviate this problem   Liliann File P. Brighton Pilley, Jr. M.D.  

## 2023-06-10 NOTE — Anesthesia Procedure Notes (Signed)
Spinal  Patient location during procedure: OR Start time: 06/10/2023 7:27 AM End time: 06/10/2023 7:29 AM Reason for block: surgical anesthesia Staffing Performed: resident/CRNA  Anesthesiologist: Louie Boston, MD Resident/CRNA: Stormy Fabian, CRNA Performed by: Stormy Fabian, CRNA Authorized by: Louie Boston, MD   Preanesthetic Checklist Completed: patient identified, IV checked, site marked, risks and benefits discussed, surgical consent, monitors and equipment checked, pre-op evaluation and timeout performed Spinal Block Patient position: sitting Prep: ChloraPrep Patient monitoring: heart rate, continuous pulse ox, blood pressure and cardiac monitor Approach: midline Location: L3-4 Injection technique: single-shot Needle Needle type: Whitacre and Introducer  Needle gauge: 24 G Needle length: 9 cm Assessment Sensory level: T10 Events: CSF return Additional Notes Sterile aseptic technique used throughout the procedure.  Negative paresthesia. Negative blood return. Positive free-flowing CSF. Expiration date of kit checked and confirmed. Patient tolerated procedure well, without complications.

## 2023-06-10 NOTE — Interval H&P Note (Signed)
History and Physical Interval Note:  06/10/2023 6:45 AM  Stephanie Anthony  has presented today for surgery, with the diagnosis of PRIMART OSTEOARTHRITIS OF LEFT KNEE..  The various methods of treatment have been discussed with the patient and family. After consideration of risks, benefits and other options for treatment, the patient has consented to  Procedure(s): COMPUTER ASSISTED TOTAL KNEE ARTHROPLASTY (Left) as a surgical intervention.  The patient's history has been reviewed, patient examined, no change in status, stable for surgery.  I have reviewed the patient's chart and labs.  Questions were answered to the patient's satisfaction.     [name] [name]

## 2023-06-10 NOTE — Op Note (Signed)
OPERATIVE NOTE  DATE OF SURGERY:  06/10/2023  PATIENT NAME:  Stephanie Anthony   DOB: Mar 12, 1934  MRN: [id number]  PRE-OPERATIVE DIAGNOSIS: Degenerative arthrosis of the left knee, primary  POST-OPERATIVE DIAGNOSIS:  Same  PROCEDURE:  Left total knee arthroplasty using computer-assisted navigation  SURGEON:  Jena Gauss. M.D.  ASSISTANT:  Gean Birchwood, PA-C (present and scrubbed throughout the case, critical for assistance with exposure, retraction, instrumentation, and closure)  ANESTHESIA: spinal  ESTIMATED BLOOD LOSS: 50 mL  FLUIDS REPLACED: 400 mL of crystalloid  TOURNIQUET TIME: 77 minutes  DRAINS: 2 medium Hemovac drains  SOFT TISSUE RELEASES: Anterior cruciate ligament, posterior cruciate ligament, deep medial collateral ligament, patellofemoral ligament  IMPLANTS UTILIZED: DePuy Attune size 6N posterior stabilized femoral component (cemented), size 5 rotating platform tibial component (cemented), 35 mm medialized dome patella (cemented), and a 5 mm stabilized rotating platform polyethylene insert.  INDICATIONS FOR SURGERY: Stephanie Anthony is a 89 y.o. year old female with a long history of progressive knee pain. X-rays demonstrated severe degenerative changes in tricompartmental fashion. The patient had not seen any significant improvement despite conservative nonsurgical intervention. After discussion of the risks and benefits of surgical intervention, the patient expressed understanding of the risks benefits and agree with plans for total knee arthroplasty.   The risks, benefits, and alternatives were discussed at length including but not limited to the risks of infection, bleeding, nerve injury, stiffness, blood clots, the need for revision surgery, cardiopulmonary complications, among others, and they were willing to proceed.  PROCEDURE IN DETAIL: The patient was brought into the operating room and, after adequate spinal anesthesia was achieved, a tourniquet  was placed on the patient's upper thigh. The patient's knee and leg were cleaned and prepped with alcohol and DuraPrep and draped in the usual sterile fashion. A "timeout" was performed as per usual protocol. The lower extremity was exsanguinated using an Esmarch, and the tourniquet was inflated to 300 mmHg. An anterior longitudinal incision was made followed by a standard mid vastus approach. The deep fibers of the medial collateral ligament were elevated in a subperiosteal fashion off of the medial flare of the tibia so as to maintain a continuous soft tissue sleeve. The patella was subluxed laterally and the patellofemoral ligament was incised. Inspection of the knee demonstrated severe degenerative changes with full-thickness loss of articular cartilage. Osteophytes were debrided using a rongeur. Anterior and posterior cruciate ligaments were excised. Two 4.0 mm Schanz pins were inserted in the femur and into the tibia for attachment of the array of trackers used for computer-assisted navigation. Hip center was identified using a circumduction technique. Distal landmarks were mapped using the computer. The distal femur and proximal tibia were mapped using the computer. The distal femoral cutting guide was positioned using computer-assisted navigation so as to achieve a 5 distal valgus cut. The femur was sized and it was felt that a size 6N femoral component was appropriate. A size 6 femoral cutting guide was positioned and the anterior cut was performed and verified using the computer. This was followed by completion of the posterior and chamfer cuts. Femoral cutting guide for the central box was then positioned in the center box cut was performed.  Attention was then directed to the proximal tibia. Medial and lateral menisci were excised. The extramedullary tibial cutting guide was positioned using computer-assisted navigation so as to achieve a 0 varus-valgus alignment and 3 posterior slope. The cut was  performed and verified using the computer. The proximal tibia  was sized and it was felt that a size 5 tibial tray was appropriate. Tibial and femoral trials were inserted followed by insertion of a 5 mm polyethylene insert. This allowed for excellent mediolateral soft tissue balancing both in flexion and in full extension. Finally, the patella was cut and prepared so as to accommodate a 35 mm medialized dome patella. A patella trial was placed and the knee was placed through a range of motion with excellent patellar tracking appreciated. The femoral trial was removed after debridement of posterior osteophytes. The central post-hole for the tibial component was reamed followed by insertion of a keel punch. Tibial trials were then removed. Cut surfaces of bone were irrigated with copious amounts of normal saline using pulsatile lavage and then suctioned dry. Polymethylmethacrylate cement with gentamicin was prepared in the usual fashion using a vacuum mixer. Cement was applied to the cut surface of the proximal tibia as well as along the undersurface of a size 5 rotating platform tibial component. Tibial component was positioned and impacted into place. Excess cement was removed using Personal assistant. Cement was then applied to the cut surfaces of the femur as well as along the posterior flanges of the size 6N femoral component. The femoral component was positioned and impacted into place. Excess cement was removed using Personal assistant. A 5 mm polyethylene trial was inserted and the knee was brought into full extension with steady axial compression applied. Finally, cement was applied to the backside of a 35 mm medialized dome patella and the patellar component was positioned and patellar clamp applied. Excess cement was removed using Personal assistant. After adequate curing of the cement, the tourniquet was deflated after a total tourniquet time of 77 minutes. Hemostasis was achieved using electrocautery. The knee was  irrigated with copious amounts of normal saline using pulsatile lavage followed by 450 ml of Surgiphor and then suctioned dry. 20 mL of 1.3% Exparel and 60 mL of 0.25% Marcaine in 40 mL of normal saline was injected along the posterior capsule, medial and lateral gutters, and along the arthrotomy site. A 5 mm stabilized rotating platform polyethylene insert was inserted and the knee was placed through a range of motion with excellent mediolateral soft tissue balancing appreciated and excellent patellar tracking noted. 2 medium drains were placed in the wound bed and brought out through separate stab incisions. The medial parapatellar portion of the incision was reapproximated using interrupted sutures of #1 Vicryl. Subcutaneous tissue was approximated in layers using first #0 Vicryl followed #2-0 Vicryl. The skin was approximated with skin staples. A sterile dressing was applied.  The patient tolerated the procedure well and was transported to the recovery room in stable condition.    Jameek Bruntz P. Angie Fava., M.D.

## 2023-06-10 NOTE — Transfer of Care (Signed)
Immediate Anesthesia Transfer of Care Note  Patient: Stephanie Anthony  Procedure(s) Performed: COMPUTER ASSISTED TOTAL KNEE ARTHROPLASTY (Left: Knee)  Patient Location: PACU  Anesthesia Type:Spinal  Level of Consciousness: drowsy  Airway & Oxygen Therapy: Patient Spontanous Breathing and Patient connected to face mask oxygen  Post-op Assessment: Report given to RN and Post -op Vital signs reviewed and stable  Post vital signs: Reviewed and stable  Last Vitals:  Vitals Value Taken Time  BP 108/50 06/10/23 1111  Temp    Pulse 79 06/10/23 1113  Resp 14 06/10/23 1113  SpO2 99 % 06/10/23 1113  Vitals shown include unfiled device data.  Last Pain:  Vitals:   06/10/23 0620  TempSrc: Temporal  PainSc: 5          Complications: No notable events documented.

## 2023-06-10 NOTE — Anesthesia Preprocedure Evaluation (Addendum)
Anesthesia Evaluation  Patient identified by MRN, date of birth, ID band Patient awake    Reviewed: Allergy & Precautions, NPO status , Patient's Chart, lab work & pertinent test results  History of Anesthesia Complications Negative for: history of anesthetic complications  Airway Mallampati: III  TM Distance: >3 FB Neck ROM: full    Dental  (+) Chipped, Poor Dentition, Missing, Implants   Pulmonary former smoker   Pulmonary exam normal        Cardiovascular hypertension, (-) angina + CAD  Normal cardiovascular exam+ Valvular Problems/Murmurs      Neuro/Psych  PSYCHIATRIC DISORDERS  Depression Bipolar Disorder   negative neurological ROS     GI/Hepatic Neg liver ROS, hiatal hernia, PUD,GERD  Medicated,,  Endo/Other  Hypothyroidism    Renal/GU Renal disease     Musculoskeletal   Abdominal   Peds  Hematology negative hematology ROS (+)   Anesthesia Other Findings Past Medical History: No date: Arthritis No date: Bipolar 1 disorder (HCC) No date: Cataracts, bilateral No date: Chronic kidney disease     Comment:  stage 3 No date: Chronic kidney disease No date: Chronic mitral valve disease No date: Coronary artery disease No date: Deviated nasal septum No date: Erosive esophagitis No date: Erosive esophagitis No date: Esophageal stricture No date: Fibroid tumor No date: GERD (gastroesophageal reflux disease) No date: History of hiatal hernia No date: Hypertension No date: Hypothyroidism No date: Murmur, cardiac No date: Nephrogenic diabetes insipidus (HCC) No date: Pure hypercholesterolemia No date: Renal cyst, left     Comment:  bilateral No date: Seasonal allergies  Past Surgical History: No date: CARDIAC CATHETERIZATION No date: CHOLECYSTECTOMY 06/01/2018: COLONOSCOPY WITH PROPOFOL; N/A     Comment:  Procedure: COLONOSCOPY WITH PROPOFOL;  Surgeon:               Christena Deem, MD;  Location:  [facility] ENDOSCOPY;                Service: Endoscopy;  Laterality: N/A; No date: ESOPHAGEAL DILATION 10/20/2014: ESOPHAGOGASTRODUODENOSCOPY (EGD) WITH PROPOFOL; N/A     Comment:  Procedure: ESOPHAGOGASTRODUODENOSCOPY (EGD) WITH               PROPOFOL;  Surgeon: Christena Deem, MD;  Location:               [facility] ENDOSCOPY;  Service: Endoscopy;  Laterality: N/A; 06/01/2018: ESOPHAGOGASTRODUODENOSCOPY (EGD) WITH PROPOFOL; N/A     Comment:  Procedure: ESOPHAGOGASTRODUODENOSCOPY (EGD) WITH               PROPOFOL;  Surgeon: Christena Deem, MD;  Location:               [facility] ENDOSCOPY;  Service: Endoscopy;  Laterality: N/A; No date: INFECTED SKIN DEBRIDEMENT     Comment:  on back several years ago No date: JOINT REPLACEMENT No date: knee replacement, right 06/21/2018: LAPAROSCOPIC APPENDECTOMY; N/A     Comment:  Procedure: APPENDECTOMY LAPAROSCOPIC;  Surgeon:               Carolan Shiver, MD;  Location: [facility] ORS;  Service:              General;  Laterality: N/A; No date: NOSE SURGERY No date: THYROIDECTOMY No date: TOTAL THYROIDECTOMY  BMI    Body Mass Index: 26.48 kg/m      Reproductive/Obstetrics negative OB ROS  Anesthesia Physical Anesthesia Plan  ASA: 3  Anesthesia Plan: Spinal   Post-op Pain Management: Regional block* and Ofirmev IV (intra-op)*   Induction: Intravenous  PONV Risk Score and Plan: 2 and Propofol infusion and TIVA  Airway Management Planned: Natural Airway and Nasal Cannula  Additional Equipment:   Intra-op Plan:   Post-operative Plan:   Informed Consent: I have reviewed the patients History and Physical, chart, labs and discussed the procedure including the risks, benefits and alternatives for the proposed anesthesia with the patient or authorized representative who has indicated his/her understanding and acceptance.     Dental Advisory Given  Plan Discussed with: Anesthesiologist, CRNA and  Surgeon  Anesthesia Plan Comments: (Patient reports no bleeding problems and no anticoagulant use.  Plan for spinal with backup GA  Patient consented for risks of anesthesia including but not limited to:  - adverse reactions to medications - damage to eyes, teeth, lips or other oral mucosa - nerve damage due to positioning  - risk of bleeding, infection and or nerve damage from spinal that could lead to paralysis - risk of headache or failed spinal - damage to teeth, lips or other oral mucosa - sore throat or hoarseness - damage to heart, brain, nerves, lungs, other parts of body or loss of life  Patient voiced understanding and assent.)       Anesthesia Quick Evaluation

## 2023-06-10 NOTE — Evaluation (Signed)
Physical Therapy Evaluation Patient Details Name: Stephanie Anthony MRN: [id number] DOB: 02-Jun-1933 Today's Date: 06/10/2023  History of Present Illness  Patient is an 89 year old female s/p L TKA. History of R TKA 13 years ago.  Clinical Impression  Patient received in bed, RN at bedside. She is a bit lethargic, can wiggle feet and has sensation. Patient is able to perform bed mobility with min A. She sat edge of bed for a few minutes and then returned to supine. Patient reported feeling weird and heavy. Mild dizziness with sitting. Patient will continue to benefit from skilled PT to improve functional mobility, strength and safety.           If plan is discharge home, recommend the following: A lot of help with walking and/or transfers;A lot of help with bathing/dressing/bathroom;Assist for transportation;Help with stairs or ramp for entrance;Direct supervision/assist for financial management;Assistance with cooking/housework   Can travel by private vehicle    no    Equipment Recommendations Rolling walker (2 wheels);BSC/3in1  Recommendations for Other Services       Functional Status Assessment Patient has had a recent decline in their functional status and demonstrates the ability to make significant improvements in function in a reasonable and predictable amount of time.     Precautions / Restrictions Precautions Precautions: Fall Restrictions Weight Bearing Restrictions Per Provider Order: Yes LLE Weight Bearing Per Provider Order: Weight bearing as tolerated      Mobility  Bed Mobility Overal bed mobility: Needs Assistance Bed Mobility: Supine to Sit, Sit to Supine     Supine to sit: Min assist Sit to supine: Min assist   General bed mobility comments: Patient generally slow requiring only min A for sitting edge of bed. Reports she feels weird and really heavy. mild dizziness in sitting, therefore we did not attempt to stand.    Transfers                         Ambulation/Gait                  Stairs            Wheelchair Mobility     Tilt Bed    Modified Rankin (Stroke Patients Only)       Balance Overall balance assessment: Needs assistance Sitting-balance support: Feet supported Sitting balance-Leahy Scale: Fair Sitting balance - Comments: supervision to cga for sitting edge of bed                                     Pertinent Vitals/Pain Pain Assessment Pain Assessment: Faces Faces Pain Scale: Hurts a little bit Pain Location: L knee Pain Descriptors / Indicators: Discomfort Pain Intervention(s): Monitored during session, Repositioned    Home Living Family/patient expects to be discharged to:: Private residence Living Arrangements: Alone Available Help at Discharge: Family;Available 24 hours/day (son is here to help her after she gets discharged home.) Type of Home: House Home Access: Stairs to enter Entrance Stairs-Rails: Right;Left;Can reach both Entrance Stairs-Number of Steps: 4   Home Layout: One level   Additional Comments: unsure of equipment.    Prior Function Prior Level of Function : Independent/Modified Independent             Mobility Comments: patient reports she does not drive. son comes every two weeks to assist her with things. ADLs Comments: independent  Extremity/Trunk Assessment   Upper Extremity Assessment Upper Extremity Assessment: Defer to OT evaluation    Lower Extremity Assessment Lower Extremity Assessment: LLE deficits/detail LLE: Unable to fully assess due to pain LLE Coordination: decreased gross motor    Cervical / Trunk Assessment Cervical / Trunk Assessment: Normal  Communication   Communication Communication: No apparent difficulties Cueing Techniques: Verbal cues;Gestural cues  Cognition Arousal: Lethargic Behavior During Therapy: WFL for tasks assessed/performed Overall Cognitive Status: Within Functional Limits for  tasks assessed                                          General Comments      Exercises     Assessment/Plan    PT Assessment Patient needs continued PT services  PT Problem List Decreased strength;Decreased range of motion;Decreased activity tolerance;Decreased mobility;Decreased balance;Decreased cognition;Pain       PT Treatment Interventions DME instruction;Gait training;Stair training;Functional mobility training;Therapeutic activities;Therapeutic exercise;Balance training;Neuromuscular re-education;Cognitive remediation;Patient/family education    PT Goals (Current goals can be found in the Care Plan section)  Acute Rehab PT Goals Patient Stated Goal: to return home with help from son PT Goal Formulation: With patient Time For Goal Achievement: 06/17/23 Potential to Achieve Goals: Good    Frequency BID     Co-evaluation               AM-PAC PT "6 Clicks" Mobility  Outcome Measure Help needed turning from your back to your side while in a flat bed without using bedrails?: A Little Help needed moving from lying on your back to sitting on the side of a flat bed without using bedrails?: A Little Help needed moving to and from a bed to a chair (including a wheelchair)?: A Lot Help needed standing up from a chair using your arms (e.g., wheelchair or bedside chair)?: A Lot Help needed to walk in hospital room?: A Lot Help needed climbing 3-5 steps with a railing? : Total 6 Click Score: 13    End of Session   Activity Tolerance: Patient limited by lethargy Patient left: in bed;with call bell/phone within reach;with nursing/sitter in room Nurse Communication: Mobility status PT Visit Diagnosis: Other abnormalities of gait and mobility (R26.89);Muscle weakness (generalized) (M62.81);Pain Pain - Right/Left: Left Pain - part of body: Knee    Time: 1440-1453 PT Time Calculation (min) (ACUTE ONLY): 13 min   Charges:   PT Evaluation $PT Eval  Moderate Complexity: 1 Mod   PT General Charges $$ ACUTE PT VISIT: 1 Visit         Travell Desaulniers, PT, GCS 06/10/23,3:42 PM

## 2023-06-11 ENCOUNTER — Encounter: Payer: Self-pay | Admitting: Orthopedic Surgery

## 2023-06-11 DIAGNOSIS — M1712 Unilateral primary osteoarthritis, left knee: Secondary | ICD-10-CM | POA: Diagnosis not present

## 2023-06-11 MED ORDER — OXYCODONE HCL 5 MG PO TABS
ORAL_TABLET | ORAL | Status: AC
  Filled 2023-06-11: qty 1

## 2023-06-11 MED ORDER — LEVOTHYROXINE SODIUM 25 MCG PO TABS
ORAL_TABLET | ORAL | Status: AC
  Filled 2023-06-11: qty 4

## 2023-06-11 MED ORDER — ONDANSETRON HCL 4 MG PO TABS
ORAL_TABLET | ORAL | Status: AC
  Filled 2023-06-11: qty 1

## 2023-06-11 MED ORDER — METOCLOPRAMIDE HCL 10 MG PO TABS
ORAL_TABLET | ORAL | Status: AC
  Filled 2023-06-11: qty 1

## 2023-06-11 MED ORDER — ASPIRIN 81 MG PO CHEW: 81.0000 mg | CHEWABLE_TABLET | Freq: Two times a day (BID) | ORAL | Status: DC

## 2023-06-11 MED ORDER — ASPIRIN 81 MG PO CHEW
CHEWABLE_TABLET | ORAL | Status: AC
  Filled 2023-06-11: qty 1

## 2023-06-11 MED ORDER — FERROUS SULFATE 325 (65 FE) MG PO TABS
ORAL_TABLET | ORAL | Status: AC
Start: 2023-06-11 — End: ?
  Filled 2023-06-11: qty 1

## 2023-06-11 MED ORDER — FERROUS SULFATE 325 (65 FE) MG PO TABS
ORAL_TABLET | ORAL | Status: AC
  Filled 2023-06-11: qty 1

## 2023-06-11 MED ORDER — ASPIRIN 81 MG PO CHEW
CHEWABLE_TABLET | ORAL | Status: AC
Start: 2023-06-11 — End: ?
  Filled 2023-06-11: qty 1

## 2023-06-11 MED ORDER — PRAVASTATIN SODIUM 20 MG PO TABS
ORAL_TABLET | ORAL | Status: AC
  Filled 2023-06-11: qty 2

## 2023-06-11 MED ORDER — ACETAMINOPHEN 325 MG PO TABS
ORAL_TABLET | ORAL | Status: AC
  Filled 2023-06-11: qty 2

## 2023-06-11 MED ORDER — ACETAMINOPHEN 10 MG/ML IV SOLN
INTRAVENOUS | Status: AC
Start: 2023-06-11 — End: ?
  Filled 2023-06-11: qty 100

## 2023-06-11 MED ORDER — TRAMADOL HCL 50 MG PO TABS: 50.0000 mg | ORAL_TABLET | ORAL | 0 refills | Status: DC | PRN

## 2023-06-11 MED ORDER — LAMOTRIGINE 25 MG PO TABS
ORAL_TABLET | ORAL | Status: AC
  Filled 2023-06-11: qty 4

## 2023-06-11 MED ORDER — SENNOSIDES-DOCUSATE SODIUM 8.6-50 MG PO TABS
ORAL_TABLET | ORAL | Status: AC
  Filled 2023-06-11: qty 1

## 2023-06-11 MED ORDER — PANTOPRAZOLE SODIUM 40 MG PO TBEC
DELAYED_RELEASE_TABLET | ORAL | Status: AC
Start: 2023-06-11 — End: ?
  Filled 2023-06-11: qty 1

## 2023-06-11 MED ORDER — TRAMADOL HCL 50 MG PO TABS
ORAL_TABLET | ORAL | Status: AC
  Filled 2023-06-11: qty 1

## 2023-06-11 MED ORDER — OXYCODONE HCL 5 MG PO TABS: 5.0000 mg | ORAL_TABLET | ORAL | 0 refills | Status: DC | PRN

## 2023-06-11 MED ORDER — MAGNESIUM HYDROXIDE 400 MG/5ML PO SUSP
ORAL | Status: AC
  Filled 2023-06-11: qty 30

## 2023-06-11 NOTE — Discharge Summary (Deleted)
Physician Discharge Summary  Subjective: 1 Day Post-Op Procedure(s) (LRB): COMPUTER ASSISTED TOTAL KNEE ARTHROPLASTY (Left) Patient reports pain as moderate.   Patient seen in rounds with Dr. Ernest Pine. Patient is well, and has had no acute complaints or problems.  She denies any CP, SOB, fevers.  She does report having some chills last night as well as some nausea, no episodes of emesis. We we will continue to work with therapy today. Patient is ready to go home  Physician Discharge Summary  Patient ID: Stephanie Anthony MRN: [id number] DOB/AGE: January 08, 1934 89 y.o.  Admit date: 06/10/2023 Discharge date: 06/11/2023  Admission Diagnoses:  Discharge Diagnoses:  Principal Problem:   History of total knee arthroplasty, left   Discharged Condition: good  Hospital Course: Patient presented to the hospital on 06/10/2023 for an elective left total knee arthroplasty performed by Dr. Ernest Pine. Patient was given 1g of TXA and 2g of Ancef prior to the procedure. she tolerated the procedure well without any complications. See procedural note below for details. Postoperatively, the patient did very well. she was able to pass PT protocols on post-op day one without any issues. JP drain was removed without any difficulty and was intact. she was able to void her bladder without any difficulty. Physical exam was unremarkable.  She does admit to having some chills and intermittent nausea, but otherwise she denies any SOB, CP, Vomiting, fevers. Vital signs are stable. Patient is stable to discharge home.  PROCEDURE:  Left total knee arthroplasty using computer-assisted navigation   SURGEON:  Jena Gauss. M.D.   ASSISTANT:  Gean Birchwood, PA-C (present and scrubbed throughout the case, critical for assistance with exposure, retraction, instrumentation, and closure)   ANESTHESIA: spinal   ESTIMATED BLOOD LOSS: 50 mL   FLUIDS REPLACED: 400 mL of crystalloid   TOURNIQUET TIME: 77 minutes   DRAINS:  2 medium Hemovac drains   SOFT TISSUE RELEASES: Anterior cruciate ligament, posterior cruciate ligament, deep medial collateral ligament, patellofemoral ligament   IMPLANTS UTILIZED: DePuy Attune size 6N posterior stabilized femoral component (cemented), size 5 rotating platform tibial component (cemented), 35 mm medialized dome patella (cemented), and a 5 mm stabilized rotating platform polyethylene insert.    Treatments: none  Discharge Exam: Blood pressure 106/60, pulse 66, temperature 97.8 F (36.6 C), temperature source Temporal, resp. rate 16, height 5\' 7"  (1.702 m), weight 76.7 kg, SpO2 98%.   Disposition: home   Allergies as of 06/11/2023       Reactions   Aspirin Other (See Comments)   Rectal bleeding   Codeine Nausea And Vomiting   Prednisone Other (See Comments)   Hallucinations   Valium [diazepam] Other (See Comments)   Strange feelings        Medication List     TAKE these medications    aspirin 81 MG chewable tablet Chew 1 tablet (81 mg total) by mouth 2 (two) times daily.   lamoTRIgine 100 MG tablet Commonly known as: LAMICTAL Take 1 tablet (100 mg total) by mouth at bedtime. Start taking on: July 08, 2023   latanoprost 0.005 % ophthalmic solution Commonly known as: XALATAN Place 1 drop into both eyes at bedtime.   levothyroxine 100 MCG tablet Commonly known as: SYNTHROID Take 100 mcg by mouth daily before breakfast.   losartan 25 MG tablet Commonly known as: COZAAR Take 25 mg by mouth daily.   lovastatin 40 MG tablet Commonly known as: MEVACOR Take 40 mg by mouth at bedtime.   oxyCODONE 5 MG immediate  release tablet Commonly known as: Oxy IR/ROXICODONE Take 1 tablet (5 mg total) by mouth every 4 (four) hours as needed for moderate pain (pain score 4-6) (pain score 4-6).   pantoprazole 40 MG tablet Commonly known as: PROTONIX Take 40 mg by mouth daily.   traMADol 50 MG tablet Commonly known as: ULTRAM Take 1-2 tablets (50-100 mg  total) by mouth every 4 (four) hours as needed for moderate pain (pain score 4-6).               Durable Medical Equipment  (From admission, onward)           Start     Ordered   06/10/23 1103  DME Walker rolling  Once       Question:  Patient needs a walker to treat with the following condition  Answer:  Total knee replacement status   06/10/23 1102   06/10/23 1103  DME Bedside commode  Once       Comments: Patient is not able to walk the distance required to go the bathroom, or he/she is unable to safely negotiate stairs required to access the bathroom.  A 3in1 BSC will alleviate this problem  Question:  Patient needs a bedside commode to treat with the following condition  Answer:  Total knee replacement status   06/10/23 1102            Follow-up Information     Rayburn Go, PA-C Follow up on 06/24/2023.   Specialty: Orthopedic Surgery Why: at 10:30am Contact information: [address] [place] [address] [phone]         Donato Heinz, MD Follow up on 07/23/2023.   Specialty: Orthopedic Surgery Why: at 2:45pm Contact information: [address] [facility] [place] [address] [phone]                 Signed: Gean Birchwood 06/11/2023, 8:41 AM   Objective: Vital signs in last 24 hours: Temp:  [97 F (36.1 C)-98.7 F (37.1 C)] 97.8 F (36.6 C) (01/23 0628) Pulse Rate:  [57-87] 66 (01/23 0628) Resp:  [13-33] 16 (01/23 0628) BP: (83-112)/(41-77) 106/60 (01/23 0628) SpO2:  [95 %-100 %] 98 % (01/23 0628)  Intake/Output from previous day:  Intake/Output Summary (Last 24 hours) at 06/11/2023 0841 Last data filed at 06/11/2023 0600 Gross per 24 hour  Intake 800 ml  Output 715 ml  Net 85 ml    Intake/Output this shift: No intake/output data recorded.  Labs: No results for input(s): "HGB" in the last 72 hours. No results for input(s): "WBC", "RBC", "HCT", "PLT" in the last 72 hours. No results for  input(s): "NA", "K", "CL", "CO2", "BUN", "CREATININE", "GLUCOSE", "CALCIUM" in the last 72 hours. No results for input(s): "LABPT", "INR" in the last 72 hours.  EXAM: General - Patient is Alert, Appropriate, and Oriented Extremity - Neurologically intact ABD soft Neurovascular intact Sensation intact distally Intact pulses distally Dorsiflexion/Plantar flexion intact No cellulitis present Compartment soft Dressing - dressing C/D/I and no drainage Motor Function - intact, moving foot and toes well on exam.  Able to plantar and dorsiflex with good strength and range of motion.  Neurovascular intact all dermatomes extending down her left lower extremity.  Posterior tibial pulses appreciated. JP Drain pulled without difficulty. Intact  Assessment/Plan: 1 Day Post-Op Procedure(s) (LRB): COMPUTER ASSISTED TOTAL KNEE ARTHROPLASTY (Left) Procedure(s) (LRB): COMPUTER ASSISTED TOTAL KNEE ARTHROPLASTY (Left) Past Medical History:  Diagnosis Date   Arthritis    Bipolar  1 disorder (HCC)    Cataracts, bilateral    Chronic kidney disease    stage 3   Chronic kidney disease    Chronic mitral valve disease    Coronary artery disease    Deviated nasal septum    Erosive esophagitis    Erosive esophagitis    Esophageal stricture    Fibroid tumor    GERD (gastroesophageal reflux disease)    History of hiatal hernia    Hypertension    Hypothyroidism    Murmur, cardiac    Nephrogenic diabetes insipidus (HCC)    Pure hypercholesterolemia    Renal cyst, left    bilateral   Seasonal allergies    Principal Problem:   History of total knee arthroplasty, left  Estimated body mass index is 26.48 kg/m as calculated from the following:   Height as of this encounter: 5\' 7"  (1.702 m).   Weight as of this encounter: 76.7 kg. Patient will continue to work with home health physical therapy on gait, strength and ROM of this left lower extremity   Discussed with the patient continuing to utilize  Polar Care   Patient will use bone foam in 20-30 minute intervals   Patient will wear TED hose bilaterally to help prevent DVT and clot formation   Discussed the Aquacel bandage.  This bandage will stay in place 7 days postoperatively.  Can be replaced with honeycomb bandages that will be sent home with the patient   Discussed sending the patient home with tramadol and oxycodone for as needed pain management. Patient will take an 81 mg aspirin twice daily for DVT prophylaxis.  Discussed keeping an eye on any bleeding issues or risks that may develop.   JP drain removed without difficulty, intact   Weight-Bearing as tolerated to left leg   Patient will follow-up with [facility] clinic orthopedics in 2 weeks for staple removal and reevaluation  Diet - Regular diet Follow up - in 2 weeks Activity - WBAT Disposition - Home Condition Upon Discharge - Good DVT Prophylaxis - Aspirin and TED hose  Danise Edge, PA-C Orthopaedic Surgery 06/11/2023, 8:41 AM

## 2023-06-11 NOTE — Care Management Obs Status (Deleted)
MEDICARE OBSERVATION STATUS NOTIFICATION   Patient Details  Name: Stephanie Anthony MRN: [id number] Date of Birth: [birth year]   Medicare Observation Status Notification Given:       Sherilyn Banker 06/11/2023, 10:08 AM

## 2023-06-11 NOTE — Evaluation (Signed)
Clinical/Bedside Swallow Evaluation Patient Details  Name: Stephanie Anthony MRN: [id number] Date of Birth: 29-Dec-1933  Today's Date: 06/11/2023 Time: SLP Start Time (ACUTE ONLY): 1140 SLP Stop Time (ACUTE ONLY): 1240 SLP Time Calculation (min) (ACUTE ONLY): 60 min  Past Medical History:  Past Medical History:  Diagnosis Date   Arthritis    Bipolar 1 disorder (HCC)    Cataracts, bilateral    Chronic kidney disease    stage 3   Chronic kidney disease    Chronic mitral valve disease    Coronary artery disease    Deviated nasal septum    Erosive esophagitis    Erosive esophagitis    Esophageal stricture    Fibroid tumor    GERD (gastroesophageal reflux disease)    History of hiatal hernia    Hypertension    Hypothyroidism    Murmur, cardiac    Nephrogenic diabetes insipidus (HCC)    Pure hypercholesterolemia    Renal cyst, left    bilateral   Seasonal allergies    Past Surgical History:  Past Surgical History:  Procedure Laterality Date   CARDIAC CATHETERIZATION     CHOLECYSTECTOMY     COLONOSCOPY WITH PROPOFOL N/A 06/01/2018   Procedure: COLONOSCOPY WITH PROPOFOL;  Surgeon: Christena Deem, MD;  Location: [facility] ENDOSCOPY;  Service: Endoscopy;  Laterality: N/A;   ESOPHAGEAL DILATION     ESOPHAGOGASTRODUODENOSCOPY (EGD) WITH PROPOFOL N/A 10/20/2014   Procedure: ESOPHAGOGASTRODUODENOSCOPY (EGD) WITH PROPOFOL;  Surgeon: Christena Deem, MD;  Location: [facility] ENDOSCOPY;  Service: Endoscopy;  Laterality: N/A;   ESOPHAGOGASTRODUODENOSCOPY (EGD) WITH PROPOFOL N/A 06/01/2018   Procedure: ESOPHAGOGASTRODUODENOSCOPY (EGD) WITH PROPOFOL;  Surgeon: Christena Deem, MD;  Location: [facility] ENDOSCOPY;  Service: Endoscopy;  Laterality: N/A;   INFECTED SKIN DEBRIDEMENT     on back several years ago   JOINT REPLACEMENT     KNEE ARTHROPLASTY Left 06/10/2023   Procedure: COMPUTER ASSISTED TOTAL KNEE ARTHROPLASTY;  Surgeon: Donato Heinz, MD;  Location: [facility] ORS;  Service: Orthopedics;   Laterality: Left;   knee replacement, right     LAPAROSCOPIC APPENDECTOMY N/A 06/21/2018   Procedure: APPENDECTOMY LAPAROSCOPIC;  Surgeon: Carolan Shiver, MD;  Location: [facility] ORS;  Service: General;  Laterality: N/A;   NOSE SURGERY     THYROIDECTOMY     TOTAL THYROIDECTOMY     HPI:  Patient is an 89 year old female s/p L TKA. History of R TKA 13 years ago. She has had left knee pain that has persisted for multiple years. Pt lives at home Alone; Son in [place].  PMH: Acquired hypothyroidism  Bipolar disorder type I followed by Dr. Imogene Burn,  Cataracts, bilateral  Colon polyp  Environmental allergies  smoke, dust, dog dander,  Erosive esophagitis 10/20/2014,  Erosive gastritis 10/20/2014,  Esophageal stricture 11/13/2008 Dr. Elenore Rota,  Essential hypertension,  Fibroid tumor,  Mitral valve insufficiency and aortic valve insufficiency 09/27/2014,  Osteoarthritis,  Pure hypercholesterolemia  Stage 4 chronic kidney disease.  Pt has had multiple EGD/dxs including:  "Per her chart in 2020, she was dx'd w/ significant Esophageal phase Dysmotility and per EGD:  Abnormal Esophageal Motility  Narrowing d/t Schatzki's Ring - Dilated  Hiatal hernia  Gastritis". She has not followed up w/ GI since per her report.  No chest imaging per chart.    Assessment / Plan / Recommendation  Clinical Impression   Pt seen for BSE today per MD request. Pt is 1 Day Post-Op Procedure of L TKA. Pt awake, verbal and followed all commands.  A/O x4.  Pt endorsed a Baseline difficulty swallowing w/ solids and Esohageal phase issues swallowing but was not clear on details of such except that "it was years ago". Per Chart notes, pt has had Esophageal phase Dysmotility dx'd several years ago and most recently in 05/2018 by EGD/GI.  On RA, afebrile.    Pt appeared to present w/ functional oropharyngeal phase swallowing w/ No oropharyngeal phase dysphagia noted, No neuromuscular deficits noted. Pt consumed po trials w/ No overt, clinical s/s of  aspiration during the po trials.  Pt appears at reduced risk for aspiration from an oropharyngeal phase standpoint when following general aspiration precautions.  However, pt does have challenging factors that could impact her oropharyngeal swallowing to include acute illness/surgery, deconditioning/weakness, advanced age AND KNOWN Esophageal phase Dysmotility: Per her chart in 2020, she was dx'd w/ significant Esophageal phase Dysmotility per EGD:  "Abnormal Esophageal Motility Narrowing d/t Schatzki's Ring - Dilated Hiatal hernia Gastritis".  ANY Esophageal phase Dysmotility can increase the risk for REFLUX and Regurgitation which can increase the risk for aspiration of REFLUX material. These factors can increase risk for decreased oral intake overall.   During po trials, pt consumed all consistencies w/ no overt coughing, decline in vocal quality, or change in respiratory presentation during/post trials. No decline in O2 sats from the mid-upper 90s during intake. Oral phase appeared [facility] w/ timely bolus management, mastication, and control of bolus propulsion for A-P transfer for swallowing. Oral clearing achieved w/ all trial consistencies -- moistened, soft foods given.  OM Exam appeared [facility] w/ no unilateral weakness noted. Speech Clear. Pt fed self w/ setup support.   OF NOTE: No oropharyngeal phase swallowing issues -- no overt clinical s/s of aspiration noted. All boluses appeared to clear the oropharynx, however, pt began to exhibit discomfort during soft solid food trials pointing to her sternal notch area>mid-sternum area, the Esophageal area. Pt's "Abnormal Esophageal Motility" per EGD note is CHRONIC and will always impact her oral intake/clearing of the Esophagus -- when foods/liquids don't clear the Esophagus in a timely manner, they can start "backing up", and it will feel like it is "backing up" into the throat and increase risk for choking on REFLUX material.  Pt has not followed up w/  a GI she stated. Her presentation and complaints of difficulty/discomfort could strongly result in REDUCED oral intake = reduced hydration/nutrition which could increase risk for Falls. W/ her weakness/recent surgery and this presentation, she would be recommended to have 100% Supervision in the home for support w/ meals/prep over the next few weeks to ensure she is eating/drinking. Recommend GI f/u for assessment/tx as soon as possible.  Recommend a more Mech Soft/Regular diet (chopped, moistened foods) w/ thin liquids VIA CUP. General aspiration precautions. REFLUX precautions strongly recommended to lessen chance for Regurgitation -- Rest Breaks during meals/oral intake to allow for Esophageal clearing. SMALL bites/sips SLOWLY and alternate b/t. HOB elevated post meals and at night when sleeping. Pills in Puree - whole vs crushed.   Recommend pt f/u w/ GI for assessment/management of pt's complaints and KNOWN dxs w/ tx as indicated. Discussion and handout given on REFLUX, precautions. No further skilled ST services indicated acutely. MD to reconsult ST services if any new needs while admitted. NSG updated.  Pt appreciative of Education information. Recommend Dietician f/u for support.  SLP Visit Diagnosis: Dysphagia, unspecified (R13.10) (KNOWN Esophageal phase Dysmotility)    Aspiration Risk  Mild aspiration risk;Risk for inadequate nutrition/hydration (from REFLUX and Regurgitation)  Diet Recommendation   Thin;Dysphagia 3 (mechanical soft) (gravies and chopped meats) = a more Mech Soft/Regular diet (chopped, moistened foods) w/ thin liquids VIA CUP. General aspiration precautions. REFLUX precautions strongly recommended to lessen chance for Regurgitation -- Rest Breaks during meals/oral intake to allow for Esophageal clearing. SMALL bites/sips SLOWLY and alternate b/t. HOB elevated post meals and at night when sleeping.   Medication Administration: Whole meds with puree (vs need to Crush)     Other  Recommendations Recommended Consults: Consider GI evaluation;Consider esophageal assessment (Dietician for support) Oral Care Recommendations: Oral care BID;Patient independent with oral care    Recommendations for follow up therapy are one component of a multi-disciplinary discharge planning process, led by the attending physician.  Recommendations may be updated based on patient status, additional functional criteria and insurance authorization.  Follow up Recommendations No SLP follow up (f/u w/ GI)      Assistance Recommended at Discharge  Intermittent at meals for support  Functional Status Assessment Patient has had a recent decline in their functional status and/or demonstrates limited ability to make significant improvements in function in a reasonable and predictable amount of time  Frequency and Duration  (n/a)   (n/a)       Prognosis Prognosis for improved oropharyngeal function: Fair Barriers to Reach Goals: Time post onset;Severity of deficits Barriers/Prognosis Comment: KNOWN Esophageal phase Dysmotility      Swallow Study   General Date of Onset: 06/10/23 HPI: Patient is an 89 year old female s/p L TKA. History of R TKA 13 years ago. She has had left knee pain that has persisted for multiple years. Pt lives at home Alone; Son in [place].  PMH: Acquired hypothyroidism  Bipolar disorder type I followed by Dr. Imogene Burn,  Cataracts, bilateral  Colon polyp  Environmental allergies  smoke, dust, dog dander,  Erosive esophagitis 10/20/2014,  Erosive gastritis 10/20/2014,  Esophageal stricture 11/13/2008 Dr. Elenore Rota,  Essential hypertension,  Fibroid tumor,  Mitral valve insufficiency and aortic valve insufficiency 09/27/2014,  Osteoarthritis,  Pure hypercholesterolemia  Stage 4 chronic kidney disease.  Pt has had multiple EGD/dxs including:  "Per her chart in 2020, she was dx'd w/ significant Esophageal phase Dysmotility and per EGD:  Abnormal Esophageal Motility  Narrowing d/t  Schatzki's Ring - Dilated  Hiatal hernia  Gastritis". She has not followed up w/ GI since per her report.  No chest imaging per chart. Type of Study: Bedside Swallow Evaluation Previous Swallow Assessment: regular, thins Diet Prior to this Study: Regular;Thin liquids (Level 0) Temperature Spikes Noted: No Respiratory Status: Room air History of Recent Intubation: No Behavior/Cognition: Alert;Cooperative;Pleasant mood (deconditioned post surgery) Oral Cavity Assessment: Dry (many c/o such at home(discussed xerostomia, causes of, and strategies for)) Oral Care Completed by SLP: Recent completion by staff Oral Cavity - Dentition: Adequate natural dentition;Missing dentition;Poor condition (few, molars. has a bridge.) Vision: Functional for self-feeding Self-Feeding Abilities: Able to feed self;Needs set up Patient Positioning: Upright in bed (MOD support) Baseline Vocal Quality: Normal Volitional Cough: Strong Volitional Swallow: Able to elicit    Oral/Motor/Sensory Function Overall Oral Motor/Sensory Function: Within functional limits   Ice Chips Ice chips: Within functional limits Presentation: Spoon (fed; 3 trials)   Thin Liquid Thin Liquid: Within functional limits Presentation: Cup;Self Fed (10 trials)    Nectar Thick Nectar Thick Liquid: Not tested   Honey Thick Honey Thick Liquid: Not tested   Puree Puree: Within functional limits Presentation: Spoon;Self Fed (9 trials)   Solid     Solid: Within  functional limits Presentation: Self Fed;Spoon (5 trials) Other Comments: mild c/o Esophageal phase Dysmotility        Jerilynn Som, MS, CCC-SLP Speech Language Pathologist Rehab Services; [facility] [place] [phone] (ascom) Moosa Bueche 06/11/2023,3:41 PM

## 2023-06-11 NOTE — Plan of Care (Signed)
  Problem: Elimination: Goal: Will not experience complications related to urinary retention Outcome: Progressing   Problem: Pain Managment: Goal: General experience of comfort will improve and/or be controlled Outcome: Progressing   Problem: Safety: Goal: Ability to remain free from injury will improve Outcome: Progressing

## 2023-06-11 NOTE — Discharge Instructions (Addendum)
[facility].  [address] , [place], [place], [address]. [phone] They will call you to arrange when they can come to see you    Instructions after Total Knee Replacement   James P. Angie Fava., M.D.    Dept. of Orthopaedics & Sports Medicine [facility] [address] [place], [place]  [address]  Phone: [phone]   Fax: [phone]       [email or web address]       DIET: Drink plenty of non-alcoholic fluids. Resume your normal diet. Include foods high in fiber.  ACTIVITY:  You may use crutches or a walker with weight-bearing as tolerated, unless instructed otherwise. You may be weaned off of the walker or crutches by your Physical Therapist.  Do NOT place pillows under the knee. Anything placed under the knee could limit your ability to straighten the knee.   Continue doing gentle exercises. Exercising will reduce the pain and swelling, increase motion, and prevent muscle weakness.   Please continue to use the TED compression stockings for 6 weeks. You may remove the stockings at night, but should reapply them in the morning. Do not drive or operate any equipment until instructed.  WOUND CARE:  Continue to use the PolarCare or ice packs periodically to reduce pain and swelling. You may bathe or shower after the staples are removed at the first office visit following surgery.  MEDICATIONS: You may resume your regular medications. Please take the pain medication as prescribed on the medication. Do not take pain medication on an empty stomach. You have been given a prescription for a blood thinner, aspirin 81 mg. Please take this twice a day. Do not drive or drink alcoholic beverages when taking pain medications.  CALL THE OFFICE FOR: Temperature above 101 degrees Excessive bleeding or drainage on the dressing. Excessive swelling, coldness, or paleness of the toes. Persistent nausea and vomiting.  FOLLOW-UP:  You should have an appointment to return  to the office in 10-14 days after surgery. Arrangements have been made for continuation of Physical Therapy (either home therapy or outpatient therapy).

## 2023-06-11 NOTE — Plan of Care (Signed)
  Problem: Activity: Goal: Ability to avoid complications of mobility impairment will improve Outcome: Progressing   Problem: Pain Management: Goal: Pain level will decrease with appropriate interventions Outcome: Progressing   Problem: Clinical Measurements: Goal: Respiratory complications will improve Outcome: Progressing   Problem: Activity: Goal: Risk for activity intolerance will decrease Outcome: Progressing   Problem: Coping: Goal: Level of anxiety will decrease Outcome: Progressing   Problem: Elimination: Goal: Will not experience complications related to urinary retention Outcome: Progressing

## 2023-06-11 NOTE — Progress Notes (Signed)
Physical Therapy Treatment Patient Details Name: Stephanie Anthony MRN: [id number] DOB: April 07, 1934 Today's Date: 06/11/2023   History of Present Illness Patient is an 89 year old female s/p L TKA. History of R TKA 13 years ago.    PT Comments  Patient received sitting edge of bed with OT present. She ambulated into bathroom and then to door ~40 feet total with RW and cga. Patient limited by pain.Reports 9/10 currently, RN notified. She will continue to benefit from skilled PT to improve mobility, independence, ROM and strength.     If plan is discharge home, recommend the following: Assist for transportation;Help with stairs or ramp for entrance;Direct supervision/assist for financial management;Assistance with cooking/housework;A little help with walking and/or transfers;A little help with bathing/dressing/bathroom   Can travel by private vehicle      yes  Equipment Recommendations  Rolling walker (2 wheels);BSC/3in1    Recommendations for Other Services       Precautions / Restrictions Precautions Precautions: Fall Restrictions Weight Bearing Restrictions Per Provider Order: Yes LLE Weight Bearing Per Provider Order: Weight bearing as tolerated     Mobility  Bed Mobility               General bed mobility comments: Patient received sitting on side of bed with OT present    Transfers Overall transfer level: Needs assistance Equipment used: Rolling walker (2 wheels) Transfers: Sit to/from Stand Sit to Stand: Min assist                Ambulation/Gait Ambulation/Gait assistance: Contact guard assist Gait Distance (Feet): 40 Feet Assistive device: Rolling walker (2 wheels) Gait Pattern/deviations: Step-to pattern, Decreased step length - right, Decreased step length - left, Decreased stride length Gait velocity: decr     General Gait Details: patient steady with use of RW. Limited by pain this am. RN notifed but her BP is soft, so she cannot have  additional pain meds.   Stairs             Wheelchair Mobility     Tilt Bed    Modified Rankin (Stroke Patients Only)       Balance Overall balance assessment: Modified Independent Sitting-balance support: Feet supported Sitting balance-Leahy Scale: Good     Standing balance support: Bilateral upper extremity supported, During functional activity, Reliant on assistive device for balance Standing balance-Leahy Scale: Good                              Cognition Arousal: Alert Behavior During Therapy: WFL for tasks assessed/performed Overall Cognitive Status: Within Functional Limits for tasks assessed                                          Exercises Total Joint Exercises Ankle Circles/Pumps: AROM, Both, 10 reps Hip ABduction/ADduction: AAROM, Left, 10 reps Straight Leg Raises: AAROM, Left, 5 reps Long Arc Quad: AROM, Left, 10 reps Goniometric ROM: 0-80    General Comments        Pertinent Vitals/Pain Pain Assessment Pain Assessment: 0-10 Pain Score: 9  Pain Location: L knee Pain Descriptors / Indicators: Discomfort, Grimacing, Sore Pain Intervention(s): Monitored during session, Repositioned, Patient requesting pain meds-RN notified, Ice applied    Home Living  Prior Function            PT Goals (current goals can now be found in the care plan section) Acute Rehab PT Goals Patient Stated Goal: to return home with help from son PT Goal Formulation: With patient Time For Goal Achievement: 06/17/23 Potential to Achieve Goals: Good Progress towards PT goals: Progressing toward goals    Frequency    BID      PT Plan      Co-evaluation              AM-PAC PT "6 Clicks" Mobility   Outcome Measure  Help needed turning from your back to your side while in a flat bed without using bedrails?: A Little Help needed moving from lying on your back to sitting on the side of a  flat bed without using bedrails?: A Little Help needed moving to and from a bed to a chair (including a wheelchair)?: A Little Help needed standing up from a chair using your arms (e.g., wheelchair or bedside chair)?: A Little Help needed to walk in hospital room?: A Little Help needed climbing 3-5 steps with a railing? : A Lot 6 Click Score: 17    End of Session Equipment Utilized During Treatment: Gait belt Activity Tolerance: Patient limited by pain Patient left: in chair;with call bell/phone within reach Nurse Communication: Mobility status PT Visit Diagnosis: Difficulty in walking, not elsewhere classified (R26.2);Pain Pain - Right/Left: Left Pain - part of body: Knee     Time: 1610-9604 PT Time Calculation (min) (ACUTE ONLY): 12 min  Charges:    $Gait Training: 8-22 mins PT General Charges $$ ACUTE PT VISIT: 1 Visit                     Jalonda Antigua, PT, GCS 06/11/23,9:33 AM

## 2023-06-11 NOTE — Progress Notes (Addendum)
Subjective: 1 Day Post-Op Procedure(s) (LRB): COMPUTER ASSISTED TOTAL KNEE ARTHROPLASTY (Left) Patient reports pain as moderate.   Patient seen in rounds with Dr. Ernest Pine. Patient is well, and has had no acute complaints or problems.  She denies any CP, SOB, fevers.  She does report having some chills last night as well as some nausea, no episodes of emesis. We we will continue to work with therapy today Plan is to go Home after hospital stay.  Objective: Vital signs in last 24 hours: Temp:  [97 F (36.1 C)-98.7 F (37.1 C)] 97.8 F (36.6 C) (01/23 0628) Pulse Rate:  [57-87] 66 (01/23 0628) Resp:  [13-33] 16 (01/23 0628) BP: (83-112)/(41-77) 106/60 (01/23 0628) SpO2:  [95 %-100 %] 98 % (01/23 0628)  Intake/Output from previous day:  Intake/Output Summary (Last 24 hours) at 06/11/2023 0838 Last data filed at 06/11/2023 0600 Gross per 24 hour  Intake 800 ml  Output 715 ml  Net 85 ml    Intake/Output this shift: No intake/output data recorded.  Labs: No results for input(s): "HGB" in the last 72 hours. No results for input(s): "WBC", "RBC", "HCT", "PLT" in the last 72 hours. No results for input(s): "NA", "K", "CL", "CO2", "BUN", "CREATININE", "GLUCOSE", "CALCIUM" in the last 72 hours. No results for input(s): "LABPT", "INR" in the last 72 hours.  EXAM General - Patient is Alert, Appropriate, and Oriented Extremity - Neurologically intact ABD soft Neurovascular intact Sensation intact distally Intact pulses distally Dorsiflexion/Plantar flexion intact No cellulitis present Compartment soft Dressing - dressing C/D/I and no drainage Motor Function - intact, moving foot and toes well on exam.  Able to plantar and dorsiflex with good strength and range of motion.  Neurovascular intact all dermatomes extending down her left lower extremity.  Posterior tibial pulses appreciated. JP Drain pulled without difficulty. Intact  Past Medical History:  Diagnosis Date   Arthritis     Bipolar 1 disorder (HCC)    Cataracts, bilateral    Chronic kidney disease    stage 3   Chronic kidney disease    Chronic mitral valve disease    Coronary artery disease    Deviated nasal septum    Erosive esophagitis    Erosive esophagitis    Esophageal stricture    Fibroid tumor    GERD (gastroesophageal reflux disease)    History of hiatal hernia    Hypertension    Hypothyroidism    Murmur, cardiac    Nephrogenic diabetes insipidus (HCC)    Pure hypercholesterolemia    Renal cyst, left    bilateral   Seasonal allergies     Assessment/Plan: 1 Day Post-Op Procedure(s) (LRB): COMPUTER ASSISTED TOTAL KNEE ARTHROPLASTY (Left) Principal Problem:   History of total knee arthroplasty, left  Estimated body mass index is 26.48 kg/m as calculated from the following:   Height as of this encounter: 5\' 7"  (1.702 m).   Weight as of this encounter: 76.7 kg. Advance diet Up with therapy  Patient will continue to work with physical therapy to pass postoperative PT protocols, ROM and strengthening  Discussed with the patient continuing to utilize Polar Care  Patient will use bone foam in 20-30 minute intervals  Patient will wear TED hose bilaterally to help prevent DVT and clot formation  Discussed the Aquacel bandage.  This bandage will stay in place 7 days postoperatively.  Can be replaced with honeycomb bandages that will be sent home with the patient  Discussed sending the patient home with tramadol and oxycodone for  as needed pain management. Patient will take an 81 mg aspirin twice daily for DVT prophylaxis  JP drain removed without difficulty, intact  Weight-Bearing as tolerated to left leg  Patient will follow-up with [facility] in 2 weeks for staple removal and reevaluation  Rayburn Go, PA-C [facility] 06/11/2023, 8:38 AM  ADDENDUM: Patient actually complains of a "choking" sensation more than nausea. She has had this  sensation for quite a while. Remote history of esophageal dilation (?). I have ordered bedside swallowing study.  Sarabeth Benton P. Angie Fava M.D.

## 2023-06-11 NOTE — Anesthesia Postprocedure Evaluation (Signed)
Anesthesia Post Note  Patient: Stephanie Anthony  Procedure(s) Performed: COMPUTER ASSISTED TOTAL KNEE ARTHROPLASTY (Left: Knee)  Patient location during evaluation: Nursing Unit Anesthesia Type: Spinal Level of consciousness: oriented and awake and alert Pain management: pain level controlled Vital Signs Assessment: post-procedure vital signs reviewed and stable Respiratory status: spontaneous breathing and respiratory function stable Cardiovascular status: blood pressure returned to baseline and stable Postop Assessment: no headache, no backache, no apparent nausea or vomiting and patient able to bend at knees Anesthetic complications: no   No notable events documented.   Last Vitals:  Vitals:   06/11/23 0300 06/11/23 0628  BP: (!) 100/44 106/60  Pulse: 60 66  Resp: 16 16  Temp: 36.5 C 36.6 C  SpO2: 99% 98%    Last Pain:  Vitals:   06/11/23 0628  TempSrc: Temporal  PainSc:                  Rosanne Gutting

## 2023-06-11 NOTE — TOC Initial Note (Signed)
Transition of Care [facility]) - Initial/Assessment Note    Patient Details  Name: Stephanie Anthony MRN: [id number] Date of Birth: Sep 04, 1933  Transition of Care [facility] CM/SW Contact:    Marlowe Sax, RN Phone Number: 06/11/2023, 9:45 AM  Clinical Narrative:                 The patient is from home alone, her son will be there to help once she is discharged She is set up with [facility] for [facility] prior to admission by surgeons office RW will be delivered to the bedside by [employer]  Expected Discharge Plan: Home w Home Health Services Barriers to Discharge: Barriers Resolved   Patient Goals and CMS Choice            Expected Discharge Plan and Services   Discharge Planning Services: CM Consult   Living arrangements for the past 2 months: Single Family Home Expected Discharge Date: 06/11/23               DME Arranged: Dan Humphreys rolling DME Agency: [facility] Date DME Agency Contacted: 06/11/23 Time DME Agency Contacted: [phone] Representative spoke with at DME Agency: Cletis [place] HH Arranged: PT, OT HH Agency: [facility] Date [facility] Agency Contacted: 06/11/23 Time HH Agency Contacted: [phone] Representative spoke with at [facility] Agency: [place]  Prior Living Arrangements/Services Living arrangements for the past 2 months: Single Family Home Lives with:: Self (son is here to help her after she gets discharged home) Patient language and need for interpreter reviewed:: Yes Do you feel safe going back to the place where you live?: Yes      Need for Family Participation in Patient Care: Yes (Comment) Care giver support system in place?: Yes (comment)   Criminal Activity/Legal Involvement Pertinent to Current Situation/Hospitalization: No - Comment as needed  Activities of Daily Living   ADL Screening (condition at time of admission) Independently performs ADLs?: Yes (appropriate for developmental age) Is the patient deaf or have difficulty hearing?: No Does the patient have  difficulty seeing, even when wearing glasses/contacts?: No Does the patient have difficulty concentrating, remembering, or making decisions?: No  Permission Sought/Granted   Permission granted to share information with : Yes, Verbal Permission Granted              Emotional Assessment Appearance:: Appears stated age Attitude/Demeanor/Rapport: Engaged Affect (typically observed): Pleasant Orientation: : Oriented to Self, Oriented to Place, Oriented to  Time, Oriented to Situation Alcohol / Substance Use: Not Applicable Psych Involvement: No (comment)  Admission diagnosis:  History of total knee arthroplasty, left [Q46.962] Patient Active Problem List   Diagnosis Date Noted   History of total knee arthroplasty, left 06/10/2023   Primary osteoarthritis of left knee 04/05/2023   History of glaucoma 08/05/2022   Seasonal allergies 07/31/2021   History of 2019 novel coronavirus disease (COVID-19) 01/28/2021   BPPV (benign paroxysmal positional vertigo), left 02/05/2020   Near syncope 02/04/2020   Gastroesophageal reflux disease without esophagitis 10/27/2019   Resting tremor 09/26/2019   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   Proteinuria 02/25/2019   Mass of appendix 06/21/2018   AKI (acute kidney injury) (HCC) 07/11/2015   Anorexia 07/09/2015   Status post total right knee replacement 06/22/2015   Severe bipolar I disorder with depression (HCC) 11/16/2014   Enteritis 10/17/2014   Gastroenteritis, non-infectious 10/17/2014   Chronic kidney disease (CKD), stage III (moderate) (HCC) 09/27/2014   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   H/O nutritional disorder 09/27/2014   HLD (hyperlipidemia) 09/27/2014  Heart valve incompetence 09/27/2014   Mitral and aortic incompetence 09/27/2014   Personal history of other endocrine, nutritional and metabolic disease 16/02/9603   Hypothyroidism, postop 09/27/2014   Bipolar disorder, current episode depressed, severe,  without psychotic features (HCC)    Bipolar affective disorder, current episode depressed (HCC) 09/25/2014   Bipolar I disorder, most recent episode depressed (HCC) 09/25/2014   Chronic kidney disease, stage IV (severe) (HCC) 09/19/2014   Pure hypercholesterolemia 09/19/2014   Acquired hypothyroidism 03/29/2014   Bipolar affective disorder (HCC) 03/29/2014   Bradycardia 03/29/2014   Essential (primary) hypertension 03/29/2014   MI (mitral incompetence) 03/29/2014   Acquired cyst of kidney 06/18/2012   PCP:  Marisue Ivan, MD Pharmacy:   [facility] [phone] - [place], [place] - [address] AT [facility] OF [address] [place] [address] Phone: [phone] Fax: [phone]     Social Drivers of Health (SDOH) Social History: SDOH Screenings   Food Insecurity: No Food Insecurity (06/10/2023)  Housing: Unknown (06/10/2023)  Transportation Needs: No Transportation Needs (06/10/2023)  Utilities: Not At Risk (06/10/2023)  Depression (PHQ2-9): Low Risk  (06/01/2023)  Financial Resource Strain: Low Risk  (08/05/2022)   [facility]  Social Connections: Moderately Integrated (06/10/2023)  Tobacco Use: Medium Risk (06/10/2023)   SDOH Interventions:     Readmission Risk Interventions     No data to display

## 2023-06-11 NOTE — Evaluation (Signed)
Occupational Therapy Evaluation Patient Details Name: Stephanie Anthony MRN: [id number] DOB: [birth year] Today's Date: 06/11/2023   History of Present Illness Patient is an 89 year old female s/p L TKA. History of R TKA 13 years ago.   Clinical Impression   Pt seen for OT evaluation this date, POD#1 from above surgery. Pt is alert and oriented x4, slightly increased time for processing. PTA pt is generally MOD I in ADL (takes sink baths)/IADL except has assist for driving. Pt provided education via demo and handout re: polar care mgt, falls prevention strategies, home/routines modifications, DME/AE for LB bathing and dressing tasks, and compression stocking mgt. Pt would benefit from skilled OT services including additional instruction in dressing techniques with or without assistive devices for dressing and bathing skills to support recall and carryover prior to discharge and ultimately to maximize safety, independence, and minimize falls risk and caregiver burden. OT will continue to follow.       If plan is discharge home, recommend the following: A little help with walking and/or transfers;A little help with bathing/dressing/bathroom;Help with stairs or ramp for entrance;Assistance with cooking/housework    Functional Status Assessment  Patient has had a recent decline in their functional status and demonstrates the ability to make significant improvements in function in a reasonable and predictable amount of time.  Equipment Recommendations  BSC/3in1    Recommendations for Other Services       Precautions / Restrictions Precautions Precautions: Fall Restrictions Weight Bearing Restrictions Per Provider Order: Yes LLE Weight Bearing Per Provider Order: Weight bearing as tolerated      Mobility Bed Mobility Overal bed mobility: Needs Assistance Bed Mobility: Supine to Sit     Supine to sit: Supervision          Transfers Overall transfer level: Needs  assistance Equipment used: Rolling walker (2 wheels) Transfers: Sit to/from Stand Sit to Stand: Contact guard assist                  Balance Overall balance assessment: Needs assistance Sitting-balance support: Feet supported Sitting balance-Leahy Scale: Good     Standing balance support: Bilateral upper extremity supported, During functional activity, Reliant on assistive device for balance Standing balance-Leahy Scale: Good                             ADL either performed or assessed with clinical judgement   ADL Overall ADL's : Needs assistance/impaired Eating/Feeding: Supervision/ safety;Sitting   Grooming: Standing;Supervision/safety Grooming Details (indicate cue type and reason): with RW at sink level             Lower Body Dressing: Moderate assistance;Sitting/lateral leans Lower Body Dressing Details (indicate cue type and reason): socks, discussed DME use; pt reports son will help her Toilet Transfer: Supervision/safety;Contact guard assist;Rolling walker (2 wheels);BSC/3in1   Toileting- Clothing Manipulation and Hygiene: Supervision/safety;Sitting/lateral lean Toileting - Clothing Manipulation Details (indicate cue type and reason): continent of urine on toilet     Functional mobility during ADLs: Supervision/safety;Contact guard assist;Rolling walker (2 wheels) (approx 15'  in room two attempts)       Vision Patient Visual Report: No change from baseline       Perception         Praxis         Pertinent Vitals/Pain Pain Assessment Pain Assessment: 0-10 Pain Score: 8  Pain Location: L knee Pain Descriptors / Indicators: Discomfort, Grimacing, Sore Pain Intervention(s): Monitored during session,  Limited activity within patient's tolerance, Premedicated before session, Repositioned     Extremity/Trunk Assessment Upper Extremity Assessment Upper Extremity Assessment: Overall WFL for tasks assessed   Lower Extremity  Assessment Lower Extremity Assessment: LLE deficits/detail;Generalized weakness LLE Deficits / Details: s/p L TKA   Cervical / Trunk Assessment Cervical / Trunk Assessment: Normal   Communication Communication Communication: No apparent difficulties Cueing Techniques: Verbal cues   Cognition Arousal: Alert Behavior During Therapy: WFL for tasks assessed/performed Overall Cognitive Status: No family/caregiver present to determine baseline cognitive functioning Area of Impairment: Problem solving                             Problem Solving: Slow processing       General Comments  vss throughout    Exercises     Shoulder Instructions      Home Living Family/patient expects to be discharged to:: Private residence Living Arrangements: Alone Available Help at Discharge: Family;Available 24 hours/day (reports son is staying for approx 2 weeks after discharge) Type of Home: House Home Access: Stairs to enter [employer] of Steps: 4 Entrance Stairs-Rails: Right;Left;Can reach both Home Layout: One level     Bathroom Shower/Tub: Sponge bathes at baseline   Bathroom Toilet: Handicapped height Bathroom Accessibility: Yes   Home Equipment: None   Additional Comments: pt reports no equipment      Prior Functioning/Environment Prior Level of Function : Independent/Modified Independent             Mobility Comments: amb with no AD ADLs Comments: pt reports generally MOD I in ADL/IADL except she does not drive, son will come every few weeks (lives in [place]) to assist        OT Problem List: Decreased strength;Decreased activity tolerance;Impaired balance (sitting and/or standing);Decreased knowledge of use of DME or AE      OT Treatment/Interventions: Self-care/ADL training;DME and/or AE instruction;Therapeutic activities;Balance training;Therapeutic exercise;Patient/family education;Energy conservation    OT Goals(Current goals can be  found in the care plan section) Acute Rehab OT Goals Patient Stated Goal: return to PLOF OT Goal Formulation: With patient Time For Goal Achievement: 06/25/23 Potential to Achieve Goals: Good ADL Goals Pt Will Perform Grooming: with modified independence Pt Will Perform Lower Body Dressing: with modified independence;sitting/lateral leans;sit to/from stand Pt Will Transfer to Toilet: with modified independence;ambulating Pt Will Perform Toileting - Clothing Manipulation and hygiene: with modified independence;sitting/lateral leans;sit to/from stand  OT Frequency: Min 1X/week    Co-evaluation              AM-PAC OT "6 Clicks" Daily Activity     Outcome Measure Help from another person eating meals?: None Help from another person taking care of personal grooming?: None Help from another person toileting, which includes using toliet, bedpan, or urinal?: None Help from another person bathing (including washing, rinsing, drying)?: A Little Help from another person to put on and taking off regular upper body clothing?: None Help from another person to put on and taking off regular lower body clothing?: A Lot 6 Click Score: 21   End of Session Equipment Utilized During Treatment: Rolling walker (2 wheels);Gait belt Nurse Communication: Mobility status  Activity Tolerance: Patient tolerated treatment well Patient left: Other (comment) (standing in room in care of PT for hand off)  OT Visit Diagnosis: Other abnormalities of gait and mobility (R26.89);Unsteadiness on feet (R26.81);Muscle weakness (generalized) (M62.81)  Time: 4696-2952 OT Time Calculation (min): 18 min Charges:  OT General Charges $OT Visit: 1 Visit OT Evaluation $OT Eval Low Complexity: 1 Low  Oleta Mouse, OTD OTR/L  06/11/23, 10:28 AM

## 2023-06-11 NOTE — Care Management Obs Status (Signed)
MEDICARE OBSERVATION STATUS NOTIFICATION   Patient Details  Name: Stephanie Anthony MRN: [id number] Date of Birth: Nov 01, 1933   Medicare Observation Status Notification Given:  Yes    Sherilyn Banker 06/11/2023, 10:08 AM

## 2023-06-12 DIAGNOSIS — M1712 Unilateral primary osteoarthritis, left knee: Secondary | ICD-10-CM | POA: Diagnosis not present

## 2023-06-12 MED ORDER — ACETAMINOPHEN 500 MG PO TABS
ORAL_TABLET | ORAL | Status: AC
  Filled 2023-06-12: qty 2

## 2023-06-12 MED ORDER — SENNOSIDES-DOCUSATE SODIUM 8.6-50 MG PO TABS
ORAL_TABLET | ORAL | Status: AC
  Filled 2023-06-12: qty 1

## 2023-06-12 MED ORDER — METOCLOPRAMIDE HCL 10 MG PO TABS
ORAL_TABLET | ORAL | Status: AC
  Filled 2023-06-12: qty 1

## 2023-06-12 MED ORDER — ONDANSETRON HCL 4 MG PO TABS: 4.0000 mg | ORAL_TABLET | Freq: Every day | ORAL | 1 refills | Status: DC | PRN

## 2023-06-12 MED ORDER — FERROUS SULFATE 325 (65 FE) MG PO TABS
ORAL_TABLET | ORAL | Status: AC
  Filled 2023-06-12: qty 1

## 2023-06-12 MED ORDER — ACETAMINOPHEN 325 MG PO TABS
ORAL_TABLET | ORAL | Status: AC
Start: 2023-06-12 — End: ?
  Filled 2023-06-12: qty 2

## 2023-06-12 MED ORDER — OXYCODONE HCL 5 MG PO TABS
ORAL_TABLET | ORAL | Status: AC
  Filled 2023-06-12: qty 2

## 2023-06-12 MED ORDER — ONDANSETRON HCL 4 MG PO TABS
ORAL_TABLET | ORAL | Status: AC
  Filled 2023-06-12: qty 1

## 2023-06-12 MED ORDER — PANTOPRAZOLE SODIUM 40 MG PO TBEC
DELAYED_RELEASE_TABLET | ORAL | Status: AC
  Filled 2023-06-12: qty 1

## 2023-06-12 MED ORDER — ASPIRIN 81 MG PO CHEW
CHEWABLE_TABLET | ORAL | Status: AC
  Filled 2023-06-12: qty 1

## 2023-06-12 MED ORDER — TRAMADOL HCL 50 MG PO TABS
ORAL_TABLET | ORAL | Status: AC
  Filled 2023-06-12: qty 1

## 2023-06-12 MED ORDER — LOSARTAN POTASSIUM 50 MG PO TABS
ORAL_TABLET | ORAL | Status: AC
Start: 2023-06-12 — End: ?
  Filled 2023-06-12: qty 1

## 2023-06-12 MED ORDER — MAGNESIUM HYDROXIDE 400 MG/5ML PO SUSP
ORAL | Status: AC
  Filled 2023-06-12: qty 30

## 2023-06-12 NOTE — Progress Notes (Signed)
Physical Therapy Treatment Patient Details Name: Una Yeomans MRN: [id number] DOB: [birth year] Today's Date: 06/12/2023   History of Present Illness Patient is an 89 year old female s/p L TKA. History of R TKA 13 years ago.    PT Comments  Patient received in bed, she reports significant pain just lying in bed. (9/10) RN reports patient received tramadol around 8:30. Patient is agreeable to PT session. She is mod I with bed mobility and sit to stand transfer. Cues needed for safety. Performed LE strengthening and ROM exercises independently with cues. Patient ambulated ~75 feet with RW, cga/supervision and chair follow for safety. She ambulated up/down 4 steps with B rails and supervision. She moves well overall, despite high pain rating. Patient will continue to benefit from skilled PT to improve independence and strength.      If plan is discharge home, recommend the following: Assist for transportation;Help with stairs or ramp for entrance;Direct supervision/assist for financial management;Assistance with cooking/housework;A little help with walking and/or transfers;A little help with bathing/dressing/bathroom   Can travel by private vehicle      yes  Equipment Recommendations  Rolling walker (2 wheels);BSC/3in1    Recommendations for Other Services       Precautions / Restrictions Precautions Precautions: Fall Restrictions Weight Bearing Restrictions Per Provider Order: Yes LLE Weight Bearing Per Provider Order: Weight bearing as tolerated     Mobility  Bed Mobility Overal bed mobility: Modified Independent Bed Mobility: Supine to Sit     Supine to sit: Modified independent (Device/Increase time)          Transfers Overall transfer level: Needs assistance Equipment used: Rolling walker (2 wheels) Transfers: Sit to/from Stand Sit to Stand: Supervision           General transfer comment: cues for safety    Ambulation/Gait Ambulation/Gait assistance:  Supervision Gait Distance (Feet): 75 Feet Assistive device: Rolling walker (2 wheels) Gait Pattern/deviations: Step-to pattern, Decreased step length - right, Decreased step length - left, Decreased stride length, Decreased weight shift to left Gait velocity: decr     General Gait Details: steady, reports mild dizziness, pain limited   Stairs Stairs: Yes Stairs assistance: Supervision Stair Management: Step to pattern, Forwards, Two rails Number of Stairs: 4 General stair comments: generally safe, increased effort due to pain   Wheelchair Mobility     Tilt Bed    Modified Rankin (Stroke Patients Only)       Balance Overall balance assessment: Modified Independent Sitting-balance support: Feet supported   Sitting balance - Comments: mod I bed mobility   Standing balance support: Bilateral upper extremity supported, During functional activity, Reliant on assistive device for balance Standing balance-Leahy Scale: Good                              Cognition Arousal: Alert Behavior During Therapy: WFL for tasks assessed/performed Overall Cognitive Status: Within Functional Limits for tasks assessed Area of Impairment: Problem solving                             Problem Solving: Slow processing, Requires verbal cues          Exercises Total Joint Exercises Ankle Circles/Pumps: AAROM, Left, 10 reps Quad Sets: AROM, Left, 10 reps Heel Slides: AROM, Left, 10 reps Hip ABduction/ADduction: AROM, Left, 10 reps Straight Leg Raises: AROM, Left, 10 reps Long Arc Quad: AROM, Left, 10  reps Goniometric ROM: 0-90    General Comments        Pertinent Vitals/Pain Pain Assessment Pain Assessment: 0-10 Pain Score: 10-Worst pain ever Pain Location: L knee Pain Descriptors / Indicators: Discomfort, Grimacing, Sore Pain Intervention(s): Monitored during session, Repositioned, Premedicated before session, Ice applied    Home Living                           Prior Function            PT Goals (current goals can now be found in the care plan section) Acute Rehab PT Goals Patient Stated Goal: to return home with help from son PT Goal Formulation: With patient Time For Goal Achievement: 06/17/23 Potential to Achieve Goals: Good Progress towards PT goals: Progressing toward goals    Frequency    BID      PT Plan      Co-evaluation              AM-PAC PT "6 Clicks" Mobility   Outcome Measure  Help needed turning from your back to your side while in a flat bed without using bedrails?: None Help needed moving from lying on your back to sitting on the side of a flat bed without using bedrails?: None Help needed moving to and from a bed to a chair (including a wheelchair)?: A Little Help needed standing up from a chair using your arms (e.g., wheelchair or bedside chair)?: None Help needed to walk in hospital room?: A Little Help needed climbing 3-5 steps with a railing? : A Little 6 Click Score: 21    End of Session Equipment Utilized During Treatment: Gait belt Activity Tolerance: Patient limited by pain Patient left: in chair;with call bell/phone within reach Nurse Communication: Mobility status;Other (comment) (pain limited) PT Visit Diagnosis: Pain;Difficulty in walking, not elsewhere classified (R26.2) Pain - Right/Left: Left Pain - part of body: Knee     Time: 0852-0921 PT Time Calculation (min) (ACUTE ONLY): 29 min  Charges:    $Gait Training: 8-22 mins $Therapeutic Exercise: 8-22 mins PT General Charges $$ ACUTE PT VISIT: 1 Visit                     Kenzey Birkland, PT, GCS 06/12/23,9:37 AM

## 2023-06-12 NOTE — Progress Notes (Signed)
Occupational Therapy Treatment Patient Details Name: Stephanie Anthony MRN: [id number] DOB: Jan 23, 1934 Today's Date: 06/12/2023   History of present illness Patient is an 89 year old female s/p L TKA. History of R TKA 13 years ago.   OT comments  Chart reviewed, pt in bed with lights off, agreeable to OT tx session targeting improved ADL performance following surgery. Pt reports nausea throughout, intermittent dizziness; BP in semi supine 113/76, after mobility 143/70. Pt does endorse 8/10 pain with mobility despite being pre medicated. Pt reports son will help her with LB as she continues to require MOD A for LB dressing with AE. Pt amb in room with RW with supervision, grooming tasks at sink with supervision. Pt is left in bedside chair, educated on delirium precautions and to remain in chair as she can tolerate, continue to progress  mobility. Nurse notified of findings. OT will continue to follow.       If plan is discharge home, recommend the following:  A little help with walking and/or transfers;A little help with bathing/dressing/bathroom;Help with stairs or ramp for entrance;Assistance with cooking/housework   Equipment Recommendations  BSC/3in1    Recommendations for Other Services      Precautions / Restrictions Precautions Precautions: Fall Restrictions Weight Bearing Restrictions Per Provider Order: Yes LLE Weight Bearing Per Provider Order: Weight bearing as tolerated       Mobility Bed Mobility Overal bed mobility: Modified Independent                  Transfers Overall transfer level: Needs assistance Equipment used: Rolling walker (2 wheels) Transfers: Sit to/from Stand Sit to Stand: Supervision                 Balance Overall balance assessment: Modified Independent Sitting-balance support: Feet supported Sitting balance-Leahy Scale: Good     Standing balance support: Bilateral upper extremity supported, During functional activity, Rel[place]  on assistive device for balance Standing balance-Leahy Scale: Good                             ADL either performed or assessed with clinical judgement   ADL Overall ADL's : Needs assistance/impaired Eating/Feeding: Supervision/ safety;Sitting   Grooming: Supervision/safety;Standing Grooming Details (indicate cue type and reason): with RW at sink level             Lower Body Dressing: Moderate assistance;Sitting/lateral leans Lower Body Dressing Details (indicate cue type and reason): use of reacher to donn underwear Toilet Transfer: Supervision/safety;Rolling walker (2 wheels);BSC/3in1 Toilet Transfer Details (indicate cue type and reason): bsc over toilet, intermittent vcs for technique Toileting- Clothing Manipulation and Hygiene: Supervision/safety;Sitting/lateral lean       Functional mobility during ADLs: Supervision/safety;Contact guard assist;Rolling walker (2 wheels) (approx 25' in room with RW)      Extremity/Trunk Assessment              Vision       Perception     Praxis      Cognition Arousal: Alert Behavior During Therapy: WFL for tasks assessed/performed Overall Cognitive Status: No family/caregiver present to determine baseline cognitive functioning Area of Impairment: Following commands                             Problem Solving: Slow processing          Exercises Other Exercises Other Exercises: edu re: delerium precaution management, LB dressing  with AE, safe ADL completion    Shoulder Instructions       General Comments vss throughout    Pertinent Vitals/ Pain       Pain Assessment Pain Assessment: 0-10 Pain Score: 8  Pain Location: L knee Pain Descriptors / Indicators: Discomfort, Grimacing, Sore Pain Intervention(s): Premedicated before session, Monitored during session, Repositioned, Limited activity within patient's tolerance, Ice applied  Home Living                                           Prior Functioning/Environment              Frequency  Min 1X/week        Progress Toward Goals  OT Goals(current goals can now be found in the care plan section)  Progress towards OT goals: Progressing toward goals  Acute Rehab OT Goals Time For Goal Achievement: 06/25/23  Plan      Co-evaluation                 AM-PAC OT "6 Clicks" Daily Activity     Outcome Measure   Help from another person eating meals?: None Help from another person taking care of personal grooming?: None Help from another person toileting, which includes using toliet, bedpan, or urinal?: None Help from another person bathing (including washing, rinsing, drying)?: A Little Help from another person to put on and taking off regular upper body clothing?: None Help from another person to put on and taking off regular lower body clothing?: A Lot 6 Click Score: 21    End of Session Equipment Utilized During Treatment: Rolling walker (2 wheels);Gait belt  OT Visit Diagnosis: Other abnormalities of gait and mobility (R26.89);Unsteadiness on feet (R26.81);Muscle weakness (generalized) (M62.81)   Activity Tolerance Patient tolerated treatment well   Patient Left in chair;with call bell/phone within reach   Nurse Communication Mobility status        Time: 0981-1914 OT Time Calculation (min): 23 min  Charges: OT General Charges $OT Visit: 1 Visit OT Treatments $Self Care/Home Management : 23-37 mins  Oleta Mouse, OTD OTR/L  06/12/23, 12:48 PM

## 2023-06-12 NOTE — Discharge Summary (Signed)
Physician Discharge Summary  Subjective: 2 Days Post-Op Procedure(s) (LRB): COMPUTER ASSISTED TOTAL KNEE ARTHROPLASTY (Left) Patient reports pain as moderate to increased especially with ambulation.   Patient seen in rounds with Dr. Ernest Pine. Patient is well, and has had no acute complaints, just increased pain.  She denies any CP, SOB, fevers.  Had a swallowing study performed with speech therapy yesterday to evaluated for nausea/choking episodes. Encouraged her to follow up with GI doctor. Patient is ready to go home.  Physician Discharge Summary  Patient ID: Stephanie Anthony MRN: [id number] DOB/AGE: Apr 09, 1934 89 y.o.  Admit date: 06/10/2023 Discharge date: 06/12/2023  Admission Diagnoses:  Discharge Diagnoses:  Principal Problem:   History of total knee arthroplasty, left   Discharged Condition: fair  Hospital Course: Patient presented to the hospital on 06/10/2023 for an elective left total knee arthroplasty performed by Dr. Ernest Pine. Patient was given 1g of TXA and 2g of Ancef prior to the procedure. she tolerated the procedure well without any complications. See procedural note below for details. Postoperatively, the patient did okay. she had some difficulty passing her postoperative PT protocols, but on postop day 2 was able to get up and walk approximately 75 feet and perform stairs.  She does report increased discomfort at rest and especially with ambulation.   JP drain was removed without any difficulty on postop day 1 and was intact. she was able to void her bladder without any difficulty.  She has not moved her bowels.  Of note, patient also had some difficulty with swallowing and reported nausea/choking episodes.  Speech therapy was consulted and she was found to have some difficulty with swallowing, would benefit from being placed on a thin and soft/easy to chew diet.  Physical exam was unremarkable. she denies any SOB, CP, emesis, fevers or chills. Vital signs are stable. Patient  is stable to discharge home.  PROCEDURE:  Left total knee arthroplasty using computer-assisted navigation   SURGEON:  Jena Gauss. M.D.   ASSISTANT:  Gean Birchwood, PA-C (present and scrubbed throughout the case, critical for assistance with exposure, retraction, instrumentation, and closure)   ANESTHESIA: spinal   ESTIMATED BLOOD LOSS: 50 mL   FLUIDS REPLACED: 400 mL of crystalloid   TOURNIQUET TIME: 77 minutes   DRAINS: 2 medium Hemovac drains   SOFT TISSUE RELEASES: Anterior cruciate ligament, posterior cruciate ligament, deep medial collateral ligament, patellofemoral ligament   IMPLANTS UTILIZED: DePuy Attune size 6N posterior stabilized femoral component (cemented), size 5 rotating platform tibial component (cemented), 35 mm medialized dome patella (cemented), and a 5 mm stabilized rotating platform polyethylene insert.    Treatments: Soft and thin diet  Discharge Exam: Blood pressure (!) 136/57, pulse 73, temperature 98.6 F (37 C), temperature source Oral, resp. rate 16, height 5\' 7"  (1.702 m), weight 76.7 kg, SpO2 98%.   Disposition: home   Allergies as of 06/12/2023       Reactions   Aspirin Other (See Comments)   Rectal bleeding   Codeine Nausea And Vomiting   Prednisone Other (See Comments)   Hallucinations   Valium [diazepam] Other (See Comments)   Strange feelings        Medication List     TAKE these medications    aspirin 81 MG chewable tablet Chew 1 tablet (81 mg total) by mouth 2 (two) times daily.   lamoTRIgine 100 MG tablet Commonly known as: LAMICTAL Take 1 tablet (100 mg total) by mouth at bedtime. Start taking on: July 08, 2023  latanoprost 0.005 % ophthalmic solution Commonly known as: XALATAN Place 1 drop into both eyes at bedtime.   levothyroxine 100 MCG tablet Commonly known as: SYNTHROID Take 100 mcg by mouth daily before breakfast.   losartan 25 MG tablet Commonly known as: COZAAR Take 25 mg by mouth  daily.   lovastatin 40 MG tablet Commonly known as: MEVACOR Take 40 mg by mouth at bedtime.   ondansetron 4 MG tablet Commonly known as: Zofran Take 1 tablet (4 mg total) by mouth daily as needed for nausea or vomiting.   oxyCODONE 5 MG immediate release tablet Commonly known as: Oxy IR/ROXICODONE Take 1 tablet (5 mg total) by mouth every 4 (four) hours as needed for moderate pain (pain score 4-6) (pain score 4-6).   pantoprazole 40 MG tablet Commonly known as: PROTONIX Take 40 mg by mouth daily.   traMADol 50 MG tablet Commonly known as: ULTRAM Take 1-2 tablets (50-100 mg total) by mouth every 4 (four) hours as needed for moderate pain (pain score 4-6).               Durable Medical Equipment  (From admission, onward)           Start     Ordered   06/10/23 1103  DME Walker rolling  Once       Question:  Patient needs a walker to treat with the following condition  Answer:  Total knee replacement status   06/10/23 1102   06/10/23 1103  DME Bedside commode  Once       Comments: Patient is not able to walk the distance required to go the bathroom, or he/she is unable to safely negotiate stairs required to access the bathroom.  A 3in1 BSC will alleviate this problem  Question:  Patient needs a bedside commode to treat with the following condition  Answer:  Total knee replacement status   06/10/23 1102            Follow-up Information     Rayburn Go, PA-C Follow up on 06/24/2023.   Specialty: Orthopedic Surgery Why: at 10:30am Contact information: [address] [place] [address] [phone]         Donato Heinz, MD Follow up on 07/23/2023.   Specialty: Orthopedic Surgery Why: at 2:45pm Contact information: [address] [facility] [place] [address] [phone]                 Signed: Gean Birchwood 06/12/2023, 12:11 PM   Objective: Vital signs in last 24 hours: Temp:  [97.8 F (36.6 C)-98.7 F  (37.1 C)] 98.6 F (37 C) (01/24 0756) Pulse Rate:  [53-73] 73 (01/24 0756) Resp:  [14-16] 16 (01/24 0756) BP: (103-144)/(48-68) 136/57 (01/24 0756) SpO2:  [96 %-100 %] 98 % (01/24 0756)  Intake/Output from previous day: No intake or output data in the 24 hours ending 06/12/23 1211  Intake/Output this shift: No intake/output data recorded.  Labs: No results for input(s): "HGB" in the last 72 hours. No results for input(s): "WBC", "RBC", "HCT", "PLT" in the last 72 hours. No results for input(s): "NA", "K", "CL", "CO2", "BUN", "CREATININE", "GLUCOSE", "CALCIUM" in the last 72 hours. No results for input(s): "LABPT", "INR" in the last 72 hours.  EXAM: General - Patient is Alert, Appropriate, and Oriented Extremity - Neurologically intact ABD soft Neurovascular intact Sensation intact distally Intact pulses distally Dorsiflexion/Plantar flexion intact No cellulitis present Compartment soft Dressing - dressing C/D/I and no drainage Motor Function -  intact, moving foot and toes well on exam.  Able to plantar and dorsiflex with good strength and range of motion.  Neurovascular intact all dermatomes extending down her left lower extremity.  Posterior tibial pulses appreciated. Drain site intact Assessment/Plan: 2 Days Post-Op Procedure(s) (LRB): COMPUTER ASSISTED TOTAL KNEE ARTHROPLASTY (Left) Procedure(s) (LRB): COMPUTER ASSISTED TOTAL KNEE ARTHROPLASTY (Left) Past Medical History:  Diagnosis Date   Arthritis    Bipolar 1 disorder (HCC)    Cataracts, bilateral    Chronic kidney disease    stage 3   Chronic kidney disease    Chronic mitral valve disease    Coronary artery disease    Deviated nasal septum    Erosive esophagitis    Erosive esophagitis    Esophageal stricture    Fibroid tumor    GERD (gastroesophageal reflux disease)    History of hiatal hernia    Hypertension    Hypothyroidism    Murmur, cardiac    Nephrogenic diabetes insipidus (HCC)    Pure  hypercholesterolemia    Renal cyst, left    bilateral   Seasonal allergies    Principal Problem:   History of total knee arthroplasty, left  Estimated body mass index is 26.48 kg/m as calculated from the following:   Height as of this encounter: 5\' 7"  (1.702 m).   Weight as of this encounter: 76.7 kg.  Swallow study was performed. Speech therapy concluded a thin and soft diet would most likely benefit the patient, order has been placed for this diet. Discussed following up with her GI doctor to be re-evaluated for this issue as she has a history significant for issues with this in the past as well as having a dilation performed.   Denies any SOB, continue to monitor for any postoperative pneumonia secondary to atelectasis or aspiration. Encouraged use of incentive spirometer every hour or two.   Patient will continue to work with physical therapy at home.   Discussed with the patient continuing to utilize Polar Care   Patient will use bone foam in 20-30 minute intervals   Patient will wear TED hose bilaterally to help prevent DVT and clot formation   Discussed the Aquacel bandage.  This bandage will stay in place 7 days postoperatively.  Can be replaced with honeycomb bandages that will be sent home with the patient   Discussed sending the patient home with tramadol and oxycodone for as needed pain management. Patient will take an 81 mg aspirin twice daily for DVT prophylaxis. Patient has been also sent home with zofran for as needed nausea symptoms   Drain site bandage intact   Weight-Bearing as tolerated to left leg   Patient will follow-up with [facility] orthopedics in 2 weeks for staple removal and reevaluation  Diet -thin and soft Follow up - in 2 weeks Activity - WBAT Disposition - Home Condition Upon Discharge - Stable and fair DVT Prophylaxis - Aspirin and TED hose  Danise Edge, PA-C Orthopaedic Surgery 06/12/2023, 12:11 PM

## 2023-06-12 NOTE — Plan of Care (Signed)
Documented

## 2023-06-12 NOTE — Progress Notes (Addendum)
Subjective: 2 Days Post-Op Procedure(s) (LRB): COMPUTER ASSISTED TOTAL KNEE ARTHROPLASTY (Left) Patient reports pain as moderate and very uncomfortable with any ambulation. States that pain medications seem to help, but pain returns after medications wear off.   Patient seen in rounds with Dr. Ernest Pine. Patient is well, and has had no acute complaints or problems.  She denies any CP, SOB, fevers.  Had a swallowing study performed with speech therapy yesterday to evaluated for nausea/choking episodes. Continue to work with PT today. Plan is to go Home after hospital stay.  Objective: Vital signs in last 24 hours: Temp:  [97.6 F (36.4 C)-98.7 F (37.1 C)] 98.7 F (37.1 C) (01/24 0400) Pulse Rate:  [53-73] 73 (01/24 0400) Resp:  [14-16] 14 (01/24 0400) BP: (103-144)/(48-68) 122/68 (01/24 0400) SpO2:  [96 %-100 %] 96 % (01/24 0400)  Intake/Output from previous day: No intake or output data in the 24 hours ending 06/12/23 0747   Intake/Output this shift: No intake/output data recorded.  Labs: No results for input(s): "HGB" in the last 72 hours. No results for input(s): "WBC", "RBC", "HCT", "PLT" in the last 72 hours. No results for input(s): "NA", "K", "CL", "CO2", "BUN", "CREATININE", "GLUCOSE", "CALCIUM" in the last 72 hours. No results for input(s): "LABPT", "INR" in the last 72 hours.  EXAM General - Patient is Alert, Appropriate, and Oriented Extremity - Neurologically intact ABD soft Neurovascular intact Sensation intact distally Intact pulses distally Dorsiflexion/Plantar flexion intact No cellulitis present Compartment soft Dressing - dressing C/D/I and no drainage Motor Function - intact, moving foot and toes well on exam.  Able to plantar and dorsiflex with good strength and range of motion.  Neurovascular intact all dermatomes extending down her left lower extremity.  Posterior tibial pulses appreciated. Drain site intact  Past Medical History:  Diagnosis Date    Arthritis    Bipolar 1 disorder (HCC)    Cataracts, bilateral    Chronic kidney disease    stage 3   Chronic kidney disease    Chronic mitral valve disease    Coronary artery disease    Deviated nasal septum    Erosive esophagitis    Erosive esophagitis    Esophageal stricture    Fibroid tumor    GERD (gastroesophageal reflux disease)    History of hiatal hernia    Hypertension    Hypothyroidism    Murmur, cardiac    Nephrogenic diabetes insipidus (HCC)    Pure hypercholesterolemia    Renal cyst, left    bilateral   Seasonal allergies     Assessment/Plan: 2 Days Post-Op Procedure(s) (LRB): COMPUTER ASSISTED TOTAL KNEE ARTHROPLASTY (Left) Principal Problem:   History of total knee arthroplasty, left  Estimated body mass index is 26.48 kg/m as calculated from the following:   Height as of this encounter: 5\' 7"  (1.702 m).   Weight as of this encounter: 76.7 kg. Advance diet Up with therapy  Swallow study was performed. Speech therapy concluded a thin and soft diet would most likely benefit the patient, order has been placed for this diet. Patient states she thinks the swallow study went well, but reported some difficulty with swallowing water this AM.  Denies any SOB, continue to monitor for any postoperative pneumonia secondary to atelectasis or aspiration. Encouraged use of incentive spirometer every hour or two.  Patient will continue to work with physical therapy to pass postoperative PT protocols, ROM and strengthening  Discussed with the patient continuing to utilize Polar Care  Patient will use bone  foam in 20-30 minute intervals  Patient will wear TED hose bilaterally to help prevent DVT and clot formation  Discussed the Aquacel bandage.  This bandage will stay in place 7 days postoperatively.  Can be replaced with honeycomb bandages that will be sent home with the patient  Discussed sending the patient home with tramadol and oxycodone for as needed pain  management. Patient will take an 81 mg aspirin twice daily for DVT prophylaxis  Drain site bandage intact  Weight-Bearing as tolerated to left leg  Patient will follow-up with [facility] orthopedics in 2 weeks for staple removal and reevaluation  Rayburn Go, PA-C [facility] Orthopaedics 06/12/2023, 7:47 AM

## 2023-06-29 ENCOUNTER — Encounter: Payer: Self-pay | Admitting: Emergency Medicine

## 2023-06-29 ENCOUNTER — Other Ambulatory Visit: Payer: Self-pay

## 2023-06-29 ENCOUNTER — Emergency Department
Admission: EM | Admit: 2023-06-29 | Discharge: 2023-06-29 | Disposition: A | Discharge status: Home / Self Care | Payer: Medicare Other | Attending: Emergency Medicine | Admitting: Emergency Medicine

## 2023-06-29 DIAGNOSIS — I2510 Atherosclerotic heart disease of native coronary artery without angina pectoris: Secondary | ICD-10-CM | POA: Diagnosis not present

## 2023-06-29 DIAGNOSIS — R112 Nausea with vomiting, unspecified: Secondary | ICD-10-CM | POA: Diagnosis not present

## 2023-06-29 DIAGNOSIS — I951 Orthostatic hypotension: Secondary | ICD-10-CM | POA: Insufficient documentation

## 2023-06-29 DIAGNOSIS — R42 Dizziness and giddiness: Secondary | ICD-10-CM

## 2023-06-29 DIAGNOSIS — E860 Dehydration: Secondary | ICD-10-CM | POA: Insufficient documentation

## 2023-06-29 DIAGNOSIS — Z20822 Contact with and (suspected) exposure to covid-19: Secondary | ICD-10-CM | POA: Insufficient documentation

## 2023-06-29 DIAGNOSIS — R531 Weakness: Secondary | ICD-10-CM | POA: Diagnosis present

## 2023-06-29 DIAGNOSIS — N184 Chronic kidney disease, stage 4 (severe): Secondary | ICD-10-CM | POA: Diagnosis not present

## 2023-06-29 LAB — CBC
HCT: 36 % (ref 36.0–46.0)
Hemoglobin: 11.8 g/dL — ABNORMAL LOW (ref 12.0–15.0)
MCH: 25.4 pg — ABNORMAL LOW (ref 26.0–34.0)
MCHC: 32.8 g/dL (ref 30.0–36.0)
MCV: 77.4 fL — ABNORMAL LOW (ref 80.0–100.0)
Platelets: 360 10*3/uL (ref 150–400)
RBC: 4.65 MIL/uL (ref 3.87–5.11)
RDW: 14.5 % (ref 11.5–15.5)
WBC: 6.7 10*3/uL (ref 4.0–10.5)
nRBC: 0 % (ref 0.0–0.2)

## 2023-06-29 LAB — URINALYSIS, ROUTINE W REFLEX MICROSCOPIC
Bilirubin Urine: NEGATIVE
Glucose, UA: NEGATIVE mg/dL
Ketones, ur: NEGATIVE mg/dL
Nitrite: NEGATIVE
Protein, ur: NEGATIVE mg/dL
Specific Gravity, Urine: 1.009 (ref 1.005–1.030)
pH: 6 (ref 5.0–8.0)

## 2023-06-29 LAB — HEPATIC FUNCTION PANEL
ALT: 9 U/L (ref 0–44)
AST: 18 U/L (ref 15–41)
Albumin: 3.5 g/dL (ref 3.5–5.0)
Alkaline Phosphatase: 54 U/L (ref 38–126)
Bilirubin, Direct: <0.1 mg/dL (ref 0.0–0.2)
Total Bilirubin: 0.4 mg/dL (ref 0.0–1.2)
Total Protein: 6.8 g/dL (ref 6.5–8.1)

## 2023-06-29 LAB — BASIC METABOLIC PANEL
Anion gap: 13 (ref 5–15)
BUN: 20 mg/dL (ref 8–23)
CO2: 22 mmol/L (ref 22–32)
Calcium: 9.2 mg/dL (ref 8.9–10.3)
Chloride: 106 mmol/L (ref 98–111)
Creatinine, Ser: 1.79 mg/dL — ABNORMAL HIGH (ref 0.44–1.00)
GFR, Estimated: 27 mL/min — ABNORMAL LOW (ref >60)
Glucose, Bld: 97 mg/dL (ref 70–99)
Potassium: 4.2 mmol/L (ref 3.5–5.1)
Sodium: 141 mmol/L (ref 135–145)

## 2023-06-29 LAB — LIPASE, BLOOD: Lipase: 52 U/L — ABNORMAL HIGH (ref 11–51)

## 2023-06-29 LAB — TROPONIN I (HIGH SENSITIVITY): Troponin I (High Sensitivity): 10 ng/L (ref <18)

## 2023-06-29 LAB — RESP PANEL BY RT-PCR (RSV, FLU A&B, COVID)  RVPGX2
Influenza A by PCR: NEGATIVE
Influenza B by PCR: NEGATIVE
Resp Syncytial Virus by PCR: NEGATIVE
SARS Coronavirus 2 by RT PCR: NEGATIVE

## 2023-06-29 MED ORDER — ONDANSETRON HCL 4 MG/2ML IJ SOLN
4.0000 mg | Freq: Once | INTRAMUSCULAR | Status: AC
  Administered 2023-06-29: 4 mg via INTRAVENOUS
  Filled 2023-06-29: qty 2

## 2023-06-29 MED ORDER — SODIUM CHLORIDE 0.9 % IV BOLUS
1000.0000 mL | Freq: Once | INTRAVENOUS | Status: AC
  Administered 2023-06-29: 1000 mL via INTRAVENOUS

## 2023-06-29 NOTE — ED Provider Notes (Signed)
-----------------------------------------   4:50 PM on 06/29/2023 -----------------------------------------  I took over care of this patient from Dr. Vicenta Graft.  LFTs and lipase were pending at the time of signout.  Lipase is at the borderline of normal.  LFTs are normal.  There is no indication for repeat troponin given the duration of the symptoms.  On reassessment the patient states she is feeling better after receiving most of the first liter of fluids.  At this time, the patient is stable for discharge.  She feels comfortable going home.  I counseled her and her family member on the possible and likely etiologies of her symptoms.  I gave strict return precautions and they expressed understanding.   Lind Repine, MD 06/29/23 1651

## 2023-06-29 NOTE — ED Triage Notes (Signed)
 Pt in via POV, reports ongoing N/V and generalized weakness x approximately 1 week. Denies diarrhea.  Patient appears pale, vitals WDL, NAD noted at this time.

## 2023-06-29 NOTE — ED Provider Notes (Signed)
 [facility] Provider Note    Event Date/Time   First MD Initiated Contact with Patient 06/29/23 1313     (approximate)   History   Chief Complaint: No chief complaint on file.   HPI  Stephanie Anthony is a 89 y.o. female with a history of bipolar disorder, CKD, CAD, GERD who was brought to the ED due to nausea vomiting and generalized weakness for the past week.  No diarrhea or constipation.  Patient reports that she has low energy and gets lightheaded when she stands up.  She does report that she has had a creased appetite and poor oral intake over this time period.  Denies fever or chills, no dysuria chest pain shortness of breath or cough.  Denies headaches paresthesias or motor weakness.          Physical Exam   Triage Vital Signs: ED Triage Vitals  Encounter Vitals Group     BP 06/29/23 1032 106/70     Systolic BP Percentile --      Diastolic BP Percentile --      Pulse Rate 06/29/23 1032 80     Resp 06/29/23 1032 17     Temp 06/29/23 1032 98.8 F (37.1 C)     Temp Source 06/29/23 1032 Oral     SpO2 06/29/23 1032 98 %     Weight 06/29/23 1034 169 lb (76.7 kg)     Height 06/29/23 1034 5\' 7"  (1.702 m)     Head Circumference --      Peak Flow --      Pain Score 06/29/23 1033 5     Pain Loc --      Pain Education --      Exclude from Growth Chart --     Most recent vital signs: Vitals:   06/29/23 1235 06/29/23 1238  BP:  119/66  Pulse:  76  Resp:  18  Temp:  98.2 F (36.8 C)  SpO2: 99% 100%    General: Awake, no distress.  CV:  Good peripheral perfusion.  Regular rate rhythm Resp:  Normal effort.  Clear to auscultation bilaterally Abd:  No distention.  Nontender Other:  Dry oral mucosa.  No lower extremity edema   ED Results / Procedures / Treatments   Labs (all labs ordered are listed, but only abnormal results are displayed) Labs Reviewed  BASIC METABOLIC PANEL - Abnormal; Notable for the following components:       Result Value   Creatinine, Ser 1.79 (*)    GFR, Estimated 27 (*)    All other components within normal limits  CBC - Abnormal; Notable for the following components:   Hemoglobin 11.8 (*)    MCV 77.4 (*)    MCH 25.4 (*)    All other components within normal limits  RESP PANEL BY RT-PCR (RSV, FLU A&B, COVID)  RVPGX2  URINALYSIS, ROUTINE W REFLEX MICROSCOPIC  HEPATIC FUNCTION PANEL  LIPASE, BLOOD  TROPONIN I (HIGH SENSITIVITY)     EKG Interpreted by me Normal sinus rhythm rate of 83.  Normal axis, normal intervals.  Poor R wave.  Normal.  Normal ST segments and T waves  RADIOLOGY    PROCEDURES:  Procedures   MEDICATIONS ORDERED IN ED: Medications  sodium chloride  0.9 % bolus 1,000 mL (1,000 mLs Intravenous New Bag/Given 06/29/23 1440)  ondansetron  (ZOFRAN ) injection 4 mg (4 mg Intravenous Given 06/29/23 1442)     IMPRESSION / MDM / ASSESSMENT AND PLAN / ED  COURSE  I reviewed the triage vital signs and the nursing notes.  DDx: Dehydration, electrolyte derangement, anemia, COVID, influenza, non-STEMI, pancreatitis, gastritis, choledocholithiasis  Patient's presentation is most consistent with acute presentation with potential threat to life or bodily function.  Patient presents with malaise, decreased appetite and clinically dehydrated.  Vital signs are normal, she is nontoxic.  Initial labs are unremarkable.  Will add on LFTs and troponin along with urinalysis.  Will give IV fluids and Zofran  and p.o. trial.       FINAL CLINICAL IMPRESSION(S) / ED DIAGNOSES   Final diagnoses:  Dehydration  Orthostatic dizziness  Stage 4 chronic kidney disease (HCC)     Rx / DC Orders   ED Discharge Orders     None        Note:  This document was prepared using Dragon voice recognition software and may include unintentional dictation errors.   Jacquie Maudlin, MD 06/29/23 1452

## 2023-07-10 ENCOUNTER — Other Ambulatory Visit: Payer: Self-pay | Admitting: Psychiatry

## 2023-07-14 ENCOUNTER — Telehealth: Payer: Self-pay

## 2023-07-14 NOTE — Telephone Encounter (Signed)
 called son and told him that they had the rx on hold but that he stated that they will go ahead and get ready. told pt son to check later this afternoon to see if they have ready.

## 2023-07-14 NOTE — Telephone Encounter (Signed)
 son left a message that pt needs a refill on the lamictal. pt was last seen on 1*-13 next appt 4-14

## 2023-07-14 NOTE — Telephone Encounter (Signed)
 called pharmacy ask him if they had a rx that was sent over on 1-13 and it had a fill date of 2-12. he found rx and said he was sorry that he missed it and had sent over a refill request.
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
## 2023-08-31 ENCOUNTER — Ambulatory Visit (INDEPENDENT_AMBULATORY_CARE_PROVIDER_SITE_OTHER): Payer: Self-pay | Admitting: Psychiatry

## 2023-08-31 ENCOUNTER — Other Ambulatory Visit: Payer: Self-pay

## 2023-08-31 ENCOUNTER — Encounter: Payer: Self-pay | Admitting: Psychiatry

## 2023-08-31 VITALS — BP 155/62 | HR 64 | Temp 97.8°F | Ht 67.0 in | Wt 168.8 lb

## 2023-08-31 DIAGNOSIS — G20C Parkinsonism, unspecified: Secondary | ICD-10-CM

## 2023-08-31 DIAGNOSIS — F3131 Bipolar disorder, current episode depressed, mild: Secondary | ICD-10-CM

## 2023-08-31 MED ORDER — LAMOTRIGINE 100 MG PO TABS: 100.0000 mg | ORAL_TABLET | Freq: Every day | ORAL | 0 refills | Status: DC

## 2023-08-31 NOTE — Patient Instructions (Signed)
 Continue lamotrigine 100 mg daily  Next appointment: 8/11 at 11:30

## 2023-10-19 ENCOUNTER — Telehealth: Payer: Self-pay

## 2023-10-19 NOTE — Telephone Encounter (Signed)
 pt states that she is seeing people in her house and a dog. she also states that the pictures are moving. pt was made and appt for tomorrow at 9:00. i told her i would call her back at 4:30 to make sure she had a way to get to appt.   Pt was last seen on  4-14 next appt 8-11

## 2023-10-20 ENCOUNTER — Ambulatory Visit (INDEPENDENT_AMBULATORY_CARE_PROVIDER_SITE_OTHER): Admitting: Psychiatry

## 2023-10-20 ENCOUNTER — Encounter: Payer: Self-pay | Admitting: Psychiatry

## 2023-10-20 VITALS — BP 122/82 | HR 78 | Temp 98.1°F | Ht 64.0 in | Wt 174.8 lb

## 2023-10-20 DIAGNOSIS — F3181 Bipolar II disorder: Secondary | ICD-10-CM

## 2023-10-20 MED ORDER — ARIPIPRAZOLE 2 MG PO TABS: 2.0000 mg | ORAL_TABLET | Freq: Every day | ORAL | 0 refills | Status: DC

## 2023-10-20 NOTE — Patient Instructions (Signed)
 Continue lamotrigine  100 mg daily  Start abilify  2 mg at night Next appointment: 6/19 at 10 AM
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
## 2023-10-27 ENCOUNTER — Emergency Department (EMERGENCY_DEPARTMENT_HOSPITAL)
Admission: EM | Admit: 2023-10-27 | Discharge: 2023-10-28 | Disposition: A | Discharge status: Other Acute Inpatient Hospital

## 2023-10-27 ENCOUNTER — Other Ambulatory Visit: Payer: Self-pay

## 2023-10-27 ENCOUNTER — Telehealth: Payer: Self-pay | Admitting: Psychiatry

## 2023-10-27 ENCOUNTER — Emergency Department

## 2023-10-27 ENCOUNTER — Telehealth: Payer: Self-pay

## 2023-10-27 DIAGNOSIS — N3000 Acute cystitis without hematuria: Secondary | ICD-10-CM | POA: Insufficient documentation

## 2023-10-27 DIAGNOSIS — N390 Urinary tract infection, site not specified: Secondary | ICD-10-CM | POA: Diagnosis not present

## 2023-10-27 DIAGNOSIS — F319 Bipolar disorder, unspecified: Secondary | ICD-10-CM

## 2023-10-27 DIAGNOSIS — F3110 Bipolar disorder, current episode manic without psychotic features, unspecified: Secondary | ICD-10-CM | POA: Diagnosis not present

## 2023-10-27 DIAGNOSIS — F312 Bipolar disorder, current episode manic severe with psychotic features: Secondary | ICD-10-CM | POA: Diagnosis not present

## 2023-10-27 DIAGNOSIS — Z79899 Other long term (current) drug therapy: Secondary | ICD-10-CM | POA: Insufficient documentation

## 2023-10-27 DIAGNOSIS — N189 Chronic kidney disease, unspecified: Secondary | ICD-10-CM | POA: Insufficient documentation

## 2023-10-27 DIAGNOSIS — R443 Hallucinations, unspecified: Secondary | ICD-10-CM | POA: Insufficient documentation

## 2023-10-27 LAB — URINALYSIS, COMPLETE (UACMP) WITH MICROSCOPIC
Bilirubin Urine: NEGATIVE
Glucose, UA: NEGATIVE mg/dL
Hgb urine dipstick: NEGATIVE
Ketones, ur: NEGATIVE mg/dL
Nitrite: NEGATIVE
Protein, ur: NEGATIVE mg/dL
Specific Gravity, Urine: 1.008 (ref 1.005–1.030)
pH: 6 (ref 5.0–8.0)

## 2023-10-27 LAB — COMPREHENSIVE METABOLIC PANEL WITH GFR
ALT: 15 U/L (ref 0–44)
AST: 23 U/L (ref 15–41)
Albumin: 3.8 g/dL (ref 3.5–5.0)
Alkaline Phosphatase: 47 U/L (ref 38–126)
Anion gap: 10 (ref 5–15)
BUN: 26 mg/dL — ABNORMAL HIGH (ref 8–23)
CO2: 21 mmol/L — ABNORMAL LOW (ref 22–32)
Calcium: 9 mg/dL (ref 8.9–10.3)
Chloride: 112 mmol/L — ABNORMAL HIGH (ref 98–111)
Creatinine, Ser: 1.61 mg/dL — ABNORMAL HIGH (ref 0.44–1.00)
GFR, Estimated: 31 mL/min — ABNORMAL LOW (ref >60)
Glucose, Bld: 132 mg/dL — ABNORMAL HIGH (ref 70–99)
Potassium: 4 mmol/L (ref 3.5–5.1)
Sodium: 143 mmol/L (ref 135–145)
Total Bilirubin: 0.6 mg/dL (ref 0.0–1.2)
Total Protein: 6.9 g/dL (ref 6.5–8.1)

## 2023-10-27 LAB — URINE DRUG SCREEN, QUALITATIVE (ARMC ONLY)
Amphetamines, Ur Screen: NOT DETECTED
Barbiturates, Ur Screen: NOT DETECTED
Benzodiazepine, Ur Scrn: NOT DETECTED
Cannabinoid 50 Ng, Ur ~~LOC~~: NOT DETECTED
Cocaine Metabolite,Ur ~~LOC~~: NOT DETECTED
MDMA (Ecstasy)Ur Screen: NOT DETECTED
Methadone Scn, Ur: NOT DETECTED
Opiate, Ur Screen: NOT DETECTED
Phencyclidine (PCP) Ur S: NOT DETECTED
Tricyclic, Ur Screen: NOT DETECTED

## 2023-10-27 LAB — CBC
HCT: 33.2 % — ABNORMAL LOW (ref 36.0–46.0)
Hemoglobin: 10.9 g/dL — ABNORMAL LOW (ref 12.0–15.0)
MCH: 24.9 pg — ABNORMAL LOW (ref 26.0–34.0)
MCHC: 32.8 g/dL (ref 30.0–36.0)
MCV: 76 fL — ABNORMAL LOW (ref 80.0–100.0)
Platelets: 226 10*3/uL (ref 150–400)
RBC: 4.37 MIL/uL (ref 3.87–5.11)
RDW: 15 % (ref 11.5–15.5)
WBC: 7.7 10*3/uL (ref 4.0–10.5)
nRBC: 0 % (ref 0.0–0.2)

## 2023-10-27 LAB — ETHANOL: Alcohol, Ethyl (B): <15 mg/dL (ref <15)

## 2023-10-27 MED ORDER — CEPHALEXIN 500 MG PO CAPS
500.0000 mg | ORAL_CAPSULE | Freq: Two times a day (BID) | ORAL | Status: DC
  Administered 2023-10-28: 500 mg via ORAL
  Filled 2023-10-27: qty 1

## 2023-10-27 MED ORDER — CEFTRIAXONE SODIUM 1 G IJ SOLR
1.0000 g | Freq: Once | INTRAMUSCULAR | Status: AC
  Administered 2023-10-27: 1 g via INTRAMUSCULAR
  Filled 2023-10-27: qty 10

## 2023-10-27 MED ORDER — LIDOCAINE HCL (PF) 1 % IJ SOLN
INTRAMUSCULAR | Status: AC
  Filled 2023-10-27: qty 5

## 2023-10-27 NOTE — Telephone Encounter (Signed)
 Patient called the office today stating that she was previously on valium and was told by a different provider that it was damaging her kidneys she stated that she is now stage four kidney failure she stated that since she was taken off of her Abilify  she is now hallucinating the handles on her refrigerator are turning into snakes and wrapping around the refrigerator the black and white pictures on the wall are turning to color some of the pictures have what seems to look like demons she has a picture of Jesus and his clothes are moving along with his hands a picture of herself seems to be talking to her and once she walks toward the different items it all goes away it does not happen when she is sleeping. Her family has told her that she is talking more than normal she is afraid that if she goes to the hospital she will be admitted and she does not want to be in the hospital please advise.

## 2023-10-27 NOTE — Telephone Encounter (Signed)
 Patient called stating her hallucinations are getting worse, states she is seeing monsters on her wall above her pictures. The pictures are coming to life. She is taking her medication as prescribed please call to advise. She has been put on the wait list for sooner appointment prior to 11-05-23.

## 2023-10-27 NOTE — Telephone Encounter (Signed)
 Please contact patient and get more information as to what is going on . If she is actively psychotic and getting worse , she may have to go to the urgent care or emergency department. Please let me know.

## 2023-10-27 NOTE — Telephone Encounter (Signed)
 Please let patient know that she was advised to restart taking Abilify  at her most recent visit with Dr. Edda Goo on 10/20/2023.  If she has not already started taking the Abilify  please advise her to start taking Abilify .  However I will still recommend going to the nearest urgent care/emergency department just to make sure other medical problems are ruled out.  Please explain to her that if she goes to the emergency department they will be able to do a workup on her to make sure nothing else is contributing to her current problems.   Please also let her know that just because she goes to the emergency department to get help does not mean that she is going to be admitted.

## 2023-10-27 NOTE — Progress Notes (Signed)
 Was paged to visit with pt. Spoke with RN Ace Abu prior to entering room-visit went well-pt indeed experiencing hallucinations and described them to this chaplain as "scary but friendly" she shared the "snakes" and "monsters" were not trying to hurt her but just there. She expressed frustration that she feels this all started (this being the hallucinations) after her medications was changed and or removed. Chaplain services available if needs arise

## 2023-10-27 NOTE — ED Triage Notes (Signed)
 Pt here with hallucinations. Pt has a hx of bipolar disorder and went to see her psychiatrist. Pt stopped her Abilify  a few days ago and shortly after began having the hallucinations. Pt also c/o of being hyper as well. Pt states she has not had a full night's rest in 1 week.

## 2023-10-27 NOTE — ED Provider Notes (Signed)
 [facility] Provider Note    Event Date/Time   First MD Initiated Contact with Patient 10/27/23 1801     (approximate)   History   Hallucinations  Pt here with hallucinations. Pt has a hx of bipolar disorder and went to see her psychiatrist. Pt stopped her Abilify  a few days ago and shortly after began having the hallucinations. Pt also c/o of being hyper as well. Pt states she has not had a full night's rest in 1 week.   HPI Stephanie Anthony is a 89 y.o. female PMH bipolar disorder, CKD, hyperlipidemia presents for evaluation of hallucinations - Patient reportedly stopped her Abilify  a few days ago and started having hallucinations thereafter - On my evaluation, patient states she has been seeing visual hallucinations, the pictures on her walls are moving.  Says this started shortly after she was told to stop taking her Abilify , unclear if she restarted this on her own or not. - Lives alone - Denies SI, HI - No recent falls or infectious symptoms  Per chart review, last seen by psychiatrist on 10/20/2023.  Endorsed visual hallucinations at that time.  Noted to have significant worsening of her bipolar 2 disorder at that time --restarted Abilify  for bipolar disorder/manic.  Also continuing lamotrigine .       Physical Exam   Triage Vital Signs: ED Triage Vitals  Encounter Vitals Group     BP 10/27/23 1544 (!) 156/74     Systolic BP Percentile --      Diastolic BP Percentile --      Pulse Rate 10/27/23 1544 73     Resp 10/27/23 1544 18     Temp 10/27/23 1544 98.3 F (36.8 C)     Temp Source 10/27/23 1544 Oral     SpO2 10/27/23 1544 100 %     Weight 10/27/23 1545 174 lb 13.2 oz (79.3 kg)     Height 10/27/23 1545 5\' 4"  (1.626 m)     Head Circumference --      Peak Flow --      Pain Score --      Pain Loc --      Pain Education --      Exclude from Growth Chart --     Most recent vital signs: Vitals:   10/27/23 1544 10/27/23 1930  BP: (!)  156/74   Pulse: 73   Resp: 18   Temp: 98.3 F (36.8 C) 98.4 F (36.9 C)  SpO2: 100%      General: Awake, no distress.  HEENT: Normocephalic, atraumatic CV:  Good peripheral perfusion. RRR, RP 2+ Resp:  Normal effort. CTAB Abd:  No distention. Nontender to deep palpation throughout Psych:  Endorses visual hallucinations, denies auditory hallucinations, no SI, HI.  Somewhat hyperverbal on my eval.   ED Results / Procedures / Treatments   Labs (all labs ordered are listed, but only abnormal results are displayed) Labs Reviewed  COMPREHENSIVE METABOLIC PANEL WITH GFR - Abnormal; Notable for the following components:      Result Value   Chloride 112 (*)    CO2 21 (*)    Glucose, Bld 132 (*)    BUN 26 (*)    Creatinine, Ser 1.61 (*)    GFR, Estimated 31 (*)    All other components within normal limits  CBC - Abnormal; Notable for the following components:   Hemoglobin 10.9 (*)    HCT 33.2 (*)    MCV 76.0 (*)  MCH 24.9 (*)    All other components within normal limits  URINALYSIS, COMPLETE (UACMP) WITH MICROSCOPIC - Abnormal; Notable for the following components:   Color, Urine STRAW (*)    APPearance CLEAR (*)    Leukocytes,Ua SMALL (*)    Bacteria, UA RARE (*)    All other components within normal limits  ETHANOL  URINE DRUG SCREEN, QUALITATIVE ([facility] ONLY)     EKG  N/a   RADIOLOGY Radiology interpreted by myself and radiology reports reviewed.  CT head with no acute pathology.    PROCEDURES:  Critical Care performed: No  Procedures   MEDICATIONS ORDERED IN ED: Medications  cephALEXin  (KEFLEX ) capsule 500 mg (has no administration in time range)  cefTRIAXone  (ROCEPHIN ) injection 1 g (1 g Intramuscular Given 10/27/23 2059)  lidocaine  (PF) (XYLOCAINE ) 1 % injection (  Given 10/27/23 2100)     IMPRESSION / MDM / ASSESSMENT AND PLAN / ED COURSE  I reviewed the triage vital signs and the nursing notes.                               DDX/MDM/AP: Differential diagnosis includes, but is not limited to, primary psychiatric disorder, considered but doubt intracranial pathology including hemorrhage.  Consider metabolic or infectious encephalopathy, will screen for underlying UTI.  No evidence of pneumonia or intra-abdominal pathology at this time.  Not clinically concerned for meningitis, encephalitis.  Plan: - Labs - CT head - Consider psychiatry consult if medically cleared  Patient's presentation is most consistent with acute presentation with potential threat to life or bodily function.   ED course below.  Workup notable for possible underlying UTI, do not suspect this as full cause of symptoms however.  Treating with single dose of ceftriaxone  and continuing on oral Keflex  for further 6 days.  Otherwise medically cleared.  Psychiatry consultation placed.  Disposition per psychiatry.  Clinical Course as of 10/27/23 2137  Tue Oct 27, 2023  1825 CBC with no leukocytosis, mild drop in hemoglobin (<1) though overall stable [MM]  1949 CMP reviewed, overall unremarkable [MM]  1956 CT head with no obvious pathology on my interpretation, radiology report below  IMPRESSION: Motion degraded and limited exam. No gross acute intracranial abnormality. Chronic small vessel ischemic changes of the white matter.   [MM]  2041 Urinalysis reviewed, equivocal-mild pyuria, rare bacteria, small leuk esterase, negative nitrite.  Given that she is having some hallucinations and is of advanced age, will treat empirically with antibiotics.   [MM]  2043 Treating with 1 mg IM ceftriaxone , plan for 6-day course of Keflex .  I do not suspect that UTI fully explains patient's symptoms here, presentation most consistent with primary psychiatric disorder.  Will place psychiatry consultation.  Otherwise medically cleared. [MM]    Clinical Course User Index [MM] Collis Deaner, MD     FINAL CLINICAL IMPRESSION(S) / ED DIAGNOSES   Final  diagnoses:  Hallucinations  Acute cystitis without hematuria  Bipolar affective disorder, remission status unspecified (HCC)     Rx / DC Orders   ED Discharge Orders     None        Note:  This document was prepared using Dragon voice recognition software and may include unintentional dictation errors.   Collis Deaner, MD 10/27/23 2137

## 2023-10-27 NOTE — BH Assessment (Signed)
 This Clinical research associate contacted [facility] via phone to request an assessment for this patient, request has been made, assessment is currently pending.

## 2023-10-27 NOTE — Telephone Encounter (Signed)
 Please try to checking back with her again at some point today or tomorrow.  Could also place her on a priority list to have her come in to see Dr. Edda Goo.  If she is okay with us  contacting one of her family members could try to do that as well so that they can help her to get her to the nearest urgent care if needed.

## 2023-10-28 ENCOUNTER — Encounter: Payer: Self-pay | Admitting: Psychiatry

## 2023-10-28 ENCOUNTER — Other Ambulatory Visit: Payer: Self-pay

## 2023-10-28 ENCOUNTER — Inpatient Hospital Stay
Admission: AD | Admit: 2023-10-28 | Discharge: 2023-11-05 | DRG: 885 | Disposition: A | Discharge status: Home / Self Care | Source: Intra-hospital | Attending: Psychiatry | Admitting: Psychiatry

## 2023-10-28 DIAGNOSIS — R443 Hallucinations, unspecified: Secondary | ICD-10-CM | POA: Diagnosis present

## 2023-10-28 DIAGNOSIS — F3110 Bipolar disorder, current episode manic without psychotic features, unspecified: Secondary | ICD-10-CM

## 2023-10-28 DIAGNOSIS — Z79899 Other long term (current) drug therapy: Secondary | ICD-10-CM

## 2023-10-28 DIAGNOSIS — N184 Chronic kidney disease, stage 4 (severe): Secondary | ICD-10-CM | POA: Diagnosis present

## 2023-10-28 DIAGNOSIS — Z555 Less than a high school diploma: Secondary | ICD-10-CM | POA: Diagnosis not present

## 2023-10-28 DIAGNOSIS — N390 Urinary tract infection, site not specified: Secondary | ICD-10-CM | POA: Diagnosis present

## 2023-10-28 DIAGNOSIS — E039 Hypothyroidism, unspecified: Secondary | ICD-10-CM | POA: Diagnosis present

## 2023-10-28 DIAGNOSIS — Z9049 Acquired absence of other specified parts of digestive tract: Secondary | ICD-10-CM

## 2023-10-28 DIAGNOSIS — K219 Gastro-esophageal reflux disease without esophagitis: Secondary | ICD-10-CM | POA: Diagnosis present

## 2023-10-28 DIAGNOSIS — Z87891 Personal history of nicotine dependence: Secondary | ICD-10-CM | POA: Diagnosis not present

## 2023-10-28 DIAGNOSIS — Z823 Family history of stroke: Secondary | ICD-10-CM | POA: Diagnosis not present

## 2023-10-28 DIAGNOSIS — M7989 Other specified soft tissue disorders: Secondary | ICD-10-CM | POA: Diagnosis not present

## 2023-10-28 DIAGNOSIS — Z96653 Presence of artificial knee joint, bilateral: Secondary | ICD-10-CM | POA: Diagnosis present

## 2023-10-28 DIAGNOSIS — R710 Precipitous drop in hematocrit: Secondary | ICD-10-CM | POA: Diagnosis present

## 2023-10-28 DIAGNOSIS — R54 Age-related physical debility: Secondary | ICD-10-CM | POA: Diagnosis present

## 2023-10-28 DIAGNOSIS — E890 Postprocedural hypothyroidism: Secondary | ICD-10-CM | POA: Diagnosis present

## 2023-10-28 DIAGNOSIS — Z888 Allergy status to other drugs, medicaments and biological substances status: Secondary | ICD-10-CM | POA: Diagnosis not present

## 2023-10-28 DIAGNOSIS — I10 Essential (primary) hypertension: Secondary | ICD-10-CM | POA: Diagnosis present

## 2023-10-28 DIAGNOSIS — F3112 Bipolar disorder, current episode manic without psychotic features, moderate: Secondary | ICD-10-CM | POA: Diagnosis not present

## 2023-10-28 DIAGNOSIS — Z8249 Family history of ischemic heart disease and other diseases of the circulatory system: Secondary | ICD-10-CM

## 2023-10-28 DIAGNOSIS — Z886 Allergy status to analgesic agent status: Secondary | ICD-10-CM | POA: Diagnosis not present

## 2023-10-28 DIAGNOSIS — Z885 Allergy status to narcotic agent status: Secondary | ICD-10-CM | POA: Diagnosis not present

## 2023-10-28 DIAGNOSIS — R600 Localized edema: Secondary | ICD-10-CM

## 2023-10-28 DIAGNOSIS — Z79890 Hormone replacement therapy (postmenopausal): Secondary | ICD-10-CM | POA: Diagnosis not present

## 2023-10-28 DIAGNOSIS — K649 Unspecified hemorrhoids: Secondary | ICD-10-CM | POA: Diagnosis present

## 2023-10-28 DIAGNOSIS — E7800 Pure hypercholesterolemia, unspecified: Secondary | ICD-10-CM | POA: Diagnosis present

## 2023-10-28 DIAGNOSIS — G4700 Insomnia, unspecified: Secondary | ICD-10-CM | POA: Diagnosis present

## 2023-10-28 DIAGNOSIS — I2510 Atherosclerotic heart disease of native coronary artery without angina pectoris: Secondary | ICD-10-CM | POA: Diagnosis present

## 2023-10-28 DIAGNOSIS — F319 Bipolar disorder, unspecified: Principal | ICD-10-CM | POA: Diagnosis present

## 2023-10-28 DIAGNOSIS — F312 Bipolar disorder, current episode manic severe with psychotic features: Principal | ICD-10-CM | POA: Diagnosis present

## 2023-10-28 DIAGNOSIS — F3111 Bipolar disorder, current episode manic without psychotic features, mild: Secondary | ICD-10-CM | POA: Diagnosis not present

## 2023-10-28 DIAGNOSIS — I129 Hypertensive chronic kidney disease with stage 1 through stage 4 chronic kidney disease, or unspecified chronic kidney disease: Secondary | ICD-10-CM | POA: Diagnosis present

## 2023-10-28 LAB — RESP PANEL BY RT-PCR (RSV, FLU A&B, COVID)  RVPGX2
Influenza A by PCR: NEGATIVE
Influenza B by PCR: NEGATIVE
Resp Syncytial Virus by PCR: NEGATIVE
SARS Coronavirus 2 by RT PCR: NEGATIVE

## 2023-10-28 MED ORDER — OLANZAPINE 10 MG IM SOLR: 5.0000 mg | Freq: Three times a day (TID) | INTRAMUSCULAR | Status: DC | PRN

## 2023-10-28 MED ORDER — QUETIAPINE FUMARATE 25 MG PO TABS
25.0000 mg | ORAL_TABLET | Freq: Every day | ORAL | Status: DC
  Administered 2023-10-29 – 2023-11-05 (×8): 25 mg via ORAL
  Filled 2023-10-28 (×9): qty 1

## 2023-10-28 MED ORDER — ACETAMINOPHEN 325 MG PO TABS
650.0000 mg | ORAL_TABLET | Freq: Four times a day (QID) | ORAL | Status: DC | PRN
  Administered 2023-11-02 – 2023-11-05 (×6): 650 mg via ORAL
  Filled 2023-10-28 (×6): qty 2

## 2023-10-28 MED ORDER — LAMOTRIGINE 100 MG PO TABS
100.0000 mg | ORAL_TABLET | Freq: Every day | ORAL | Status: DC
  Administered 2023-10-28: 100 mg via ORAL
  Filled 2023-10-28: qty 1

## 2023-10-28 MED ORDER — TRAZODONE HCL 50 MG PO TABS
50.0000 mg | ORAL_TABLET | Freq: Every evening | ORAL | Status: DC | PRN
  Administered 2023-10-28 – 2023-11-04 (×7): 50 mg via ORAL
  Filled 2023-10-28 (×7): qty 1

## 2023-10-28 MED ORDER — TRAZODONE HCL 50 MG PO TABS: 50.0000 mg | ORAL_TABLET | Freq: Every evening | ORAL | Status: DC | PRN

## 2023-10-28 MED ORDER — CEPHALEXIN 500 MG PO CAPS
500.0000 mg | ORAL_CAPSULE | Freq: Two times a day (BID) | ORAL | Status: AC
  Administered 2023-10-28 – 2023-11-02 (×11): 500 mg via ORAL
  Filled 2023-10-28 (×11): qty 1

## 2023-10-28 MED ORDER — QUETIAPINE FUMARATE 25 MG PO TABS
25.0000 mg | ORAL_TABLET | Freq: Every day | ORAL | Status: DC
  Administered 2023-10-28: 25 mg via ORAL
  Filled 2023-10-28: qty 1

## 2023-10-28 MED ORDER — QUETIAPINE FUMARATE 25 MG PO TABS: 50.0000 mg | ORAL_TABLET | Freq: Every day | ORAL | Status: DC

## 2023-10-28 MED ORDER — OLANZAPINE 5 MG PO TBDP: 5.0000 mg | ORAL_TABLET | Freq: Three times a day (TID) | ORAL | Status: DC | PRN

## 2023-10-28 MED ORDER — LAMOTRIGINE 100 MG PO TABS: 100.0000 mg | ORAL_TABLET | Freq: Every day | ORAL | Status: DC

## 2023-10-28 MED ORDER — ALUM & MAG HYDROXIDE-SIMETH 200-200-20 MG/5ML PO SUSP
30.0000 mL | ORAL | Status: DC | PRN
  Administered 2023-10-29 – 2023-11-03 (×5): 30 mL via ORAL
  Filled 2023-10-28 (×5): qty 30

## 2023-10-28 MED ORDER — QUETIAPINE FUMARATE 25 MG PO TABS
50.0000 mg | ORAL_TABLET | Freq: Every day | ORAL | Status: DC
  Administered 2023-10-28 – 2023-10-30 (×3): 50 mg via ORAL
  Filled 2023-10-28 (×3): qty 2

## 2023-10-28 NOTE — ED Notes (Signed)
 PT given breakfast meal

## 2023-10-28 NOTE — Progress Notes (Signed)
   10/28/23 1700  Charting Type  Charting Type Admission  Focused Reassessment No Changes Neurological;Musculoskeletal;Psychosocial  Focused Reassessment Changes Noted Neurological;Psychosocial;Musculoskeletal  Safety Check Verification  Has the RN verified the 15 minute safety check completion? Yes  Neurological  Neuro (WDL) X  Orientation Level Oriented to person;Oriented to place;Oriented to situation  Cognition Impulsive;Poor attention/concentration  Speech Clear  Neuro Symptoms Anxiety;Depression;Wandering;Other (Comment) (flight of ideas)  Neuro symptoms relieved by Anti-anxiety medication;Relaxation techniques (Comment)  Neuro Additional Assessments  (rambling pressured speech)  HEENT  HEENT (WDL) WDL  Respiratory  Respiratory (WDL) WDL  Cardiac  Cardiac (WDL) WDL  Vascular  Vascular (WDL) X  Edema Right lower extremity;Left lower extremity (3+ pitting edema)  Integumentary  Staff Member Assisting with Skin Assessment on Admission Maureen RN  Skin Condition Itching;Other (Comment)  Itching intervention small open circular sores  Skin Integrity Blister;Scratch Marks;Abrasion  Abrasion Location Back;Leg;Shoulder (and 3+PE bilat LEs; hx TKR bilat;surgical scars present)  Abrasion Location Orientation Upper;Bilateral;Lower;Other (Comment) (upper back and shoulders and lower legs anterior)  Abrasion Intervention Other (Comment) (none at this time)  Description of Blister Serous  Blister Location Back;Leg;Shoulder  Blister Location Orientation Bilateral;Upper;Lower;Other (Comment) (upper back and shoulders and bilat lower legs)  Blister Intervention Other (Comment) (none at this time)  Scratch Marks Location Back  Scratch Marks Location Orientation Upper  Scratch Marks Intervention Other (Comment) (none at this time)  Sacral Foam Prophylactic Dressing  Dressing Interventions  (no dressing at present)  Medical Device Prophylactic Dressing  Skin Assessed Under All  Medical Device Prophylactic Dressings (if applicable) Other (Comment)  Braden Scale (Ages 8 and up)  Sensory Perceptions 4 (see above)  Moisture 4  Activity 3  Mobility 2  Nutrition 3  Friction and Shear 2  Braden Scale Score 18  Braden Interventions  Required Braden Scale Bundle Interventions *See Row Information* At Risk for pressure injury (13-18) - At Risk requirements implemented  Patient NOT compliant with following measures Float heels  Musculoskeletal  Musculoskeletal (WDL) X (bilat TKR and 3+PE bilat LEs)  Assistive Device Standard walker  Generalized Weakness Yes  Weight Bearing Restrictions Per Provider Order No  Musculoskeletal Details  RLE Swelling;Limited movement  LLE Limited movement;Swelling  Gastrointestinal  Gastrointestinal (WDL) X (constipation; small dark balls)  GU Assessment  Genitourinary (WDL) X  Genitourinary Symptoms  (incontinence)  Genitalia  Female Genitalia Intact  Neurological  Level of Consciousness Alert

## 2023-10-28 NOTE — ED Notes (Signed)
Pt given lunch meal
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
## 2023-10-28 NOTE — BH Assessment (Signed)
 Comprehensive Clinical Assessment (CCA) Note  10/28/2023 Stephanie Anthony [id number]  Chief Complaint: Patient is a 89 year old female presenting to [facility] ED voluntarily. Per triage note Pt here with hallucinations. Pt has a hx of bipolar disorder and went to see her psychiatrist. Pt stopped her Abilify  a few days ago and shortly after began having the hallucinations. Pt also c/o of being hyper as well. Pt states she has not had a full night's rest in 1 week. During assessment patient appears alert and oriented x4, cooperative but at times anxious. Patient reports I wasn't able to deal with it but now I am able, I just feel like it shouldn't be happening in my house in regards to the hallucinations that the patient has been having. Patient reports the pictures on my wall of my grandchildren have been moving and doing all kinds of crazy things, she also reports seeing a snake in her home that should have been her refrigerator handle I saw the handles turn into snakes. When this writer entered the room the patient reports that the tv cable looked like a snake. Patient also reports a lack of sleep I'm so hyper I haven't slept in a week. Patient reports that these symptoms have been going on for a week ever since my psychiatrist told me to stop taking my Abilify  because it caused me to have kidney failure. Patient reports after she told her psychiatrist what she had been experiencing she was placed back on the medication but is still experiencing hallucinations. Patient denies SI/HI/AH. Chief Complaint  Patient presents with   Hallucinations   Visit Diagnosis: Bipolar 1 Disorder    CCA Screening, Triage and Referral (STR)  Patient Reported Information How did you hear about us ? Self  Referral name: No data recorded Referral phone number: No data recorded  Whom do you see for routine medical problems? No data recorded Practice/Facility Name: No data recorded Practice/Facility Phone  Number: No data recorded Name of Contact: No data recorded Contact Number: No data recorded Contact Fax Number: No data recorded Prescriber Name: No data recorded Prescriber Address (if known): No data recorded  What Is the Reason for Your Visit/Call Today? Pt here with hallucinations. Pt has a hx of bipolar disorder and went to see her psychiatrist. Pt stopped her Abilify  a few days ago and shortly after began having the hallucinations. Pt also c/o of being hyper as well. Pt states she has not had a full night's rest in 1 week.  How Long Has This Been Causing You Problems? 1 wk - 1 month  What Do You Feel Would Help You the Most Today? Treatment for Depression or other mood problem   Have You Recently Been in Any Inpatient Treatment (Hospital/Detox/Crisis Center/28-Day Program)? No data recorded Name/Location of Program/Hospital:No data recorded How Long Were You There? No data recorded When Were You Discharged? No data recorded  Have You Ever Received Services From [facility] Before? No data recorded Who Do You See at [place]? No data recorded  Have You Recently Had Any Thoughts About Hurting Yourself? No  Are You Planning to Commit Suicide/Harm Yourself At This time? No   Have you Recently Had Thoughts About Hurting Someone Marigene Shoulder? No  Explanation: No data recorded  Have You Used Any Alcohol or Drugs in the Past 24 Hours? No  How Long Ago Did You Use Drugs or Alcohol? No data recorded What Did You Use and How Much? No data recorded  Do You Currently  Have a Therapist/Psychiatrist? Yes  Name of Therapist/Psychiatrist: Dr. Doreen Gamma   Have You Been Recently Discharged From Any Office Practice or Programs? No  Explanation of Discharge From Practice/Program: No data recorded    CCA Screening Triage Referral Assessment Type of Contact: Face-to-Face  Is this Initial or Reassessment? No data recorded Date Telepsych consult ordered in [facility]:  No data recorded Time Telepsych  consult ordered in [facility]:  No data recorded  Patient Reported Information Reviewed? No data recorded Patient Left Without Being Seen? No data recorded Reason for Not Completing Assessment: No data recorded  Collateral Involvement: No data recorded  Does Patient Have a Court Appointed Legal Guardian? No data recorded Name and Contact of Legal Guardian: No data recorded If Minor and Not Living with Parent(s), Who has Custody? No data recorded Is CPS involved or ever been involved? Never  Is APS involved or ever been involved? Never   Patient Determined To Be At Risk for Harm To Self or Others Based on Review of Patient Reported Information or Presenting Complaint? No  Method: No data recorded Availability of Means: No data recorded Intent: No data recorded Notification Required: No data recorded Additional Information for Danger to Others Potential: No data recorded Additional Comments for Danger to Others Potential: No data recorded Are There Guns or Other Weapons in Your Home? No  Types of Guns/Weapons: No data recorded Are These Weapons Safely Secured?                            No data recorded Who Could Verify You Are Able To Have These Secured: No data recorded Do You Have any Outstanding Charges, Pending Court Dates, Parole/Probation? No data recorded Contacted To Inform of Risk of Harm To Self or Others: No data recorded  Location of Assessment: [facility] ED   Does Patient Present under Involuntary Commitment? No  IVC Papers Initial File Date: No data recorded  [place] of Residence: [place]   Patient Currently Receiving the Following Services: Medication Management   Determination of Need: Emergent (2 hours)   Options For Referral: No data recorded    CCA Biopsychosocial Intake/Chief Complaint:  No data recorded Current Symptoms/Problems: No data recorded  Patient Reported Schizophrenia/Schizoaffective Diagnosis in Past: No   Strengths: Patient is able to  communicate her needs; able to ambulate on her and is independent  Preferences: No data recorded Abilities: No data recorded  Type of Services Patient Feels are Needed: No data recorded  Initial Clinical Notes/Concerns: No data recorded  Mental Health Symptoms Depression:  Fatigue; Hopelessness; Difficulty Concentrating   Duration of Depressive symptoms: Greater than two weeks   Mania:  Racing thoughts   Anxiety:   Difficulty concentrating; Fatigue; Restlessness; Worrying   Psychosis:  Hallucinations   Duration of Psychotic symptoms: Less than six months   Trauma:  None   Obsessions:  Recurrent & persistent thoughts/impulses/images   Compulsions:  None   Inattention:  None   Hyperactivity/Impulsivity:  None   Oppositional/Defiant Behaviors:  None   Emotional Irregularity:  None   Other Mood/Personality Symptoms:  No data recorded   Mental Status Exam Appearance and self-care  Stature:  Average   Weight:  Average weight   Clothing:  Casual   Grooming:  Normal   Cosmetic use:  None   Posture/gait:  Normal   Motor activity:  Not Remarkable   Sensorium  Attention:  Normal   Concentration:  Anxiety interferes   Orientation:  X5   Recall/memory:  Normal   Affect and Mood  Affect:  Anxious   Mood:  Anxious   Relating  Eye contact:  Normal   Facial expression:  Responsive   Attitude toward examiner:  Cooperative   Thought and Language  Speech flow: Clear and Coherent   Thought content:  Appropriate to Mood and Circumstances   Preoccupation:  None   Hallucinations:  Visual   Organization:  No data recorded  [employer] of Knowledge:  Fair   Intelligence:  Average   Abstraction:  Functional   Judgement:  Good   Reality Testing:  Realistic   Insight:  Good   Decision Making:  Normal   Social Functioning  Social Maturity:  Responsible   Social Judgement:  Normal   Stress  Stressors:  Illness   Coping  Ability:  Normal   Skill Deficits:  None   Supports:  Family; Friends/Service system     Religion: Religion/Spirituality Are You A Religious Person?: Yes 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: Leisure / Recreation Do You Have Hobbies?: No  Exercise/Diet: Exercise/Diet Do You Exercise?: No Have You Gained or Lost A Significant Amount of Weight in the Past Six Months?: No Do You Follow a Special Diet?: No Do You Have Any Trouble Sleeping?: Yes Explanation of Sleeping Difficulties: Patient reports having no sleep for the past week   CCA Employment/Education Employment/Work Situation: Employment / Work Situation Employment Situation: Retired Has Patient ever Been in Equities trader?: No  Education: Education Is Patient Currently Attending School?: No Did You Have An Individualized Education Program (IIEP): No Did You Have Any Difficulty At [employer]?: No Patient's Education Has Been Impacted by Current Illness: No   CCA Family/Childhood History Family and Relationship History: Family history Marital status: Single Does patient have children?: Yes How is patient's relationship with their children?: Patient has a good relationship with her children  Childhood History:  Childhood History Did patient suffer any verbal/emotional/physical/sexual abuse as a child?: No Did patient suffer from severe childhood neglect?: No Has patient ever been sexually abused/assaulted/raped as an adolescent or adult?: No Was the patient ever a victim of a crime or a disaster?: No Witnessed domestic violence?: No Has patient been affected by domestic violence as an adult?: No  Child/Adolescent Assessment:     CCA Substance Use Alcohol/Drug Use: Alcohol / Drug Use Pain Medications: see mar Prescriptions: see mar Over the Counter: see mar History of alcohol / drug use?: No history of alcohol / drug abuse                         ASAM's:   Six Dimensions of Multidimensional Assessment  Dimension 1:  Acute Intoxication and/or Withdrawal Potential:      Dimension 2:  Biomedical Conditions and Complications:      Dimension 3:  Emotional, Behavioral, or Cognitive Conditions and Complications:     Dimension 4:  Readiness to Change:     Dimension 5:  Relapse, Continued use, or Continued Problem Potential:     Dimension 6:  Recovery/Living Environment:     ASAM Severity Score:    ASAM Recommended Level of Treatment:     Substance use Disorder (SUD)    Recommendations for Services/Supports/Treatments:    DSM5 Diagnoses: Patient Active Problem List   Diagnosis Date Noted   History of total knee arthroplasty, left 06/10/2023   Primary osteoarthritis of left knee 04/05/2023   History  of glaucoma 08/05/2022   Seasonal allergies 07/31/2021   History of 2019 novel coronavirus disease (COVID-19) 01/28/2021   BPPV (benign paroxysmal positional vertigo), left 02/05/2020   Near syncope 02/04/2020   Gastroesophageal reflux disease without esophagitis 10/27/2019   Resting tremor 09/26/2019   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   Proteinuria 02/25/2019   Mass of appendix 06/21/2018   AKI (acute kidney injury) (HCC) 07/11/2015   Anorexia 07/09/2015   Status post total right knee replacement 06/22/2015   Severe bipolar I disorder with depression (HCC) 11/16/2014   Enteritis 10/17/2014   Gastroenteritis, non-infectious 10/17/2014   Chronic kidney disease (CKD), stage III (moderate) (HCC) 09/27/2014   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   H/O nutritional disorder 09/27/2014   HLD (hyperlipidemia) 09/27/2014   Heart valve incompetence 09/27/2014   Mitral and aortic incompetence 09/27/2014   Personal history of other endocrine, nutritional and metabolic disease 16/02/9603   Hypothyroidism, postop 09/27/2014   Bipolar disorder, current episode depressed, severe, without psychotic features (HCC)    Bipolar  affective disorder, current episode depressed (HCC) 09/25/2014   Bipolar I disorder, most recent episode depressed (HCC) 09/25/2014   Chronic kidney disease, stage IV (severe) (HCC) 09/19/2014   Pure hypercholesterolemia 09/19/2014   Acquired hypothyroidism 03/29/2014   Bipolar affective disorder (HCC) 03/29/2014   Bradycardia 03/29/2014   Essential (primary) hypertension 03/29/2014   MI (mitral incompetence) 03/29/2014   Acquired cyst of kidney 06/18/2012    Patient Centered Plan: Patient is on the following Treatment Plan(s):  Anxiety   Referrals to Alternative Service(s): Referred to Alternative Service(s):   Place:   Date:   Time:    Referred to Alternative Service(s):   Place:   Date:   Time:    Referred to Alternative Service(s):   Place:   Date:   Time:    Referred to Alternative Service(s):   Place:   Date:   Time:      @BHCOLLABOFCARE @  [employer], LCAS-A

## 2023-10-28 NOTE — ED Notes (Signed)
 PT assisted to the restroom and back.

## 2023-10-28 NOTE — ED Provider Notes (Signed)
 Emergency Medicine Observation Re-evaluation Note  Stephanie Anthony is a 89 y.o. female, seen on rounds today.  Pt initially presented to the ED for complaints of Hallucinations Currently, the patient is resting, voices no medical complaints.  Physical Exam  BP (!) 156/74   Pulse 73   Temp 98.4 F (36.9 C) (Oral)   Resp 18   Ht 5' 4 (1.626 m)   Wt 79.3 kg   SpO2 100%   BMI 30.01 kg/m  Physical Exam General: Resting in no acute distress Cardiac: No cyanosis Lungs: Equal rise and fall Psych: Not agitated  ED Course / MDM  EKG:   I have reviewed the labs performed to date as well as medications administered while in observation.  Recent changes in the last 24 hours include no events overnight.  Plan  Current plan is for inpatient recommendation per psychiatry.    Stephanie Anthony J, MD 10/28/23 [phone]

## 2023-10-28 NOTE — ED Notes (Signed)
 Pt ambulated to the restroom and back

## 2023-10-28 NOTE — Plan of Care (Signed)
  Problem: Education: Goal: Utilization of techniques to improve thought processes will improve Outcome: Not Progressing Goal: Knowledge of the prescribed therapeutic regimen will improve Outcome: Not Progressing   Problem: Activity: Goal: Interest or engagement in leisure activities will improve Outcome: Not Progressing Goal: Imbalance in normal sleep/wake cycle will improve Outcome: Not Progressing   Problem: Coping: Goal: Coping ability will improve Outcome: Not Progressing Goal: Will verbalize feelings Outcome: Not Progressing   Problem: Health Behavior/Discharge Planning: Goal: Ability to make decisions will improve Outcome: Not Progressing Goal: Compliance with therapeutic regimen will improve Outcome: Not Progressing   Problem: Role Relationship: Goal: Will demonstrate positive changes in social behaviors and relationships Outcome: Not Progressing   Problem: Safety: Goal: Ability to disclose and discuss suicidal ideas will improve Outcome: Not Progressing Goal: Ability to identify and utilize support systems that promote safety will improve Outcome: Not Progressing   Problem: Self-Concept: Goal: Will verbalize positive feelings about self Outcome: Not Progressing Goal: Level of anxiety will decrease Outcome: Not Progressing   Problem: Activity: Goal: Will verbalize the importance of balancing activity with adequate rest periods Outcome: Not Progressing   Problem: Education: Goal: Will be free of psychotic symptoms Outcome: Not Progressing Goal: Knowledge of the prescribed therapeutic regimen will improve Outcome: Not Progressing   Problem: Coping: Goal: Coping ability will improve Outcome: Not Progressing Goal: Will verbalize feelings Outcome: Not Progressing   Problem: Health Behavior/Discharge Planning: Goal: Compliance with prescribed medication regimen will improve Outcome: Not Progressing   Problem: Nutritional: Goal: Ability to achieve  adequate nutritional intake will improve Outcome: Not Progressing   Problem: Role Relationship: Goal: Ability to communicate needs accurately will improve Outcome: Not Progressing Goal: Ability to interact with others will improve Outcome: Not Progressing   Problem: Safety: Goal: Ability to redirect hostility and anger into socially appropriate behaviors will improve Outcome: Not Progressing Goal: Ability to remain free from injury will improve Outcome: Not Progressing   Problem: Self-Care: Goal: Ability to participate in self-care as condition permits will improve Outcome: Not Progressing   Problem: Self-Concept: Goal: Will verbalize positive feelings about self Outcome: Not Progressing

## 2023-10-28 NOTE — ED Provider Notes (Signed)
 Emergency Medicine Observation Re-evaluation Note  Stephanie Anthony is a 89 y.o. female, seen on rounds today.  Pt initially presented to the ED for complaints of Hallucinations Currently, the patient is resting in no distress.  Physical Exam  BP (!) 152/70 (BP Location: Right Arm)   Pulse 75   Temp 98.1 F (36.7 C) (Oral)   Resp 18   Ht 5' 4 (1.626 m)   Wt 79.3 kg   SpO2 98%   BMI 30.01 kg/m  Physical Exam General: Resting in no distress   ED Course / MDM  EKG:   I have reviewed the labs performed to date as well as medications administered while in observation.  Recent changes in the last 24 hours include no acute events.  Plan  Current plan is for disposition per psychiatry.    Collis Deaner, MD 10/28/23 1549

## 2023-10-28 NOTE — ED Notes (Signed)
 Pt eating breakfast at this time. Pt assisted to bathroom. Pt provided pericare. Pt ambulatory with x1 assit to bathroom.

## 2023-10-28 NOTE — ED Notes (Addendum)
 Pt moved to 24H. Ambulatory with steady gait.

## 2023-10-28 NOTE — Group Note (Signed)
 Date:  10/28/2023 Time:  9:43 PM  Group Topic/Focus:  Wrap-Up Group:   The focus of this group is to help patients review their daily goal of treatment and discuss progress on daily workbooks.    Participation Level:  Active  Participation Quality:  Appropriate  Affect:  Appropriate  Cognitive:  Appropriate  Insight: Appropriate  Engagement in Group:  Engaged  Modes of Intervention:  Discussion    Bradley Caffey 10/28/2023, 9:43 PM

## 2023-10-28 NOTE — Progress Notes (Signed)
 Patient has been accepted to [facility] Patient assigned to room 34 Accepting physician is Dr. Jadapalle. Call report to [phone]  Representative was [facility] Danika.   ER Staff is aware of it: [facility] ER Secretary Dr. Drenda Gentle, ER MD Ivin Marrow, RN Patient's Nurse   Macky Sayres, [facility] [phone]

## 2023-10-28 NOTE — ED Notes (Signed)
 Pt states that she is hallucinating again. Pt reports seeing a black creature with beaty red eyes hanging off the wall staring at her. RN made aware.

## 2023-10-28 NOTE — ED Notes (Signed)
 Pt ambulated without assistance to and from bathroom.

## 2023-10-28 NOTE — ED Notes (Incomplete)
 Pt moved to 24H. Ambulatory with steady gait. No distress noted at this time. Bed alarm in place and pt alert and oriented

## 2023-10-28 NOTE — Telephone Encounter (Signed)
 According to the chart review, she is currently in the ED. I will defer management to the team there and plan to follow up after her discharge.

## 2023-10-28 NOTE — Consult Note (Signed)
Admission orders completed.

## 2023-10-29 DIAGNOSIS — F3111 Bipolar disorder, current episode manic without psychotic features, mild: Secondary | ICD-10-CM

## 2023-10-29 MED ORDER — LEVOTHYROXINE SODIUM 100 MCG PO TABS
100.0000 ug | ORAL_TABLET | Freq: Every day | ORAL | Status: DC
  Administered 2023-10-30 – 2023-11-02 (×4): 100 ug via ORAL
  Filled 2023-10-29 (×4): qty 1

## 2023-10-29 MED ORDER — LAMOTRIGINE 25 MG PO TABS
25.0000 mg | ORAL_TABLET | Freq: Every day | ORAL | Status: DC
  Administered 2023-10-29 – 2023-11-05 (×8): 25 mg via ORAL
  Filled 2023-10-29 (×8): qty 1

## 2023-10-29 MED ORDER — ARIPIPRAZOLE 2 MG PO TABS: 2.0000 mg | ORAL_TABLET | Freq: Every day | ORAL | Status: DC

## 2023-10-29 MED ORDER — LATANOPROST 0.005 % OP SOLN
1.0000 [drp] | Freq: Every day | OPHTHALMIC | Status: DC
  Administered 2023-10-29 – 2023-11-04 (×7): 1 [drp] via OPHTHALMIC
  Filled 2023-10-29: qty 2.5

## 2023-10-29 MED ORDER — PANTOPRAZOLE SODIUM 40 MG PO TBEC
40.0000 mg | DELAYED_RELEASE_TABLET | Freq: Every day | ORAL | Status: DC
  Administered 2023-10-29 – 2023-11-05 (×8): 40 mg via ORAL
  Filled 2023-10-29 (×8): qty 1

## 2023-10-29 MED ORDER — PRAVASTATIN SODIUM 10 MG PO TABS
10.0000 mg | ORAL_TABLET | Freq: Every day | ORAL | Status: DC
  Administered 2023-10-29 – 2023-11-04 (×7): 10 mg via ORAL
  Filled 2023-10-29 (×8): qty 1

## 2023-10-29 MED ORDER — LOSARTAN POTASSIUM 25 MG PO TABS
25.0000 mg | ORAL_TABLET | Freq: Every day | ORAL | Status: DC
  Administered 2023-10-29 – 2023-11-05 (×8): 25 mg via ORAL
  Filled 2023-10-29 (×8): qty 1

## 2023-10-29 MED ORDER — LAMOTRIGINE 100 MG PO TABS
100.0000 mg | ORAL_TABLET | Freq: Every day | ORAL | Status: DC
  Administered 2023-10-29 – 2023-11-04 (×7): 100 mg via ORAL
  Filled 2023-10-29 (×7): qty 1

## 2023-10-29 NOTE — H&P (Addendum)
 Psychiatric Admission Assessment Adult  Patient Identification: Stephanie Anthony MRN:  [id number] Date of Evaluation:  10/29/2023 Chief Complaint:  Bipolar disorder [facility]) [F31.9]   History of Present Illness: Patient is a 89 y/o WF presents to ED with worsening hallucinations, mania.  She reports her psychiatrist told her to stop taking her aripiprazole  recently due to kidney function concerns.  However, review of medical records indicates this medication was  discontinued 09/29/22 due to TD and Parkinson symptoms.  The initial plan documented was to increase lamotrigine  if needed; however, appears she has been fairly stable on lamotrigine  current dose since discontinue of aripiprazole  until recently; she is noted to have a UTI which most likely has contributed to recent mania episode. Patient is admitted to [facility] unit with Q15 min safety monitoring. Multidisciplinary team approach is offered. Medication management; group/milieu therapy is offered.   On interview patient was pleasant but is noted to be tangential and very hard to complete the interview as she takes a long time to come to the topic.  She is very distractible and tangential and has hard time even answering to a specific question in yes or no.  Provider tried every strategy to keep her focused on the interview question but she goes back to 1970s and has hard time answering the question.  She is able to acknowledge that she is hearing racing thoughts, hyperverbal, too much energy.  On the way to her room she told one of the female staff that she likes him and she is going to flood with him even though he is married.  Per nursing report other patients are getting upset with patient's intrusiveness.  Patient informs the provider that she is from [place]  and that her husband died in 15-Nov-2000 talks about having 2 kids, her son living in [place] and the daughter.  She informs of her bipolar disorder and tries to go back to the history in 1970s  with no sensible information.  She does talk about visual hallucinations that started happening few days ago at home where she started noticing the doorknobs looked like snakes and also some of the wall stickers started moving around and looked differently with monster faces.  She reports in the last few days she stopped reading Bible, stopped going to church and she became tearful saying like she was a bad Christian in the last few days and she asked Jesus to forgive her and she knew he gave her the day.  Provider had to redirect her multiple times and eventually she was able to say she denies suicidal/homicidal ideation she denies auditory hallucinations.  She reports last time she saw unusual things like snakes inside of doorknobs was last night.  She apologizes multiple times but unable to stop talking and she reports she wants to stop talking but she cannot.  She denies feeling anxious or having any panic attacks.  She reports having history of depression.  She reports that her psychiatrist stopped Abilify  due to concerns for her kidney and when she went to see the nephrologist recently he found that patient is manic and asked her to go to the emergency room.  Patient is not able to line up the timelines and displays mood lability, tangential and distractibility at this time.  Patient informed the provider that the ED provider started her on a new medication which she could not remember that she takes morning and night.  Provider looked into the chart and updated her that they changed her  Abilify  to Seroquel.  Provider educated her about the risk of orthostatic hypotension on Seroquel and encouraged her to stay hydrated.  Collateral Son Blanchard [phone].  Son reported that patient was doing well until 3 days ago when she started reporting delusions where the things on the wall or the doorknobs started looking differently.  He also stated that she started cleaning the house all day every day.  He talked  about the UTI and the possibility of delirium causing the symptoms.  Provider educated him on delirium in geriatric folks due to UTI.  Provider updated on the treatment plan and updated on Seroquel added in the emergency room.  Total Time spent with patient: 1 hour Sleep  Sleep:Sleep: Fair  Past Psychiatric History:  Psychiatric History:  Information collected from patient/chart  Prev Dx/Sx: Bipolar disorder Current Psych Provider: Dr.Hisada Home Meds (current): Abilify , lamictal  Previous Med Trials: lithium Therapy: unknown  Prior Psych Hospitalization: 5 times last one in 2015  Prior Self Harm: denies Prior Violence: none reported  Family Psych History: dad with alcohol use Family Hx suicide: none reported  Social History:  Educational Hx: 6th grade Occupational Hx: on SSI Legal Hx: denies Living Situation: lives by self but has good support from son in Mining engineer Spiritual Hx: christian Access to weapons/lethal means: denies   Substance History Alcohol: denies   Tobacco: denies Illicit drugs: denies Prescription drug abuse: denies Rehab hx: denies Is the patient at risk to self? No.  Has the patient been a risk to self in the past 6 months? No.  Has the patient been a risk to self within the distant past? No.  Is the patient a risk to others? No.  Has the patient been a risk to others in the past 6 months? No.  Has the patient been a risk to others within the distant past? No.   [place] Scale:  Flowsheet Row Admission (Current) from 10/28/2023 in [facility] from 10/27/2023 in [facility] Emergency Department at [facility] ED from 06/29/2023 in [facility] Emergency Department at [facility]  C-SSRS RISK CATEGORY No Risk No Risk No Risk     Past Medical History:  Past Medical History:  Diagnosis Date   Arthritis    Bipolar 1 disorder (HCC)    Cataracts, bilateral    Chronic kidney disease    stage 3   Chronic kidney  disease    Chronic mitral valve disease    Coronary artery disease    Deviated nasal septum    Erosive esophagitis    Erosive esophagitis    Esophageal stricture    Fibroid tumor    GERD (gastroesophageal reflux disease)    History of hiatal hernia    Hypertension    Hypothyroidism    Murmur, cardiac    Nephrogenic diabetes insipidus (HCC)    Pure hypercholesterolemia    Renal cyst, left    bilateral   Seasonal allergies     Past Surgical History:  Procedure Laterality Date   CARDIAC CATHETERIZATION     CHOLECYSTECTOMY     COLONOSCOPY WITH PROPOFOL  N/A 06/01/2018   Procedure: COLONOSCOPY WITH PROPOFOL ;  Surgeon: Deveron Fly, MD;  Location: [facility] ENDOSCOPY;  Service: Endoscopy;  Laterality: N/A;   ESOPHAGEAL DILATION     ESOPHAGOGASTRODUODENOSCOPY (EGD) WITH PROPOFOL  N/A 10/20/2014   Procedure: ESOPHAGOGASTRODUODENOSCOPY (EGD) WITH PROPOFOL ;  Surgeon: Deveron Fly, MD;  Location: [facility] ENDOSCOPY;  Service: Endoscopy;  Laterality: N/A;   ESOPHAGOGASTRODUODENOSCOPY (EGD) WITH  PROPOFOL  N/A 06/01/2018   Procedure: ESOPHAGOGASTRODUODENOSCOPY (EGD) WITH PROPOFOL ;  Surgeon: Deveron Fly, MD;  Location: [facility] ENDOSCOPY;  Service: Endoscopy;  Laterality: N/A;   INFECTED SKIN DEBRIDEMENT     on back several years ago   JOINT REPLACEMENT     KNEE ARTHROPLASTY Left 06/10/2023   Procedure: COMPUTER ASSISTED TOTAL KNEE ARTHROPLASTY;  Surgeon: Arlyne Lame, MD;  Location: [facility] ORS;  Service: Orthopedics;  Laterality: Left;   knee replacement, right     LAPAROSCOPIC APPENDECTOMY N/A 06/21/2018   Procedure: APPENDECTOMY LAPAROSCOPIC;  Surgeon: Eldred Grego, MD;  Location: [facility] ORS;  Service: General;  Laterality: N/A;   NOSE SURGERY     THYROIDECTOMY     TOTAL THYROIDECTOMY     Family History:  Family History  Problem Relation Age of Onset   Heart Problems Mother    Seizures Mother    Stroke Mother    Diverticulitis Mother    Alcohol abuse Father    Heart attack  Father    Heart attack Sister    Pancreatitis Sister    Cancer Sister    Cancer Brother     Social History:  Social History   Substance and Sexual Activity  Alcohol Use No   Alcohol/week: 0.0 standard drinks of alcohol     Social History   Substance and Sexual Activity  Drug Use No      Allergies:   Allergies  Allergen Reactions   Aspirin  Other (See Comments)    Rectal Bleeding   Prednisone Other (See Comments)    Hallucinations   Codeine Nausea And Vomiting   Valium [Diazepam] Other (See Comments)    Uknown    Lab Results:  Results for orders placed or performed during the hospital encounter of 10/27/23 (from the past 48 hours)  Comprehensive metabolic panel     Status: Abnormal   Collection Time: 10/27/23  3:48 PM  Result Value Ref Range   Sodium 143 135 - 145 mmol/L   Potassium 4.0 3.5 - 5.1 mmol/L   Chloride 112 (H) 98 - 111 mmol/L   CO2 21 (L) 22 - 32 mmol/L   Glucose, Bld 132 (H) 70 - 99 mg/dL    Comment: Glucose reference range applies only to samples taken after fasting for at least 8 hours.   BUN 26 (H) 8 - 23 mg/dL   Creatinine, Ser 4.09 (H) 0.44 - 1.00 mg/dL   Calcium 9.0 8.9 - 81.1 mg/dL   Total Protein 6.9 6.5 - 8.1 g/dL   Albumin 3.8 3.5 - 5.0 g/dL   AST 23 15 - 41 U/L   ALT 15 0 - 44 U/L   Alkaline Phosphatase 47 38 - 126 U/L   Total Bilirubin 0.6 0.0 - 1.2 mg/dL   GFR, Estimated 31 (L) >60 mL/min    Comment: (NOTE) Calculated using the CKD-EPI Creatinine Equation (2021)    Anion gap 10 5 - 15    Comment: Performed at [facility], [address] [place], [place] [address]  Ethanol     Status: None   Collection Time: 10/27/23  3:48 PM  Result Value Ref Range   Alcohol, Ethyl (B) <15 <15 mg/dL    Comment: (NOTE) For medical purposes only. Performed at [facility], [address] [place], [place] [address]   cbc     Status: Abnormal   Collection Time: 10/27/23  3:48 PM  Result Value Ref Range   WBC 7.7 4.0 -  10.5 K/uL  RBC 4.37 3.87 - 5.11 MIL/uL   Hemoglobin 10.9 (L) 12.0 - 15.0 g/dL   HCT 16.1 (L) 09.6 - 04.5 %   MCV 76.0 (L) 80.0 - 100.0 fL   MCH 24.9 (L) 26.0 - 34.0 pg   MCHC 32.8 30.0 - 36.0 g/dL   RDW 40.9 81.1 - 91.4 %   Platelets 226 150 - 400 K/uL   nRBC 0.0 0.0 - 0.2 %    Comment: Performed at [facility], [address], [place], [place] [address]  Urine Drug Screen, Qualitative     Status: None   Collection Time: 10/27/23  3:48 PM  Result Value Ref Range   Tricyclic, Ur Screen NONE DETECTED NONE DETECTED   Amphetamines, Ur Screen NONE DETECTED NONE DETECTED   MDMA (Ecstasy)Ur Screen NONE DETECTED NONE DETECTED   Cocaine Metabolite,Ur [place] NONE DETECTED NONE DETECTED   Opiate, Ur Screen NONE DETECTED NONE DETECTED   Phencyclidine (PCP) Ur S NONE DETECTED NONE DETECTED   Cannabinoid 50 Ng, Ur  NONE DETECTED NONE DETECTED   Barbiturates, Ur Screen NONE DETECTED NONE DETECTED   Benzodiazepine, Ur Scrn NONE DETECTED NONE DETECTED   Methadone Scn, Ur NONE DETECTED NONE DETECTED    Comment: (NOTE) Tricyclics + metabolites, urine    Cutoff 1000 ng/mL Amphetamines + metabolites, urine  Cutoff 1000 ng/mL MDMA (Ecstasy), urine              Cutoff 500 ng/mL Cocaine Metabolite, urine          Cutoff 300 ng/mL Opiate + metabolites, urine        Cutoff 300 ng/mL Phencyclidine (PCP), urine         Cutoff 25 ng/mL Cannabinoid, urine                 Cutoff 50 ng/mL Barbiturates + metabolites, urine  Cutoff 200 ng/mL Benzodiazepine, urine              Cutoff 200 ng/mL Methadone, urine                   Cutoff 300 ng/mL  The urine drug screen provides only a preliminary, unconfirmed analytical test result and should not be used for non-medical purposes. Clinical consideration and professional judgment should be applied to any positive drug screen result due to possible interfering substances. A more specific alternate chemical method must be used in order to obtain a  confirmed analytical result. Gas chromatography / mass spectrometry (GC/MS) is the preferred confirm atory method. Performed at [facility], [address] [place], [place] [address]   Urinalysis, Complete w Microscopic -Urine, Clean Catch     Status: Abnormal   Collection Time: 10/27/23  3:48 PM  Result Value Ref Range   Color, Urine STRAW (A) YELLOW   APPearance CLEAR (A) CLEAR   Specific Gravity, Urine 1.008 1.005 - 1.030   pH 6.0 5.0 - 8.0   Glucose, UA NEGATIVE NEGATIVE mg/dL   Hgb urine dipstick NEGATIVE NEGATIVE   Bilirubin Urine NEGATIVE NEGATIVE   Ketones, ur NEGATIVE NEGATIVE mg/dL   Protein, ur NEGATIVE NEGATIVE mg/dL   Nitrite NEGATIVE NEGATIVE   Leukocytes,Ua SMALL (A) NEGATIVE   RBC / HPF 0-5 0 - 5 RBC/hpf   WBC, UA 6-10 0 - 5 WBC/hpf   Bacteria, UA RARE (A) NONE SEEN   Squamous Epithelial / HPF 0-5 0 - 5 /HPF    Comment: Performed at [facility], [address], [place], [place] [address]  Resp panel by RT-PCR (RSV, Flu A&B, Covid) Anterior Nasal Swab     Status: None   Collection Time: 10/28/23  8:48 AM   Specimen: Anterior Nasal Swab  Result Value Ref Range   SARS Coronavirus 2 by RT PCR NEGATIVE NEGATIVE    Comment: (NOTE) SARS-CoV-2 target nucleic acids are NOT DETECTED.  The SARS-CoV-2 RNA is generally detectable in upper respiratory specimens during the acute phase of infection. The lowest concentration of SARS-CoV-2 viral copies this assay can detect is 138 copies/mL. A negative result does not preclude SARS-Cov-2 infection and should not be used as the sole basis for treatment or other patient management decisions. A negative result may occur with  improper specimen collection/handling, submission of specimen other than nasopharyngeal swab, presence of viral mutation(s) within the areas targeted by this assay, and inadequate number of viral copies(<138 copies/mL). A negative result must be combined with clinical observations,  patient history, and epidemiological information. The expected result is Negative.  Fact Sheet for Patients:  BloggerCourse.com  Fact Sheet for Healthcare Providers:  SeriousBroker.it  This test is no t yet approved or cleared by the [place]  FDA and  has been authorized for detection and/or diagnosis of SARS-CoV-2 by FDA under an Emergency Use Authorization (EUA). This EUA will remain  in effect (meaning this test can be used) for the duration of the COVID-19 declaration under Section 564(b)(1) of the Act, 21 U.S.C.section 360bbb-3(b)(1), unless the authorization is terminated  or revoked sooner.       Influenza A by PCR NEGATIVE NEGATIVE   Influenza B by PCR NEGATIVE NEGATIVE    Comment: (NOTE) The Xpert Xpress SARS-CoV-2/FLU/RSV plus assay is intended as an aid in the diagnosis of influenza from Nasopharyngeal swab specimens and should not be used as a sole basis for treatment. Nasal washings and aspirates are unacceptable for Xpert Xpress SARS-CoV-2/FLU/RSV testing.  Fact Sheet for Patients: BloggerCourse.com  Fact Sheet for Healthcare Providers: SeriousBroker.it  This test is not yet approved or cleared by the [place]  FDA and has been authorized for detection and/or diagnosis of SARS-CoV-2 by FDA under an Emergency Use Authorization (EUA). This EUA will remain in effect (meaning this test can be used) for the duration of the COVID-19 declaration under Section 564(b)(1) of the Act, 21 U.S.C. section 360bbb-3(b)(1), unless the authorization is terminated or revoked.     Resp Syncytial Virus by PCR NEGATIVE NEGATIVE    Comment: (NOTE) Fact Sheet for Patients: BloggerCourse.com  Fact Sheet for Healthcare Providers: SeriousBroker.it  This test is not yet approved or cleared by the [place]  FDA and has  been authorized for detection and/or diagnosis of SARS-CoV-2 by FDA under an Emergency Use Authorization (EUA). This EUA will remain in effect (meaning this test can be used) for the duration of the COVID-19 declaration under Section 564(b)(1) of the Act, 21 U.S.C. section 360bbb-3(b)(1), unless the authorization is terminated or revoked.  Performed at [facility], [address] [place] [address] [place], [place] [address]     Blood Alcohol level:  Lab Results  Component Value Date   [facility] <15 10/27/2023   ETH <5 07/07/2015    Metabolic Disorder Labs:  No results found for: HGBA1C, MPG No results found for: PROLACTIN No results found for: CHOL, TRIG, HDL, CHOLHDL, VLDL, LDLCALC  Current Medications: Current Facility-Administered Medications  Medication Dose Route Frequency Provider Last Rate Last Admin   acetaminophen  (TYLENOL ) tablet 650 mg  650 mg Oral Q6H PRN Monroe Antigua, NP  alum & mag hydroxide-simeth (MAALOX/MYLANTA) 200-200-20 MG/5ML suspension 30 mL  30 mL Oral Q4H PRN Penn, Cicely, NP       cephALEXin  (KEFLEX ) capsule 500 mg  500 mg Oral Q12H Penn, Cicely, NP   500 mg at 10/29/23 5784   lamoTRIgine  (LAMICTAL ) tablet 100 mg  100 mg Oral QHS Magnus Crescenzo, MD       lamoTRIgine  (LAMICTAL ) tablet 25 mg  25 mg Oral Daily Suellen Durocher, MD       latanoprost  (XALATAN ) 0.005 % ophthalmic solution 1 drop  1 drop Both Eyes QHS Jayziah Bankhead, MD       [START ON 10/30/2023] levothyroxine  (SYNTHROID ) tablet 100 mcg  100 mcg Oral Q0600 Kelie Gainey, MD       losartan  (COZAAR ) tablet 25 mg  25 mg Oral Daily Jacaria Colburn, MD   25 mg at 10/29/23 1025   OLANZapine (ZYPREXA) injection 5 mg  5 mg Intramuscular TID PRN Monroe Antigua, NP       OLANZapine zydis (ZYPREXA) disintegrating tablet 5 mg  5 mg Oral TID PRN Penn, Alaine Howells, NP       pantoprazole  (PROTONIX ) EC tablet 40 mg  40 mg Oral Daily Naoki Migliaccio, MD   40 mg at 10/29/23 1025   pravastatin  (PRAVACHOL )  tablet 10 mg  10 mg Oral q1800 Thedford Bunton, MD       QUEtiapine (SEROQUEL) tablet 25 mg  25 mg Oral Q breakfast Penn, Cicely, NP   25 mg at 10/29/23 6962   QUEtiapine (SEROQUEL) tablet 50 mg  50 mg Oral QHS Penn, Cicely, NP   50 mg at 10/28/23 2114   traZODone (DESYREL) tablet 50 mg  50 mg Oral QHS PRN Monroe Antigua, NP   50 mg at 10/28/23 2114   PTA Medications: Medications Prior to Admission  Medication Sig Dispense Refill Last Dose/Taking   ARIPiprazole  (ABILIFY ) 2 MG tablet Take 1 tablet (2 mg total) by mouth at bedtime. 30 tablet 0    lamoTRIgine  (LAMICTAL ) 100 MG tablet Take 1 tablet (100 mg total) by mouth at bedtime. 90 tablet 0    latanoprost  (XALATAN ) 0.005 % ophthalmic solution Place 1 drop into both eyes at bedtime.      levothyroxine  (SYNTHROID ) 100 MCG tablet Take 100 mcg by mouth daily before breakfast.      losartan  (COZAAR ) 25 MG tablet Take 25 mg by mouth daily.      lovastatin (MEVACOR) 40 MG tablet Take 40 mg by mouth at bedtime.   6    pantoprazole  (PROTONIX ) 40 MG tablet Take 40 mg by mouth daily.        Psychiatric Specialty Exam:  Presentation  General Appearance:  Appropriate for Environment; Casual  Eye Contact: Fair  Speech: Pressured  Speech Volume: Increased    Mood and Affect  Mood: Euphoric  Affect: Appropriate   Thought Process  Thought Processes: Irrevelant; Goal Directed  Descriptions of Associations:Intact  Orientation:Partial  Thought Content:Illogical  Hallucinations:Hallucinations: Visual  Ideas of Reference:None  Suicidal Thoughts:Suicidal Thoughts: No  Homicidal Thoughts:Homicidal Thoughts: No   Sensorium  Memory: Immediate Fair; Recent Fair; Remote Poor  Judgment: Impaired  Insight: Shallow   Executive Functions  Concentration: Poor  Attention Span: Poor  Recall: [employer] of Knowledge: Fair  Language: Fair   Psychomotor Activity  Psychomotor Activity: Psychomotor Activity:  Normal   Assets  Assets: Communication Skills; Desire for Improvement; Resilience; Social Support    Musculoskeletal: Strength & Muscle Tone: decreased Gait & Station: unsteady  Physical Exam: Physical Exam Vitals and nursing note reviewed.  HENT:     Head: Normocephalic.     Nose: Nose normal.     Mouth/Throat:     Mouth: Mucous membranes are moist.   Cardiovascular:     Pulses: Normal pulses.  Pulmonary:     Effort: Pulmonary effort is normal.    Review of Systems  Constitutional: Negative.   HENT: Negative.    Eyes: Negative.   Respiratory: Negative.    Cardiovascular: Negative.   Skin: Negative.    Blood pressure (!) 159/76, pulse 92, temperature 98.1 F (36.7 C), resp. rate 18, height 5' 7 (1.702 m), weight 80.3 kg, SpO2 97%. Body mass index is 27.72 kg/m.  Principal Diagnosis: Bipolar disorder (HCC) Diagnosis:  Principal Problem:   Bipolar disorder [facility])   Clinical Decision Making: Patient with history of bipolar disorder currently having UTI started having visual hallucinations in the context of delirium, currently admitted to inpatient psychiatric unit for worsening manic symptoms.  Patient is displaying ongoing mania with tangential speech, distractibility and mood lability.  Patient needs ongoing psychiatric hospitalization.  Treatment Plan Summary:  Safety and Monitoring:             -- Voluntary admission to inpatient psychiatric unit for safety, stabilization and treatment             -- Daily contact with patient to assess and evaluate symptoms and progress in treatment             -- Patient's case to be discussed in multi-disciplinary team meeting             -- Observation Level: q15 minute checks             -- Vital signs:  q12 hours             -- Precautions: suicide, elopement, and assault   2. Psychiatric Diagnoses and Treatment:                Ed added Seroquel 25 QAM and 50 mg at bedtime Increased Lamictal  to 25 mg QAM and 100mg   QHS   -- The risks/benefits/side-effects/alternatives to this medication were discussed in detail with the patient and time was given for questions. The patient consents to medication trial.                -- Metabolic profile and EKG monitoring obtained while on an atypical antipsychotic (BMI: Lipid Panel: HbgA1c: QTc:)              -- Encouraged patient to participate in unit milieu and in scheduled group therapies                            3. Medical Issues Being Addressed:     UTI On Keflex  4. Discharge Planning:              -- Social work and case management to assist with discharge planning and identification of hospital follow-up needs prior to discharge             -- Estimated LOS: 5-7 days             -- Discharge Concerns: Need to establish a safety plan; Medication compliance and effectiveness             -- Discharge Goals: Return home with outpatient referrals follow ups  Physician Treatment Plan for Primary Diagnosis: Bipolar disorder (HCC)  Long Term Goal(s): Improvement in symptoms so as ready for discharge  Short Term Goals: Ability to identify changes in lifestyle to reduce recurrence of condition will improve, Ability to verbalize feelings will improve, Ability to disclose and discuss suicidal ideas, Ability to demonstrate self-control will improve, and Ability to identify and develop effective coping behaviors will improve  Physician Treatment Plan for Secondary Diagnosis: Principal Problem:   Bipolar disorder (HCC)  Long Term Goal(s): Improvement in symptoms so as ready for discharge  Short Term Goals: Ability to identify changes in lifestyle to reduce recurrence of condition will improve, Ability to verbalize feelings will improve, Ability to disclose and discuss suicidal ideas, Ability to demonstrate self-control will improve, Ability to identify and develop effective coping behaviors will improve, Ability to maintain clinical measurements within normal limits will  improve, and Compliance with prescribed medications will improve  I certify that inpatient services furnished can reasonably be expected to improve the patient's condition.    Dezra Mandella, MD 6/12/202511:48 AM

## 2023-10-29 NOTE — Group Note (Signed)
 Date:  10/29/2023 Time:  8:37 PM  Group Topic/Focus:  Goals Group:   The focus of this group is to help patients establish daily goals to achieve during treatment and discuss how the patient can incorporate goal setting into their daily lives to aide in recovery.    Participation Level:  Active  Participation Quality:  Appropriate  Affect:  Appropriate  Cognitive:  Appropriate  Insight: Appropriate and Good  Engagement in Group:  Engaged  Modes of Intervention:  Discussion  Additional Comments:    Lynette Saras 10/29/2023, 8:37 PM

## 2023-10-29 NOTE — Group Note (Signed)
 Recreation Therapy Group Note   Group Topic:Health and Wellness  Group Date: 10/29/2023 Start Time: 1100 End Time: 1135 Facilitators: Deatrice Factor, LRT, CTRS Location: Courtyard  Group Description: Outdoor Recreation. Patients had the option to play corn hole, ring toss, bowling or listening to music while outside in the courtyard getting fresh air and sunlight. Patients helped water and prune the raised garden beds. LRT and patients discussed things that they enjoy doing in their free time outside of the hospital. LRT encouraged patients to drink water after being active and getting their heart rate up.   Goal Area(s) Addressed: Patient will identify leisure interests.  Patient will practice healthy decision making. Patient will engage in recreation activity.   Affect/Mood: Appropriate   Participation Level: Active   Participation Quality: Independent   Behavior: Appropriate   Speech/Thought Process: Coherent   Insight: Fair   Judgement: Fair    Modes of Intervention: Activity   Patient Response to Interventions:  Attentive and Receptive   Education Outcome:  Acknowledges education   Clinical Observations/Individualized Feedback: Stephanie Anthony was active in their participation of session activities and group discussion. Pt interacted well with LRT and peers duration of session.    Plan: Continue to engage patient in RT group sessions 2-3x/week.   Deatrice Factor, LRT, CTRS 10/29/2023 2:27 PM

## 2023-10-29 NOTE — Evaluation (Signed)
 Occupational Therapy Evaluation Patient Details Name: Stephanie Anthony MRN: [id number] DOB: 12-18-1933 Today's Date: 10/29/2023   History of Present Illness   Pt is a 89 y.o. female who presented to the ED for hallucinations. Pt has a PMH bipolar disorder, CKD, hyperlipidemia. MD diagnoses includes hallucinations, acute cystitis without hematuria, and bipolar affective disorder.     Clinical Impressions Chart reviewed to date, pt greeted in day room, alert and oriented x4, hyperverbal, agreeable to OT evaluation. Pt is known to this therapist from recent TKA. PTA pt is MOD I in ADL (sponge bathes at baseline), has PRN assist for IADL from her son who comes to see her 2x a month to help with grocery shopping, etc but she manages her medications per her report. Pt is performing ADL at a supervision level at this time, will benefit from OT to further assess ADL/IADL management to facilitate optimal functional performance. Pt is left in care of PT, all needs met. OT will follow acutely.      If plan is discharge home, recommend the following:   A little help with bathing/dressing/bathroom;Assistance with cooking/housework;Direct supervision/assist for medications management;Direct supervision/assist for financial management     Functional Status Assessment   Patient has had a recent decline in their functional status and demonstrates the ability to make significant improvements in function in a reasonable and predictable amount of time.     Equipment Recommendations   None recommended by OT     Recommendations for Other Services         Precautions/Restrictions   Precautions Precautions: Fall Recall of Precautions/Restrictions: Impaired     Mobility Bed Mobility Overal bed mobility: Modified Independent                  Transfers Overall transfer level: Needs assistance Equipment used: Rolling walker (2 wheels) Transfers: Sit to/from Stand Sit to Stand:  Contact guard assist                  Balance Overall balance assessment: Needs assistance Sitting-balance support: Feet supported Sitting balance-Leahy Scale: Good     Standing balance support: Bilateral upper extremity supported, During functional activity Standing balance-Leahy Scale: Good                             ADL either performed or assessed with clinical judgement   ADL Overall ADL's : Needs assistance/impaired Eating/Feeding: Supervision/ safety   Grooming: Supervision/safety;Sitting;Standing Grooming Details (indicate cue type and reason): anticipate with RW             Lower Body Dressing: Supervision/safety;Sitting/lateral leans Lower Body Dressing Details (indicate cue type and reason): donn/doff socks Toilet Transfer: Supervision/safety;Contact guard assist;Rolling walker (2 wheels) Toilet Transfer Details (indicate cue type and reason): simulated         Functional mobility during ADLs: Supervision/safety;Rolling walker (2 wheels) (approx 100' with RW)       Vision Patient Visual Report: No change from baseline       Perception         Praxis         Pertinent Vitals/Pain Pain Assessment Pain Assessment: No/denies pain     Extremity/Trunk Assessment Upper Extremity Assessment Upper Extremity Assessment: Overall WFL for tasks assessed   Lower Extremity Assessment Lower Extremity Assessment: Generalized weakness (edema throughout BLE)       Communication Communication Communication: No apparent difficulties   Cognition Arousal: Alert Behavior During Therapy: [facility] for  tasks assessed/performed (hyperverbal) Cognition: Cognition impaired         Attention impairment (select first level of impairment): Sustained attention Executive functioning impairment (select all impairments): Reasoning OT - Cognition Comments: pt reports she is hyper, appears relatively self aware of current level of functioning but  reasoning appears impaired; will continue to assess                 Following commands: Intact       Cueing  General Comments   Cueing Techniques: Verbal cues      Exercises Other Exercises Other Exercises: edu re: role of OT, role of rehab, discharge recommendations   Shoulder Instructions      Home Living Family/patient expects to be discharged to:: Private residence Living Arrangements: Alone Available Help at Discharge: Family;Available PRN/intermittently Type of Home: House Home Access: Stairs to enter [employer] of Steps: 4 Entrance Stairs-Rails: Right;Left;Can reach both Home Layout: One level     Bathroom Shower/Tub: Sponge bathes at baseline   Bathroom Toilet: Handicapped height Bathroom Accessibility: Yes   Home Equipment: Other (comment)   Additional Comments: unsure due to cognitive deficits      Prior Functioning/Environment Prior Level of Function : Patient poor historian/Family not available             Mobility Comments: unable to assess due to pt being poor historian ADLs Comments: Pt stated that her son comes to town every 2 weeks to assist with ADLs    OT Problem List: Decreased activity tolerance;Decreased knowledge of use of DME or AE;Decreased safety awareness;Decreased cognition   OT Treatment/Interventions: Self-care/ADL training;DME and/or AE instruction;Therapeutic activities;Balance training;Energy conservation;Patient/family education;Cognitive remediation/compensation      OT Goals(Current goals can be found in the care plan section)   Acute Rehab OT Goals Patient Stated Goal: figure out meds OT Goal Formulation: With patient Time For Goal Achievement: 11/12/23 Potential to Achieve Goals: Good ADL Goals Pt Will Perform Grooming: Independently;with modified independence;sitting;standing Pt Will Perform Lower Body Dressing: with modified independence;sit to/from stand;sitting/lateral leans Additional ADL  Goal #1: Pt will participate in pill box assessment/ongoing cognitive assessment in order to facilitate optimal IADL performance   OT Frequency:  Min 1X/week    Co-evaluation              AM-PAC OT 6 Clicks Daily Activity     Outcome Measure Help from another person eating meals?: None Help from another person taking care of personal grooming?: None Help from another person toileting, which includes using toliet, bedpan, or urinal?: None Help from another person bathing (including washing, rinsing, drying)?: A Little Help from another person to put on and taking off regular upper body clothing?: None Help from another person to put on and taking off regular lower body clothing?: A Little 6 Click Score: 22   End of Session Equipment Utilized During Treatment: Rolling walker (2 wheels) Nurse Communication: Mobility status  Activity Tolerance: Patient tolerated treatment well Patient left: Other (comment) (amb in hallway in care of PT)  OT Visit Diagnosis: Other abnormalities of gait and mobility (R26.89)                Time: 1610-9604 OT Time Calculation (min): 13 min Charges:  OT General Charges $OT Visit: 1 Visit OT Evaluation $OT Eval Low Complexity: 1 Low  Gerre Kraft, OTD OTR/L  10/29/23, 3:39 PM

## 2023-10-29 NOTE — Group Note (Signed)
 LCSW Group Therapy Note  Group Date: 10/29/2023 Start Time: 1315 End Time: 1400   Type of Therapy and Topic:  Group Therapy - Healthy vs Unhealthy Coping Skills  Participation Level:  Active   Description of Group The focus of this group was to determine what unhealthy coping techniques typically are used by group members and what healthy coping techniques would be helpful in coping with various problems. Patients were guided in becoming aware of the differences between healthy and unhealthy coping techniques. Patients were asked to identify 2-3 healthy coping skills they would like to learn to use more effectively.  Therapeutic Goals Patients learned that coping is what human beings do all day long to deal with various situations in their lives Patients defined and discussed healthy vs unhealthy coping techniques Patients identified their preferred coping techniques and identified whether these were healthy or unhealthy Patients determined 2-3 healthy coping skills they would like to become more familiar with and use more often. Patients provided support and ideas to each other   Summary of Patient Progress:  During group, Stephanie Anthony expressed feeling sad about dealing with emotional rollercoasters reporting that she has been diagnosed with Bipolar after her children were born. Patient proved open to input from peers and feedback from CSW. Patient demonstrated fair insight into the subject matter, was respectful of peers, and participated throughout the entire session.   Therapeutic Modalities Cognitive Behavioral Therapy Motivational Interviewing  Claudio Culver, [place] 10/29/2023  1:56 PM

## 2023-10-29 NOTE — BHH Suicide Risk Assessment (Signed)
 [facility], [place] Admission Suicide Risk Assessment   Nursing information obtained from:  Patient Demographic factors:  Age 89 or older, Caucasian, Divorced or widowed, Living alone, Unemployed Current Mental Status:  NA Loss Factors:  Decline in physical health Historical Factors:  Impulsivity Risk Reduction Factors:  Religious beliefs about death, Positive social support  Total Time spent with patient: 30 minutes Principal Problem: Bipolar disorder (HCC) Diagnosis:  Principal Problem:   Bipolar disorder (HCC)  Subjective Data: patient presents to ED with worsening hallucinations, mania.  She reports her psychiatrist told her to stop taking her aripiprazole  recently due to kidney function concerns.  However, review of medical records indicates this medication was  discontinued 09/29/22 due to TD and Parkinson symptoms.  The initial plan documented was to increase lamotrigine  if needed; however, appears she has been fairly stable on lamotrigine  current dose since discontinue of aripiprazole  until recently; she is noted to have a UTI which most likely has contributed to recent mania episode;   Continued Clinical Symptoms:  Alcohol Use Disorder Identification Test Final Score (AUDIT): 0 The Alcohol Use Disorders Identification Test, Guidelines for Use in Primary Care, Second Edition.  World Science writer [facility]). Score between 0-7:  no or low risk or alcohol related problems. Score between 8-15:  moderate risk of alcohol related problems. Score between 16-19:  high risk of alcohol related problems. Score 20 or above:  warrants further diagnostic evaluation for alcohol dependence and treatment.   CLINICAL FACTORS:   Bipolar Disorder:   Bipolar II   Musculoskeletal: Strength & Muscle Tone: decreased Gait & Station: unsteady Patient leans: N/A  Psychiatric Specialty Exam:  Presentation  General Appearance:  Appropriate for Environment; Casual  Eye  Contact: Fair  Speech: Pressured  Speech Volume: Increased  Handedness: Right   Mood and Affect  Mood: Euphoric  Affect: Appropriate   Thought Process  Thought Processes: Irrevelant; Goal Directed  Descriptions of Associations:Intact  Orientation:Partial  Thought Content:Illogical  History of Schizophrenia/Schizoaffective disorder:No  Duration of Psychotic Symptoms:Less than six months  Hallucinations:Hallucinations: Visual  Ideas of Reference:None  Suicidal Thoughts:Suicidal Thoughts: No  Homicidal Thoughts:Homicidal Thoughts: No   Sensorium  Memory: Immediate Fair; Recent Fair; Remote Poor  Judgment: Impaired  Insight: Shallow   Executive Functions  Concentration: Poor  Attention Span: Poor  Recall: [employer] of Knowledge: Fair  Language: Fair   Psychomotor Activity  Psychomotor Activity: Psychomotor Activity: Normal   Assets  Assets: Communication Skills; Desire for Improvement; Resilience; Social Support   Sleep  Sleep: Sleep: Fair    Physical Exam: Physical Exam Vitals and nursing note reviewed.    ROS Blood pressure (!) 159/76, pulse 92, temperature 98.1 F (36.7 C), resp. rate 18, height 5' 7 (1.702 m), weight 80.3 kg, SpO2 97%. Body mass index is 27.72 kg/m.   COGNITIVE FEATURES THAT CONTRIBUTE TO RISK:  None    SUICIDE RISK:   Minimal: No identifiable suicidal ideation.  Patients presenting with no risk factors but with morbid ruminations; may be classified as minimal risk based on the severity of the depressive symptoms  PLAN OF CARE: Patient is admitted to [facility] psych unit with Q15 min safety monitoring. Multidisciplinary team approach is offered. Medication management; group/milieu therapy is offered.   I certify that inpatient services furnished can reasonably be expected to improve the patient's condition.   Aurelia Blotter, MD 10/29/2023, 11:41 AM

## 2023-10-29 NOTE — Group Note (Signed)
 Date:  10/29/2023 Time:  10:18 AM  Group Topic/Focus:  Movent Therapy    Participation Level:  Active  Participation Quality:  Appropriate  Affect:  Appropriate  Cognitive:  Appropriate  Insight: Appropriate  Engagement in Group:  Engaged  Modes of Intervention:  Activity  Additional Comments:  none  Merton Abts 10/29/2023, 10:18 AM

## 2023-10-29 NOTE — Group Note (Signed)
 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 Patients and LRT discuss what it means to be "at peace", what it feels like physically and mentally. Pts are given a canvas and watercolor paint to use and encouraged to draw their idea of a peaceful place. Pts and LRT discuss how they use this in their daily life post discharge. Pts are encouraged to take their canvas home with them as a reminder to find their peaceful place whenever they are feeling depressed, anxious, etc.    Goal Area(s) Addressed:  Patient will identify what it means to experience a "peaceful" emotion. Patient will identify a new coping skill.  Patient will express their emotions through art. Patients will increase communication by talking with LRT and peers while in group.   Affect/Mood: Appropriate   Participation Level: Active    Clinical Observations/Individualized Feedback: Stephanie Anthony came late to group after meeting with OT/PT. Pt shared that her peaceful place is church. Pt interacted well with LRT and peers duration of session.   Plan: Continue to engage patient in RT group sessions 2-3x/week.   Stephanie Anthony, LRT, CTRS 10/29/2023 4:30 PM

## 2023-10-29 NOTE — Progress Notes (Signed)
 Patient is cooperative and interacting with staff and peers on the unit. Patient is noted to be euphoric and talking continuously, however patient remains polite to staff and peers. Patient denies SI/HI/AH/VH. Patient is satisfied with current medications and compliant- she is hopeful that they will help.

## 2023-10-29 NOTE — Evaluation (Signed)
 Physical Therapy Evaluation Patient Details Name: Stephanie Anthony MRN: [id number] DOB: 10/12/1933 Today's Date: 10/29/2023  History of Present Illness  Pt is a 89 y.o. female who presented to the ED for hallucinations. Pt has a PMH bipolar disorder, CKD, hyperlipidemia. MD diagnoses includes hallucinations, acute cystitis without hematuria, and bipolar affective disorder.  Clinical Impression  Pt was pleasant and motivated to participate with PT today. Pt completed STS from EOB independently with no physical assistance or AD. Pt ambulated 100 feet independently with RW and no LOB noted. Then, pt ambulated 100 feet independently with no AD and remained steady throughout. Pt was able to maintain balance during dynamic balance activities (narrow BOS for 10 seconds, walking backwards with HHA, eyes closed for 10 seconds, eyes closed for 10 seconds with mild perturbations from PT, heel toe walking with HHA.) Pt ambulates with functional safety and confidence, handles moderate standing balance challenges without LOB or safety concerns. Pt does not demonstrate further need for skilled PT services. Will complete orders and sign off.            If plan is discharge home, recommend the following: Assist for transportation;Direct supervision/assist for medications management;Direct supervision/assist for financial management;Supervision due to cognitive status;Assistance with cooking/housework   Can travel by private vehicle        Equipment Recommendations None  Recommendations for Other Services       Functional Status Assessment Patient has not had a recent decline in their functional status     Precautions / Restrictions Precautions Precautions: None Restrictions Weight Bearing Restrictions Per Provider Order: No      Mobility  Bed Mobility               General bed mobility comments: Not assessed; pt received sitting EOB    Transfers Overall transfer level:  Independent Equipment used: None Transfers: Sit to/from Stand Sit to Stand: Independent           General transfer comment: Pt completed STS from EOB independently with no AD and no LOB    Ambulation/Gait Ambulation/Gait assistance: Independent Gait Distance (Feet): 100 Feet with RW, 100 feet with no AD Assistive device: Rolling walker (2 wheels) for 100 feet Gait Pattern/deviations: WFL(Within Functional Limits)          Stairs            Wheelchair Mobility     Tilt Bed    Modified Rankin (Stroke Patients Only)       Balance Overall balance assessment: Independent Sitting-balance support: No upper extremity supported, Feet supported Sitting balance-Leahy Scale: Normal     Standing balance support: No upper extremity supported Standing balance-Leahy Scale: Normal Standing balance comment: Pt able to maintain static standing without AD and no LOB noted               High Level Balance Comments: Pt able to maintain balance with narrow BOS for 10 seconds, walking backwards with HHA, eyes closed for 10 seconds, eyes closed for 10 seconds with mild perturbations from PT, heel toe walking with HHA,              Pertinent Vitals/Pain Pain Assessment Pain Assessment: Faces Faces Pain Scale: No hurt    Home Living Family/patient expects to be discharged to:: Private residence Living Arrangements: Alone Available Help at Discharge: Family;Available PRN/intermittently (son visits every 2 weeks to help with shopping, etc.) Type of Home: House Home Access: Stairs to enter Entrance Stairs-Rails: Right;Left;Can reach both Entrance [employer]  of Steps: 4   Home Layout: One level Home Equipment: Other (comment) Additional Comments: unsure due to cognitive deficits    Prior Function Prior Level of Function : Pt does not need AD in home, no longer driving             Mobility Comments: Manages home with minimal assist from son ADLs Comments: Pt  stated that her son comes to town every 2 weeks to assist with ADLs     Extremity/Trunk Assessment   Upper Extremity Assessment Upper Extremity Assessment: Defer to OT evaluation    Lower Extremity Assessment Lower Extremity Assessment: Overall WFL for tasks assessed       Communication   Communication Communication: No apparent difficulties    Cognition Arousal: Alert Behavior During Therapy: WFL for tasks assessed/performed   PT - Cognitive impairments: History of cognitive impairments, Difficult to assess, Attention, Safety/Judgement Difficult to assess due to:  (history of cognitive impairments)                     PT - Cognition Comments: pt history of bipolar disorder, pt difficult to keep on track during conversation Following commands: Impaired Following commands impaired: Follows one step commands with increased time     Cueing Cueing Techniques: Verbal cues     General Comments  Greater than 10 minutes of static standing during functional activities with no LOB, able to demonstrate and teach PT on dance steps and perform confidently without LOB    Exercises     Assessment/Plan    PT Assessment Patient does not need any further PT services  PT Problem List         PT Treatment Interventions  PT Eval and treat    PT Goals (Current goals can be found in the Care Plan section)  Acute Rehab PT Goals Patient Stated Goal: to return home PT Goal Formulation: With patient Time For Goal Achievement: 11/11/23 Potential to Achieve Goals: Good    Frequency       Co-evaluation               AM-PAC PT 6 Clicks Mobility  Outcome Measure Help needed turning from your back to your side while in a flat bed without using bedrails?: None Help needed moving from lying on your back to sitting on the side of a flat bed without using bedrails?: None Help needed moving to and from a bed to a chair (including a wheelchair)?: None Help needed standing  up from a chair using your arms (e.g., wheelchair or bedside chair)?: None Help needed to walk in hospital room?: None Help needed climbing 3-5 steps with a railing? : None 6 Click Score: 24    End of Session Equipment Utilized During Treatment: Gait belt Activity Tolerance: Patient tolerated treatment well Patient left: in bed Nurse Communication: Mobility status PT Visit Diagnosis: Other symptoms and signs involving the nervous system (R29.898);History of falling (Z91.81)    Time: 7846-9629 PT Time Calculation (min) (ACUTE ONLY): 20 min   Charges:                 Darice Edelman, DPT 10/29/2023, 4:07 PM

## 2023-10-29 NOTE — Plan of Care (Signed)
  Problem: Education: Goal: Utilization of techniques to improve thought processes will improve Outcome: Progressing Goal: Knowledge of the prescribed therapeutic regimen will improve Outcome: Progressing   Problem: Activity: Goal: Interest or engagement in leisure activities will improve Outcome: Progressing Goal: Imbalance in normal sleep/wake cycle will improve Outcome: Progressing   Problem: Coping: Goal: Coping ability will improve Outcome: Progressing Goal: Will verbalize feelings Outcome: Progressing   Problem: Health Behavior/Discharge Planning: Goal: Ability to make decisions will improve Outcome: Progressing Goal: Compliance with therapeutic regimen will improve Outcome: Progressing   Problem: Role Relationship: Goal: Will demonstrate positive changes in social behaviors and relationships Outcome: Progressing   Problem: Safety: Goal: Ability to disclose and discuss suicidal ideas will improve Outcome: Progressing Goal: Ability to identify and utilize support systems that promote safety will improve Outcome: Progressing   Problem: Self-Concept: Goal: Will verbalize positive feelings about self Outcome: Progressing Goal: Level of anxiety will decrease Outcome: Progressing   Problem: Activity: Goal: Will verbalize the importance of balancing activity with adequate rest periods Outcome: Progressing   Problem: Education: Goal: Will be free of psychotic symptoms Outcome: Progressing Goal: Knowledge of the prescribed therapeutic regimen will improve Outcome: Progressing   Problem: Coping: Goal: Coping ability will improve Outcome: Progressing Goal: Will verbalize feelings Outcome: Progressing   Problem: Health Behavior/Discharge Planning: Goal: Compliance with prescribed medication regimen will improve Outcome: Progressing   Problem: Nutritional: Goal: Ability to achieve adequate nutritional intake will improve Outcome: Progressing   Problem: Role  Relationship: Goal: Ability to communicate needs accurately will improve Outcome: Progressing Goal: Ability to interact with others will improve Outcome: Progressing   Problem: Safety: Goal: Ability to redirect hostility and anger into socially appropriate behaviors will improve Outcome: Progressing Goal: Ability to remain free from injury will improve Outcome: Progressing   Problem: Self-Care: Goal: Ability to participate in self-care as condition permits will improve Outcome: Progressing   Problem: Self-Concept: Goal: Will verbalize positive feelings about self Outcome: Progressing
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
## 2023-10-30 DIAGNOSIS — F3111 Bipolar disorder, current episode manic without psychotic features, mild: Secondary | ICD-10-CM | POA: Diagnosis not present

## 2023-10-30 NOTE — Plan of Care (Signed)
  Problem: Education: Goal: Knowledge of the prescribed therapeutic regimen will improve Outcome: Progressing   Problem: Education: Goal: Utilization of techniques to improve thought processes will improve Outcome: Not Progressing

## 2023-10-30 NOTE — Group Note (Deleted)
 Physical/Occupational Therapy Group Note  Group Topic: Estate manager/land agent, Adaptive Equipment for ADLs  Group Date: 10/30/2023 Start Time: 1500 End Time: 1537 Facilitators: Orlie Bjornstad, OT   Group Description: Group educated on safety considerations/modifications with bathing, dressing and ADL routines to maximize independence and minimize fall risk with tasks.  Reviewed and demonstrated use of shower chair and tub transfer bench as appropriate.  Reviewed and demonstrated use of adaptive equipment (sock aide, reacher, long-handled sponge) available for ADL tasks.  Reviewed and demonstrated role of activity pacing and energy conservation with functional activities.  Provided handout with visual reference of available equipment.  Therapeutic Goal(s):  Verbalize and demonstrate safe technique for tub/shower transfers with DME/adaptive equipment as needed. Verbalize and demonstrate appropriate use of adaptive equipment with bathing, dressing and ADL routine. Verbalize and demonstrate appropriate use of activity pacing and energy conservation with bathing, dressing and ADL routine.  Individual Participation:  Participation Level: {OT BHH Participation WUJWJ:19147}   Participation Quality: {OT BHH Participation Quality:26268}   Behavior: {BHH OT Group Behavior:26269}   Speech/Thought Process: {BHH OT Speech/Thought Process:26270}   Affect/Mood: {OT BHH Affect/Mood:26271}   Insight: {OT BHH Insight:26272}   Judgement: {OT BHH Judgement:26272}   Modes of Intervention: {BHH MODES OF INTERVENTION:26273}  Patient Response to Interventions:  {BHH OT Patient Response to Interventions:26274}   Plan: Continue to engage patient in PT/OT groups 1 - 2x/week.  10/30/2023  Orlie Bjornstad, OT

## 2023-10-30 NOTE — Progress Notes (Signed)
 Pt is alert and oriented. Euphoric and talkative. Interacting with peers and staff. Reports seeing people dancing. Denies SI/HI/AH. C/o ingestion. Maalox 30 ml po given PRN at 2131 and 0155 for ingestion. Tol well. Q 15 min checks provided.    10/29/23 2100  Psych Admission Type (Psych Patients Only)  Admission Status Voluntary  Psychosocial Assessment  Patient Complaints Restlessness  Eye Contact Fair  Facial Expression Anxious  Affect Euphoric  Speech Pressured  Interaction Assertive  Motor Activity Slow  Appearance/Hygiene In scrubs  Behavior Characteristics Restless  Mood Euphoric  Thought Process  Coherency Tangential  Content Preoccupation;Religiosity  Delusions None reported or observed  Perception Hallucinations  Hallucination Visual  Judgment Impaired  Confusion None  Danger to Self  Current suicidal ideation? Denies

## 2023-10-30 NOTE — BH IP Treatment Plan (Signed)
 Interdisciplinary Treatment and Diagnostic Plan Update  10/30/2023 Time of Session: 11:14 AM  Stephanie Anthony MRN: [id number]  Principal Diagnosis: Bipolar disorder [facility])  Secondary Diagnoses: Principal Problem:   Bipolar disorder (HCC)   Current Medications:  Current Facility-Administered Medications  Medication Dose Route Frequency Provider Last Rate Last Admin   acetaminophen  (TYLENOL ) tablet 650 mg  650 mg Oral Q6H PRN Penn, Cicely, NP       alum & mag hydroxide-simeth (MAALOX/MYLANTA) 200-200-20 MG/5ML suspension 30 mL  30 mL Oral Q4H PRN Penn, Cicely, NP   30 mL at 10/30/23 0155   cephALEXin  (KEFLEX ) capsule 500 mg  500 mg Oral Q12H Penn, Cicely, NP   500 mg at 10/30/23 0940   lamoTRIgine  (LAMICTAL ) tablet 100 mg  100 mg Oral QHS Jadapalle, Sree, MD   100 mg at 10/29/23 2129   lamoTRIgine  (LAMICTAL ) tablet 25 mg  25 mg Oral Daily Jadapalle, Sree, MD   25 mg at 10/30/23 0940   latanoprost  (XALATAN ) 0.005 % ophthalmic solution 1 drop  1 drop Both Eyes QHS Jadapalle, Sree, MD   1 drop at 10/29/23 2131   levothyroxine  (SYNTHROID ) tablet 100 mcg  100 mcg Oral Q0600 Jadapalle, Sree, MD   100 mcg at 10/30/23 0558   losartan  (COZAAR ) tablet 25 mg  25 mg Oral Daily Jadapalle, Sree, MD   25 mg at 10/30/23 0940   OLANZapine  (ZYPREXA ) injection 5 mg  5 mg Intramuscular TID PRN Monroe Antigua, NP       OLANZapine  zydis (ZYPREXA ) disintegrating tablet 5 mg  5 mg Oral TID PRN Monroe Antigua, NP       pantoprazole  (PROTONIX ) EC tablet 40 mg  40 mg Oral Daily Jadapalle, Sree, MD   40 mg at 10/30/23 0940   pravastatin  (PRAVACHOL ) tablet 10 mg  10 mg Oral q1800 Jadapalle, Sree, MD   10 mg at 10/29/23 1829   QUEtiapine  (SEROQUEL ) tablet 25 mg  25 mg Oral Q breakfast Penn, Cicely, NP   25 mg at 10/30/23 0940   QUEtiapine  (SEROQUEL ) tablet 50 mg  50 mg Oral QHS Penn, Cicely, NP   50 mg at 10/29/23 2129   traZODone  (DESYREL ) tablet 50 mg  50 mg Oral QHS PRN Monroe Antigua, NP   50 mg at 10/30/23 0155   PTA  Medications: Medications Prior to Admission  Medication Sig Dispense Refill Last Dose/Taking   ARIPiprazole  (ABILIFY ) 2 MG tablet Take 1 tablet (2 mg total) by mouth at bedtime. 30 tablet 0    lamoTRIgine  (LAMICTAL ) 100 MG tablet Take 1 tablet (100 mg total) by mouth at bedtime. 90 tablet 0    latanoprost  (XALATAN ) 0.005 % ophthalmic solution Place 1 drop into both eyes at bedtime.      levothyroxine  (SYNTHROID ) 100 MCG tablet Take 100 mcg by mouth daily before breakfast.      losartan  (COZAAR ) 25 MG tablet Take 25 mg by mouth daily.      lovastatin (MEVACOR) 40 MG tablet Take 40 mg by mouth at bedtime.   6    pantoprazole  (PROTONIX ) 40 MG tablet Take 40 mg by mouth daily.        Patient Stressors:    Patient Strengths:    Treatment Modalities: Medication Management, Group therapy, Case management,  1 to 1 session with clinician, Psychoeducation, Recreational therapy.   Physician Treatment Plan for Primary Diagnosis: Bipolar disorder (HCC) Long Term Goal(s): Improvement in symptoms so as ready for discharge   Short Term Goals: Ability to identify changes  in lifestyle to reduce recurrence of condition will improve Ability to verbalize feelings will improve Ability to disclose and discuss suicidal ideas Ability to demonstrate self-control will improve Ability to identify and develop effective coping behaviors will improve Ability to maintain clinical measurements within normal limits will improve Compliance with prescribed medications will improve  Medication Management: Evaluate patient's response, side effects, and tolerance of medication regimen.  Therapeutic Interventions: 1 to 1 sessions, Unit Group sessions and Medication administration.  Evaluation of Outcomes: Progressing  Physician Treatment Plan for Secondary Diagnosis: Principal Problem:   Bipolar disorder (HCC)  Long Term Goal(s): Improvement in symptoms so as ready for discharge   Short Term Goals: Ability to  identify changes in lifestyle to reduce recurrence of condition will improve Ability to verbalize feelings will improve Ability to disclose and discuss suicidal ideas Ability to demonstrate self-control will improve Ability to identify and develop effective coping behaviors will improve Ability to maintain clinical measurements within normal limits will improve Compliance with prescribed medications will improve     Medication Management: Evaluate patient's response, side effects, and tolerance of medication regimen.  Therapeutic Interventions: 1 to 1 sessions, Unit Group sessions and Medication administration.  Evaluation of Outcomes: Progressing   RN Treatment Plan for Primary Diagnosis: Bipolar disorder (HCC) Long Term Goal(s): Knowledge of disease and therapeutic regimen to maintain health will improve  Short Term Goals: Ability to remain free from injury will improve, Ability to verbalize frustration and anger appropriately will improve, Ability to demonstrate self-control, Ability to participate in decision making will improve, Ability to verbalize feelings will improve, Ability to disclose and discuss suicidal ideas, Ability to identify and develop effective coping behaviors will improve, and Compliance with prescribed medications will improve  Medication Management: RN will administer medications as ordered by provider, will assess and evaluate patient's response and provide education to patient for prescribed medication. RN will report any adverse and/or side effects to prescribing provider.  Therapeutic Interventions: 1 on 1 counseling sessions, Psychoeducation, Medication administration, Evaluate responses to treatment, Monitor vital signs and CBGs as ordered, Perform/monitor CIWA, COWS, AIMS and Fall Risk screenings as ordered, Perform wound care treatments as ordered.  Evaluation of Outcomes: Progressing   LCSW Treatment Plan for Primary Diagnosis: Bipolar disorder (HCC) Long  Term Goal(s): Safe transition to appropriate next level of care at discharge, Engage patient in therapeutic group addressing interpersonal concerns.  Short Term Goals: Engage patient in aftercare planning with referrals and resources, Increase social support, Increase ability to appropriately verbalize feelings, Increase emotional regulation, Facilitate acceptance of mental health diagnosis and concerns, Facilitate patient progression through stages of change regarding substance use diagnoses and concerns, Identify triggers associated with mental health/substance abuse issues, and Increase skills for wellness and recovery  Therapeutic Interventions: Assess for all discharge needs, 1 to 1 time with Social worker, Explore available resources and support systems, Assess for adequacy in community support network, Educate family and significant other(s) on suicide prevention, Complete Psychosocial Assessment, Interpersonal group therapy.  Evaluation of Outcomes: Progressing   Progress in Treatment: Attending groups: Yes. Participating in groups: Yes. Taking medication as prescribed: Yes. Toleration medication: Yes. Family/Significant other contact made: No, will contact:  CSW will contact if given permission  Patient understands diagnosis: Yes. Discussing patient identified problems/goals with staff: Yes. Medical problems stabilized or resolved: Yes. Denies suicidal/homicidal ideation: Yes. Issues/concerns per patient self-inventory: No. Other: None   New problem(s) identified: No, Describe:  none identified   New Short Term/Long Term Goal(s): elimination of symptoms  of psychosis, medication management for mood stabilization; elimination of SI thoughts; development of comprehensive mental wellness plan.   Patient Goals:  to get me back from seeing all these images and get this urinary tract infection cleared up  Discharge Plan or Barriers: CSW to assist with appropriate discharge planning    Reason for Continuation of Hospitalization: Mania Medication stabilization  Estimated Length of Stay: 1 to 7 days   Last 3 [place] Suicide Severity Risk Score: Flowsheet Row Admission (Current) from 10/28/2023 in [facility] from 10/27/2023 in [facility] Emergency Department at [facility] ED from 06/29/2023 in [facility] Department at [facility]  C-SSRS RISK CATEGORY No Risk No Risk No Risk    Last PHQ 2/9 Scores:    06/01/2023   11:47 AM 09/29/2022   12:02 PM 07/10/2022   12:05 PM  Depression screen PHQ 2/9  Decreased Interest 1 1 2   Down, Depressed, Hopeless 0 1 1  PHQ - 2 Score 1 2 3   Altered sleeping  1 1  Tired, decreased energy  2 3  Change in appetite  0 0  Feeling bad or failure about yourself   1 2  Trouble concentrating  0 0  Moving slowly or fidgety/restless  0 0  Suicidal thoughts  0 0  PHQ-9 Score  6 9  Difficult doing work/chores  Somewhat difficult Somewhat difficult    Scribe for Treatment Team: Claudio Culver, Milinda Allen 10/30/2023 1:44 PM

## 2023-10-30 NOTE — Progress Notes (Signed)
 Occupational Therapy Treatment Patient Details Name: Stephanie Anthony MRN: [id number] DOB: [birth year] Today's Date: 10/30/2023   History of present illness Pt is a 89 y.o. female who presented to the ED for hallucinations. Pt has a PMH bipolar disorder, CKD, hyperlipidemia. MD diagnoses includes hallucinations, acute cystitis without hematuria, and bipolar affective disorder.   OT comments  Chart reviewed to date, pt greeted in day room, agreeable to tx session targeting further assessment of IADL status including med management. Pt is unable to read bottle labels, when read to hear, she does not place simulated beads in correct places either attempts. She reports that she writes on a piece of paper after she takes her meds at home. Would recommend supervision for med management after dc at this time, OT will continue to assess/address ADL/IADL status while admitted.      If plan is discharge home, recommend the following:  A little help with bathing/dressing/bathroom;Assistance with cooking/housework;Direct supervision/assist for medications management;Direct supervision/assist for financial management   Equipment Recommendations  None recommended by OT    Recommendations for Other Services      Precautions / Restrictions Precautions Precautions: Fall Recall of Precautions/Restrictions: Impaired Restrictions Weight Bearing Restrictions Per Provider Order: No       Mobility Bed Mobility                    Transfers Overall transfer level: Independent Equipment used: None Transfers: Sit to/from Stand Sit to Stand: Independent                 Balance                                           ADL either performed or assessed with clinical judgement   ADL Overall ADL's : Needs assistance/impaired Eating/Feeding: Set up   Grooming: Set up                                      Extremity/Trunk Assessment               Vision       Perception     Praxis     Communication Communication Communication: No apparent difficulties   Cognition Arousal: Alert Behavior During Therapy: WFL for tasks assessed/performed Cognition: Cognition impaired         Attention impairment (select first level of impairment): Sustained attention Executive functioning impairment (select all impairments): Problem solving, Reasoning OT - Cognition Comments: pt participated in pill box test- she is unable to read labels and when they are read to her she does not place pills in correct places on 2 attempts                 Following commands: Impaired Following commands impaired: Follows one step commands with increased time      Cueing   Cueing Techniques: Verbal cues  Exercises Other Exercises Other Exercises: edu re: role of OT, alternate ways to manage meds- pt reprots she writes everything down that she takes, does not prefer bubble packs or pill box    Shoulder Instructions       General Comments vss throughout    Pertinent Vitals/ Pain       Pain Assessment Pain Assessment: Faces Faces Pain Scale: No hurt  Home  Living                                          Prior Functioning/Environment              Frequency  Min 1X/week        Progress Toward Goals  OT Goals(current goals can now be found in the care plan section)  Progress towards OT goals: Progressing toward goals  Acute Rehab OT Goals Time For Goal Achievement: 11/12/23  Plan      Co-evaluation                 AM-PAC OT 6 Clicks Daily Activity     Outcome Measure   Help from another person eating meals?: None Help from another person taking care of personal grooming?: None Help from another person toileting, which includes using toliet, bedpan, or urinal?: None Help from another person bathing (including washing, rinsing, drying)?: A Little Help from another person to put on and taking  off regular upper body clothing?: None Help from another person to put on and taking off regular lower body clothing?: A Little 6 Click Score: 22    End of Session    OT Visit Diagnosis: Other abnormalities of gait and mobility (R26.89);Cognitive communication deficit (R41.841)   Activity Tolerance Patient tolerated treatment well   Patient Left Other (comment) (in chair in day room)   Nurse Communication Mobility status        Time: 2841-3244 OT Time Calculation (min): 9 min  Charges: OT General Charges $OT Visit: 1 Visit OT Treatments $Cognitive Funtion inital: Initial 15 mins  Gerre Kraft, OTD OTR/L  10/30/23, 2:51 PM

## 2023-10-30 NOTE — Group Note (Signed)
 Date:  10/30/2023 Time:  11:33 AM  Group Topic/Focus:  Building Self Esteem:   The Focus of this group is helping patients become aware of the effects of self-esteem on their lives, the things they and others do that enhance or undermine their self-esteem, seeing the relationship between their level of self-esteem and the choices they make and learning ways to enhance self-esteem.    Participation Level:  Did Not Attend         Stephanie Anthony 10/30/2023, 11:33 AM

## 2023-10-30 NOTE — Group Note (Signed)
 Physical/Occupational Therapy Group Note  Group Topic: Estate manager/land agent, Adaptive Equipment for ADLs  Group Date: 10/30/2023 Start Time: 1300 End Time: 1337 Facilitators: Orlie Bjornstad, OT   Group Description: Group educated on safety considerations/modifications with bathing, dressing and ADL routines to maximize independence and minimize fall risk with tasks.  Reviewed and demonstrated use of shower chair and tub transfer bench as appropriate.  Reviewed and demonstrated use of adaptive equipment (sock aide, reacher, long-handled sponge) available for ADL tasks.  Reviewed and demonstrated role of activity pacing and energy conservation with functional activities.  Provided handout with visual reference of available equipment.  Therapeutic Goal(s):  Verbalize and demonstrate safe technique for tub/shower transfers with DME/adaptive equipment as needed. Verbalize and demonstrate appropriate use of adaptive equipment with bathing, dressing and ADL routine. Verbalize and demonstrate appropriate use of activity pacing and energy conservation with bathing, dressing and ADL routine.  Individual Participation: Pt engaged with group and provided techniques she utilizes for safer ADL completion   Participation Level: Hyperverbal   Participation Quality: Minimal Cues   Behavior: Attentive  and Hyperverbal   Speech/Thought Process: Directed   Affect/Mood: Euthymic   Insight: Moderate   Judgement: Moderate   Modes of Intervention: Activity, Discussion, Education, and Problem-solving  Patient Response to Interventions:  Attentive, Engaged, and Receptive   Plan: Continue to engage patient in PT/OT groups 1 - 2x/week.  10/30/2023  Orlie Bjornstad, OT Gerre Kraft, OTD OTR/L  10/30/23, 2:58 PM

## 2023-10-30 NOTE — Plan of Care (Signed)

## 2023-10-30 NOTE — BH Assessment (Signed)
 Recreation Therapy Notes  INPATIENT RECREATION THERAPY ASSESSMENT  Patient Details Name: Stephanie Anthony MRN: [id number] DOB: January 02, 1934 Today's Date: 10/30/2023       Information Obtained From: Patient  Able to Participate in Assessment/Interview: Yes  Patient Presentation: Responsive, Alert, Tangential  Reason for Admission (Per Patient): Active Symptoms  Coping Skills:   Dance  Leisure Interests (2+):  Games - Therapist, music, Music - Listen  Frequency of Recreation/Participation: Weekly  Awareness of Community Resources:  Yes  Community Resources:  [employer]  Current Use: Yes   Expressed Interest in [employer] Information: No   Patient Main Form of Transportation:  (I have a son and friends that love me very much and drive me when I need it.)  Patient Strengths:  I am at 50%, my back hurts from the bone spurs  Patient Identified Areas of Improvement:  I have improved enough...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

## 2023-10-30 NOTE — Progress Notes (Signed)
   10/30/23 1100  Psych Admission Type (Psych Patients Only)  Admission Status Voluntary  Psychosocial Assessment  Patient Complaints None  Eye Contact Fair  Facial Expression Animated  Affect Appropriate to circumstance  Speech Logical/coherent  Interaction Assertive  Motor Activity Slow  Appearance/Hygiene In scrubs  Behavior Characteristics Restless  Mood Anxious  Thought Process  Coherency Flight of ideas  Content Religiosity  Delusions None reported or observed  Perception Hallucinations  Hallucination Visual  Judgment Impaired  Confusion None  Danger to Self  Current suicidal ideation? Denies  Danger to Others  Danger to Others None reported or observed

## 2023-10-30 NOTE — Group Note (Signed)
 Recreation Therapy Group Note   Group Topic:General Recreation  Group Date: 10/30/2023 Start Time: 1430 End Time: 1530 Facilitators: Deatrice Factor, LRT, CTRS Location: Dayroom  Group Description: Positive Affirmation Bingo. LRT and patients played multiple games of Bingo with music playing in the background. LRT and pts discussed what a positive affirmation is, the importance of speaking kindly to yourself, and the use of this as a coping skill.   Goal Area(s) Addressed: Patient will learn positive affirmations.  Patient will engage in recreation activity.  Patient will increase communication.    Affect/Mood: Appropriate   Participation Level: Active and Engaged   Participation Quality: Independent   Behavior: Alert, Appropriate, and Cooperative   Speech/Thought Process: Coherent   Insight: Fair   Judgement: Fair    Modes of Intervention: Competitive Play   Patient Response to Interventions:  Attentive, Engaged, Interested , and Receptive   Education Outcome:  Acknowledges education   Clinical Observations/Individualized Feedback: Dellia was active in their participation of session activities and group discussion. Pt won a Facilities manager and was offered a stress ball as a Scientist, research (physical sciences). Pt interacted well with LRT and peers duration of session.    Plan: Continue to engage patient in RT group sessions 2-3x/week.   Deatrice Factor, LRT, CTRS 10/30/2023 4:48 PM

## 2023-10-30 NOTE — Progress Notes (Signed)
 [facility] MD Progress Note  10/30/2023 10:42 PM Stephanie Anthony  MRN:  [id number]  Patient is a 89 y/o WF presents to ED with worsening hallucinations, mania.  She reports her psychiatrist told her to stop taking her aripiprazole  recently due to kidney function concerns.  However, review of medical records indicates this medication was  discontinued 09/29/22 due to TD and Parkinson symptoms.  The initial plan documented was to increase lamotrigine  if needed; however, appears she has been fairly stable on lamotrigine  current dose since discontinue of aripiprazole  until recently; she is noted to have a UTI which most likely has contributed to recent mania episode. Patient is admitted to [facility] unit with Q15 min safety monitoring. Multidisciplinary team approach is offered. Medication management; group/milieu therapy is offered.   Subjective:  Chart reviewed, case discussed in multidisciplinary meeting, patient seen during rounds.  Patient is noted to be engaging very well with her peers on the unit and singing chorus.  She reports that the new medication Seroquel  is helping her to sleep better.  She is noted to be more redirectable today less hyperverbal but still with poor boundaries, disinhibited behavior.  She is not hypersexual but makes flirting comments with the female staff.  She wanted to have the provider and provider educated her on personal boundaries and she was able to back off and give the provider if his pump.  She denies SI/HI/plan and denies hallucinations.  She reports that a picture on the wall yesterday mood a little bit but other than that she denies having any active visual hallucinations today.  Provider discussed the plan of adding a mood stabilizer or optimizing Seroquel  if she continues to have mood lability or racing thoughts.   Sleep: Fair  Appetite:  Fair  Past Psychiatric History: see h&P Family History:  Family History  Problem Relation Age of Onset   Heart Problems Mother     Seizures Mother    Stroke Mother    Diverticulitis Mother    Alcohol abuse Father    Heart attack Father    Heart attack Sister    Pancreatitis Sister    Cancer Sister    Cancer Brother    Social History:  Social History   Substance and Sexual Activity  Alcohol Use No   Alcohol/week: 0.0 standard drinks of alcohol     Social History   Substance and Sexual Activity  Drug Use No    Social History   Socioeconomic History   Marital status: Widowed    Spouse name: Not on file   Number of children: 2   Years of education: Not on file   Highest education level: 6th grade  Occupational History   Not on file  Tobacco Use   Smoking status: Former   Smokeless tobacco: Never   Tobacco comments:    quit 1964  Vaping Use   Vaping status: Never Used  Substance and Sexual Activity   Alcohol use: No    Alcohol/week: 0.0 standard drinks of alcohol   Drug use: No   Sexual activity: Not Currently  Other Topics Concern   Not on file  Social History Narrative   Lives at home alone. Son lives in [place].   Social Drivers of Corporate investment banker Strain: Low Risk  (08/05/2022)   Received from [facility]   Overall Financial Resource Strain (CARDIA)    Difficulty of Paying Living Expenses: Not hard at all  Food Insecurity: No Food Insecurity (  10/28/2023)   Hunger Vital Sign    Worried About Running Out of Food in the Last Year: Never true    Ran Out of Food in the Last Year: Never true  Transportation Needs: No Transportation Needs (10/28/2023)   PRAPARE - Administrator, Civil Service (Medical): No    Lack of Transportation (Non-Medical): No  Physical Activity: Not on file  Stress: Not on file  Social Connections: Socially Isolated (10/28/2023)   Social Connection and Isolation Panel    Frequency of Communication with Friends and Family: More than three times a week    Frequency of Social Gatherings with Friends and Family: More than  three times a week    Attends Religious Services: Never    Database administrator or Organizations: No    Attends Banker Meetings: Never    Marital Status: Widowed   Past Medical History:  Past Medical History:  Diagnosis Date   Arthritis    Bipolar 1 disorder (HCC)    Cataracts, bilateral    Chronic kidney disease    stage 3   Chronic kidney disease    Chronic mitral valve disease    Coronary artery disease    Deviated nasal septum    Erosive esophagitis    Erosive esophagitis    Esophageal stricture    Fibroid tumor    GERD (gastroesophageal reflux disease)    History of hiatal hernia    Hypertension    Hypothyroidism    Murmur, cardiac    Nephrogenic diabetes insipidus (HCC)    Pure hypercholesterolemia    Renal cyst, left    bilateral   Seasonal allergies     Past Surgical History:  Procedure Laterality Date   CARDIAC CATHETERIZATION     CHOLECYSTECTOMY     COLONOSCOPY WITH PROPOFOL  N/A 06/01/2018   Procedure: COLONOSCOPY WITH PROPOFOL ;  Surgeon: Deveron Fly, MD;  Location: [facility] ENDOSCOPY;  Service: Endoscopy;  Laterality: N/A;   ESOPHAGEAL DILATION     ESOPHAGOGASTRODUODENOSCOPY (EGD) WITH PROPOFOL  N/A 10/20/2014   Procedure: ESOPHAGOGASTRODUODENOSCOPY (EGD) WITH PROPOFOL ;  Surgeon: Deveron Fly, MD;  Location: [facility] ENDOSCOPY;  Service: Endoscopy;  Laterality: N/A;   ESOPHAGOGASTRODUODENOSCOPY (EGD) WITH PROPOFOL  N/A 06/01/2018   Procedure: ESOPHAGOGASTRODUODENOSCOPY (EGD) WITH PROPOFOL ;  Surgeon: Deveron Fly, MD;  Location: [facility] ENDOSCOPY;  Service: Endoscopy;  Laterality: N/A;   INFECTED SKIN DEBRIDEMENT     on back several years ago   JOINT REPLACEMENT     KNEE ARTHROPLASTY Left 06/10/2023   Procedure: COMPUTER ASSISTED TOTAL KNEE ARTHROPLASTY;  Surgeon: Arlyne Lame, MD;  Location: [facility] ORS;  Service: Orthopedics;  Laterality: Left;   knee replacement, right     LAPAROSCOPIC APPENDECTOMY N/A 06/21/2018   Procedure: APPENDECTOMY  LAPAROSCOPIC;  Surgeon: Eldred Grego, MD;  Location: [facility] ORS;  Service: General;  Laterality: N/A;   NOSE SURGERY     THYROIDECTOMY     TOTAL THYROIDECTOMY      Current Medications: Current Facility-Administered Medications  Medication Dose Route Frequency Provider Last Rate Last Admin   acetaminophen  (TYLENOL ) tablet 650 mg  650 mg Oral Q6H PRN Penn, Cicely, NP       alum & mag hydroxide-simeth (MAALOX/MYLANTA) 200-200-20 MG/5ML suspension 30 mL  30 mL Oral Q4H PRN Penn, Cicely, NP   30 mL at 10/30/23 0155   cephALEXin  (KEFLEX ) capsule 500 mg  500 mg Oral Q12H Penn, Cicely, NP   500 mg at 10/30/23 2118   lamoTRIgine  (LAMICTAL ) tablet  100 mg  100 mg Oral QHS Braxson Hollingsworth, MD   100 mg at 10/30/23 2118   lamoTRIgine  (LAMICTAL ) tablet 25 mg  25 mg Oral Daily Harlem Bula, MD   25 mg at 10/30/23 0940   latanoprost  (XALATAN ) 0.005 % ophthalmic solution 1 drop  1 drop Both Eyes QHS Lindzey Zent, MD   1 drop at 10/30/23 2117   levothyroxine  (SYNTHROID ) tablet 100 mcg  100 mcg Oral Q0600 Tijuana Scheidegger, MD   100 mcg at 10/30/23 0558   losartan  (COZAAR ) tablet 25 mg  25 mg Oral Daily Hilda Wexler, MD   25 mg at 10/30/23 0940   OLANZapine  (ZYPREXA ) injection 5 mg  5 mg Intramuscular TID PRN Monroe Antigua, NP       OLANZapine  zydis (ZYPREXA ) disintegrating tablet 5 mg  5 mg Oral TID PRN Monroe Antigua, NP       pantoprazole  (PROTONIX ) EC tablet 40 mg  40 mg Oral Daily Katerra Ingman, MD   40 mg at 10/30/23 0940   pravastatin  (PRAVACHOL ) tablet 10 mg  10 mg Oral q1800 Olivianna Higley, MD   10 mg at 10/30/23 1707   QUEtiapine  (SEROQUEL ) tablet 25 mg  25 mg Oral Q breakfast Penn, Alaine Howells, NP   25 mg at 10/30/23 0940   QUEtiapine  (SEROQUEL ) tablet 50 mg  50 mg Oral QHS Penn, Cicely, NP   50 mg at 10/30/23 2117   traZODone  (DESYREL ) tablet 50 mg  50 mg Oral QHS PRN Monroe Antigua, NP   50 mg at 10/30/23 2117    Lab Results: No results found for this or any previous visit (from the past 48  hours).  Blood Alcohol level:  Lab Results  Component Value Date   [facility] <15 10/27/2023   ETH <5 07/07/2015    Metabolic Disorder Labs: No results found for: HGBA1C, MPG No results found for: PROLACTIN No results found for: CHOL, TRIG, HDL, CHOLHDL, VLDL, LDLCALC  Physical Findings: AIMS:  , ,  ,  ,    CIWA:    COWS:      Psychiatric Specialty Exam:  Presentation  General Appearance:  Appropriate for Environment; Casual  Eye Contact: Fair  Speech: Pressured  Speech Volume: Increased    Mood and Affect  Mood: Euphoric  Affect: Appropriate   Thought Process  Thought Processes: Irrevelant; Goal Directed  Descriptions of Associations:Intact  Orientation:Partial  Thought Content:Illogical  Hallucinations:Hallucinations: Visual  Ideas of Reference:None  Suicidal Thoughts:Suicidal Thoughts: No  Homicidal Thoughts:Homicidal Thoughts: No   Sensorium  Memory: Immediate Fair; Recent Fair; Remote Poor  Judgment: Impaired  Insight: Shallow   Executive Functions  Concentration: Poor  Attention Span: Poor  Recall: [employer] of Knowledge: Fair  Language: Fair   Psychomotor Activity  Psychomotor Activity: Psychomotor Activity: Normal  Musculoskeletal: Strength & Muscle Tone: within normal limits Gait & Station: unsteady Assets  Assets: Manufacturing systems engineer; Desire for Improvement; Resilience; Social Support    Physical Exam: Physical Exam Vitals and nursing note reviewed.  HENT:     Head: Normocephalic.   Neurological:     Mental Status: She is alert.    Review of Systems  Constitutional: Negative.   HENT: Negative.    Eyes: Negative.   Skin: Negative.    Blood pressure 105/81, pulse 74, temperature (!) 97.3 F (36.3 C), resp. rate (!) 22, height 5' 7 (1.702 m), weight 80.3 kg, SpO2 100%. Body mass index is 27.72 kg/m.  Diagnosis: Principal Problem:   Bipolar disorder  (HCC)  PLAN: Safety and Monitoring:  -- Voluntary admission to inpatient psychiatric unit for safety, stabilization and treatment  -- Daily contact with patient to assess and evaluate symptoms and progress in treatment  -- Patient's case to be discussed in multi-disciplinary team meeting  -- Observation Level : q15 minute checks  -- Vital signs:  q12 hours  -- Precautions: suicide, elopement, and assault -- Encouraged patient to participate in unit milieu and in scheduled group therapies  2. Psychiatric Diagnoses and Treatment:    Seroquel  25 mg QAM and 50 mg at bedtime Increased lamictal  to 25 mg QAM and 100mg  QHS     3. Medical Issues Being Addressed:   No urgent medical need identified  4. Discharge Planning:   -- Social work and case management to assist with discharge planning and identification of hospital follow-up needs prior to discharge  -- Estimated LOS: 3-4 days  Lloyde Ludlam, MD 10/30/2023, 10:42 PM

## 2023-10-31 DIAGNOSIS — F3112 Bipolar disorder, current episode manic without psychotic features, moderate: Secondary | ICD-10-CM

## 2023-10-31 LAB — HEMOGLOBIN A1C
Hgb A1c MFr Bld: 5.8 % — ABNORMAL HIGH (ref 4.8–5.6)
Mean Plasma Glucose: 119.76 mg/dL

## 2023-10-31 MED ORDER — QUETIAPINE FUMARATE 100 MG PO TABS
100.0000 mg | ORAL_TABLET | Freq: Every day | ORAL | Status: DC
  Administered 2023-10-31 – 2023-11-04 (×5): 100 mg via ORAL
  Filled 2023-10-31 (×5): qty 1

## 2023-10-31 NOTE — Group Note (Signed)
 Date:  10/31/2023 Time:  10:19 PM  Group Topic/Focus:  Managing Feelings:   The focus of this group is to identify what feelings patients have difficulty handling and develop a plan to handle them in a healthier way upon discharge.    Participation Level:  Active  Participation Quality:  Appropriate  Affect:  Appropriate  Cognitive:  Appropriate  Insight: Good  Engagement in Group:  Engaged  Modes of Intervention:  Discussion  Additional Comments:    Lynette Saras 10/31/2023, 10:19 PM

## 2023-10-31 NOTE — Plan of Care (Signed)

## 2023-10-31 NOTE — Group Note (Signed)
 Date:  10/31/2023 Time:  10:57 AM  Group Topic/Focus:  Self Care:   The focus of this group is to help patients understand the importance of self-care in order to improve or restore emotional, physical, spiritual, interpersonal, and financial health.    Participation Level:  Active  Participation Quality:  Appropriate  Affect:  Appropriate  Cognitive:  Appropriate  Insight: Appropriate  Engagement in Group:  Engaged  Modes of Intervention:  Activity and Education  Additional Comments:  none  Merton Abts 10/31/2023, 10:57 AM

## 2023-10-31 NOTE — Progress Notes (Signed)
   10/30/23 2100  Psych Admission Type (Psych Patients Only)  Admission Status Voluntary  Psychosocial Assessment  Patient Complaints None  Eye Contact Fair  Facial Expression Animated  Affect Appropriate to circumstance  Speech Logical/coherent  Interaction Assertive  Motor Activity Slow  Appearance/Hygiene In scrubs  Behavior Characteristics Cooperative  Mood Anxious  Thought Process  Coherency WDL  Content WDL  Delusions Religious  Perception Hallucinations  Hallucination Visual  Judgment Impaired  Confusion Mild  Danger to Self  Current suicidal ideation? Denies

## 2023-10-31 NOTE — Plan of Care (Signed)
  Problem: Health Behavior/Discharge Planning: Goal: Ability to make decisions will improve Outcome: Progressing Goal: Compliance with therapeutic regimen will improve Outcome: Progressing

## 2023-10-31 NOTE — Progress Notes (Signed)
 [facility] MD Progress Note  10/31/2023 11:58 AM Stephanie Anthony  MRN:  [id number]  Patient is a 89 y/o WF presents to ED with worsening hallucinations, mania.  She reports her psychiatrist told her to stop taking her aripiprazole  recently due to kidney function concerns.  However, review of medical records indicates this medication was  discontinued 09/29/22 due to TD and Parkinson symptoms.  The initial plan documented was to increase lamotrigine  if needed; however, appears she has been fairly stable on lamotrigine  current dose since discontinue of aripiprazole  until recently; she is noted to have a UTI which most likely has contributed to recent mania episode. Patient is admitted to [facility] unit with Q15 min safety monitoring. Multidisciplinary team approach is offered. Medication management; group/milieu therapy is offered.   Subjective:  Nursing report patient continues to endorse having visual hallucinations at times.  Still reportedly has manic and hypersexual behaviors though markedly decreased compared to admission.  Patient had been calm cooperative.  Not noted to be responding to internal stimuli.  Patient on interview was able to engage with this provider.  Reports that she had a history of bipolar disorder and was recently taken off of Abilify .  Chart review patient was seen by Dr. Hisada the plan was to start patient on Abilify .  Patient on interview at this time still remain hyperverbal, flirtatious, but mostly redirectable.  She reports that she was brought into the hospital as she was hallucinating.  Reports that she was seeing monsters, Devil, body of animals in her house.  Reports that things that make her scared is that she thought she was seeing snakes instead of refrigerator handle.  Patient reports that she is doing better as she is not seeing those dangerous things.  Though she reports that she is still at times see things.  She reports that she does not hear things.  Denies any current  burning micturition or any UTI symptoms.  Or any side effects of the medication.  Patient reports that she is not sleeping well.  We will increase the patient nighttime Seroquel  200 mg and continue morning 25 mg Seroquel  along with Lamictal .   Sleep: Fair  Appetite:  Fair  Past Psychiatric History: see h&P Family History:  Family History  Problem Relation Age of Onset   Heart Problems Mother    Seizures Mother    Stroke Mother    Diverticulitis Mother    Alcohol abuse Father    Heart attack Father    Heart attack Sister    Pancreatitis Sister    Cancer Sister    Cancer Brother    Social History:  Social History   Substance and Sexual Activity  Alcohol Use No   Alcohol/week: 0.0 standard drinks of alcohol     Social History   Substance and Sexual Activity  Drug Use No    Social History   Socioeconomic History   Marital status: Widowed    Spouse name: Not on file   Number of children: 2   Years of education: Not on file   Highest education level: 6th grade  Occupational History   Not on file  Tobacco Use   Smoking status: Former   Smokeless tobacco: Never   Tobacco comments:    quit 1964  Vaping Use   Vaping status: Never Used  Substance and Sexual Activity   Alcohol use: No    Alcohol/week: 0.0 standard drinks of alcohol   Drug use: No   Sexual activity: Not Currently  Other Topics Concern   Not on file  Social History Narrative   Lives at home alone. Son lives in [place].   Social Drivers of Corporate investment banker Strain: Low Risk  (08/05/2022)   Received from [facility]   Overall Financial Resource Strain (CARDIA)    Difficulty of Paying Living Expenses: Not hard at all  Food Insecurity: No Food Insecurity (10/28/2023)   Hunger Vital Sign    Worried About Running Out of Food in the Last Year: Never true    Ran Out of Food in the Last Year: Never true  Transportation Needs: No Transportation Needs (10/28/2023)   PRAPARE  - Administrator, Civil Service (Medical): No    Lack of Transportation (Non-Medical): No  Physical Activity: Not on file  Stress: Not on file  Social Connections: Socially Isolated (10/28/2023)   Social Connection and Isolation Panel    Frequency of Communication with Friends and Family: More than three times a week    Frequency of Social Gatherings with Friends and Family: More than three times a week    Attends Religious Services: Never    Database administrator or Organizations: No    Attends Banker Meetings: Never    Marital Status: Widowed   Past Medical History:  Past Medical History:  Diagnosis Date   Arthritis    Bipolar 1 disorder (HCC)    Cataracts, bilateral    Chronic kidney disease    stage 3   Chronic kidney disease    Chronic mitral valve disease    Coronary artery disease    Deviated nasal septum    Erosive esophagitis    Erosive esophagitis    Esophageal stricture    Fibroid tumor    GERD (gastroesophageal reflux disease)    History of hiatal hernia    Hypertension    Hypothyroidism    Murmur, cardiac    Nephrogenic diabetes insipidus (HCC)    Pure hypercholesterolemia    Renal cyst, left    bilateral   Seasonal allergies     Past Surgical History:  Procedure Laterality Date   CARDIAC CATHETERIZATION     CHOLECYSTECTOMY     COLONOSCOPY WITH PROPOFOL  N/A 06/01/2018   Procedure: COLONOSCOPY WITH PROPOFOL ;  Surgeon: Deveron Fly, MD;  Location: [facility] ENDOSCOPY;  Service: Endoscopy;  Laterality: N/A;   ESOPHAGEAL DILATION     ESOPHAGOGASTRODUODENOSCOPY (EGD) WITH PROPOFOL  N/A 10/20/2014   Procedure: ESOPHAGOGASTRODUODENOSCOPY (EGD) WITH PROPOFOL ;  Surgeon: Deveron Fly, MD;  Location: [facility] ENDOSCOPY;  Service: Endoscopy;  Laterality: N/A;   ESOPHAGOGASTRODUODENOSCOPY (EGD) WITH PROPOFOL  N/A 06/01/2018   Procedure: ESOPHAGOGASTRODUODENOSCOPY (EGD) WITH PROPOFOL ;  Surgeon: Deveron Fly, MD;  Location: [facility] ENDOSCOPY;   Service: Endoscopy;  Laterality: N/A;   INFECTED SKIN DEBRIDEMENT     on back several years ago   JOINT REPLACEMENT     KNEE ARTHROPLASTY Left 06/10/2023   Procedure: COMPUTER ASSISTED TOTAL KNEE ARTHROPLASTY;  Surgeon: Arlyne Lame, MD;  Location: [facility] ORS;  Service: Orthopedics;  Laterality: Left;   knee replacement, right     LAPAROSCOPIC APPENDECTOMY N/A 06/21/2018   Procedure: APPENDECTOMY LAPAROSCOPIC;  Surgeon: Eldred Grego, MD;  Location: [facility] ORS;  Service: General;  Laterality: N/A;   NOSE SURGERY     THYROIDECTOMY     TOTAL THYROIDECTOMY      Current Medications: Current Facility-Administered Medications  Medication Dose Route Frequency Provider Last Rate Last Admin   acetaminophen  (TYLENOL )  tablet 650 mg  650 mg Oral Q6H PRN Penn, Cicely, NP       alum & mag hydroxide-simeth (MAALOX/MYLANTA) 200-200-20 MG/5ML suspension 30 mL  30 mL Oral Q4H PRN Penn, Cicely, NP   30 mL at 10/30/23 0155   cephALEXin  (KEFLEX ) capsule 500 mg  500 mg Oral Q12H Penn, Cicely, NP   500 mg at 10/31/23 0865   lamoTRIgine  (LAMICTAL ) tablet 100 mg  100 mg Oral QHS Jadapalle, Sree, MD   100 mg at 10/30/23 2118   lamoTRIgine  (LAMICTAL ) tablet 25 mg  25 mg Oral Daily Jadapalle, Sree, MD   25 mg at 10/31/23 7846   latanoprost  (XALATAN ) 0.005 % ophthalmic solution 1 drop  1 drop Both Eyes QHS Jadapalle, Sree, MD   1 drop at 10/30/23 2117   levothyroxine  (SYNTHROID ) tablet 100 mcg  100 mcg Oral Q0600 Jadapalle, Sree, MD   100 mcg at 10/31/23 9629   losartan  (COZAAR ) tablet 25 mg  25 mg Oral Daily Jadapalle, Sree, MD   25 mg at 10/31/23 5284   OLANZapine  (ZYPREXA ) injection 5 mg  5 mg Intramuscular TID PRN Monroe Antigua, NP       OLANZapine  zydis (ZYPREXA ) disintegrating tablet 5 mg  5 mg Oral TID PRN Monroe Antigua, NP       pantoprazole  (PROTONIX ) EC tablet 40 mg  40 mg Oral Daily Jadapalle, Sree, MD   40 mg at 10/31/23 1324   pravastatin  (PRAVACHOL ) tablet 10 mg  10 mg Oral q1800 Jadapalle, Sree, MD    10 mg at 10/30/23 1707   QUEtiapine  (SEROQUEL ) tablet 100 mg  100 mg Oral QHS Asante Blanda, MD       QUEtiapine  (SEROQUEL ) tablet 25 mg  25 mg Oral Q breakfast Penn, Cicely, NP   25 mg at 10/31/23 4010   traZODone  (DESYREL ) tablet 50 mg  50 mg Oral QHS PRN Monroe Antigua, NP   50 mg at 10/30/23 2117    Lab Results: No results found for this or any previous visit (from the past 48 hours).  Blood Alcohol level:  Lab Results  Component Value Date   [facility] <15 10/27/2023   ETH <5 07/07/2015    Metabolic Disorder Labs: No results found for: HGBA1C, MPG No results found for: PROLACTIN No results found for: CHOL, TRIG, HDL, CHOLHDL, VLDL, LDLCALC  Physical Findings: AIMS:  , ,  ,  ,    CIWA:    COWS:      Psychiatric Specialty Exam:  Presentation  General Appearance:  Appropriate for Environment  Eye Contact: Fair  Speech: Pressured  Speech Volume: Increased    Mood and Affect  Mood: Euphoric  Affect: Congruent   Thought Process  Thought Processes: Coherent; Goal Directed  Descriptions of Associations:Intact  Orientation:Partial  Thought Content:Illusions  Hallucinations:Hallucinations: Visual   Ideas of Reference:None  Suicidal Thoughts:Suicidal Thoughts: No   Homicidal Thoughts:Homicidal Thoughts: No    Sensorium  Memory: Immediate Fair; Recent Fair; Remote Fair  Judgment: Fair  Insight: Fair   Art therapist  Concentration: Fair  Attention Span: Fair  Recall: [employer] of Knowledge: Fair  Language: Fair   Psychomotor Activity  Psychomotor Activity: Psychomotor Activity: Normal   Musculoskeletal: Strength & Muscle Tone: within normal limits Gait & Station: unsteady Assets  Assets: Financial Resources/Insurance; Desire for Improvement; Communication Skills; Social Support    Physical Exam: Physical Exam Vitals and nursing note reviewed.  HENT:     Head: Normocephalic.    Neurological:  Mental Status: She is alert.    Review of Systems  Constitutional: Negative.   HENT: Negative.    Eyes: Negative.   Skin: Negative.    Blood pressure (!) 143/63, pulse 88, temperature (!) 97 F (36.1 C), resp. rate 18, height 5' 7 (1.702 m), weight 80.3 kg, SpO2 99%. Body mass index is 27.72 kg/m.  Diagnosis: Principal Problem:   Bipolar disorder (HCC)   PLAN: Safety and Monitoring:  -- Voluntary admission to inpatient psychiatric unit for safety, stabilization and treatment  -- Daily contact with patient to assess and evaluate symptoms and progress in treatment  -- Patient's case to be discussed in multi-disciplinary team meeting  -- Observation Level : q15 minute checks  -- Vital signs:  q12 hours  -- Precautions: suicide, elopement, and assault -- Encouraged patient to participate in unit milieu and in scheduled group therapies  2. Psychiatric Diagnoses and Treatment:   Continue Seroquel  25 mg in the morning and increase to 100 mg at bedtime. Increased lamictal  to 25 mg QAM and 100mg  QHS     3. Medical Issues Being Addressed:   No urgent medical need identified  4. Discharge Planning:   -- Social work and case management to assist with discharge planning and identification of hospital follow-up needs prior to discharge  -- Estimated LOS: 3-4 days  Albert Huff, MD 10/31/2023, 11:58 AM

## 2023-10-31 NOTE — Progress Notes (Signed)
   10/31/23 1000  Psych Admission Type (Psych Patients Only)  Admission Status Voluntary  Psychosocial Assessment  Patient Complaints None  Eye Contact Fair  Facial Expression Animated  Affect Appropriate to circumstance  Speech Logical/coherent  Interaction Assertive  Motor Activity Slow  Appearance/Hygiene In scrubs  Behavior Characteristics Cooperative  Mood Anxious  Thought Process  Coherency Flight of ideas  Content Religiosity  Delusions None reported or observed  Perception Hallucinations  Hallucination Visual  Judgment Impaired  Confusion None  Danger to Self  Current suicidal ideation? Denies  Agreement Not to Harm Self Yes  Description of Agreement denies  Danger to Others  Danger to Others None reported or observed

## 2023-10-31 NOTE — Group Note (Signed)
 Date:  10/31/2023 Time:  2:02 AM  Group Topic/Focus:  Wrap-Up Group:   The focus of this group is to help patients review their daily goal of treatment and discuss progress on daily workbooks.    Participation Level:  Active  Participation Quality:  Appropriate and Attentive  Affect:  Appropriate  Cognitive:  Alert and Appropriate  Insight: Appropriate  Engagement in Group:  Engaged and Supportive  Modes of Intervention:  Discussion and Orientation  Additional Comments:     Maglione,Nautia Lem E 10/31/2023, 2:02 AM

## 2023-11-01 ENCOUNTER — Inpatient Hospital Stay

## 2023-11-01 DIAGNOSIS — R600 Localized edema: Secondary | ICD-10-CM

## 2023-11-01 DIAGNOSIS — F3112 Bipolar disorder, current episode manic without psychotic features, moderate: Secondary | ICD-10-CM | POA: Diagnosis not present

## 2023-11-01 LAB — URINALYSIS, COMPLETE (UACMP) WITH MICROSCOPIC
Bacteria, UA: NONE SEEN
Bilirubin Urine: NEGATIVE
Glucose, UA: NEGATIVE mg/dL
Ketones, ur: NEGATIVE mg/dL
Leukocytes,Ua: NEGATIVE
Nitrite: NEGATIVE
Protein, ur: NEGATIVE mg/dL
Specific Gravity, Urine: 1.004 — ABNORMAL LOW (ref 1.005–1.030)
pH: 5 (ref 5.0–8.0)

## 2023-11-01 MED ORDER — DIPHENHYDRAMINE-ZINC ACETATE 2-0.1 % EX CREA
TOPICAL_CREAM | Freq: Three times a day (TID) | CUTANEOUS | Status: DC | PRN
  Administered 2023-11-01: 1 via TOPICAL
  Filled 2023-11-01: qty 28

## 2023-11-01 MED ORDER — FUROSEMIDE 20 MG PO TABS: 20.0000 mg | ORAL_TABLET | Freq: Every day | ORAL | Status: DC

## 2023-11-01 MED ORDER — FUROSEMIDE 20 MG PO TABS: 40.0000 mg | ORAL_TABLET | Freq: Every day | ORAL | Status: DC

## 2023-11-01 MED ORDER — FUROSEMIDE 20 MG PO TABS
80.0000 mg | ORAL_TABLET | Freq: Once | ORAL | Status: AC
  Administered 2023-11-01: 80 mg via ORAL
  Filled 2023-11-01: qty 4

## 2023-11-01 MED ORDER — HYDROCORTISONE (PERIANAL) 2.5 % EX CREA
TOPICAL_CREAM | Freq: Four times a day (QID) | CUTANEOUS | Status: DC
  Administered 2023-11-01 (×3): 1 via RECTAL
  Filled 2023-11-01 (×2): qty 28.35

## 2023-11-01 NOTE — Plan of Care (Signed)

## 2023-11-01 NOTE — Progress Notes (Signed)
 Patient admitted voluntarily to [facility] on October 28, 2023 for complaints of visual hallucinations of seeing monsters on her wall, fridge handles turning into snakes, complaints of poor sleep, and reports of mania.  Patient denies SI/HI. She endorses visual hallucinations of seeing people moving. Engaged in active listening.   Patient's main complaint is leg discomfort/swelling and hemorrhoidal issues. Rectal cream scheduled 4 times daily. Leg swelling: Lasix  ordered daily, received ultrasound and chest xray. Echocardiogram to be performed tomorrow June 16. Ultrasound results - Doppler waveforms show normal direction of venous flow, normal respiratory plasticity and response to augmentation. Soft tissue edema distally. IMPRESSION: Negative. Chest xray results - IMPRESSION: No acute cardio pulmonary abnormality.  Q15 minute unit checks in place.

## 2023-11-01 NOTE — Group Note (Signed)
 Date:  11/01/2023 Time:  11:21 AM  Group Topic/Focus:  Outside Rec/Music Therapy  The purpose of this group is to allow patients to go out and get air while doing outside activities also listening to soothing music     Participation Level:  Did Not Attend  Stephanie Anthony 11/01/2023, 11:21 AM

## 2023-11-01 NOTE — Consult Note (Signed)
 Initial Consultation Note   Patient: Stephanie Anthony DOB: 1933-11-24 PCP: Monique Ano, MD DOA: 10/28/2023 DOS: the patient was seen and examined on 11/01/2023 Primary service: Aurelia Blotter, MD  Referring physician: Dr. Gaynell Keeler Reason for consult: leg swelling  Assessment/Plan:  Peripheral edema -Rule out new onset of CHF -Check Echo - DVT study - Received 1 dose of 80 mg p.o. Lasix  today.  Start p.o. Lasix  40 mg tomorrow and monitor kidney function 2-3 times a week.  CKD stage IV - Fluid overload, creatinine level stable 1.6 compared to baseline 1.7-2.0 from 3 years-33-month ago - Check UA - Diuresis as above  HTN - Blood pressure and creatinine level stable, continue losartan   Hemorrhoid - Continue hydrocortisone cream  Bipolar disorder - As per primary team   TRH will continue to follow the patient.  HPI: Stephanie Anthony is a 89 y.o. female with past medical history of CKD stage IV, HTN, bipolar disorder, was found to have increasing lower extremity swelling.  Patient reported that she has been noticed increasing of ankle swelling for about 2 weeks and gradually getting worse.  Denied any urinary amount or color changes no chest pain.  She does reported that occasionally ambulation from her house to mailbox back-and-forth can cause shortness of breath but denied any orthopnea.  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 Rhesa Celeste for CKD every 4 months.  She said that on recent visit there was a discussion between her and nephrology regarding possibility of future HD.  Review of Systems: As mentioned in the history of present illness. All other systems reviewed and are negative. Past Medical History:  Diagnosis Date   Arthritis    Bipolar 1 disorder (HCC)    Cataracts, bilateral    Chronic kidney disease    stage 3   Chronic kidney disease     Chronic mitral valve disease    Coronary artery disease    Deviated nasal septum    Erosive esophagitis    Erosive esophagitis    Esophageal stricture    Fibroid tumor    GERD (gastroesophageal reflux disease)    History of hiatal hernia    Hypertension    Hypothyroidism    Murmur, cardiac    Nephrogenic diabetes insipidus (HCC)    Pure hypercholesterolemia    Renal cyst, left    bilateral   Seasonal allergies    Past Surgical History:  Procedure Laterality Date   CARDIAC CATHETERIZATION     CHOLECYSTECTOMY     COLONOSCOPY WITH PROPOFOL  N/A 06/01/2018   Procedure: COLONOSCOPY WITH PROPOFOL ;  Surgeon: Deveron Fly, MD;  Location: [facility] ENDOSCOPY;  Service: Endoscopy;  Laterality: N/A;   ESOPHAGEAL DILATION     ESOPHAGOGASTRODUODENOSCOPY (EGD) WITH PROPOFOL  N/A 10/20/2014   Procedure: ESOPHAGOGASTRODUODENOSCOPY (EGD) WITH PROPOFOL ;  Surgeon: Deveron Fly, MD;  Location: [facility] ENDOSCOPY;  Service: Endoscopy;  Laterality: N/A;   ESOPHAGOGASTRODUODENOSCOPY (EGD) WITH PROPOFOL  N/A 06/01/2018   Procedure: ESOPHAGOGASTRODUODENOSCOPY (EGD) WITH PROPOFOL ;  Surgeon: Deveron Fly, MD;  Location: [facility] ENDOSCOPY;  Service: Endoscopy;  Laterality: N/A;   INFECTED SKIN DEBRIDEMENT     on back several years ago   JOINT REPLACEMENT     KNEE ARTHROPLASTY Left 06/10/2023   Procedure: COMPUTER ASSISTED TOTAL KNEE ARTHROPLASTY;  Surgeon: Arlyne Lame, MD;  Location: [facility] ORS;  Service: Orthopedics;  Laterality: Left;   knee replacement,  right     LAPAROSCOPIC APPENDECTOMY N/A 06/21/2018   Procedure: APPENDECTOMY LAPAROSCOPIC;  Surgeon: Eldred Grego, MD;  Location: [facility] ORS;  Service: General;  Laterality: N/A;   NOSE SURGERY     THYROIDECTOMY     TOTAL THYROIDECTOMY     Social History:  reports that she has quit smoking. She has never used smokeless tobacco. She reports that she does not drink alcohol and does not use drugs.  Allergies  Allergen Reactions   Aspirin  Other  (See Comments)    Rectal Bleeding   Prednisone Other (See Comments)    Hallucinations   Codeine Nausea And Vomiting   Valium [Diazepam] Other (See Comments)    Uknown     Family History  Problem Relation Age of Onset   Heart Problems Mother    Seizures Mother    Stroke Mother    Diverticulitis Mother    Alcohol abuse Father    Heart attack Father    Heart attack Sister    Pancreatitis Sister    Cancer Sister    Cancer Brother     Prior to Admission medications   Medication Sig Start Date End Date Taking? Authorizing Provider  ARIPiprazole  (ABILIFY ) 2 MG tablet Take 1 tablet (2 mg total) by mouth at bedtime. 10/20/23 11/19/23  Todd Fossa, MD  lamoTRIgine  (LAMICTAL ) 100 MG tablet Take 1 tablet (100 mg total) by mouth at bedtime. 10/06/23 01/04/24  Todd Fossa, MD  latanoprost  (XALATAN ) 0.005 % ophthalmic solution Place 1 drop into both eyes at bedtime.    [provider]  levothyroxine  (SYNTHROID ) 100 MCG tablet Take 100 mcg by mouth daily before breakfast. 10/30/14 10/28/23  [provider]  losartan  (COZAAR ) 25 MG tablet Take 25 mg by mouth daily.    [provider]  lovastatin (MEVACOR) 40 MG tablet Take 40 mg by mouth at bedtime.  12/06/14   [provider]  pantoprazole  (PROTONIX ) 40 MG tablet Take 40 mg by mouth daily.     [provider]    Physical Exam: Vitals:   10/30/23 1955 10/31/23 0706 10/31/23 1921 11/01/23 0733  BP: 105/81 (!) 143/63 (!) 123/55 (!) 119/55  Pulse: 74 88 76 79  Resp: (!) 22 18 20 18   Temp: (!) 97.3 F (36.3 C) (!) 97 F (36.1 C) (!) 97.3 F (36.3 C) (!) 97 F (36.1 C)  TempSrc:      SpO2: 100% 99% 100% 99%  Weight:      Height:       Eyes: PERRL, lids and conjunctivae normal ENMT: Mucous membranes are moist. Posterior pharynx clear of any exudate or lesions.Normal dentition.  Neck: normal, supple, no masses, no thyromegaly Respiratory: clear to auscultation bilaterally, no wheezing, no  crackles. Normal respiratory effort. No accessory muscle use.  Cardiovascular: Regular rate and rhythm, no murmurs / rubs / gallops. 2+ extremity edema to bilateral knee level. 2+ pedal pulses. No carotid bruits.  Abdomen: no tenderness, no masses palpated. No hepatosplenomegaly. Bowel sounds positive.  Musculoskeletal: no clubbing / cyanosis. No joint deformity upper and lower extremities. Good ROM, no contractures. Normal muscle tone.  Skin: no rashes, lesions, ulcers. No induration Neurologic: CN 2-12 grossly intact. Sensation intact, DTR normal.  Muscle strength 5/5 on both sides Psychiatric: Normal judgment and insight. Alert and oriented x 3. Normal mood.    Data Reviewed:  Recent blood work including CBC BMP revealed Family Communication: None Primary team communication: Psychiatry team Thank you very much for involving us  in the  care of your patient.  Author: [place], MD 11/01/2023 1:24 PM  For on call review [email or web address].

## 2023-11-01 NOTE — Group Note (Signed)
 Date:  11/01/2023 Time:  11:31 PM  Group Topic/Focus:  Wrap-Up Group:   The focus of this group is to help patients review their daily goal of treatment and discuss progress on daily workbooks.    Participation Level:  Active  Participation Quality:  Appropriate  Affect:  Appropriate  Cognitive:  Appropriate  Insight: Good  Engagement in Group:  Engaged  Modes of Intervention:  Discussion  Additional Comments:    Stephanie Anthony 11/01/2023, 11:31 PM

## 2023-11-01 NOTE — Progress Notes (Addendum)
 [facility] MD Progress Note  11/01/2023 12:43 PM Stephanie Anthony  MRN:  [id number]  Patient is a 89 y/o WF presents to ED with worsening hallucinations, mania.  She reports her psychiatrist told her to stop taking her aripiprazole  recently due to kidney function concerns.  However, review of medical records indicates this medication was  discontinued 09/29/22 due to TD and Parkinson symptoms.  The initial plan documented was to increase lamotrigine  if needed; however, appears she has been fairly stable on lamotrigine  current dose since discontinue of aripiprazole  until recently; she is noted to have a UTI which most likely has contributed to recent mania episode. Patient is admitted to [facility] unit with Q15 min safety monitoring. Multidisciplinary team approach is offered. Medication management; group/milieu therapy is offered.   Subjective:  Per nursing report patient had leg swelling and was complaining of hemorrhoids.  Red color blood noticed on wipe.   Nurses continues to report that patient is still flirtatious but not overtly hypersexual. Patient on interview reports having bleeding and some yellow discharge per rectum.  Reviewed patient outpatient chart dated 10/19/2023 where patient was started on Anusol and no medication for edema.  Started patient again on  Anusol and will add Lasix  given significant edema.  Will give one-time dose of 80 mg once.  We will do BMP tomorrow.  Patient does not have any history of any heart failure.  Patient reports that she is not feeling well today and she has headache and body pain.  Will give as needed pain medication.    Patient continues to report of still having visual hallucinations though much better from before.  Will not change any psychiatric medication today.  Will continue the same medication will continue to follow her closely.  Hospitalist consult ordered for leg swelling.    Sleep: Fair  Appetite:  Fair  Past Psychiatric History: see h&P Family  History:  Family History  Problem Relation Age of Onset   Heart Problems Mother    Seizures Mother    Stroke Mother    Diverticulitis Mother    Alcohol abuse Father    Heart attack Father    Heart attack Sister    Pancreatitis Sister    Cancer Sister    Cancer Brother    Social History:  Social History   Substance and Sexual Activity  Alcohol Use No   Alcohol/week: 0.0 standard drinks of alcohol     Social History   Substance and Sexual Activity  Drug Use No    Social History   Socioeconomic History   Marital status: Widowed    Spouse name: Not on file   Number of children: 2   Years of education: Not on file   Highest education level: 6th grade  Occupational History   Not on file  Tobacco Use   Smoking status: Former   Smokeless tobacco: Never   Tobacco comments:    quit 1964  Vaping Use   Vaping status: Never Used  Substance and Sexual Activity   Alcohol use: No    Alcohol/week: 0.0 standard drinks of alcohol   Drug use: No   Sexual activity: Not Currently  Other Topics Concern   Not on file  Social History Narrative   Lives at home alone. Son lives in [place].   Social Drivers of Corporate investment banker Strain: Low Risk  (08/05/2022)   Received [facility]   Overall Financial Resource Strain (CARDIA)  Difficulty of Paying Living Expenses: Not hard at all  Food Insecurity: No Food Insecurity (10/28/2023)   Hunger Vital Sign    Worried About Running Out of Food in the Last Year: Never true    Ran Out of Food in the Last Year: Never true  Transportation Needs: No Transportation Needs (10/28/2023)   PRAPARE - Administrator, Civil Service (Medical): No    Lack of Transportation (Non-Medical): No  Physical Activity: Not on file  Stress: Not on file  Social Connections: Socially Isolated (10/28/2023)   Social Connection and Isolation Panel    Frequency of Communication with Friends and Family: More than three  times a week    Frequency of Social Gatherings with Friends and Family: More than three times a week    Attends Religious Services: Never    Database administrator or Organizations: No    Attends Banker Meetings: Never    Marital Status: Widowed   Past Medical History:  Past Medical History:  Diagnosis Date   Arthritis    Bipolar 1 disorder (HCC)    Cataracts, bilateral    Chronic kidney disease    stage 3   Chronic kidney disease    Chronic mitral valve disease    Coronary artery disease    Deviated nasal septum    Erosive esophagitis    Erosive esophagitis    Esophageal stricture    Fibroid tumor    GERD (gastroesophageal reflux disease)    History of hiatal hernia    Hypertension    Hypothyroidism    Murmur, cardiac    Nephrogenic diabetes insipidus (HCC)    Pure hypercholesterolemia    Renal cyst, left    bilateral   Seasonal allergies     Past Surgical History:  Procedure Laterality Date   CARDIAC CATHETERIZATION     CHOLECYSTECTOMY     COLONOSCOPY WITH PROPOFOL  N/A 06/01/2018   Procedure: COLONOSCOPY WITH PROPOFOL ;  Surgeon: Deveron Fly, MD;  Location: [facility] ENDOSCOPY;  Service: Endoscopy;  Laterality: N/A;   ESOPHAGEAL DILATION     ESOPHAGOGASTRODUODENOSCOPY (EGD) WITH PROPOFOL  N/A 10/20/2014   Procedure: ESOPHAGOGASTRODUODENOSCOPY (EGD) WITH PROPOFOL ;  Surgeon: Deveron Fly, MD;  Location: [facility] ENDOSCOPY;  Service: Endoscopy;  Laterality: N/A;   ESOPHAGOGASTRODUODENOSCOPY (EGD) WITH PROPOFOL  N/A 06/01/2018   Procedure: ESOPHAGOGASTRODUODENOSCOPY (EGD) WITH PROPOFOL ;  Surgeon: Deveron Fly, MD;  Location: [facility] ENDOSCOPY;  Service: Endoscopy;  Laterality: N/A;   INFECTED SKIN DEBRIDEMENT     on back several years ago   JOINT REPLACEMENT     KNEE ARTHROPLASTY Left 06/10/2023   Procedure: COMPUTER ASSISTED TOTAL KNEE ARTHROPLASTY;  Surgeon: Arlyne Lame, MD;  Location: [facility] ORS;  Service: Orthopedics;  Laterality: Left;   knee  replacement, right     LAPAROSCOPIC APPENDECTOMY N/A 06/21/2018   Procedure: APPENDECTOMY LAPAROSCOPIC;  Surgeon: Eldred Grego, MD;  Location: [facility] ORS;  Service: General;  Laterality: N/A;   NOSE SURGERY     THYROIDECTOMY     TOTAL THYROIDECTOMY      Current Medications: Current Facility-Administered Medications  Medication Dose Route Frequency Provider Last Rate Last Admin   acetaminophen  (TYLENOL ) tablet 650 mg  650 mg Oral Q6H PRN Penn, Cicely, NP       alum & mag hydroxide-simeth (MAALOX/MYLANTA) 200-200-20 MG/5ML suspension 30 mL  30 mL Oral Q4H PRN Penn, Cicely, NP   30 mL at 11/01/23 0156   cephALEXin  (KEFLEX ) capsule 500 mg  500 mg Oral Q12H  Penn, Alaine Howells, NP   500 mg at 11/01/23 4742   diphenhydrAMINE -zinc acetate (BENADRYL ) 2-0.1 % cream   Topical TID PRN Trevyn Lumpkin, MD   1 Application at 11/01/23 0941   furosemide  (LASIX ) tablet 20 mg  20 mg Oral Daily Jisselle Poth, MD       hydrocortisone (ANUSOL-HC) 2.5 % rectal cream   Rectal QID Ryli Standlee, MD   1 Application at 11/01/23 0948   lamoTRIgine  (LAMICTAL ) tablet 100 mg  100 mg Oral QHS Jadapalle, Sree, MD   100 mg at 10/31/23 2139   lamoTRIgine  (LAMICTAL ) tablet 25 mg  25 mg Oral Daily Jadapalle, Sree, MD   25 mg at 11/01/23 0949   latanoprost  (XALATAN ) 0.005 % ophthalmic solution 1 drop  1 drop Both Eyes QHS Jadapalle, Sree, MD   1 drop at 10/31/23 2138   levothyroxine  (SYNTHROID ) tablet 100 mcg  100 mcg Oral Q0600 Jadapalle, Sree, MD   100 mcg at 11/01/23 0634   losartan  (COZAAR ) tablet 25 mg  25 mg Oral Daily Jadapalle, Sree, MD   25 mg at 11/01/23 5956   OLANZapine  (ZYPREXA ) injection 5 mg  5 mg Intramuscular TID PRN Monroe Antigua, NP       OLANZapine  zydis (ZYPREXA ) disintegrating tablet 5 mg  5 mg Oral TID PRN Monroe Antigua, NP       pantoprazole  (PROTONIX ) EC tablet 40 mg  40 mg Oral Daily Jadapalle, Sree, MD   40 mg at 11/01/23 0949   pravastatin  (PRAVACHOL ) tablet 10 mg  10 mg Oral q1800  Jadapalle, Sree, MD   10 mg at 10/31/23 1703   QUEtiapine  (SEROQUEL ) tablet 100 mg  100 mg Oral QHS Amyia Lodwick, MD   100 mg at 10/31/23 2140   QUEtiapine  (SEROQUEL ) tablet 25 mg  25 mg Oral Q breakfast Penn, Cicely, NP   25 mg at 11/01/23 3875   traZODone  (DESYREL ) tablet 50 mg  50 mg Oral QHS PRN Monroe Antigua, NP   50 mg at 10/31/23 2140    Lab Results: No results found for this or any previous visit (from the past 48 hours).  Blood Alcohol level:  Lab Results  Component Value Date   [facility] <15 10/27/2023   ETH <5 07/07/2015    Metabolic Disorder Labs: Lab Results  Component Value Date   HGBA1C 5.8 (H) 10/27/2023   MPG 119.76 10/27/2023   No results found for: PROLACTIN No results found for: CHOL, TRIG, HDL, CHOLHDL, VLDL, LDLCALC  Physical Findings: AIMS:  , ,  ,  ,    CIWA:    COWS:      Psychiatric Specialty Exam:  Presentation  General Appearance:  Appropriate for Environment  Eye Contact: Fair  Speech: Pressured  Speech Volume: Increased    Mood and Affect  Mood: Euphoric  Affect: Congruent   Thought Process  Thought Processes: Coherent; Goal Directed  Descriptions of Associations:Intact  Orientation:Partial  Thought Content:Illusions  Hallucinations:Hallucinations: Visual   Ideas of Reference:None  Suicidal Thoughts:Suicidal Thoughts: No   Homicidal Thoughts:Homicidal Thoughts: No    Sensorium  Memory: Immediate Fair; Recent Fair; Remote Fair  Judgment: Fair  Insight: Fair   Art therapist  Concentration: Fair  Attention Span: Fair  Recall: [employer] of Knowledge: Fair  Language: Fair   Psychomotor Activity  Psychomotor Activity: Psychomotor Activity: Normal   Musculoskeletal: Strength & Muscle Tone: within normal limits Gait & Station: unsteady Assets  Assets: Financial Resources/Insurance; Desire for Improvement; Communication Skills; Social Support  Physical  Exam: Physical Exam Vitals and nursing note reviewed.  HENT:     Head: Normocephalic.   Neurological:     Mental Status: She is alert.    Review of Systems  Constitutional: Negative.   HENT: Negative.    Eyes: Negative.   Skin: Negative.    Blood pressure (!) 119/55, pulse 79, temperature (!) 97 F (36.1 C), resp. rate 18, height 5' 7 (1.702 m), weight 80.3 kg, SpO2 99%. Body mass index is 27.72 kg/m.  Diagnosis: Principal Problem:   Bipolar disorder (HCC)   PLAN: Safety and Monitoring:  -- Voluntary admission to inpatient psychiatric unit for safety, stabilization and treatment  -- Daily contact with patient to assess and evaluate symptoms and progress in treatment  -- Patient's case to be discussed in multi-disciplinary team meeting  -- Observation Level : q15 minute checks  -- Vital signs:  q12 hours  -- Precautions: suicide, elopement, and assault -- Encouraged patient to participate in unit milieu and in scheduled group therapies  2. Psychiatric Diagnoses and Treatment:   Continue Seroquel  25 mg in the morning and continue 100 mg at bedtime. Continue lamictal  to 25 mg QAM and 100mg  QHS     3. Medical Issues Being Addressed:   Started patient on Anusol and will give Lasix  80 mg 1 time dose.  Will start patient on Lasix  20 mg daily.  We will get check electrolytes/BMP tomorrow.  Also ordered hospitalist consult.  4. Discharge Planning:   -- Social work and case management to assist with discharge planning and identification of hospital follow-up needs prior to discharge  -- Estimated LOS: 3-4 days  Albert Huff, MD 11/01/2023, 12:43 PM

## 2023-11-01 NOTE — Progress Notes (Addendum)
 Alert and oriented. C/o b/l lower leg and ankle edema. +2 pitting edema noted. B/LLE elevated on pillows. PRN given ingestion. ABT therapy maintained for UTI. Tol well.  No adverse reaction noted.  No c/o pain/discomfort noted. Denies SI/HI.      10/31/23 2100  Psych Admission Type (Psych Patients Only)  Admission Status Voluntary  Psychosocial Assessment  Patient Complaints None  Eye Contact Fair  Facial Expression Animated  Affect Appropriate to circumstance  Speech Logical/coherent  Interaction Assertive  Motor Activity Slow  Appearance/Hygiene In scrubs  Behavior Characteristics Cooperative  Mood Anxious  Thought Process  Coherency Flight of ideas  Content Religiosity  Delusions Religious  Perception Hallucinations  Hallucination Visual  Judgment Impaired  Confusion Mild  Danger to Self  Current suicidal ideation? Denies

## 2023-11-01 NOTE — Progress Notes (Deleted)
 [facility] MD/PA/NP OP Progress Note  11/01/2023 11:43 AM Navdeep Sherlin Sonier  MRN:  [id number]  Chief Complaint: No chief complaint on file.  HPI: *** Visit Diagnosis: No diagnosis found.  Past Psychiatric History: ***  Past Medical History:  Past Medical History:  Diagnosis Date   Arthritis    Bipolar 1 disorder (HCC)    Cataracts, bilateral    Chronic kidney disease    stage 3   Chronic kidney disease    Chronic mitral valve disease    Coronary artery disease    Deviated nasal septum    Erosive esophagitis    Erosive esophagitis    Esophageal stricture    Fibroid tumor    GERD (gastroesophageal reflux disease)    History of hiatal hernia    Hypertension    Hypothyroidism    Murmur, cardiac    Nephrogenic diabetes insipidus (HCC)    Pure hypercholesterolemia    Renal cyst, left    bilateral   Seasonal allergies     Past Surgical History:  Procedure Laterality Date   CARDIAC CATHETERIZATION     CHOLECYSTECTOMY     COLONOSCOPY WITH PROPOFOL  N/A 06/01/2018   Procedure: COLONOSCOPY WITH PROPOFOL ;  Surgeon: Deveron Fly, MD;  Location: [facility] ENDOSCOPY;  Service: Endoscopy;  Laterality: N/A;   ESOPHAGEAL DILATION     ESOPHAGOGASTRODUODENOSCOPY (EGD) WITH PROPOFOL  N/A 10/20/2014   Procedure: ESOPHAGOGASTRODUODENOSCOPY (EGD) WITH PROPOFOL ;  Surgeon: Deveron Fly, MD;  Location: [facility] ENDOSCOPY;  Service: Endoscopy;  Laterality: N/A;   ESOPHAGOGASTRODUODENOSCOPY (EGD) WITH PROPOFOL  N/A 06/01/2018   Procedure: ESOPHAGOGASTRODUODENOSCOPY (EGD) WITH PROPOFOL ;  Surgeon: Deveron Fly, MD;  Location: [facility] ENDOSCOPY;  Service: Endoscopy;  Laterality: N/A;   INFECTED SKIN DEBRIDEMENT     on back several years ago   JOINT REPLACEMENT     KNEE ARTHROPLASTY Left 06/10/2023   Procedure: COMPUTER ASSISTED TOTAL KNEE ARTHROPLASTY;  Surgeon: Arlyne Lame, MD;  Location: [facility] ORS;  Service: Orthopedics;  Laterality: Left;   knee replacement, right     LAPAROSCOPIC APPENDECTOMY N/A  06/21/2018   Procedure: APPENDECTOMY LAPAROSCOPIC;  Surgeon: Eldred Grego, MD;  Location: [facility] ORS;  Service: General;  Laterality: N/A;   NOSE SURGERY     THYROIDECTOMY     TOTAL THYROIDECTOMY      Family Psychiatric History: ***  Family History:  Family History  Problem Relation Age of Onset   Heart Problems Mother    Seizures Mother    Stroke Mother    Diverticulitis Mother    Alcohol abuse Father    Heart attack Father    Heart attack Sister    Pancreatitis Sister    Cancer Sister    Cancer Brother     Social History:  Social History   Socioeconomic History   Marital status: Widowed    Spouse name: Not on file   Number of children: 2   Years of education: Not on file   Highest education level: 6th grade  Occupational History   Not on file  Tobacco Use   Smoking status: Former   Smokeless tobacco: Never   Tobacco comments:    quit 1964  Vaping Use   Vaping status: Never Used  Substance and Sexual Activity   Alcohol use: No    Alcohol/week: 0.0 standard drinks of alcohol   Drug use: No   Sexual activity: Not Currently  Other Topics Concern   Not on file  Social History Narrative   Lives at home alone. Son lives  in [place].   Social Drivers of Corporate investment banker Strain: Low Risk  (08/05/2022)   Received from [facility]   Overall Financial Resource Strain (CARDIA)    Difficulty of Paying Living Expenses: Not hard at all  Food Insecurity: No Food Insecurity (10/28/2023)   Hunger Vital Sign    Worried About Running Out of Food in the Last Year: Never true    Ran Out of Food in the Last Year: Never true  Transportation Needs: No Transportation Needs (10/28/2023)   PRAPARE - Administrator, Civil Service (Medical): No    Lack of Transportation (Non-Medical): No  Physical Activity: Not on file  Stress: Not on file  Social Connections: Socially Isolated (10/28/2023)   Social Connection and Isolation Panel     Frequency of Communication with Friends and Family: More than three times a week    Frequency of Social Gatherings with Friends and Family: More than three times a week    Attends Religious Services: Never    Database administrator or Organizations: No    Attends Banker Meetings: Never    Marital Status: Widowed    Allergies:  Allergies  Allergen Reactions   Aspirin  Other (See Comments)    Rectal Bleeding   Prednisone Other (See Comments)    Hallucinations   Codeine Nausea And Vomiting   Valium [Diazepam] Other (See Comments)    Uknown     Metabolic Disorder Labs: Lab Results  Component Value Date   HGBA1C 5.8 (H) 10/27/2023   MPG 119.76 10/27/2023   No results found for: PROLACTIN No results found for: CHOL, TRIG, HDL, CHOLHDL, VLDL, LDLCALC Lab Results  Component Value Date   TSH 2.770 02/04/2020   TSH 14.604 (H) 05/01/2015    Therapeutic Level Labs: No results found for: LITHIUM No results found for: VALPROATE No results found for: CBMZ  Current Medications: No current facility-administered medications for this visit.   No current outpatient medications on file.   Facility-Administered Medications Ordered in Other Visits  Medication Dose Route Frequency Provider Last Rate Last Admin   acetaminophen  (TYLENOL ) tablet 650 mg  650 mg Oral Q6H PRN Penn, Cicely, NP       alum & mag hydroxide-simeth (MAALOX/MYLANTA) 200-200-20 MG/5ML suspension 30 mL  30 mL Oral Q4H PRN Penn, Cicely, NP   30 mL at 11/01/23 0156   cephALEXin  (KEFLEX ) capsule 500 mg  500 mg Oral Q12H Penn, Cicely, NP   500 mg at 11/01/23 7829   diphenhydrAMINE -zinc acetate (BENADRYL ) 2-0.1 % cream   Topical TID PRN Shrivastava, Aryendra, MD   1 Application at 11/01/23 0941   furosemide  (LASIX ) tablet 20 mg  20 mg Oral Daily Shrivastava, Aryendra, MD       furosemide  (LASIX ) tablet 80 mg  80 mg Oral Once Shrivastava, Aryendra, MD       hydrocortisone (ANUSOL-HC) 2.5 %  rectal cream   Rectal QID Shrivastava, Aryendra, MD   1 Application at 11/01/23 0948   lamoTRIgine  (LAMICTAL ) tablet 100 mg  100 mg Oral QHS Jadapalle, Sree, MD   100 mg at 10/31/23 2139   lamoTRIgine  (LAMICTAL ) tablet 25 mg  25 mg Oral Daily Jadapalle, Sree, MD   25 mg at 11/01/23 0949   latanoprost  (XALATAN ) 0.005 % ophthalmic solution 1 drop  1 drop Both Eyes QHS Jadapalle, Sree, MD   1 drop at 10/31/23 2138   levothyroxine  (SYNTHROID ) tablet 100 mcg  100 mcg Oral  Q0600 Jadapalle, Sree, MD   100 mcg at 11/01/23 6045   losartan  (COZAAR ) tablet 25 mg  25 mg Oral Daily Jadapalle, Sree, MD   25 mg at 11/01/23 4098   OLANZapine  (ZYPREXA ) injection 5 mg  5 mg Intramuscular TID PRN Monroe Antigua, NP       OLANZapine  zydis (ZYPREXA ) disintegrating tablet 5 mg  5 mg Oral TID PRN Monroe Antigua, NP       pantoprazole  (PROTONIX ) EC tablet 40 mg  40 mg Oral Daily Jadapalle, Sree, MD   40 mg at 11/01/23 1191   pravastatin  (PRAVACHOL ) tablet 10 mg  10 mg Oral q1800 Jadapalle, Sree, MD   10 mg at 10/31/23 1703   QUEtiapine  (SEROQUEL ) tablet 100 mg  100 mg Oral QHS Shrivastava, Aryendra, MD   100 mg at 10/31/23 2140   QUEtiapine  (SEROQUEL ) tablet 25 mg  25 mg Oral Q breakfast Penn, Alaine Howells, NP   25 mg at 11/01/23 4782   traZODone  (DESYREL ) tablet 50 mg  50 mg Oral QHS PRN Monroe Antigua, NP   50 mg at 10/31/23 2140     Musculoskeletal: Strength & Muscle Tone: {desc; muscle tone:32375} Gait & Station: {PE GAIT ED NFAO:13086} Patient leans: {Patient Leans:21022755}  Psychiatric Specialty Exam: Review of Systems  There were no vitals taken for this visit.There is no height or weight on file to calculate BMI.  General Appearance: {Appearance:22683}  Eye Contact:  {BHH EYE CONTACT:22684}  Speech:  {Speech:22685}  Volume:  {Volume (PAA):22686}  Mood:  {BHH MOOD:22306}  Affect:  {Affect (PAA):22687}  Thought Process:  {Thought Process (PAA):22688}  Orientation:  {BHH ORIENTATION (PAA):22689}  Thought Content:  {Thought Content:22690}   Suicidal Thoughts:  {ST/HT (PAA):22692}  Homicidal Thoughts:  {ST/HT (PAA):22692}  Memory:  {BHH MEMORY:22881}  Judgement:  {Judgement (PAA):22694}  Insight:  {Insight (PAA):22695}  Psychomotor Activity:  {Psychomotor (PAA):22696}  Concentration:  {Concentration:21399}  Recall:  {BHH GOOD/FAIR/POOR:22877}  Fund of Knowledge: {BHH GOOD/FAIR/POOR:22877}  Language: {BHH GOOD/FAIR/POOR:22877}  Akathisia:  {BHH YES OR NO:22294}  Handed:  {Handed:22697}  AIMS (if indicated): {Desc; done/not:10129}  Assets:  {Assets (PAA):22698}  ADL's:  {BHH VHQ'I:69629}  Cognition: {chl bhh cognition:304700322}  Sleep:  {BHH GOOD/FAIR/POOR:22877}   Screenings: AUDIT    Flowsheet Row Admission (Current) from 10/28/2023 in [facility] BE[facility]IORAL [facility]  Alcohol Use Disorder Identification Test Final Score (AUDIT) 0   GAD-7    Flowsheet Row Office Visit from 06/01/2023 in [facility] Office Visit from 07/10/2022 in [facility] Office Visit from 04/01/2022 in [facility]  Total GAD-7 Score 3 9 8    PHQ2-9    Flowsheet Row Office Visit from 06/01/2023 in [facility] Office Visit from 09/29/2022 in [facility] Office Visit from 07/10/2022 in [facility] Office Visit from 04/01/2022 in [facility]  PHQ-2 Total Score 1 2 3 4   PHQ-9 Total Score -- 6 9 10    Flowsheet Row Admission (Current) from 10/28/2023 in [facility] BEHAVIORAL MEDICINE ED from 10/27/2023 in [facility] Emergency Department at [facility] ED from 06/29/2023 in [facility] Emergency Department at [facility]  C-SSRS RISK CATEGORY No Risk No Risk No Risk     Assessment and Plan: ***  Collaboration of Care: Collaboration  of Care: [facility] OP Collaboration of Care:[id number]}  Patient/Guardian was advised Release of Information must be obtained prior to any record  release in order to collaborate their care with an outside provider. Patient/Guardian was advised if they have not already done so to contact the registration department to sign all necessary forms in order for us  to release information regarding their care.   Consent: Patient/Guardian gives verbal consent for treatment and assignment of benefits for services provided during this visit. Patient/Guardian expressed understanding and agreed to proceed.    Todd Fossa, MD 11/01/2023, 11:43 AM

## 2023-11-01 NOTE — Plan of Care (Signed)
°  Problem: Self-Care: Goal: Ability to participate in self-care as condition permits will improve Outcome: Progressing   Problem: Self-Concept: Goal: Will verbalize positive feelings about self Outcome: Progressing

## 2023-11-02 DIAGNOSIS — N184 Chronic kidney disease, stage 4 (severe): Secondary | ICD-10-CM

## 2023-11-02 DIAGNOSIS — E039 Hypothyroidism, unspecified: Secondary | ICD-10-CM

## 2023-11-02 DIAGNOSIS — I10 Essential (primary) hypertension: Secondary | ICD-10-CM

## 2023-11-02 LAB — BASIC METABOLIC PANEL WITH GFR
Anion gap: 12 (ref 5–15)
BUN: 43 mg/dL — ABNORMAL HIGH (ref 8–23)
CO2: 22 mmol/L (ref 22–32)
Calcium: 8.7 mg/dL — ABNORMAL LOW (ref 8.9–10.3)
Chloride: 103 mmol/L (ref 98–111)
Creatinine, Ser: 1.85 mg/dL — ABNORMAL HIGH (ref 0.44–1.00)
GFR, Estimated: 26 mL/min — ABNORMAL LOW (ref >60)
Glucose, Bld: 130 mg/dL — ABNORMAL HIGH (ref 70–99)
Potassium: 3.9 mmol/L (ref 3.5–5.1)
Sodium: 137 mmol/L (ref 135–145)

## 2023-11-02 LAB — TSH: TSH: 9.363 u[IU]/mL — ABNORMAL HIGH (ref 0.350–4.500)

## 2023-11-02 LAB — T4, FREE: Free T4: 0.74 ng/dL (ref 0.61–1.12)

## 2023-11-02 LAB — BRAIN NATRIURETIC PEPTIDE: B Natriuretic Peptide: 67.9 pg/mL (ref 0.0–100.0)

## 2023-11-02 MED ORDER — FUROSEMIDE 20 MG PO TABS
40.0000 mg | ORAL_TABLET | Freq: Two times a day (BID) | ORAL | Status: DC
  Administered 2023-11-02 – 2023-11-03 (×2): 40 mg via ORAL
  Filled 2023-11-02 (×2): qty 2

## 2023-11-02 MED ORDER — LEVOTHYROXINE SODIUM 112 MCG PO TABS
112.0000 ug | ORAL_TABLET | Freq: Every day | ORAL | Status: DC
  Administered 2023-11-03 – 2023-11-05 (×3): 112 ug via ORAL
  Filled 2023-11-02 (×3): qty 1

## 2023-11-02 NOTE — Progress Notes (Signed)
 Consultation Progress Note   Patient: Stephanie Anthony ZOX:[id number] DOB: 01-Feb-1934 DOA: 10/28/2023 DOS: the patient was seen and examined on 11/02/2023 Primary service: Aurelia Blotter, MD  Brief hospital course: Taken from original consult note.  Stephanie Anthony is a 89 y.o. female with past medical history of CKD stage IV, HTN, bipolar disorder, who was admitted at behavioral health, was found to have increasing lower extremity swelling.  We were consulted for worsening peripheral edema.  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  Patient was started on p.o. Lasix .  6/16: Vital stable, TSH elevated at 9.363-ordered free T4, UA negative.  BMP with slight worsening of creatinine to 1.85 as compared to the admission 6 days ago.  BNP at 67. patient did receive 80 mg of Lasix  yesterday. Lower extremity venous Doppler negative for DVT.  Echocardiogram pending. Significant lower extremity edema so continuing p.o. Lasix  with close monitoring of renal function.  Patient will also get benefit from compression stockings.  Assessment and Plan: * Bipolar disorder (HCC) - As per primary team at behavioral health  Peripheral edema Patient with significant lower extremity edema.  No shortness of breath or orthopnea.  BNP normal.  TSH elevated with low normal free T4. - Continue with p.o. Lasix  - Compression stockings ordered -Raise legs while sitting -Echocardiogram ordered-pending -Monitor renal function while being diuresed  Hypertension Blood pressure currently within goal. -Continuing home losartan  -Monitor closely as we added Lasix   CKD (chronic kidney disease), stage IV (HCC) Renal function currently within baseline but slightly increased creatinine as compared to the admission. -Monitor renal function closely while she is being diuresed -Avoid nephrotoxins  Hypothyroidism Elevated  TSH at 9.363 with low normal free T4. - Increasing the dose of Synthroid  to 112 mcg -Patient will need a repeat test in about 4 weeks by PCP   TR[facility] will continue to follow the patient.  Subjective: Patient was seen at behavioral health today.  Sitting in a dining table.  Denies any shortness of breath or orthopnea.  Worsening lower extremity edema for some time. Difficult to engage in a meaningful conversation.  As she was keeps jumping from 1 subject to the other.  Physical Exam: Vitals:   11/01/23 0733 11/01/23 1937 11/02/23 0021 11/02/23 0709  BP: (!) 119/55 105/66 100/80 (!) 100/37  Pulse: 79 79 84 73  Resp: 18 14  18   Temp: (!) 97 F (36.1 C) (!) 97.5 F (36.4 C) (!) 97.5 F (36.4 C) (!) 97.3 F (36.3 C)  TempSrc:   Oral   SpO2: 99% 100% 99% 99%  Weight:      Height:       General.  Frail elderly lady, in no acute distress. Pulmonary.  Lungs clear bilaterally, normal respiratory effort. CV.  Regular rate and rhythm, no JVD, rub or murmur. Abdomen.  Soft, nontender, nondistended, BS positive. CNS.  Alert and oriented .  No focal neurologic deficit. Extremities.  2+ LE edema, pulses intact and symmetrical.  Data Reviewed: Prior data reviewed  Family Communication: Primary team is communicating with family  Time spent: 45 minutes.  This record has been created using Conservation officer, historic buildings. Errors have been sought and corrected,but may not always be located. Such creation errors do not reflect on the standard of care.   Author: Luna Salinas, MD 11/02/2023 1:23 PM  For on call review [email or web address].

## 2023-11-02 NOTE — Progress Notes (Signed)
 [facility] MD Progress Note  11/02/2023 6:55 PM Stephanie Anthony  MRN:  [id number]  Patient is a 89 y/o WF presents to ED with worsening hallucinations, mania.  She reports her psychiatrist told her to stop taking her aripiprazole  recently due to kidney function concerns.  However, review of medical records indicates this medication was  discontinued 09/29/22 due to TD and Parkinson symptoms.  The initial plan documented was to increase lamotrigine  if needed; however, appears she has been fairly stable on lamotrigine  current dose since discontinue of aripiprazole  until recently; she is noted to have a UTI which most likely has contributed to recent mania episode. Patient is admitted to [facility] unit with Q15 min safety monitoring. Multidisciplinary team approach is offered. Medication management; group/milieu therapy is offered.   Subjective:     per nursing report patient is doing well.  No acute concern.  Slept well.  Eating well.  Calm cooperative.  Patient on interview reports that today is the 1st time she is not seeing anything.  Denies any visual hallucinations or any auditory hallucinations.   Patient did report of having visual hallucinations yesterday.  Patient denied any suicidal homicidal thoughts.  Patient was also seen by hospitalist team   Dr. Jeane Miguel.  Appreciate his recommendation.  Ordered echo, x-ray chest, Doppler.  X-ray chest, Doppler negative.  Echo pending. Patient's gait and increase to 1.8 from 1.6.  Also will order free T4 given TSH elevated.  Patient reports symptomatic improvement with Anusol.  Reports that she had hemorrhoid surgery in the past.    Sleep: Fair  Appetite:  Fair  Past Psychiatric History: see h&P Family History:  Family History  Problem Relation Age of Onset   Heart Problems Mother    Seizures Mother    Stroke Mother    Diverticulitis Mother    Alcohol abuse Father    Heart attack Father    Heart attack Sister    Pancreatitis Sister    Cancer Sister     Cancer Brother    Social History:  Social History   Substance and Sexual Activity  Alcohol Use No   Alcohol/week: 0.0 standard drinks of alcohol     Social History   Substance and Sexual Activity  Drug Use No    Social History   Socioeconomic History   Marital status: Widowed    Spouse name: Not on file   Number of children: 2   Years of education: Not on file   Highest education level: 6th grade  Occupational History   Not on file  Tobacco Use   Smoking status: Former   Smokeless tobacco: Never   Tobacco comments:    quit 1964  Vaping Use   Vaping status: Never Used  Substance and Sexual Activity   Alcohol use: No    Alcohol/week: 0.0 standard drinks of alcohol   Drug use: No   Sexual activity: Not Currently  Other Topics Concern   Not on file  Social History Narrative   Lives at home alone. Son lives in [place].   Social Drivers of Corporate investment banker Strain: Low Risk  (08/05/2022)   Received from [facility]   Overall Financial Resource Strain (CARDIA)    Difficulty of Paying Living Expenses: Not hard at all  Food Insecurity: No Food Insecurity (10/28/2023)   Hunger Vital Sign    Worried About Running Out of Food in the Last Year: Never true    Ran Out of Food  in the Last Year: Never true  Transportation Needs: No Transportation Needs (10/28/2023)   PRAPARE - Administrator, Civil Service (Medical): No    Lack of Transportation (Non-Medical): No  Physical Activity: Not on file  Stress: Not on file  Social Connections: Socially Isolated (10/28/2023)   Social Connection and Isolation Panel    Frequency of Communication with Friends and Family: More than three times a week    Frequency of Social Gatherings with Friends and Family: More than three times a week    Attends Religious Services: Never    Database administrator or Organizations: No    Attends Banker Meetings: Never    Marital Status: Widowed    Past Medical History:  Past Medical History:  Diagnosis Date   Arthritis    Bipolar 1 disorder (HCC)    Cataracts, bilateral    Chronic kidney disease    stage 3   Chronic kidney disease    Chronic mitral valve disease    Coronary artery disease    Deviated nasal septum    Erosive esophagitis    Erosive esophagitis    Esophageal stricture    Fibroid tumor    GERD (gastroesophageal reflux disease)    History of hiatal hernia    Hypertension    Hypothyroidism    Murmur, cardiac    Nephrogenic diabetes insipidus (HCC)    Pure hypercholesterolemia    Renal cyst, left    bilateral   Seasonal allergies     Past Surgical History:  Procedure Laterality Date   CARDIAC CATHETERIZATION     CHOLECYSTECTOMY     COLONOSCOPY WITH PROPOFOL  N/A 06/01/2018   Procedure: COLONOSCOPY WITH PROPOFOL ;  Surgeon: Deveron Fly, MD;  Location: [facility] ENDOSCOPY;  Service: Endoscopy;  Laterality: N/A;   ESOPHAGEAL DILATION     ESOPHAGOGASTRODUODENOSCOPY (EGD) WITH PROPOFOL  N/A 10/20/2014   Procedure: ESOPHAGOGASTRODUODENOSCOPY (EGD) WITH PROPOFOL ;  Surgeon: Deveron Fly, MD;  Location: [facility] ENDOSCOPY;  Service: Endoscopy;  Laterality: N/A;   ESOPHAGOGASTRODUODENOSCOPY (EGD) WITH PROPOFOL  N/A 06/01/2018   Procedure: ESOPHAGOGASTRODUODENOSCOPY (EGD) WITH PROPOFOL ;  Surgeon: Deveron Fly, MD;  Location: [facility] ENDOSCOPY;  Service: Endoscopy;  Laterality: N/A;   INFECTED SKIN DEBRIDEMENT     on back several years ago   JOINT REPLACEMENT     KNEE ARTHROPLASTY Left 06/10/2023   Procedure: COMPUTER ASSISTED TOTAL KNEE ARTHROPLASTY;  Surgeon: Arlyne Lame, MD;  Location: [facility] ORS;  Service: Orthopedics;  Laterality: Left;   knee replacement, right     LAPAROSCOPIC APPENDECTOMY N/A 06/21/2018   Procedure: APPENDECTOMY LAPAROSCOPIC;  Surgeon: Eldred Grego, MD;  Location: [facility] ORS;  Service: General;  Laterality: N/A;   NOSE SURGERY     THYROIDECTOMY     TOTAL THYROIDECTOMY       Current Medications: Current Facility-Administered Medications  Medication Dose Route Frequency Provider Last Rate Last Admin   acetaminophen  (TYLENOL ) tablet 650 mg  650 mg Oral Q6H PRN Penn, Cicely, NP   650 mg at 11/02/23 1441   alum & mag hydroxide-simeth (MAALOX/MYLANTA) 200-200-20 MG/5ML suspension 30 mL  30 mL Oral Q4H PRN Penn, Cicely, NP   30 mL at 11/02/23 0523   cephALEXin  (KEFLEX ) capsule 500 mg  500 mg Oral Q12H Penn, Cicely, NP   500 mg at 11/02/23 1010   diphenhydrAMINE -zinc acetate (BENADRYL ) 2-0.1 % cream   Topical TID PRN Arieonna Medine, MD   1 Application at 11/01/23 0941   furosemide  (LASIX ) tablet 40 mg  40 mg Oral BID Amin, Sumayya, MD   40 mg at 11/02/23 1802   hydrocortisone (ANUSOL-HC) 2.5 % rectal cream   Rectal QID Haylen Shelnutt, MD   Given at 11/02/23 1819   lamoTRIgine  (LAMICTAL ) tablet 100 mg  100 mg Oral QHS Jadapalle, Sree, MD   100 mg at 11/01/23 2108   lamoTRIgine  (LAMICTAL ) tablet 25 mg  25 mg Oral Daily Jadapalle, Sree, MD   25 mg at 11/02/23 1010   latanoprost  (XALATAN ) 0.005 % ophthalmic solution 1 drop  1 drop Both Eyes QHS Aurelia Blotter, MD   1 drop at 11/01/23 2110   [START ON 11/03/2023] levothyroxine  (SYNTHROID ) tablet 112 mcg  112 mcg Oral Q0600 Amin, Sumayya, MD       losartan  (COZAAR ) tablet 25 mg  25 mg Oral Daily Jadapalle, Sree, MD   25 mg at 11/02/23 1010   OLANZapine  (ZYPREXA ) injection 5 mg  5 mg Intramuscular TID PRN Monroe Antigua, NP       OLANZapine  zydis (ZYPREXA ) disintegrating tablet 5 mg  5 mg Oral TID PRN Monroe Antigua, NP       pantoprazole  (PROTONIX ) EC tablet 40 mg  40 mg Oral Daily Jadapalle, Sree, MD   40 mg at 11/02/23 1010   pravastatin  (PRAVACHOL ) tablet 10 mg  10 mg Oral q1800 Jadapalle, Sree, MD   10 mg at 11/02/23 1802   QUEtiapine  (SEROQUEL ) tablet 100 mg  100 mg Oral QHS Kiaira Pointer, MD   100 mg at 11/01/23 2109   QUEtiapine  (SEROQUEL ) tablet 25 mg  25 mg Oral Q breakfast Penn, Alaine Howells, NP   25 mg  at 11/02/23 1010   traZODone  (DESYREL ) tablet 50 mg  50 mg Oral QHS PRN Monroe Antigua, NP   50 mg at 11/01/23 2109    Lab Results:  Results for orders placed or performed during the hospital encounter of 10/28/23 (from the past 48 hours)  Urinalysis, Complete w Microscopic -Urine, Random     Status: Abnormal   Collection Time: 11/01/23  1:24 PM  Result Value Ref Range   Color, Urine COLORLESS (A) YELLOW   APPearance CLEAR (A) CLEAR   Specific Gravity, Urine 1.004 (L) 1.005 - 1.030   pH 5.0 5.0 - 8.0   Glucose, UA NEGATIVE NEGATIVE mg/dL   Hgb urine dipstick MODERATE (A) NEGATIVE   Bilirubin Urine NEGATIVE NEGATIVE   Ketones, ur NEGATIVE NEGATIVE mg/dL   Protein, ur NEGATIVE NEGATIVE mg/dL   Nitrite NEGATIVE NEGATIVE   Leukocytes,Ua NEGATIVE NEGATIVE   RBC / HPF 0-5 0 - 5 RBC/hpf   WBC, UA 0-5 0 - 5 WBC/hpf   Bacteria, UA NONE SEEN NONE SEEN   Squamous Epithelial / HPF 0-5 0 - 5 /HPF    Comment: Performed at [facility], [address] [place], [place] [address]  T4, free     Status: None   Collection Time: 11/02/23  6:51 AM  Result Value Ref Range   Free T4 0.74 0.61 - 1.12 ng/dL    Comment: (NOTE) Biotin ingestion may interfere with free T4 tests. If the results are inconsistent with the TSH level, previous test results, or the clinical presentation, then consider biotin interference. If needed, order repeat testing after stopping biotin. Performed at [facility], [address] [place], [place] [address]   Basic metabolic panel     Status: Abnormal   Collection Time: 11/02/23  6:55 AM  Result Value Ref Range   Sodium 137 135 - 145 mmol/L  Potassium 3.9 3.5 - 5.1 mmol/L   Chloride 103 98 - 111 mmol/L   CO2 22 22 - 32 mmol/L   Glucose, Bld 130 (H) 70 - 99 mg/dL    Comment: Glucose reference range applies only to samples taken after fasting for at least 8 hours.   BUN 43 (H) 8 - 23 mg/dL   Creatinine, Ser 1.61 (H) 0.44 - 1.00 mg/dL   Calcium 8.7  (L) 8.9 - 10.3 mg/dL   GFR, Estimated 26 (L) >60 mL/min    Comment: (NOTE) Calculated using the CKD-EPI Creatinine Equation (2021)    Anion gap 12 5 - 15    Comment: Performed at [facility], [address] [place], [place] [address]  TSH     Status: Abnormal   Collection Time: 11/02/23  6:55 AM  Result Value Ref Range   TSH 9.363 (H) 0.350 - 4.500 uIU/mL    Comment: Performed by a 3rd Generation assay with a functional sensitivity of <=0.01 uIU/mL. Performed at [facility], [address] [place], [place] [address]   Brain natriuretic peptide     Status: None   Collection Time: 11/02/23  6:55 AM  Result Value Ref Range   B Natriuretic Peptide 67.9 0.0 - 100.0 pg/mL    Comment: Performed at [facility], [address] [place], [place] [address]    Blood Alcohol level:  Lab Results  Component Value Date   [facility] <15 10/27/2023   ETH <5 07/07/2015    Metabolic Disorder Labs: Lab Results  Component Value Date   HGBA1C 5.8 (H) 10/27/2023   MPG 119.76 10/27/2023   No results found for: PROLACTIN No results found for: CHOL, TRIG, HDL, CHOLHDL, VLDL, LDLCALC  Physical Findings: AIMS:  , ,  ,  ,    CIWA:    COWS:      Psychiatric Specialty Exam:  Presentation  General Appearance:  Appropriate for Environment  Eye Contact: Fair  Speech: Pressured  Speech Volume: Increased    Mood and Affect  Mood: Euphoric  Affect: Congruent   Thought Process  Thought Processes: Coherent; Goal Directed  Descriptions of Associations:Intact  Orientation:Partial  Thought Content:Illusions  Hallucinations:No data recorded   Ideas of Reference:None  Suicidal Thoughts:No data recorded   Homicidal Thoughts:No data recorded    Sensorium  Memory: Immediate Fair; Recent Fair; Remote Fair  Judgment: Fair  Insight: Fair   Art therapist  Concentration: Fair  Attention  Span: Fair  Recall: [employer] of Knowledge: Fair  Language: Fair   Psychomotor Activity  Psychomotor Activity: No data recorded   Musculoskeletal: Strength & Muscle Tone: within normal limits Gait & Station: unsteady Assets  Assets: Financial Resources/Insurance; Desire for Improvement; Communication Skills; Social Support    Physical Exam: Physical Exam Vitals and nursing note reviewed.  HENT:     Head: Normocephalic.   Neurological:     Mental Status: She is alert.    Review of Systems  Constitutional: Negative.   HENT: Negative.    Eyes: Negative.   Skin: Negative.    Blood pressure (!) 142/58, pulse 80, temperature (!) 97.3 F (36.3 C), resp. rate 18, height 5' 7 (1.702 m), weight 80.3 kg, SpO2 99%. Body mass index is 27.72 kg/m.  Diagnosis: Principal Problem:   Bipolar disorder (HCC) Active Problems:   Hypothyroidism   CKD (chronic kidney disease), stage IV (HCC)   Hypertension   Peripheral edema   PLAN: Safety and Monitoring:  -- Voluntary admission  to inpatient psychiatric unit for safety, stabilization and treatment  -- Daily contact with patient to assess and evaluate symptoms and progress in treatment  -- Patient's case to be discussed in multi-disciplinary team meeting  -- Observation Level : q15 minute checks  -- Vital signs:  q12 hours  -- Precautions: suicide, elopement, and assault -- Encouraged patient to participate in unit milieu and in scheduled group therapies  2. Psychiatric Diagnoses and Treatment:   Continue Seroquel  25 mg in the morning and continue 100 mg at bedtime. Continue lamictal  to 25 mg QAM and 100mg  QHS     3. Medical Issues Being Addressed:    Appreciate hospital recommendation.  Continue Anusol.   Continue Lasix .  Monitor electrolytes.    Will ask the staff to give compression stockings if behavioral health rules permits.  Echo pending.   Free T4 pending.     4. Discharge Planning:   -- Social work  and case management to assist with discharge planning and identification of hospital follow-up needs prior to discharge  -- Estimated LOS: 3-4 days  Albert Huff, MD 11/02/2023, 6:55 PM

## 2023-11-02 NOTE — Assessment & Plan Note (Signed)
 Elevated TSH at 9.363 with low normal free T4. - Increasing the dose of Synthroid  to 112 mcg -Patient will need a repeat test in about 4 weeks by PCP

## 2023-11-02 NOTE — Group Note (Signed)
 Recreation Therapy Group Note   Group Topic:Other  Group Date: 11/02/2023 Start Time: 1400 End Time: 1445 Facilitators: Deatrice Factor, LRT, CTRS Location: Courtyard  Group Description: Music Reminisce. LRT encouraged patients to think of their favorite song(s) that reminded them of a positive memory or time in their life. LRT encouraged patient to talk about that memory aloud to the group. LRT played the song through a speaker for all to hear. LRT and patients discussed how thinking of a positive memory or time in their life can be used as a coping skill in everyday life post discharge.   Goal Area(s) Addressed: Patient will increase verbal communication by conversing with peers. Patient will contribute to group discussion with minimal prompting. Patient will reminisce a positive memory or moment in their life.    Affect/Mood: Appropriate   Participation Level: Active and Engaged   Participation Quality: Independent   Behavior: Appropriate, Calm, and Cooperative   Speech/Thought Process: Coherent   Insight: Good   Judgement: Good   Modes of Intervention: Guided Discussion and Music   Patient Response to Interventions:  Attentive, Engaged, Interested , and Receptive   Education Outcome:  Acknowledges education   Clinical Observations/Individualized Feedback: Meera was active in their participation of session activities and group discussion. Pt identified that she loves music and dancing. Pt shared she likes all types of music except for opera. Pt interacted well with LRT and peers duration of session.    Plan: Continue to engage patient in RT group sessions 2-3x/week.   Deatrice Factor, LRT, CTRS 11/02/2023 4:31 PM

## 2023-11-02 NOTE — Plan of Care (Signed)
  Problem: Education: Goal: Utilization of techniques to improve thought processes will improve Outcome: Progressing Goal: Knowledge of the prescribed therapeutic regimen will improve Outcome: Progressing   Problem: Activity: Goal: Interest or engagement in leisure activities will improve Outcome: Progressing Goal: Imbalance in normal sleep/wake cycle will improve Outcome: Progressing   Problem: Coping: Goal: Coping ability will improve Outcome: Progressing Goal: Will verbalize feelings Outcome: Progressing   Problem: Health Behavior/Discharge Planning: Goal: Ability to make decisions will improve Outcome: Progressing Goal: Compliance with therapeutic regimen will improve Outcome: Progressing   Problem: Role Relationship: Goal: Will demonstrate positive changes in social behaviors and relationships Outcome: Progressing   Problem: Safety: Goal: Ability to disclose and discuss suicidal ideas will improve Outcome: Progressing Goal: Ability to identify and utilize support systems that promote safety will improve Outcome: Progressing   Problem: Self-Concept: Goal: Will verbalize positive feelings about self Outcome: Progressing Goal: Level of anxiety will decrease Outcome: Progressing   Problem: Activity: Goal: Will verbalize the importance of balancing activity with adequate rest periods Outcome: Progressing   Problem: Education: Goal: Will be free of psychotic symptoms Outcome: Progressing Goal: Knowledge of the prescribed therapeutic regimen will improve Outcome: Progressing   Problem: Coping: Goal: Coping ability will improve Outcome: Progressing Goal: Will verbalize feelings Outcome: Progressing   Problem: Health Behavior/Discharge Planning: Goal: Compliance with prescribed medication regimen will improve Outcome: Progressing   Problem: Nutritional: Goal: Ability to achieve adequate nutritional intake will improve Outcome: Progressing   Problem: Role  Relationship: Goal: Ability to communicate needs accurately will improve Outcome: Progressing Goal: Ability to interact with others will improve Outcome: Progressing   Problem: Safety: Goal: Ability to redirect hostility and anger into socially appropriate behaviors will improve Outcome: Progressing Goal: Ability to remain free from injury will improve Outcome: Progressing   Problem: Self-Care: Goal: Ability to participate in self-care as condition permits will improve Outcome: Progressing   Problem: Self-Concept: Goal: Will verbalize positive feelings about self Outcome: Progressing

## 2023-11-02 NOTE — Assessment & Plan Note (Signed)
 Renal function currently within baseline but slightly increased creatinine as compared to the admission. -Monitor renal function closely while she is being diuresed -Avoid nephrotoxins

## 2023-11-02 NOTE — Group Note (Signed)
 Date:  11/02/2023 Time:  10:46 AM  Group Topic/Focus:  Fresh air Therapy Outdoors    Participation Level:  Did Not Attend   Merton Abts 11/02/2023, 10:46 AM

## 2023-11-02 NOTE — Assessment & Plan Note (Signed)
 Patient with significant lower extremity edema.  No shortness of breath or orthopnea.  BNP normal.  TSH elevated with low normal free T4. - Continue with p.o. Lasix  - Compression stockings ordered -Raise legs while sitting -Echocardiogram ordered-pending -Monitor renal function while being diuresed

## 2023-11-02 NOTE — Plan of Care (Signed)
  Problem: Coping: Goal: Will verbalize feelings Outcome: Progressing   Problem: Safety: Goal: Ability to disclose and discuss suicidal ideas will improve Outcome: Progressing   Problem: Activity: Goal: Interest or engagement in leisure activities will improve Outcome: Progressing

## 2023-11-02 NOTE — Assessment & Plan Note (Signed)
 Blood pressure currently within goal. -Continuing home losartan  -Monitor closely as we added Lasix 

## 2023-11-02 NOTE — Group Note (Signed)
 Date:  11/02/2023 Time:  8:42 PM  Group Topic/Focus:  Recovery Goals:   The focus of this group is to identify appropriate goals for recovery and establish a plan to achieve them.    Participation Level:  Active  Participation Quality:  Appropriate  Affect:  Appropriate  Cognitive:  Appropriate  Insight: Appropriate  Engagement in Group:  Engaged  Modes of Intervention:  Discussion  Additional Comments:    Stephanie Anthony 11/02/2023, 8:42 PM

## 2023-11-02 NOTE — Progress Notes (Signed)
   11/02/23 1700  Psych Admission Type (Psych Patients Only)  Admission Status Voluntary/72 hour document signed  Psychosocial Assessment  Patient Complaints None  Eye Contact Fair  Facial Expression Animated  Affect Appropriate to circumstance  Speech Logical/coherent  Interaction Assertive  Motor Activity Unsteady  Appearance/Hygiene In scrubs  Behavior Characteristics Cooperative  Mood Anxious  Thought Process  Coherency Flight of ideas  Content WDL  Delusions None reported or observed  Perception WDL  Hallucination None reported or observed  Judgment Impaired  Confusion Mild  Danger to Self  Current suicidal ideation? Denies

## 2023-11-02 NOTE — Progress Notes (Signed)
 Alert and oriented. C/o of lower backache, PRN tylenol  given for pain.  Denies SI/HI.      11/02/23 0500  Psych Admission Type (Psych Patients Only)  Admission Status Voluntary  Psychosocial Assessment  Patient Complaints None  Eye Contact Fair  Facial Expression Animated  Affect Appropriate to circumstance  Speech Logical/coherent  Interaction Assertive  Motor Activity Slow  Appearance/Hygiene In scrubs  Behavior Characteristics Cooperative  Mood Anxious  Thought Process  Coherency Flight of ideas  Content Religiosity  Delusions Religious  Perception Hallucinations  Hallucination Visual  Judgment Impaired  Confusion Mild  Danger to Self  Current suicidal ideation? Denies

## 2023-11-02 NOTE — Hospital Course (Addendum)
 Taken from original consult note.  Stephanie Anthony is a 89 y.o. female with past medical history of CKD stage IV, HTN, bipolar disorder, who was admitted at behavioral health, was found to have increasing lower extremity swelling.  We were consulted for worsening peripheral edema.  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  Patient was started on p.o. Lasix .  6/16: Vital stable, TSH elevated at 9.363-ordered free T4, UA negative.  BMP with slight worsening of creatinine to 1.85 as compared to the admission 6 days ago.  BNP at 67. patient did receive 80 mg of Lasix  yesterday. Lower extremity venous Doppler negative for DVT.  Echocardiogram pending. Significant lower extremity edema so continuing p.o. Lasix  with close monitoring of renal function.  Patient will also get benefit from compression stockings.  6/17: Vital stable, slight increase in creatinine to 1.93, improving lower extremity edema.  Twice daily p.o. Lasix  decreased to once a day.  Echocardiogram with normal EF, grade 1 diastolic dysfunction and no regional wall motion abnormalities.  Encourage keeping lower extremity elevated and compression stockings-Case was discussed with psychiatry  6/18: Vital stable with slowly worsening creatinine now at 2.  Lower extremity edema improving so discontinuing further p.o. Lasix .  Still no compression stockings so Case was discussed again with nursing staff. Patient should use compression stockings while ambulation and keep the legs elevated at rest. Unfortunately we cannot wrap her legs in a psych unit.  6/19: Hemodynamically stable, creatinine at 2.08, mostly stable.  She did received p.o. Lasix  yesterday before discontinuation.  We will hold further diuresis. Lower extremity edema seems much improved but still 1+.  she was not wearing any compression stockings although ordered 2 days  ago.  Patient is being discharged by primary team today.  She should be wearing compression stockings during the day and keeping her legs elevated while resting.  Patient need to follow-up with her primary care provider for further assistance.

## 2023-11-02 NOTE — Assessment & Plan Note (Signed)
-   As per primary team at behavioral health

## 2023-11-03 ENCOUNTER — Inpatient Hospital Stay
Admit: 2023-11-03 | Discharge: 2023-11-03 | Disposition: A | Discharge status: Home / Self Care | Attending: Internal Medicine | Admitting: Internal Medicine

## 2023-11-03 LAB — ECHOCARDIOGRAM COMPLETE
AR max vel: 1.15 cm2
AV Area VTI: 1.03 cm2
AV Area mean vel: 1.09 cm2
AV Mean grad: 11.5 mmHg
AV Peak grad: 20.5 mmHg
Ao pk vel: 2.27 m/s
Area-P 1/2: 3.6 cm2
Height: 67 in
P 1/2 time: 279 ms
S' Lateral: 3 cm
Weight: 2832 [oz_av]

## 2023-11-03 LAB — RENAL FUNCTION PANEL
Albumin: 3.5 g/dL (ref 3.5–5.0)
Anion gap: 10 (ref 5–15)
BUN: 45 mg/dL — ABNORMAL HIGH (ref 8–23)
CO2: 29 mmol/L (ref 22–32)
Calcium: 9.2 mg/dL (ref 8.9–10.3)
Chloride: 102 mmol/L (ref 98–111)
Creatinine, Ser: 1.92 mg/dL — ABNORMAL HIGH (ref 0.44–1.00)
GFR, Estimated: 25 mL/min — ABNORMAL LOW (ref >60)
Glucose, Bld: 108 mg/dL — ABNORMAL HIGH (ref 70–99)
Phosphorus: 5.1 mg/dL — ABNORMAL HIGH (ref 2.5–4.6)
Potassium: 4 mmol/L (ref 3.5–5.1)
Sodium: 141 mmol/L (ref 135–145)

## 2023-11-03 MED ORDER — FUROSEMIDE 20 MG PO TABS
40.0000 mg | ORAL_TABLET | Freq: Every day | ORAL | Status: DC
  Administered 2023-11-04: 40 mg via ORAL
  Filled 2023-11-03: qty 2

## 2023-11-03 NOTE — Group Note (Signed)
 LCSW Group Therapy Note  Group Date: 11/03/2023 Start Time: 1330 End Time: 1400   Type of Therapy and Topic:  Group Therapy - Healthy vs Unhealthy Coping Skills  Participation Level:  Active   Description of Group The focus of this group was to determine what unhealthy coping techniques typically are used by group members and what healthy coping techniques would be helpful in coping with various problems. Patients were guided in becoming aware of the differences between healthy and unhealthy coping techniques. Patients were asked to identify 2-3 healthy coping skills they would like to learn to use more effectively.  Therapeutic Goals Patients learned that coping is what human beings do all day long to deal with various situations in their lives Patients defined and discussed healthy vs unhealthy coping techniques Patients identified their preferred coping techniques and identified whether these were healthy or unhealthy Patients determined 2-3 healthy coping skills they would like to become more familiar with and use more often. Patients provided support and ideas to each other   Summary of Patient Progress:  During group, Skyah expressed being depressed in the past and not getting off of her couch. Patient proved open to input from peers and feedback from CSW. Patient demonstrated fair insight into the subject matter, was respectful of peers, and participated throughout the entire session.   Therapeutic Modalities Cognitive Behavioral Therapy Motivational Interviewing  Claudio Culver, [place] 11/03/2023  2:50 PM

## 2023-11-03 NOTE — Progress Notes (Signed)
 Echocardiogram 2D Echocardiogram has been performed.  Stephanie Anthony 11/03/2023, 9:46 AM

## 2023-11-03 NOTE — Progress Notes (Signed)
   11/02/23 1949  Psych Admission Type (Psych Patients Only)  Admission Status Voluntary  Psychosocial Assessment  Patient Complaints None  Eye Contact Fair  Facial Expression Animated  Affect Appropriate to circumstance  Speech Logical/coherent  Interaction Assertive  Motor Activity Unsteady  Appearance/Hygiene Unremarkable  Behavior Characteristics Cooperative;Appropriate to situation;Calm  Mood Pleasant  Aggressive Behavior  Effect No apparent injury  Thought Chartered certified accountant of ideas  Content WDL  Delusions None reported or observed  Perception WDL  Hallucination None reported or observed  Judgment Impaired  Confusion Mild  Danger to Self  Current suicidal ideation? Denies  Agreement Not to Harm Self Yes  Description of Agreement Verbal  Danger to Others  Danger to Others None reported or observed

## 2023-11-03 NOTE — Progress Notes (Signed)
 Consultation Progress Note   Patient: Stephanie Anthony JXB:[id number] DOB: Jan 12, 1934 DOA: 10/28/2023 DOS: the patient was seen and examined on 11/03/2023 Primary service: Aurelia Blotter, MD  Brief hospital course: Taken from original consult note.  Stephanie Anthony is a 89 y.o. female with past medical history of CKD stage IV, HTN, bipolar disorder, who was admitted at behavioral health, was found to have increasing lower extremity swelling.  We were consulted for worsening peripheral edema.  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  Patient was started on p.o. Lasix .  6/16: Vital stable, TSH elevated at 9.363-ordered free T4, UA negative.  BMP with slight worsening of creatinine to 1.85 as compared to the admission 6 days ago.  BNP at 67. patient did receive 80 mg of Lasix  yesterday. Lower extremity venous Doppler negative for DVT.  Echocardiogram pending. Significant lower extremity edema so continuing p.o. Lasix  with close monitoring of renal function.  Patient will also get benefit from compression stockings.  6/17: Vital stable, slight increase in creatinine to 1.93, improving lower extremity edema.  Twice daily p.o. Lasix  decreased to once a day.  Echocardiogram with normal EF, grade 1 diastolic dysfunction and no regional wall motion abnormalities.  Encourage keeping lower extremity elevated and compression stockings-Case was discussed with psychiatry  Assessment and Plan: * Bipolar disorder (HCC) - As per primary team at behavioral health  Peripheral edema Patient with significant lower extremity edema.  No shortness of breath or orthopnea.  BNP normal.  TSH elevated with low normal free T4.  Echocardiogram with normal EF, grade 1 diastolic dysfunction - Continue with p.o. Lasix -dose decreased to daily - Compression stockings ordered -Raise legs while sitting -Monitor renal  function while being diuresed  Hypertension Blood pressure currently within goal. -Continuing home losartan  -Monitor closely as we added Lasix   CKD (chronic kidney disease), stage IV (HCC) Renal function currently within baseline but slightly increased creatinine  -Monitor renal function closely while she is being diuresed -Avoid nephrotoxins  Hypothyroidism Elevated TSH at 9.363 with low normal free T4. - Increasing the dose of Synthroid  to 112 mcg -Patient will need a repeat test in about 4 weeks by PCP   [facility] will continue to follow the patient.  Subjective: Patient was playing cards at behavior [facility] when seen today.  No chest pain or shortness of breath.  Physical Exam: Vitals:   11/02/23 0709 11/02/23 1807 11/02/23 1918 11/03/23 0711  BP: (!) 100/37 (!) 142/58 123/60 (!) 112/95  Pulse: 73 80 80 83  Resp: 18  16 20   Temp: (!) 97.3 F (36.3 C)  (!) 97.3 F (36.3 C) (!) 97.5 F (36.4 C)  TempSrc:      SpO2: 99%  99% 99%  Weight:      Height:       General.  Frail elderly lady, in no acute distress. Pulmonary.  Lungs clear bilaterally, normal respiratory effort. CV.  Regular rate and rhythm, no JVD, rub or murmur. Abdomen.  Soft, nontender, nondistended, BS positive. CNS.  Alert and oriented .  No focal neurologic deficit. Extremities.  2+ LE edema,  pulses intact and symmetrical. Edema seems improving as compared to before  Data Reviewed: Prior data reviewed  Family Communication: Primary team is communicating with family  Time spent: 44 minutes.  This record has been created using Conservation officer, historic buildings. Errors have been sought and corrected,but may not always be located. Such  creation errors do not reflect on the standard of care.   Author: Luna Salinas, MD 11/03/2023 3:30 PM  For on call review [email or web address].

## 2023-11-03 NOTE — Progress Notes (Signed)
 [facility] MD Progress Note  11/03/2023 1:43 PM Stephanie Anthony  MRN:  [id number]  Patient is a 89 y/o WF presents to ED with worsening hallucinations, mania.  She reports her psychiatrist told her to stop taking her aripiprazole  recently due to kidney function concerns.  However, review of medical records indicates this medication was  discontinued 09/29/22 due to TD and Parkinson symptoms.  The initial plan documented was to increase lamotrigine  if needed; however, appears she has been fairly stable on lamotrigine  current dose since discontinue of aripiprazole  until recently; she is noted to have a UTI which most likely has contributed to recent mania episode. Patient is admitted to [facility] unit with Q15 min safety monitoring. Multidisciplinary team approach is offered. Medication management; group/milieu therapy is offered.   Subjective:    Per nursing report patient is doing well.  No concerns reported.  Patient on interview reports that she is worried about her leg edema.  Denies any current visual hallucinations  Consecutively for 2 days.  Patient denies any auditory hallucinations or any paranoia.  Denies any suicidal homicidal ideations.  Patient reported that she is sleeping well.  Eating well.  No agitation no aggression noted.  Attending groups.  Appreciate hospitalist's consult and recommendation.   Ejection fraction on echo was normal and showed grade 1 diastolic dysfunction.  Creatinine increased to 1.92.  TSH increase but free T4 within normal range.   Sleep: Fair  Appetite:  Fair  Past Psychiatric History: see h&P Family History:  Family History  Problem Relation Age of Onset   Heart Problems Mother    Seizures Mother    Stroke Mother    Diverticulitis Mother    Alcohol abuse Father    Heart attack Father    Heart attack Sister    Pancreatitis Sister    Cancer Sister    Cancer Brother    Social History:  Social History   Substance and Sexual Activity  Alcohol Use No    Alcohol/week: 0.0 standard drinks of alcohol     Social History   Substance and Sexual Activity  Drug Use No    Social History   Socioeconomic History   Marital status: Widowed    Spouse name: Not on file   Number of children: 2   Years of education: Not on file   Highest education level: 6th grade  Occupational History   Not on file  Tobacco Use   Smoking status: Former   Smokeless tobacco: Never   Tobacco comments:    quit 1964  Vaping Use   Vaping status: Never Used  Substance and Sexual Activity   Alcohol use: No    Alcohol/week: 0.0 standard drinks of alcohol   Drug use: No   Sexual activity: Not Currently  Other Topics Concern   Not on file  Social History Narrative   Lives at home alone. Son lives in [place].   Social Drivers of Corporate investment banker Strain: Low Risk  (08/05/2022)   Received from [facility]   Overall Financial Resource Strain (CARDIA)    Difficulty of Paying Living Expenses: Not hard at all  Food Insecurity: No Food Insecurity (10/28/2023)   Hunger Vital Sign    Worried About Running Out of Food in the Last Year: Never true    Ran Out of Food in the Last Year: Never true  Transportation Needs: No Transportation Needs (10/28/2023)   PRAPARE - Administrator, Civil Service (  Medical): No    Lack of Transportation (Non-Medical): No  Physical Activity: Not on file  Stress: Not on file  Social Connections: Socially Isolated (10/28/2023)   Social Connection and Isolation Panel    Frequency of Communication with Friends and Family: More than three times a week    Frequency of Social Gatherings with Friends and Family: More than three times a week    Attends Religious Services: Never    Database administrator or Organizations: No    Attends Banker Meetings: Never    Marital Status: Widowed   Past Medical History:  Past Medical History:  Diagnosis Date   Arthritis    Bipolar 1 disorder  (HCC)    Cataracts, bilateral    Chronic kidney disease    stage 3   Chronic kidney disease    Chronic mitral valve disease    Coronary artery disease    Deviated nasal septum    Erosive esophagitis    Erosive esophagitis    Esophageal stricture    Fibroid tumor    GERD (gastroesophageal reflux disease)    History of hiatal hernia    Hypertension    Hypothyroidism    Murmur, cardiac    Nephrogenic diabetes insipidus (HCC)    Pure hypercholesterolemia    Renal cyst, left    bilateral   Seasonal allergies     Past Surgical History:  Procedure Laterality Date   CARDIAC CATHETERIZATION     CHOLECYSTECTOMY     COLONOSCOPY WITH PROPOFOL  N/A 06/01/2018   Procedure: COLONOSCOPY WITH PROPOFOL ;  Surgeon: Deveron Fly, MD;  Location: [facility] ENDOSCOPY;  Service: Endoscopy;  Laterality: N/A;   ESOPHAGEAL DILATION     ESOPHAGOGASTRODUODENOSCOPY (EGD) WITH PROPOFOL  N/A 10/20/2014   Procedure: ESOPHAGOGASTRODUODENOSCOPY (EGD) WITH PROPOFOL ;  Surgeon: Deveron Fly, MD;  Location[facility] ENDOSCOPY;  Service: Endoscopy;  Laterality: N/A;   ESOPHAGOGASTRODUODENOSCOPY (EGD) WITH PROPOFOL  N/A 06/01/2018   Procedure: ESOPHAGOGASTRODUODENOSCOPY (EGD) WITH PROPOFOL ;  Surgeon: Deveron Fly, MD;  Location: [facility] ENDOSCOPY;  Service: Endoscopy;  Laterality: N/A;   INFECTED SKIN DEBRIDEMENT     on back several years ago   JOINT REPLACEMENT     KNEE ARTHROPLASTY Left 06/10/2023   Procedure: COMPUTER ASSISTED TOTAL KNEE ARTHROPLASTY;  Surgeon: Arlyne Lame, MD;  Location: [facility] ORS;  Service: Orthopedics;  Laterality: Left;   knee replacement, right     LAPAROSCOPIC APPENDECTOMY N/A 06/21/2018   Procedure: APPENDECTOMY LAPAROSCOPIC;  Surgeon: Eldred Grego, MD;  Location: [facility] ORS;  Service: General;  Laterality: N/A;   NOSE SURGERY     THYROIDECTOMY     TOTAL THYROIDECTOMY      Current Medications: Current Facility-Administered Medications  Medication Dose Route Frequency Provider  Last Rate Last Admin   acetaminophen  (TYLENOL ) tablet 650 mg  650 mg Oral Q6H PRN Penn, Cicely, NP   650 mg at 11/02/23 2124   alum & mag hydroxide-simeth (MAALOX/MYLANTA) 200-200-20 MG/5ML suspension 30 mL  30 mL Oral Q4H PRN Penn, Alaine Howells, NP   30 mL at 11/03/23 0316   diphenhydrAMINE -zinc acetate (BENADRYL ) 2-0.1 % cream   Topical TID PRN Kharson Rasmusson, MD   1 Application at 11/01/23 0941   [START ON 11/04/2023] furosemide  (LASIX ) tablet 40 mg  40 mg Oral Daily Amin, Sumayya, MD       hydrocortisone (ANUSOL-HC) 2.5 % rectal cream   Rectal QID Kamal Jurgens, MD   Given at 11/03/23 1035   lamoTRIgine  (LAMICTAL ) tablet 100 mg  100 mg  Oral QHS Jadapalle, Sree, MD   100 mg at 11/02/23 2123   lamoTRIgine  (LAMICTAL ) tablet 25 mg  25 mg Oral Daily Jadapalle, Sree, MD   25 mg at 11/03/23 1029   latanoprost  (XALATAN ) 0.005 % ophthalmic solution 1 drop  1 drop Both Eyes QHS Jadapalle, Sree, MD   1 drop at 11/02/23 2128   levothyroxine  (SYNTHROID ) tablet 112 mcg  112 mcg Oral Q0600 Amin, Sumayya, MD   112 mcg at 11/03/23 1610   losartan  (COZAAR ) tablet 25 mg  25 mg Oral Daily Jadapalle, Sree, MD   25 mg at 11/03/23 1029   OLANZapine  (ZYPREXA ) injection 5 mg  5 mg Intramuscular TID PRN Monroe Antigua, NP       OLANZapine  zydis (ZYPREXA ) disintegrating tablet 5 mg  5 mg Oral TID PRN Monroe Antigua, NP       pantoprazole  (PROTONIX ) EC tablet 40 mg  40 mg Oral Daily Jadapalle, Sree, MD   40 mg at 11/03/23 1029   pravastatin  (PRAVACHOL ) tablet 10 mg  10 mg Oral q1800 Jadapalle, Sree, MD   10 mg at 11/02/23 1802   QUEtiapine  (SEROQUEL ) tablet 100 mg  100 mg Oral QHS Kelbie Moro, MD   100 mg at 11/02/23 2124   QUEtiapine  (SEROQUEL ) tablet 25 mg  25 mg Oral Q breakfast Penn, Alaine Howells, NP   25 mg at 11/03/23 1029   traZODone  (DESYREL ) tablet 50 mg  50 mg Oral QHS PRN Monroe Antigua, NP   50 mg at 11/01/23 2109    Lab Results:  Results for orders placed or performed during the hospital encounter of  10/28/23 (from the past 48 hours)  T4, free     Status: None   Collection Time: 11/02/23  6:51 AM  Result Value Ref Range   Free T4 0.74 0.61 - 1.12 ng/dL    Comment: (NOTE) Biotin ingestion may interfere with free T4 tests. If the results are inconsistent with the TSH level, previous test results, or the clinical presentation, then consider biotin interference. If needed, order repeat testing after stopping biotin. Performed at  Hospital, [address] [place], [place] [address]   Basic metabolic panel     Status: Abnormal   Collection Time: 11/02/23  6:55 AM  Result Value Ref Range   Sodium 137 135 - 145 mmol/L   Potassium 3.9 3.5 - 5.1 mmol/L   Chloride 103 98 - 111 mmol/L   CO2 22 22 - 32 mmol/L   Glucose, Bld 130 (H) 70 - 99 mg/dL    Comment: Glucose reference range applies only to samples taken after fasting for at least 8 hours.   BUN 43 (H) 8 - 23 mg/dL   Creatinine, Ser 4.09 (H) 0.44 - 1.00 mg/dL   Calcium 8.7 (L) 8.9 - 10.3 mg/dL   GFR, Estimated 26 (L) >60 mL/min    Comment: (NOTE) Calculated using the CKD-EPI Creatinine Equation (2021)    Anion gap 12 5 - 15    Comment: Performed at [facility], [address] [place], [place] [address]  TSH     Status: Abnormal   Collection Time: 11/02/23  6:55 AM  Result Value Ref Range   TSH 9.363 (H) 0.350 - 4.500 uIU/mL    Comment: Performed by a 3rd Generation assay with a functional sensitivity of <=0.01 uIU/mL. Performed at [facility], [address] [place], [place] [address]   Brain natriuretic peptide     Status: None   Collection Time: 11/02/23  6:55 AM  Result Value Ref Range   B Natriuretic Peptide 67.9 0.0 - 100.0 pg/mL    Comment: Performed at [facility], [address] [place], [place] [address]  Renal function panel     Status: Abnormal   Collection Time: 11/03/23  6:55 AM  Result Value Ref Range   Sodium 141 135 - 145 mmol/L   Potassium 4.0 3.5 - 5.1  mmol/L   Chloride 102 98 - 111 mmol/L   CO2 29 22 - 32 mmol/L   Glucose, Bld 108 (H) 70 - 99 mg/dL    Comment: Glucose reference range applies only to samples taken after fasting for at least 8 hours.   BUN 45 (H) 8 - 23 mg/dL   Creatinine, Ser 4.13 (H) 0.44 - 1.00 mg/dL   Calcium 9.2 8.9 - 24.4 mg/dL   Phosphorus 5.1 (H) 2.5 - 4.6 mg/dL   Albumin 3.5 3.5 - 5.0 g/dL   GFR, Estimated 25 (L) >60 mL/min    Comment: (NOTE) Calculated using the CKD-EPI Creatinine Equation (2021)    Anion gap 10 5 - 15    Comment: Performed at [facility], [address] [place], [place] [address]    Blood Alcohol level:  Lab Results  Component Value Date   [facility] <15 10/27/2023   ETH <5 07/07/2015    Metabolic Disorder Labs: Lab Results  Component Value Date   HGBA1C 5.8 (H) 10/27/2023   MPG 119.76 10/27/2023   No results found for: PROLACTIN No results found for: CHOL, TRIG, HDL, CHOLHDL, VLDL, LDLCALC  Physical Findings: AIMS:  , ,  ,  ,    CIWA:    COWS:      Psychiatric Specialty Exam:  Presentation  General Appearance:  Appropriate for Environment  Eye Contact: Fair  Speech: Pressured  Speech Volume: Increased    Mood and Affect  Mood: Euphoric  Affect: Congruent   Thought Process  Thought Processes: Coherent; Goal Directed  Descriptions of Associations:Intact  Orientation:Partial  Thought Content:Illusions  Hallucinations: none reported   Ideas of Reference:None  Suicidal Thoughts:No data recorded   Homicidal Thoughts:No data recorded    Sensorium  Memory: Immediate Fair; Recent Fair; Remote Fair  Judgment: Fair  Insight: Fair   Art therapist  Concentration: Fair  Attention Span: Fair  Recall: [employer] of Knowledge: Fair  Language: Fair   Psychomotor Activity  Psychomotor Activity: No data recorded   Musculoskeletal: Strength & Muscle Tone: within normal limits Gait & Station:  unsteady Assets  Assets: Financial Resources/Insurance; Desire for Improvement; Communication Skills; Social Support    Physical Exam: Physical Exam Vitals and nursing note reviewed.  HENT:     Head: Normocephalic.   Neurological:     Mental Status: She is alert.    Review of Systems  Constitutional: Negative.   HENT: Negative.    Eyes: Negative.   Skin: Negative.    Blood pressure (!) 112/95, pulse 83, temperature (!) 97.5 F (36.4 C), resp. rate 20, height 5' 7 (1.702 m), weight 80.3 kg, SpO2 99%. Body mass index is 27.72 kg/m.  Diagnosis: Principal Problem:   Bipolar disorder (HCC) Active Problems:   Hypothyroidism   CKD (chronic kidney disease), stage IV (HCC)   Hypertension   Peripheral edema   PLAN: Safety and Monitoring:  -- Voluntary admission to inpatient psychiatric unit for safety, stabilization and treatment  -- Daily contact with patient to assess and evaluate symptoms and progress in treatment  -- Patient's  case to be discussed in multi-disciplinary team meeting  -- Observation Level : q15 minute checks  -- Vital signs:  q12 hours  -- Precautions: suicide, elopement, and assault -- Encouraged patient to participate in unit milieu and in scheduled group therapies   2. Psychiatric Diagnoses and Treatment:   Continue Seroquel  25 mg in the morning and continue 100 mg at bedtime. Continue lamictal  to 25 mg QAM and 100mg  QHS     3. Medical Issues Being Addressed:    Appreciate hospital recommendation.  Continue Anusol.   Continue Lasix .  Monitor electrolytes.    Will ask the staff to give compression stockings if behavioral health rules permits.  Echo  ejection fraction normal, grade 1 diastolic dysfunction   Free T4  WNL   4. Discharge Planning:   -- Social work and case management to assist with discharge planning and identification of hospital follow-up needs prior to discharge  -- Estimated LOS: 3-4 days  Albert Huff,  MD 11/03/2023, 1:43 PM

## 2023-11-03 NOTE — Plan of Care (Signed)
  Problem: Activity: Goal: Interest or engagement in leisure activities will improve Outcome: Progressing Goal: Imbalance in normal sleep/wake cycle will improve Outcome: Progressing   Problem: Education: Goal: Will be free of psychotic symptoms Outcome: Progressing

## 2023-11-03 NOTE — Group Note (Signed)
 Date:  11/03/2023 Time:  11:34 AM  Group Topic/Focus:  Recovery Goals:   The focus of this group is to identify appropriate goals for recovery and establish a plan to achieve them.    Participation Level:  Active  Participation Quality:  Appropriate  Affect:  Appropriate  Cognitive:  Appropriate  Insight: Appropriate  Engagement in Group:  Engaged  Modes of Intervention:  Activity  Additional Comments:    Marianna Shirk Josten [place] 11/03/2023, 11:34 AM

## 2023-11-03 NOTE — Assessment & Plan Note (Addendum)
 Blood pressure currently within goal. -Continuing home losartan 

## 2023-11-03 NOTE — Progress Notes (Signed)
 Pt requested visit-at time of arrival pt was getting echocardiogram done. Will return after lunch for visit

## 2023-11-03 NOTE — Group Note (Signed)
 Recreation Therapy Group Note   Group Topic:Other  Group Date: 11/03/2023 Start Time: 1400 End Time: 1450 Facilitators: Deatrice Factor, LRT, CTRS Location: Dayroom  Activity Description/Intervention: Therapeutic Drumming. Patients with peers and staff were given the opportunity to engage in a leader facilitated [facility] Group Empowerment Drumming Circle with staff from the [employer], in partnership with [employer]. Teaching laboratory technician and trained [employer], Kathlyne Parchment leading with LRT observing and documenting intervention and pt response. This evidenced-based practice targets 7 areas of health and wellbeing in the human experience including: stress-reduction, exercise, self-expression, camaraderie/support, nurturing, spirituality, and music-making (leisure).    Goal Area(s) Addresses:  Patient will engage in pro-social way in music group.  Patient will follow directions of drum leader on the first prompt. Patient will demonstrate no behavioral issues during group.  Patient will identify if a reduction in stress level occurs as a result of participation in therapeutic drum circle.     Affect/Mood: Appropriate   Participation Level: Minimal    Clinical Observations/Individualized Feedback: Stephanie Anthony was present in the dayroom at the time of group. Pt chose not to play the drums or sit in the circle and said that she needed to keep her legs up.   Plan: Continue to engage patient in RT group sessions 2-3x/week.   Deatrice Factor, LRT, CTRS 11/03/2023 5:02 PM

## 2023-11-03 NOTE — Progress Notes (Signed)
   11/03/23 1400  Psych Admission Type (Psych Patients Only)  Admission Status Voluntary  Psychosocial Assessment  Patient Complaints None  Eye Contact Fair  Facial Expression Animated  Affect Appropriate to circumstance  Speech Logical/coherent  Interaction Assertive  Motor Activity Unsteady  Appearance/Hygiene Unremarkable  Behavior Characteristics Cooperative  Mood Pleasant  Thought Chartered certified accountant of ideas  Content WDL  Delusions None reported or observed  Perception WDL  Hallucination None reported or observed  Judgment Impaired  Confusion Mild  Danger to Self  Current suicidal ideation? Denies  Agreement Not to Harm Self Yes  Description of Agreement verbal  Danger to Others  Danger to Others None reported or observed

## 2023-11-03 NOTE — Assessment & Plan Note (Addendum)
 Renal function currently within baseline but slightly increased creatinine , now at 2 -Monitor renal function closely  -Avoid nephrotoxins

## 2023-11-03 NOTE — Assessment & Plan Note (Addendum)
 Patient with significant lower extremity edema.  No shortness of breath or orthopnea.  BNP normal.  TSH elevated with low normal free T4.  Echocardiogram with normal EF, grade 1 diastolic dysfunction - Discontinue further p.o. Lasix -already received today's dose due to worsening creatinine - Compression stockings ordered -Raise legs while sitting -Monitor renal function while being diuresed

## 2023-11-03 NOTE — Group Note (Signed)
 Date:  11/03/2023 Time:  9:58 PM  Group Topic/Focus:  Wrap-Up Group:   The focus of this group is to help patients review their daily goal of treatment and discuss progress on daily workbooks.    Participation Level:  Active  Participation Quality:  Appropriate  Affect:  Appropriate  Cognitive:  Appropriate  Insight: Appropriate  Engagement in Group:  Engaged  Modes of Intervention:  Discussion  Additional Comments:    Rolland Cline 11/03/2023, 9:58 PM

## 2023-11-03 NOTE — Plan of Care (Signed)
  Problem: Activity: Goal: Interest or engagement in leisure activities will improve Outcome: Progressing Goal: Imbalance in normal sleep/wake cycle will improve Outcome: Progressing   Problem: Coping: Goal: Coping ability will improve Outcome: Progressing Goal: Will verbalize feelings Outcome: Progressing   

## 2023-11-04 LAB — BASIC METABOLIC PANEL WITH GFR
Anion gap: 12 (ref 5–15)
BUN: 44 mg/dL — ABNORMAL HIGH (ref 8–23)
CO2: 23 mmol/L (ref 22–32)
Calcium: 8.7 mg/dL — ABNORMAL LOW (ref 8.9–10.3)
Chloride: 105 mmol/L (ref 98–111)
Creatinine, Ser: 2.04 mg/dL — ABNORMAL HIGH (ref 0.44–1.00)
GFR, Estimated: 23 mL/min — ABNORMAL LOW (ref >60)
Glucose, Bld: 103 mg/dL — ABNORMAL HIGH (ref 70–99)
Potassium: 4.1 mmol/L (ref 3.5–5.1)
Sodium: 140 mmol/L (ref 135–145)

## 2023-11-04 NOTE — Progress Notes (Signed)
 [facility] MD Progress Note  11/04/2023 3:31 PM Stephanie Anthony  MRN:  [id number]  Patient is a 89 y/o WF presents to ED with worsening hallucinations, mania.  She reports her psychiatrist told her to stop taking her aripiprazole  recently due to kidney function concerns.  However, review of medical records indicates this medication was  discontinued 09/29/22 due to TD and Parkinson symptoms.  The initial plan documented was to increase lamotrigine  if needed; however, appears she has been fairly stable on lamotrigine  current dose since discontinue of aripiprazole  until recently; she is noted to have a UTI which most likely has contributed to recent mania episode. Patient is admitted to [facility] unit with Q15 min safety monitoring. Multidisciplinary team approach is offered. Medication management; group/milieu therapy is offered.   Subjective:    Chart is reviewed and case discussed with the treatment team.  Patient reports her concerns about edematous feet and she showed the wound marks on the swollen legs.  Provider informed the patient that hospitalist consult was placed and they will be checking on the patient.  She denies any hearing voices or seeing things unusual.  She reports feeling high-energy but patient is noted to be lot better since her admission.  Patient is calm and redirectable.  Patient denies suicidal/homicidal ideation/plan.  She denies having any anxiety or depression.  She is anxious about the transition back home.  Provider has informed the social work team to work on postdischarge planning and calling patient's son to discuss the plans.   Sleep: Fair  Appetite:  Fair  Past Psychiatric History: see h&P Family History:  Family History  Problem Relation Age of Onset   Heart Problems Mother    Seizures Mother    Stroke Mother    Diverticulitis Mother    Alcohol abuse Father    Heart attack Father    Heart attack Sister    Pancreatitis Sister    Cancer Sister    Cancer Brother     Social History:  Social History   Substance and Sexual Activity  Alcohol Use No   Alcohol/week: 0.0 standard drinks of alcohol     Social History   Substance and Sexual Activity  Drug Use No    Social History   Socioeconomic History   Marital status: Widowed    Spouse name: Not on file   Number of children: 2   Years of education: Not on file   Highest education level: 6th grade  Occupational History   Not on file  Tobacco Use   Smoking status: Former   Smokeless tobacco: Never   Tobacco comments:    quit 1964  Vaping Use   Vaping status: Never Used  Substance and Sexual Activity   Alcohol use: No    Alcohol/week: 0.0 standard drinks of alcohol   Drug use: No   Sexual activity: Not Currently  Other Topics Concern   Not on file  Social History Narrative   Lives at home alone. Son lives in [place].   Social Drivers of Corporate investment banker Strain: Low Risk  (08/05/2022)   Received from [facility]   Overall Financial Resource Strain (CARDIA)    Difficulty of Paying Living Expenses: Not hard at all  Food Insecurity: No Food Insecurity (10/28/2023)   Hunger Vital Sign    Worried About Running Out of Food in the Last Year: Never true    Ran Out of Food in the Last Year: Never true  Transportation Needs: No Transportation Needs (10/28/2023)   PRAPARE - Administrator, Civil Service (Medical): No    Lack of Transportation (Non-Medical): No  Physical Activity: Not on file  Stress: Not on file  Social Connections: Socially Isolated (10/28/2023)   Social Connection and Isolation Panel    Frequency of Communication with Friends and Family: More than three times a week    Frequency of Social Gatherings with Friends and Family: More than three times a week    Attends Religious Services: Never    Database administrator or Organizations: No    Attends Banker Meetings: Never    Marital Status: Widowed   Past  Medical History:  Past Medical History:  Diagnosis Date   Arthritis    Bipolar 1 disorder (HCC)    Cataracts, bilateral    Chronic kidney disease    stage 3   Chronic kidney disease    Chronic mitral valve disease    Coronary artery disease    Deviated nasal septum    Erosive esophagitis    Erosive esophagitis    Esophageal stricture    Fibroid tumor    GERD (gastroesophageal reflux disease)    History of hiatal hernia    Hypertension    Hypothyroidism    Murmur, cardiac    Nephrogenic diabetes insipidus (HCC)    Pure hypercholesterolemia    Renal cyst, left    bilateral   Seasonal allergies     Past Surgical History:  Procedure Laterality Date   CARDIAC CATHETERIZATION     CHOLECYSTECTOMY     COLONOSCOPY WITH PROPOFOL  N/A 06/01/2018   Procedure: COLONOSCOPY WITH PROPOFOL ;  Surgeon: Deveron Fly, MD;  Location: [facility] ENDOSCOPY;  Service: Endoscopy;  Laterality: N/A;   ESOPHAGEAL DILATION     ESOPHAGOGASTRODUODENOSCOPY (EGD) WITH PROPOFOL  N/A 10/20/2014   Procedure: ESOPHAGOGASTRODUODENOSCOPY (EGD) WITH PROPOFOL ;  Surgeon: Deveron Fly, MD;  Location: [facility] ENDOSCOPY;  Service: Endoscopy;  Laterality: N/A;   ESOPHAGOGASTRODUODENOSCOPY (EGD) WITH PROPOFOL  N/A 06/01/2018   Procedure: ESOPHAGOGASTRODUODENOSCOPY (EGD) WITH PROPOFOL ;  Surgeon: Deveron Fly, MD;  Location: [facility] ENDOSCOPY;  Service: Endoscopy;  Laterality: N/A;   INFECTED SKIN DEBRIDEMENT     on back several years ago   JOINT REPLACEMENT     KNEE ARTHROPLASTY Left 06/10/2023   Procedure: COMPUTER ASSISTED TOTAL KNEE ARTHROPLASTY;  Surgeon: Arlyne Lame, MD;  Location: [facility] ORS;  Service: Orthopedics;  Laterality: Left;   knee replacement, right     LAPAROSCOPIC APPENDECTOMY N/A 06/21/2018   Procedure: APPENDECTOMY LAPAROSCOPIC;  Surgeon: Eldred Grego, MD;  Location: [facility] ORS;  Service: General;  Laterality: N/A;   NOSE SURGERY     THYROIDECTOMY     TOTAL THYROIDECTOMY      Current  Medications: Current Facility-Administered Medications  Medication Dose Route Frequency Provider Last Rate Last Admin   acetaminophen  (TYLENOL ) tablet 650 mg  650 mg Oral Q6H PRN Penn, Cicely, NP   650 mg at 11/03/23 2216   alum & mag hydroxide-simeth (MAALOX/MYLANTA) 200-200-20 MG/5ML suspension 30 mL  30 mL Oral Q4H PRN Penn, Alaine Howells, NP   30 mL at 11/03/23 0316   diphenhydrAMINE -zinc acetate (BENADRYL ) 2-0.1 % cream   Topical TID PRN Shrivastava, Aryendra, MD   1 Application at 11/01/23 0941   hydrocortisone (ANUSOL-HC) 2.5 % rectal cream   Rectal QID Shrivastava, Aryendra, MD   Given at 11/04/23 0926   lamoTRIgine  (LAMICTAL ) tablet 100 mg  100 mg Oral QHS Kenyata Guess, MD  100 mg at 11/03/23 2157   lamoTRIgine  (LAMICTAL ) tablet 25 mg  25 mg Oral Daily Deaglan Lile, MD   25 mg at 11/04/23 7829   latanoprost  (XALATAN ) 0.005 % ophthalmic solution 1 drop  1 drop Both Eyes QHS Rodnesha Elie, MD   1 drop at 11/03/23 2158   levothyroxine  (SYNTHROID ) tablet 112 mcg  112 mcg Oral Q0600 Amin, Sumayya, MD   112 mcg at 11/04/23 5621   losartan  (COZAAR ) tablet 25 mg  25 mg Oral Daily Hannahgrace Lalli, MD   25 mg at 11/04/23 3086   OLANZapine  (ZYPREXA ) injection 5 mg  5 mg Intramuscular TID PRN Monroe Antigua, NP       OLANZapine  zydis (ZYPREXA ) disintegrating tablet 5 mg  5 mg Oral TID PRN Monroe Antigua, NP       pantoprazole  (PROTONIX ) EC tablet 40 mg  40 mg Oral Daily Bethany Cumming, MD   40 mg at 11/04/23 5784   pravastatin  (PRAVACHOL ) tablet 10 mg  10 mg Oral q1800 Lucina Betty, MD   10 mg at 11/03/23 1725   QUEtiapine  (SEROQUEL ) tablet 100 mg  100 mg Oral QHS Shrivastava, Aryendra, MD   100 mg at 11/03/23 2157   QUEtiapine  (SEROQUEL ) tablet 25 mg  25 mg Oral Q breakfast Penn, Alaine Howells, NP   25 mg at 11/04/23 0925   traZODone  (DESYREL ) tablet 50 mg  50 mg Oral QHS PRN Monroe Antigua, NP   50 mg at 11/03/23 2157    Lab Results:  Results for orders placed or performed during the hospital encounter  of 10/28/23 (from the past 48 hours)  Renal function panel     Status: Abnormal   Collection Time: 11/03/23  6:55 AM  Result Value Ref Range   Sodium 141 135 - 145 mmol/L   Potassium 4.0 3.5 - 5.1 mmol/L   Chloride 102 98 - 111 mmol/L   CO2 29 22 - 32 mmol/L   Glucose, Bld 108 (H) 70 - 99 mg/dL    Comment: Glucose reference range applies only to samples taken after fasting for at least 8 hours.   BUN 45 (H) 8 - 23 mg/dL   Creatinine, Ser 6.96 (H) 0.44 - 1.00 mg/dL   Calcium 9.2 8.9 - 29.5 mg/dL   Phosphorus 5.1 (H) 2.5 - 4.6 mg/dL   Albumin 3.5 3.5 - 5.0 g/dL   GFR, Estimated 25 (L) >60 mL/min    Comment: (NOTE) Calculated using the CKD-EPI Creatinine Equation (2021)    Anion gap 10 5 - 15    Comment: Performed at [facility], [address] [place], [place] [address]  Basic metabolic panel with GFR     Status: Abnormal   Collection Time: 11/04/23  6:40 AM  Result Value Ref Range   Sodium 140 135 - 145 mmol/L   Potassium 4.1 3.5 - 5.1 mmol/L   Chloride 105 98 - 111 mmol/L   CO2 23 22 - 32 mmol/L   Glucose, Bld 103 (H) 70 - 99 mg/dL    Comment: Glucose reference range applies only to samples taken after fasting for at least 8 hours.   BUN 44 (H) 8 - 23 mg/dL   Creatinine, Ser 2.44 (H) 0.44 - 1.00 mg/dL   Calcium 8.7 (L) 8.9 - 10.3 mg/dL   GFR, Estimated 23 (L) >60 mL/min    Comment: (NOTE) Calculated using the CKD-EPI Creatinine Equation (2021)    Anion gap 12 5 - 15    Comment: Performed at [facility], [address] [place], [place] [address]    Blood Alcohol level:  Lab Results  Component Value Date   [facility] <15 10/27/2023   ETH <5 07/07/2015    Metabolic Disorder Labs: Lab Results  Component Value Date   HGBA1C 5.8 (H) 10/27/2023   MPG 119.76 10/27/2023   No results found for: PROLACTIN No results found for: CHOL, TRIG, HDL, CHOLHDL, VLDL, LDLCALC  Physical Findings: AIMS:  , ,  ,  ,    CIWA:    COWS:       Psychiatric Specialty Exam:  Presentation  General Appearance:  Appropriate for Environment; Casual  Eye Contact: Fair  Speech: Clear and Coherent  Speech Volume: Normal    Mood and Affect  Mood: Euthymic  Affect: Appropriate   Thought Process  Thought Processes: Coherent  Descriptions of Associations:Intact  Orientation:Full (Time, Place and Person)  Thought Content:Logical  Hallucinations: none reported   Ideas of Reference:None  Suicidal Thoughts:Suicidal Thoughts: No    Homicidal Thoughts:Homicidal Thoughts: No     Sensorium  Memory: Immediate Fair; Remote Fair; Recent Fair  Judgment: Fair  Insight: Fair   Art therapist  Concentration: Fair  Attention Span: Fair  Recall: [employer] of Knowledge: Fair  Language: Fair   Psychomotor Activity  Psychomotor Activity: Psychomotor Activity: Normal    Musculoskeletal: Strength & Muscle Tone: within normal limits Gait & Station: unsteady Assets  Assets: Manufacturing systems engineer; Desire for Improvement; Social Support; Resilience    Physical Exam: Physical Exam Vitals and nursing note reviewed.  HENT:     Head: Normocephalic.   Neurological:     Mental Status: She is alert.    Review of Systems  Constitutional: Negative.   HENT: Negative.    Eyes: Negative.   Skin: Negative.    Blood pressure (!) 149/52, pulse 77, temperature 98.3 F (36.8 C), resp. rate 18, height 5' 7 (1.702 m), weight 80.3 kg, SpO2 98%. Body mass index is 27.72 kg/m.  Diagnosis: Principal Problem:   Bipolar disorder (HCC) Active Problems:   Hypothyroidism   CKD (chronic kidney disease), stage IV (HCC)   Hypertension   Peripheral edema   PLAN: Safety and Monitoring:  -- Voluntary admission to inpatient psychiatric unit for safety, stabilization and treatment  -- Daily contact with patient to assess and evaluate symptoms and progress in treatment  -- Patient's case to be  discussed in multi-disciplinary team meeting  -- Observation Level : q15 minute checks  -- Vital signs:  q12 hours  -- Precautions: suicide, elopement, and assault -- Encouraged patient to participate in unit milieu and in scheduled group therapies   2. Psychiatric Diagnoses and Treatment:   Continue Seroquel  25 mg in the morning and continue 100 mg at bedtime. Continue lamictal  to 25 mg QAM and 100mg  QHS     3. Medical Issues Being Addressed:    Appreciate hospital recommendation.  Continue Anusol.   Continue Lasix .  Monitor electrolytes.    Will ask the staff to give compression stockings if behavioral health rules permits.  Echo  ejection fraction normal, grade 1 diastolic dysfunction   Free T4  WNL   4. Discharge Planning:   -- Social work and case management to assist with discharge planning and identification of hospital follow-up needs prior to discharge  -- Estimated LOS: 3-4 days  Mohannad Olivero, MD 11/04/2023, 3:31 PM

## 2023-11-04 NOTE — Group Note (Signed)
 Date:  11/04/2023 Time:  11:32 PM  Group Topic/Focus:  Wrap-Up Group:   The focus of this group is to help patients review their daily goal of treatment and discuss progress on daily workbooks.    Participation Level:  Active  Participation Quality:  Appropriate  Affect:  Appropriate  Cognitive:  Appropriate  Insight: Good  Engagement in Group:  Engaged  Modes of Intervention:  Discussion   Bradley Caffey 11/04/2023, 11:32 PM

## 2023-11-04 NOTE — Plan of Care (Signed)
  Problem: Education: Goal: Utilization of techniques to improve thought processes will improve Outcome: Progressing Goal: Knowledge of the prescribed therapeutic regimen will improve Outcome: Progressing   Problem: Activity: Goal: Interest or engagement in leisure activities will improve Outcome: Progressing Goal: Imbalance in normal sleep/wake cycle will improve Outcome: Progressing   

## 2023-11-04 NOTE — Group Note (Signed)
 Date:  11/04/2023 Time:  10:49 AM  Group Topic/Focus:  Fresh air Therapy Outdoors, with Music and conversation    Participation Level:  Did Not Attend   Merton Abts 11/04/2023, 10:49 AM

## 2023-11-04 NOTE — Plan of Care (Signed)
  Problem: Education: Goal: Utilization of techniques to improve thought processes will improve Outcome: Progressing Goal: Knowledge of the prescribed therapeutic regimen will improve Outcome: Progressing   Problem: Activity: Goal: Interest or engagement in leisure activities will improve Outcome: Progressing Goal: Imbalance in normal sleep/wake cycle will improve Outcome: Progressing   Problem: Coping: Goal: Coping ability will improve Outcome: Progressing Goal: Will verbalize feelings Outcome: Progressing   Problem: Health Behavior/Discharge Planning: Goal: Ability to make decisions will improve Outcome: Progressing Goal: Compliance with therapeutic regimen will improve Outcome: Progressing   Problem: Role Relationship: Goal: Will demonstrate positive changes in social behaviors and relationships Outcome: Progressing   Problem: Safety: Goal: Ability to disclose and discuss suicidal ideas will improve Outcome: Progressing Goal: Ability to identify and utilize support systems that promote safety will improve Outcome: Progressing   Problem: Self-Concept: Goal: Will verbalize positive feelings about self Outcome: Progressing Goal: Level of anxiety will decrease Outcome: Progressing   Problem: Activity: Goal: Will verbalize the importance of balancing activity with adequate rest periods Outcome: Progressing   Problem: Education: Goal: Will be free of psychotic symptoms Outcome: Progressing Goal: Knowledge of the prescribed therapeutic regimen will improve Outcome: Progressing   Problem: Coping: Goal: Coping ability will improve Outcome: Progressing Goal: Will verbalize feelings Outcome: Progressing   Problem: Health Behavior/Discharge Planning: Goal: Compliance with prescribed medication regimen will improve Outcome: Progressing   Problem: Nutritional: Goal: Ability to achieve adequate nutritional intake will improve Outcome: Progressing   Problem: Role  Relationship: Goal: Ability to communicate needs accurately will improve Outcome: Progressing Goal: Ability to interact with others will improve Outcome: Progressing   Problem: Safety: Goal: Ability to redirect hostility and anger into socially appropriate behaviors will improve Outcome: Progressing Goal: Ability to remain free from injury will improve Outcome: Progressing   Problem: Self-Care: Goal: Ability to participate in self-care as condition permits will improve Outcome: Progressing   Problem: Self-Concept: Goal: Will verbalize positive feelings about self Outcome: Progressing

## 2023-11-04 NOTE — BH IP Treatment Plan (Signed)
 Interdisciplinary Treatment and Diagnostic Plan Update  11/04/2023 Time of Session: 2:30 PM  Stephanie Anthony MRN: [id number]  Principal Diagnosis: Bipolar disorder [facility])  Secondary Diagnoses: Principal Problem:   Bipolar disorder (HCC) Active Problems:   Hypothyroidism   CKD (chronic kidney disease), stage IV (HCC)   Hypertension   Peripheral edema   Current Medications:  Current Facility-Administered Medications  Medication Dose Route Frequency Provider Last Rate Last Admin   acetaminophen  (TYLENOL ) tablet 650 mg  650 mg Oral Q6H PRN Penn, Cicely, NP   650 mg at 11/03/23 2216   alum & mag hydroxide-simeth (MAALOX/MYLANTA) 200-200-20 MG/5ML suspension 30 mL  30 mL Oral Q4H PRN Penn, Alaine Howells, NP   30 mL at 11/03/23 0316   diphenhydrAMINE -zinc acetate (BENADRYL ) 2-0.1 % cream   Topical TID PRN Shrivastava, Aryendra, MD   1 Application at 11/01/23 0941   hydrocortisone (ANUSOL-HC) 2.5 % rectal cream   Rectal QID Shrivastava, Aryendra, MD   Given at 11/04/23 0926   lamoTRIgine  (LAMICTAL ) tablet 100 mg  100 mg Oral QHS Jadapalle, Sree, MD   100 mg at 11/03/23 2157   lamoTRIgine  (LAMICTAL ) tablet 25 mg  25 mg Oral Daily Jadapalle, Sree, MD   25 mg at 11/04/23 0925   latanoprost  (XALATAN ) 0.005 % ophthalmic solution 1 drop  1 drop Both Eyes QHS Jadapalle, Sree, MD   1 drop at 11/03/23 2158   levothyroxine  (SYNTHROID ) tablet 112 mcg  112 mcg Oral Q0600 Amin, Sumayya, MD   112 mcg at 11/04/23 5784   losartan  (COZAAR ) tablet 25 mg  25 mg Oral Daily Jadapalle, Sree, MD   25 mg at 11/04/23 6962   OLANZapine  (ZYPREXA ) injection 5 mg  5 mg Intramuscular TID PRN Monroe Antigua, NP       OLANZapine  zydis (ZYPREXA ) disintegrating tablet 5 mg  5 mg Oral TID PRN Penn, Alaine Howells, NP       pantoprazole  (PROTONIX ) EC tablet 40 mg  40 mg Oral Daily Jadapalle, Sree, MD   40 mg at 11/04/23 9528   pravastatin  (PRAVACHOL ) tablet 10 mg  10 mg Oral q1800 Jadapalle, Sree, MD   10 mg at 11/03/23 1725   QUEtiapine   (SEROQUEL ) tablet 100 mg  100 mg Oral QHS Shrivastava, Aryendra, MD   100 mg at 11/03/23 2157   QUEtiapine  (SEROQUEL ) tablet 25 mg  25 mg Oral Q breakfast Penn, Alaine Howells, NP   25 mg at 11/04/23 4132   traZODone  (DESYREL ) tablet 50 mg  50 mg Oral QHS PRN Monroe Antigua, NP   50 mg at 11/03/23 2157   PTA Medications: Medications Prior to Admission  Medication Sig Dispense Refill Last Dose/Taking   ARIPiprazole  (ABILIFY ) 2 MG tablet Take 1 tablet (2 mg total) by mouth at bedtime. 30 tablet 0    lamoTRIgine  (LAMICTAL ) 100 MG tablet Take 1 tablet (100 mg total) by mouth at bedtime. 90 tablet 0    latanoprost  (XALATAN ) 0.005 % ophthalmic solution Place 1 drop into both eyes at bedtime.      levothyroxine  (SYNTHROID ) 100 MCG tablet Take 100 mcg by mouth daily before breakfast.      losartan  (COZAAR ) 25 MG tablet Take 25 mg by mouth daily.      lovastatin (MEVACOR) 40 MG tablet Take 40 mg by mouth at bedtime.   6    pantoprazole  (PROTONIX ) 40 MG tablet Take 40 mg by mouth daily.        Patient Stressors:    Patient Strengths:    Treatment Modalities: Medication  Management, Group therapy, Case management,  1 to 1 session with clinician, Psychoeducation, Recreational therapy.   Physician Treatment Plan for Primary Diagnosis: Bipolar disorder (HCC) Long Term Goal(s): Improvement in symptoms so as ready for discharge   Short Term Goals: Ability to identify changes in lifestyle to reduce recurrence of condition will improve Ability to verbalize feelings will improve Ability to disclose and discuss suicidal ideas Ability to demonstrate self-control will improve Ability to identify and develop effective coping behaviors will improve Ability to maintain clinical measurements within normal limits will improve Compliance with prescribed medications will improve  Medication Management: Evaluate patient's response, side effects, and tolerance of medication regimen.  Therapeutic Interventions: 1 to 1  sessions, Unit Group sessions and Medication administration.  Evaluation of Outcomes: Adequate for Discharge  Physician Treatment Plan for Secondary Diagnosis: Principal Problem:   Bipolar disorder (HCC) Active Problems:   Hypothyroidism   CKD (chronic kidney disease), stage IV (HCC)   Hypertension   Peripheral edema  Long Term Goal(s): Improvement in symptoms so as ready for discharge   Short Term Goals: Ability to identify changes in lifestyle to reduce recurrence of condition will improve Ability to verbalize feelings will improve Ability to disclose and discuss suicidal ideas Ability to demonstrate self-control will improve Ability to identify and develop effective coping behaviors will improve Ability to maintain clinical measurements within normal limits will improve Compliance with prescribed medications will improve     Medication Management: Evaluate patient's response, side effects, and tolerance of medication regimen.  Therapeutic Interventions: 1 to 1 sessions, Unit Group sessions and Medication administration.  Evaluation of Outcomes: Adequate for Discharge   RN Treatment Plan for Primary Diagnosis: Bipolar disorder (HCC) Long Term Goal(s): Knowledge of disease and therapeutic regimen to maintain health will improve  Short Term Goals: Ability to remain free from injury will improve, Ability to verbalize frustration and anger appropriately will improve, Ability to demonstrate self-control, Ability to participate in decision making will improve, Ability to verbalize feelings will improve, Ability to disclose and discuss suicidal ideas, Ability to identify and develop effective coping behaviors will improve, and Compliance with prescribed medications will improve  Medication Management: RN will administer medications as ordered by provider, will assess and evaluate patient's response and provide education to patient for prescribed medication. RN will report any adverse  and/or side effects to prescribing provider.  Therapeutic Interventions: 1 on 1 counseling sessions, Psychoeducation, Medication administration, Evaluate responses to treatment, Monitor vital signs and CBGs as ordered, Perform/monitor CIWA, COWS, AIMS and Fall Risk screenings as ordered, Perform wound care treatments as ordered.  Evaluation of Outcomes: Adequate for Discharge   LCSW Treatment Plan for Primary Diagnosis: Bipolar disorder [facility]) Long Term Goal(s): Safe transition to appropriate next level of care at discharge, Engage patient in therapeutic group addressing interpersonal concerns.  Short Term Goals: Engage patient in aftercare planning with referrals and resources, Increase social support, Increase ability to appropriately verbalize feelings, Increase emotional regulation, Facilitate acceptance of mental health diagnosis and concerns, Facilitate patient progression through stages of change regarding substance use diagnoses and concerns, Identify triggers associated with mental health/substance abuse issues, and Increase skills for wellness and recovery  Therapeutic Interventions: Assess for all discharge needs, 1 to 1 time with Social worker, Explore available resources and support systems, Assess for adequacy in community support network, Educate family and significant other(s) on suicide prevention, Complete Psychosocial Assessment, Interpersonal group therapy.  Evaluation of Outcomes: Adequate for Discharge   Progress in Treatment: Attending groups: Yes.  Participating in groups: Yes. Taking medication as prescribed: Yes. Toleration medication: Yes. Family/Significant other contact made: No, will contact:  CSW will contact if given permission  Patient understands diagnosis: Yes. Discussing patient identified problems/goals with staff: Yes. Medical problems stabilized or resolved: Yes. Denies suicidal/homicidal ideation: Yes. Issues/concerns per patient self-inventory:  No. Other: None    New problem(s) identified: No, Describe:  none identified Update 11/04/23: No changes at this time   New Short Term/Long Term Goal(s): elimination of symptoms of psychosis, medication management for mood stabilization; elimination of SI thoughts; development of comprehensive mental wellness plan. Update 11/04/23: No changes at this time   Patient Goals:  to get me back from seeing all these images and get this urinary tract infection cleared up Update 11/04/23: No changes at this time   Discharge Plan or Barriers: CSW to assist with appropriate discharge planning Update 11/04/23: No changes at this time   Reason for Continuation of Hospitalization: Mania Medication stabilization   Estimated Length of Stay: 1 to 7 days Update 11/04/23: 11/05/23  Last 3 [place] Suicide Severity Risk Score: Flowsheet Row Admission (Current) from 10/28/2023 in [facility] ED from 10/27/2023 in [facility] Emergency Department at [facility] ED from 06/29/2023 in [facility] Emergency Department at [facility]  C-SSRS RISK CATEGORY No Risk No Risk No Risk    Last PHQ 2/9 Scores:    06/01/2023   11:47 AM 09/29/2022   12:02 PM 07/10/2022   12:05 PM  Depression screen PHQ 2/9  Decreased Interest 1 1 2   Down, Depressed, Hopeless 0 1 1  PHQ - 2 Score 1 2 3   Altered sleeping  1 1  Tired, decreased energy  2 3  Change in appetite  0 0  Feeling bad or failure about yourself   1 2  Trouble concentrating  0 0  Moving slowly or fidgety/restless  0 0  Suicidal thoughts  0 0  PHQ-9 Score  6 9  Difficult doing work/chores  Somewhat difficult Somewhat difficult    Scribe for Treatment Team: Claudio Culver, Milinda Allen 11/04/2023 3:08 PM

## 2023-11-04 NOTE — Progress Notes (Signed)
   11/03/23 2100  Psych Admission Type (Psych Patients Only)  Admission Status Voluntary  Psychosocial Assessment  Patient Complaints None  Eye Contact Fair  Facial Expression Animated  Affect Appropriate to circumstance  Speech Logical/coherent  Interaction Assertive  Motor Activity Unsteady  Appearance/Hygiene Unremarkable  Behavior Characteristics Cooperative  Mood Pleasant  Thought Chartered certified accountant of ideas  Content WDL  Delusions None reported or observed  Perception WDL  Hallucination None reported or observed  Judgment Impaired  Confusion Mild  Danger to Self  Current suicidal ideation? Denies  Agreement Not to Harm Self Yes  Description of Agreement verbal  Danger to Others  Danger to Others None reported or observed   Pt out in the milieu attended group interacted with peers appropriately. Pt required prn Tylenol  for pain and Trazodone  for sleep both noted effective. Pt denies SI/HI/AVH. When pt asked about mood pt noted to mentioned how the swelling in her legs and stated I keep telling people I do not think I will make it to my next birthday. I really do not. Pt denies anxiety or depression. Med complaint. Provided pt with emotional support. POC.

## 2023-11-04 NOTE — Progress Notes (Signed)
   11/04/23 1057  Psych Admission Type (Psych Patients Only)  Admission Status Voluntary  Psychosocial Assessment  Patient Complaints None  Eye Contact Fair  Facial Expression Animated  Affect Appropriate to circumstance  Speech Logical/coherent  Interaction Assertive  Motor Activity Unsteady  Appearance/Hygiene Unremarkable  Behavior Characteristics Cooperative  Mood Pleasant  Thought Chartered certified accountant of ideas  Content WDL  Delusions None reported or observed  Perception WDL  Hallucination None reported or observed  Judgment Impaired  Confusion Mild  Danger to Self  Current suicidal ideation? Denies  Agreement Not to Harm Self Yes  Description of Agreement verbal  Danger to Others  Danger to Others None reported or observed

## 2023-11-04 NOTE — Progress Notes (Signed)
   11/04/23 2000  Psych Admission Type (Psych Patients Only)  Admission Status Voluntary  Psychosocial Assessment  Patient Complaints None  Eye Contact Fair  Facial Expression Animated  Affect Appropriate to circumstance  Speech Logical/coherent  Interaction Assertive  Motor Activity Unsteady  Appearance/Hygiene Unremarkable  Behavior Characteristics Cooperative  Mood Pleasant  Thought Chartered certified accountant of ideas  Content WDL  Delusions None reported or observed  Perception WDL  Hallucination None reported or observed  Judgment Impaired  Confusion Mild  Danger to Self  Current suicidal ideation? Denies  Agreement Not to Harm Self Yes  Description of Agreement vebral  Danger to Others  Danger to Others None reported or observed

## 2023-11-04 NOTE — Group Note (Signed)
 Physical/Occupational Therapy Group Note  Group Topic: Yoga  Group Date: 11/04/2023 Start Time: 1300 End Time: 1333 Facilitators: Trinia Georgi, Otelia Blew, PT   Group Description: Group participated with series of yoga poses, designed to emphasize functional sitting balance, core stability, generalized flexibility and overall posture.  Incorporated deep breathing techniques with poses, working to promote relaxation, mindfulness and focus with targeted activities.   Discussed benefits of yoga in improving mood and self-esteem, reducing stress and anxiety, and promoting functional strength and balance for each participant.  Discussed ways to integrate into each participant's daily routine.  Provided handout with written and pictorial descriptions of included yoga movements to be utilized as appropriate outside of group time.  Therapeutic Goal(s):  Demonstrate safe ability to participate with yoga poses during group activity. Identify one benefit of participation with yoga poses as part of each participant's exercise/movement routine. Identify 1-2 individual poses that participant feels most beneficial to his/her needs and that he/she can easily replicate outside of group.  Individual Participation: Pt was quietly observant during the session with only minimal participation with physical activity portion on the session. Pt was willing to participate verbally during the session and participated frequently during discussion.   Participation Level: Moderate   Participation Quality: Minimal Cues   Behavior: Appropriate   Speech/Thought Process: Coherent and Organized   Affect/Mood: Appropriate   Insight: Fair   Judgement: Fair   Modes of Intervention: Activity, Discussion, and Education  Patient Response to Interventions:  Attentive, Interested , and Receptive   Plan: Continue to engage patient in PT/OT groups 1 - 2x/week.  Lavenia Post PT, DPT 11/04/23, 5:03 PM

## 2023-11-04 NOTE — Progress Notes (Signed)
 Consultation Progress Note   Patient: Stephanie Anthony [id number] DOB: [birth year] DOA: 10/28/2023 DOS: the patient was seen and examined on 11/04/2023 Primary service: Aurelia Blotter, MD  Brief hospital course: Taken from original consult note.  Stephanie Anthony is a 89 y.o. female with past medical history of CKD stage IV, HTN, bipolar disorder, who was admitted at behavioral health, was found to have increasing lower extremity swelling.  We were consulted for worsening peripheral edema.  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  Patient was started on p.o. Lasix .  6/16: Vital stable, TSH elevated at 9.363-ordered free T4, UA negative.  BMP with slight worsening of creatinine to 1.85 as compared to the admission 6 days ago.  BNP at 69. patient did receive 80 mg of Lasix  yesterday. Lower extremity venous Doppler negative for DVT.  Echocardiogram pending. Significant lower extremity edema so continuing p.o. Lasix  with close monitoring of renal function.  Patient will also get benefit from compression stockings.  6/17: Vital stable, slight increase in creatinine to 1.93, improving lower extremity edema.  Twice daily p.o. Lasix  decreased to once a day.  Echocardiogram with normal EF, grade 1 diastolic dysfunction and no regional wall motion abnormalities.  Encourage keeping lower extremity elevated and compression stockings-Case was discussed with psychiatry  6/18: Vital stable with slowly worsening creatinine now at 2.  Lower extremity edema improving so discontinuing further p.o. Lasix .  Still no compression stockings so Case was discussed again with nursing staff. Patient should use compression stockings while ambulation and keep the legs elevated at rest. Unfortunately we cannot wrap her legs in a psych unit.  Assessment and Plan: * Bipolar disorder (HCC) - As per primary team at  behavioral health  Peripheral edema Patient with significant lower extremity edema.  No shortness of breath or orthopnea.  BNP normal.  TSH elevated with low normal free T4.  Echocardiogram with normal EF, grade 1 diastolic dysfunction - Discontinue further p.o. Lasix -already received today's dose due to worsening creatinine - Compression stockings ordered -Raise legs while sitting -Monitor renal function while being diuresed  Hypertension Blood pressure currently within goal. -Continuing home losartan   CKD (chronic kidney disease), stage IV (HCC) Renal function currently within baseline but slightly increased creatinine , now at 2 -Monitor renal function closely  -Avoid nephrotoxins  Hypothyroidism Elevated TSH at 9.363 with low normal free T4. - Increasing the dose of Synthroid  to 112 mcg -Patient will need a repeat test in about 4 weeks by PCP   TRH will continue to follow the patient.  Subjective: Patient was eating lunch when seen today.  Complaining of some bug bites in lower extremities.  Edema seems improving.  No shortness of breath  Physical Exam: Vitals:   11/02/23 1918 11/03/23 0711 11/03/23 1930 11/04/23 0725  BP: 123/60 (!) 112/95 (!) 138/44 (!) 149/52  Pulse: 80 83 83 77  Resp: 16 20  18   Temp: (!) 97.3 F (36.3 C) (!) 97.5 F (36.4 C) 98 F (36.7 C) 98.3 F (36.8 C)  TempSrc:      SpO2: 99% 99% 99% 98%  Weight:      Height:       General.  Frail elderly lady, in no acute distress. Pulmonary.  Lungs clear bilaterally, normal respiratory effort. CV.  Regular rate and rhythm, no JVD, rub or murmur. Abdomen.  Soft, nontender, nondistended, BS positive. CNS.  Alert and oriented .  No  focal neurologic deficit. Extremities.  1+ LE edema,  pulses intact and symmetrical. Psychiatry.  Judgment and insight appears normal.   Data Reviewed: Prior data reviewed  Family Communication: Primary team is communicating with family  Time spent: 45 minutes.  This  record has been created using Conservation officer, historic buildings. Errors have been sought and corrected,but may not always be located. Such creation errors do not reflect on the standard of care.   Author: Luna Salinas, MD 11/04/2023 1:09 PM  For on call review [email or web address].

## 2023-11-05 ENCOUNTER — Ambulatory Visit: Admitting: Psychiatry

## 2023-11-05 DIAGNOSIS — F3111 Bipolar disorder, current episode manic without psychotic features, mild: Secondary | ICD-10-CM | POA: Diagnosis not present

## 2023-11-05 LAB — BASIC METABOLIC PANEL WITH GFR
Anion gap: 9 (ref 5–15)
BUN: 49 mg/dL — ABNORMAL HIGH (ref 8–23)
CO2: 23 mmol/L (ref 22–32)
Calcium: 8.9 mg/dL (ref 8.9–10.3)
Chloride: 105 mmol/L (ref 98–111)
Creatinine, Ser: 2.08 mg/dL — ABNORMAL HIGH (ref 0.44–1.00)
GFR, Estimated: 23 mL/min — ABNORMAL LOW (ref >60)
Glucose, Bld: 105 mg/dL — ABNORMAL HIGH (ref 70–99)
Potassium: 3.9 mmol/L (ref 3.5–5.1)
Sodium: 137 mmol/L (ref 135–145)

## 2023-11-05 MED ORDER — HYDROCORTISONE (PERIANAL) 2.5 % EX CREA: TOPICAL_CREAM | Freq: Four times a day (QID) | CUTANEOUS | 0 refills | Status: AC

## 2023-11-05 MED ORDER — QUETIAPINE FUMARATE 100 MG PO TABS: 100.0000 mg | ORAL_TABLET | Freq: Every day | ORAL | 0 refills | Status: DC

## 2023-11-05 MED ORDER — PRAVASTATIN SODIUM 10 MG PO TABS: 10.0000 mg | ORAL_TABLET | Freq: Every day | ORAL | 0 refills | Status: DC

## 2023-11-05 MED ORDER — QUETIAPINE FUMARATE 25 MG PO TABS: 25.0000 mg | ORAL_TABLET | Freq: Every day | ORAL | 0 refills | Status: DC

## 2023-11-05 MED ORDER — LAMOTRIGINE 100 MG PO TABS: 100.0000 mg | ORAL_TABLET | Freq: Every day | ORAL | 0 refills | Status: DC

## 2023-11-05 MED ORDER — LEVOTHYROXINE SODIUM 112 MCG PO TABS: 112.0000 ug | ORAL_TABLET | Freq: Every day | ORAL | 0 refills | Status: DC

## 2023-11-05 MED ORDER — LATANOPROST 0.005 % OP SOLN: 1.0000 [drp] | Freq: Every day | OPHTHALMIC | 12 refills | Status: DC

## 2023-11-05 MED ORDER — LAMOTRIGINE 25 MG PO TABS: 25.0000 mg | ORAL_TABLET | Freq: Every day | ORAL | 0 refills | Status: DC

## 2023-11-05 NOTE — Progress Notes (Signed)
 Consultation Progress Note   Patient: Stephanie Anthony QMV:[id number] DOB: May 01, 1934 DOA: 10/28/2023 DOS: the patient was seen and examined on 11/05/2023 Primary service: Aurelia Blotter, MD  Brief hospital course: Taken from original consult note.  Stephanie Anthony is a 89 y.o. female with past medical history of CKD stage IV, HTN, bipolar disorder, who was admitted at behavioral health, was found to have increasing lower extremity swelling.  We were consulted for worsening peripheral edema.  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  Patient was started on p.o. Lasix .  6/16: Vital stable, TSH elevated at 9.363-ordered free T4, UA negative.  BMP with slight worsening of creatinine to 1.85 as compared to the admission 6 days ago.  BNP at 69. patient did receive 80 mg of Lasix  yesterday. Lower extremity venous Doppler negative for DVT.  Echocardiogram pending. Significant lower extremity edema so continuing p.o. Lasix  with close monitoring of renal function.  Patient will also get benefit from compression stockings.  6/17: Vital stable, slight increase in creatinine to 1.93, improving lower extremity edema.  Twice daily p.o. Lasix  decreased to once a day.  Echocardiogram with normal EF, grade 1 diastolic dysfunction and no regional wall motion abnormalities.  Encourage keeping lower extremity elevated and compression stockings-Case was discussed with psychiatry  6/18: Vital stable with slowly worsening creatinine now at 2.  Lower extremity edema improving so discontinuing further p.o. Lasix .  Still no compression stockings so Case was discussed again with nursing staff. Patient should use compression stockings while ambulation and keep the legs elevated at rest. Unfortunately we cannot wrap her legs in a psych unit.  6/19: Hemodynamically stable, creatinine at 2.08, mostly stable.  She did  received p.o. Lasix  yesterday before discontinuation.  We will hold further diuresis. Lower extremity edema seems much improved but still 1+.  she was not wearing any compression stockings although ordered 2 days ago.  Patient is being discharged by primary team today.  She should be wearing compression stockings during the day and keeping her legs elevated while resting.  Patient need to follow-up with her primary care provider for further assistance.  Assessment and Plan: * Bipolar disorder (HCC) - As per primary team at behavioral health  Peripheral edema Patient with significant lower extremity edema.  No shortness of breath or orthopnea.  BNP normal.  TSH elevated with low normal free T4.  Echocardiogram with normal EF, grade 1 diastolic dysfunction - Discontinue further p.o. Lasix -already received today's dose due to worsening creatinine - Compression stockings ordered -Raise legs while sitting -Monitor renal function while being diuresed  Hypertension Blood pressure currently within goal. -Continuing home losartan   CKD (chronic kidney disease), stage IV (HCC) Renal function currently within baseline but slightly increased creatinine , now at 2 -Monitor renal function closely  -Avoid nephrotoxins  Hypothyroidism Elevated TSH at 9.363 with low normal free T4. - Increasing the dose of Synthroid  to 112 mcg -Patient will need a repeat test in about 4 weeks by PCP   TRH will sign off as patient is going home.  Subjective: Patient was eating lunch when seen today.  Complaining of some bug bites in lower extremities.  Edema seems improving.  No shortness of breath  Physical Exam: Vitals:   11/03/23 1930 11/04/23 0725 11/04/23 1927 11/05/23 0718  BP: (!) 138/44 (!) 149/52 112/61 101/69  Pulse: 83 77 79 78  Resp:  18  16  Temp: 98  F (36.7 C) 98.3 F (36.8 C) 97.7 F (36.5 C) 97.6 F (36.4 C)  TempSrc:      SpO2: 99% 98% 99% 100%  Weight:      Height:       General.   Frail elderly lady, in no acute distress. Pulmonary.  Lungs clear bilaterally, normal respiratory effort. CV.  Regular rate and rhythm, no JVD, rub or murmur. Abdomen.  Soft, nontender, nondistended, BS positive. CNS.  Alert and oriented .  No focal neurologic deficit. Extremities.  1+ LE edema,  pulses intact and symmetrical.  Data Reviewed: Prior data reviewed  Family Communication: Primary team is communicating with family  Time spent: 43 minutes.  This record has been created using Conservation officer, historic buildings. Errors have been sought and corrected,but may not always be located. Such creation errors do not reflect on the standard of care.   Author: Luna Salinas, MD 11/05/2023 1:34 PM  For on call review [email or web address].

## 2023-11-05 NOTE — Plan of Care (Signed)
  Problem: Activity: Goal: Interest or engagement in leisure activities will improve Outcome: Progressing Goal: Imbalance in normal sleep/wake cycle will improve Outcome: Progressing   Problem: Coping: Goal: Will verbalize feelings Outcome: Progressing   Problem: Activity: Goal: Will verbalize the importance of balancing activity with adequate rest periods Outcome: Progressing

## 2023-11-05 NOTE — Progress Notes (Signed)
   11/05/23 1020  Psych Admission Type (Psych Patients Only)  Admission Status Voluntary  Psychosocial Assessment  Patient Complaints None  Eye Contact Fair  Facial Expression Animated  Affect Appropriate to circumstance  Speech Logical/coherent  Interaction Assertive  Motor Activity Unsteady  Appearance/Hygiene Unremarkable  Behavior Characteristics Cooperative  Mood Pleasant  Thought Chartered certified accountant of ideas  Content WDL  Delusions None reported or observed  Perception WDL  Hallucination None reported or observed  Judgment Impaired  Confusion Mild  Danger to Self  Current suicidal ideation? Denies  Agreement Not to Harm Self Yes  Description of Agreement verbal  Danger to Others  Danger to Others None reported or observed

## 2023-11-05 NOTE — BHH Counselor (Signed)
 CSW spoke with pt's son Stephanie Anthony ( [phone]) who reports he will not be coming in to [facility] until tomorrow.  He reports he has no concerns regarding pt's discharge.   He reports he would like his mother to call him once she gets back home.   CSW will inform provider.   Derrill Flirt, MSW, [place] 11/05/2023 9:12 AM

## 2023-11-05 NOTE — Progress Notes (Signed)
 Patient denies SI/I/AVH at this time. Discharge instructions, AVS, prescriptions, and transition record reviewed with patient. Patient agrees to comply with medication management, follow-up visit and outpatient therapy. Patient belongings returned to patient. Patient questions and concerns addressed and answered.  Patient is being transported off the unit via wheelchair. Patient is being transported home by her friend.

## 2023-11-05 NOTE — Discharge Summary (Signed)
 Physician Discharge Summary Note  Patient:  Stephanie Anthony is an 89 y.o., female MRN:  [id number] DOB:  1933-05-31 Patient phone:  [phone] (home)  Patient address:   [address] [place] [address],    Date of Admission:  10/28/2023 Date of Discharge: 11/05/23  Reason for Admission:  Patient is a 89 y/o WF presents to ED with worsening hallucinations, mania. She reports her psychiatrist told her to stop taking her aripiprazole  recently due to kidney function concerns. However, review of medical records indicates this medication was discontinued 09/29/22 due to TD and Parkinson symptoms. The initial plan documented was to increase lamotrigine  if needed; however, appears she has been fairly stable on lamotrigine  current dose since discontinue of aripiprazole  until recently; she is noted to have a UTI which most likely has contributed to recent mania episode. Patient is admitted to [facility] unit with Q15 min safety monitoring. Multidisciplinary team approach is offered. Medication management; group/milieu therapy is offered.   Principal Problem: Bipolar disorder [facility]) Discharge Diagnoses: Principal Problem:   Bipolar disorder (HCC) Active Problems:   Hypothyroidism   CKD (chronic kidney disease), stage IV (HCC)   Hypertension   Peripheral edema   Past Psychiatric History: see h&p  Family Psychiatric  History: see h&p Social History:  Social History   Substance and Sexual Activity  Alcohol Use No   Alcohol/week: 0.0 standard drinks of alcohol     Social History   Substance and Sexual Activity  Drug Use No    Social History   Socioeconomic History   Marital status: Widowed    Spouse name: Not on file   Number of children: 2   Years of education: Not on file   Highest education level: 6th grade  Occupational History   Not on file  Tobacco Use   Smoking status: Former   Smokeless tobacco: Never   Tobacco comments:    quit 1964  Vaping Use   Vaping  status: Never Used  Substance and Sexual Activity   Alcohol use: No    Alcohol/week: 0.0 standard drinks of alcohol   Drug use: No   Sexual activity: Not Currently  Other Topics Concern   Not on file  Social History Narrative   Lives at home alone. Son lives in [place].   Social Drivers of Corporate investment banker Strain: Low Risk  (08/05/2022)   Received from [facility]   Overall Financial Resource Strain (CARDIA)    Difficulty of Paying Living Expenses: Not hard at all  Food Insecurity: No Food Insecurity (10/28/2023)   Hunger Vital Sign    Worried About Running Out of Food in the Last Year: Never true    Ran Out of Food in the Last Year: Never true  Transportation Needs: No Transportation Needs (10/28/2023)   PRAPARE - Administrator, Civil Service (Medical): No    Lack of Transportation (Non-Medical): No  Physical Activity: Not on file  Stress: Not on file  Social Connections: Socially Isolated (10/28/2023)   Social Connection and Isolation Panel    Frequency of Communication with Friends and Family: More than three times a week    Frequency of Social Gatherings with Friends and Family: More than three times a week    Attends Religious Services: Never    Database administrator or Organizations: No    Attends Banker Meetings: Never    Marital Status: Widowed   Past Medical History:  Past Medical History:  Diagnosis Date   Arthritis    Bipolar 1 disorder (HCC)    Cataracts, bilateral    Chronic kidney disease    stage 3   Chronic kidney disease    Chronic mitral valve disease    Coronary artery disease    Deviated nasal septum    Erosive esophagitis    Erosive esophagitis    Esophageal stricture    Fibroid tumor    GERD (gastroesophageal reflux disease)    History of hiatal hernia    Hypertension    Hypothyroidism    Murmur, cardiac    Nephrogenic diabetes insipidus (HCC)    Pure hypercholesterolemia    Renal  cyst, left    bilateral   Seasonal allergies     Past Surgical History:  Procedure Laterality Date   CARDIAC CATHETERIZATION     CHOLECYSTECTOMY     COLONOSCOPY WITH PROPOFOL  N/A 06/01/2018   Procedure: COLONOSCOPY WITH PROPOFOL ;  Surgeon: Deveron Fly, MD;  Location: [facility] ENDOSCOPY;  Service: Endoscopy;  Laterality: N/A;   ESOPHAGEAL DILATION     ESOPHAGOGASTRODUODENOSCOPY (EGD) WITH PROPOFOL  N/A 10/20/2014   Procedure: ESOPHAGOGASTRODUODENOSCOPY (EGD) WITH PROPOFOL ;  Surgeon: Deveron Fly, MD;  Location: [facility] ENDOSCOPY;  Service: Endoscopy;  Laterality: N/A;   ESOPHAGOGASTRODUODENOSCOPY (EGD) WITH PROPOFOL  N/A 06/01/2018   Procedure: ESOPHAGOGASTRODUODENOSCOPY (EGD) WITH PROPOFOL ;  Surgeon: Deveron Fly, MD;  Location: [facility] ENDOSCOPY;  Service: Endoscopy;  Laterality: N/A;   INFECTED SKIN DEBRIDEMENT     on back several years ago   JOINT REPLACEMENT     KNEE ARTHROPLASTY Left 06/10/2023   Procedure: COMPUTER ASSISTED TOTAL KNEE ARTHROPLASTY;  Surgeon: Arlyne Lame, MD;  Location: [facility] ORS;  Service: Orthopedics;  Laterality: Left;   knee replacement, right     LAPAROSCOPIC APPENDECTOMY N/A 06/21/2018   Procedure: APPENDECTOMY LAPAROSCOPIC;  Surgeon: Eldred Grego, MD;  Location: [facility] ORS;  Service: General;  Laterality: N/A;   NOSE SURGERY     THYROIDECTOMY     TOTAL THYROIDECTOMY     Family History:  Family History  Problem Relation Age of Onset   Heart Problems Mother    Seizures Mother    Stroke Mother    Diverticulitis Mother    Alcohol abuse Father    Heart attack Father    Heart attack Sister    Pancreatitis Sister    Cancer Sister    Cancer [facility] Course:  Patient is a 89 y/o WF presents to ED with worsening hallucinations, mania. She reports her psychiatrist told her to stop taking her aripiprazole  recently due to kidney function concerns. However, review of medical records indicates this medication was discontinued 09/29/22 due to  TD and Parkinson symptoms. The initial plan documented was to increase lamotrigine  if needed; however, appears she has been fairly stable on lamotrigine  current dose since discontinue of aripiprazole  until recently; she is noted to have a UTI which most likely has contributed to recent mania episode. Patient is admitted to [facility] unit with Q15 min safety monitoring. Multidisciplinary team approach is offered. Medication management; group/milieu therapy is offered.   Patient patient was noted to be on Abilify  2 mg daily.  On admission patient is observed to be severely manic, disinhibited behavior with poor personal boundaries with peers and staff making inappropriate comments, hyperverbal, tangential, very difficult to engage in interview and difficult to redirect.  She was having problems with sleep.  Patient Abilify  was discontinued in the emergency room  and started on Seroquel  25 mg in the morning and 50 mg nightly which was titrated up to Seroquel  25 mg in the morning and 100 mg nightly.  Patient's home medication Lamictal  100 mg nightly was increased to Lamictal  25 mg every morning and 100 mg nightly.  Patient tolerated the medication very well and her insomnia has improved significantly.  Patient had no aggressive behaviors but her intrusiveness have improved, racing thoughts resolved and patient is redirectable.  She has consistently denied SI/HI/intent/plan.  She denied auditory/visual hallucinations.  She had pedal edema with skin breakage.  Hospitalist consult was called and on the day of discharge they did the following recommendations:6/19: Hemodynamically stable, creatinine at 2.08, mostly stable.  She did received p.o. Lasix  yesterday before discontinuation.  We will hold further diuresis.lower extremity edema seems much improved but still 1+.  she was not wearing any compression stockings although ordered 2 days ago.Patient is being discharged and  She should be wearing compression stockings  during the day and keeping her legs elevated while resting.  On the day of discharge patient consistently denies SI/HI/plan and remains future oriented and willing to participate in outpatient mental health services.  Patient's son worked with the treatment team throughout this hospitalization and he was updated of the treatment and postdischarge planning.   Physical Findings: AIMS:  , ,  ,  ,    CIWA:    COWS:        Psychiatric Specialty Exam:  Presentation  General Appearance:  Appropriate for Environment; Casual  Eye Contact: Fair  Speech: Clear and Coherent  Speech Volume: Normal    Mood and Affect  Mood: Euthymic  Affect: Appropriate   Thought Process  Thought Processes: Coherent  Descriptions of Associations:Intact  Orientation:Full (Time, Place and Person)  Thought Content:Logical  Hallucinations:Hallucinations: None  Ideas of Reference:None  Suicidal Thoughts:Suicidal Thoughts: No  Homicidal Thoughts:Homicidal Thoughts: No   Sensorium  Memory: Immediate Fair; Remote Fair; Recent Fair  Judgment: Fair  Insight: Fair   Art therapist  Concentration: Fair  Attention Span: Fair  Recall: [employer] of Knowledge: Fair  Language: Fair   Psychomotor Activity  Psychomotor Activity: Psychomotor Activity: Normal  Musculoskeletal: Strength & Muscle Tone: within normal limits Gait & Station: normal Assets  Assets: Manufacturing systems engineer; Desire for Improvement; Social Support; Resilience   Sleep  Sleep: Sleep: Fair    Physical Exam: Physical Exam ROS Blood pressure 101/69, pulse 78, temperature 97.6 F (36.4 C), resp. rate 16, height 5' 7 (1.702 m), weight 80.3 kg, SpO2 100%. Body mass index is 27.72 kg/m.   Social History   Tobacco Use  Smoking Status Former  Smokeless Tobacco Never  Tobacco Comments   quit 1964   Tobacco Cessation:  N/A, patient does not currently use tobacco products   Blood  Alcohol level:  Lab Results  Component Value Date   ETH <15 10/27/2023   ETH <5 07/07/2015    Metabolic Disorder Labs:  Lab Results  Component Value Date   HGBA1C 5.8 (H) 10/27/2023   MPG 119.76 10/27/2023   No results found for: PROLACTIN No results found for: CHOL, TRIG, HDL, CHOLHDL, VLDL, LDLCALC  See Psychiatric Specialty Exam and Suicide Risk Assessment completed by Attending Physician prior to discharge.  Discharge destination:  Home  Is patient on multiple antipsychotic therapies at discharge:  No   Has Patient had three or more failed trials of antipsychotic monotherapy by history:  No  Recommended Plan for Multiple Antipsychotic Therapies: NA  Discharge Instructions     Diet - low sodium heart healthy   Complete by: As directed    Increase activity slowly   Complete by: As directed       Allergies as of 11/05/2023       Reactions   Aspirin  Other (See Comments)   Rectal Bleeding   Prednisone Other (See Comments)   Hallucinations   Codeine Nausea And Vomiting   Valium [diazepam] Other (See Comments)   Uknown        Medication List     STOP taking these medications    lovastatin 40 MG tablet Commonly known as: MEVACOR Replaced by: pravastatin  10 MG tablet       TAKE these medications      Indication  ARIPiprazole  2 MG tablet Commonly known as: Abilify  Take 1 tablet (2 mg total) by mouth at bedtime.  Indication: Manic Phase of Manic-Depression   hydrocortisone 2.5 % rectal cream Commonly known as: ANUSOL-HC Place rectally 4 (four) times daily.    lamoTRIgine  25 MG tablet Commonly known as: LAMICTAL  Take 1 tablet (25 mg total) by mouth daily. What changed: You were already taking a medication with the same name, and this prescription was added. Make sure you understand how and when to take each.    lamoTRIgine  100 MG tablet Commonly known as: LAMICTAL  Take 1 tablet (100 mg total) by mouth at bedtime. What changed:  Another medication with the same name was added. Make sure you understand how and when to take each.  Indication: Manic-Depression   latanoprost  0.005 % ophthalmic solution Commonly known as: XALATAN  Place 1 drop into both eyes at bedtime.    levothyroxine  112 MCG tablet Commonly known as: SYNTHROID  Take 1 tablet (112 mcg total) by mouth daily at 6 (six) AM. Start taking on: November 06, 2023 What changed:  medication strength how much to take when to take this    losartan  25 MG tablet Commonly known as: COZAAR  Take 25 mg by mouth daily.    pantoprazole  40 MG tablet Commonly known as: PROTONIX  Take 40 mg by mouth daily.    pravastatin  10 MG tablet Commonly known as: PRAVACHOL  Take 1 tablet (10 mg total) by mouth daily at 6 PM. Replaces: lovastatin 40 MG tablet    QUEtiapine  100 MG tablet Commonly known as: SEROQUEL  Take 1 tablet (100 mg total) by mouth at bedtime.  Indication: Manic Phase of Manic-Depression   QUEtiapine  25 MG tablet Commonly known as: SEROQUEL  Take 1 tablet (25 mg total) by mouth daily with breakfast.  Indication: Manic Phase of Manic-Depression        Follow-up Information     [facility]  Follow up on 11/19/2023.   Why: Your appointment is scheduled for 11/19/23 at 1:30 PM with your provider, Dr.Hisada                Follow-up recommendations:  Activity:  As tolerated    Signed: Karn Derk, MD 11/05/2023, 9:33 AM

## 2023-11-05 NOTE — Group Note (Signed)
 Date:  11/05/2023 Time:  10:50 AM  Group Topic/Focus:  Fresh air Therapy Outdoors,    Participation Level:  Active  Participation Quality:  Appropriate  Affect:  Appropriate  Cognitive:  Appropriate  Insight: Appropriate  Engagement in Group:  Engaged  Modes of Intervention:  Socialization  Additional Comments:  none  Merton Abts 11/05/2023, 10:50 AM

## 2023-11-05 NOTE — BHH Suicide Risk Assessment (Signed)
 [facility] Discharge Suicide Risk Assessment   Principal Problem: Bipolar disorder [facility]) Discharge Diagnoses: Principal Problem:   Bipolar disorder (HCC) Active Problems:   Hypothyroidism   CKD (chronic kidney disease), stage IV (HCC)   Hypertension   Peripheral edema   Total Time spent with patient: 30 minutes  Musculoskeletal: Strength & Muscle Tone: within normal limits Gait & Station: unsteady Patient leans: N/A  Psychiatric Specialty Exam  Presentation  General Appearance:  Appropriate for Environment; Casual  Eye Contact: Fair  Speech: Clear and Coherent  Speech Volume: Normal  Handedness: Right   Mood and Affect  Mood: Euthymic  Duration of Depression Symptoms: Greater than two weeks  Affect: Appropriate   Thought Process  Thought Processes: Coherent  Descriptions of Associations:Intact  Orientation:Full (Time, Place and Person)  Thought Content:Logical  History of Schizophrenia/Schizoaffective disorder:No  Duration of Psychotic Symptoms:N/A  Hallucinations:Hallucinations: None  Ideas of Reference:None  Suicidal Thoughts:Suicidal Thoughts: No  Homicidal Thoughts:Homicidal Thoughts: No   Sensorium  Memory: Immediate Fair; Remote Fair; Recent Fair  Judgment: Fair  Insight: Fair   Art therapist  Concentration: Fair  Attention Span: Fair  Recall: [employer] of Knowledge: Fair  Language: Fair   Psychomotor Activity  Psychomotor Activity: Psychomotor Activity: Normal   Assets  Assets: Communication Skills; Desire for Improvement; Social Support; Resilience   Sleep  Sleep: Sleep: Fair  Estimated Sleeping Duration (Last 24 Hours): 5.50-5.75 hours  Physical Exam: Physical Exam ROS Blood pressure 101/69, pulse 78, temperature 97.6 F (36.4 C), resp. rate 16, height 5' 7 (1.702 m), weight 80.3 kg, SpO2 100%. Body mass index is 27.72 kg/m.  Mental Status Per Nursing Assessment::   On Admission:   NA  Demographic Factors:  Caucasian and Living alone  Loss Factors: Decrease in vocational status  Historical Factors: Impulsivity  Risk Reduction Factors:   Sense of responsibility to family, Positive social support, Positive therapeutic relationship, and Positive coping skills or problem solving skills  Continued Clinical Symptoms:  Bipolar Disorder:   Bipolar II  Cognitive Features That Contribute To Risk:  None    Suicide Risk:  Minimal: No identifiable suicidal ideation.  Patients presenting with no risk factors but with morbid ruminations; may be classified as minimal risk based on the severity of the depressive symptoms   Follow-up Information     [facility]  Follow up on 11/19/2023.   Why: Your appointment is scheduled for 11/19/23 at 1:30 PM with your provider, Dr.Hisada                Plan Of Care/Follow-up recommendations:  Activity:  As tolerated  Aurelia Blotter, MD 11/05/2023, 9:31 AM

## 2023-11-05 NOTE — Progress Notes (Signed)
 X-large compression stockings applied to BLE prior to discharge. Patient education and reinforcement provided

## 2023-11-05 NOTE — Progress Notes (Signed)
  [facility] Adult Case Management Discharge Plan :  Will you be returning to the same living situation after discharge:  Yes,  pt will return home  At discharge, do you have transportation home?: Yes,  CSW will assist with appropriate discharge planning  Do you have the ability to pay for your medications: Yes,  MEDICARE / MEDICARE PART A AND B  Release of information consent forms completed and in the chart;  Patient's signature needed at discharge.  Patient to Follow up at:  Follow-up Information     [facility]  Follow up on 11/19/2023.   Why: Your appointment is scheduled for 11/19/23 at 1:30 PM with your provider, Dr.Hisada                Next level of care provider has access to [facility] Link:no  Safety Planning and Suicide Prevention discussed: Yes,  Jackqulyn Mendel, son, [phone]      Has patient been referred to the Quitline?: Patient does not use tobacco/nicotine products  Patient has been referred for addiction treatment: No known substance use disorder.  [address] LCSWA 11/05/2023, 10:47 AM

## 2023-11-05 NOTE — Care Management Important Message (Signed)
 Important Message  Patient Details  Name: Stephanie Anthony MRN: [id number] Date of Birth: 13-Jul-1933   Important Message Given:  Yes - Medicare IM     Claudio Culver, LCSWA 11/05/2023, 10:53 AM

## 2023-11-10 ENCOUNTER — Telehealth: Payer: Self-pay | Admitting: Psychiatry

## 2023-11-10 ENCOUNTER — Emergency Department
Admission: EM | Admit: 2023-11-10 | Discharge: 2023-11-11 | Disposition: A | Discharge status: Other Acute Inpatient Hospital | Source: Home / Self Care

## 2023-11-10 ENCOUNTER — Other Ambulatory Visit: Payer: Self-pay

## 2023-11-10 ENCOUNTER — Telehealth: Payer: Self-pay

## 2023-11-10 DIAGNOSIS — N184 Chronic kidney disease, stage 4 (severe): Secondary | ICD-10-CM | POA: Insufficient documentation

## 2023-11-10 DIAGNOSIS — K644 Residual hemorrhoidal skin tags: Secondary | ICD-10-CM | POA: Insufficient documentation

## 2023-11-10 DIAGNOSIS — F312 Bipolar disorder, current episode manic severe with psychotic features: Secondary | ICD-10-CM | POA: Insufficient documentation

## 2023-11-10 DIAGNOSIS — L97919 Non-pressure chronic ulcer of unspecified part of right lower leg with unspecified severity: Secondary | ICD-10-CM | POA: Insufficient documentation

## 2023-11-10 DIAGNOSIS — R441 Visual hallucinations: Secondary | ICD-10-CM | POA: Insufficient documentation

## 2023-11-10 DIAGNOSIS — F99 Mental disorder, not otherwise specified: Secondary | ICD-10-CM

## 2023-11-10 DIAGNOSIS — I2510 Atherosclerotic heart disease of native coronary artery without angina pectoris: Secondary | ICD-10-CM | POA: Insufficient documentation

## 2023-11-10 DIAGNOSIS — K648 Other hemorrhoids: Secondary | ICD-10-CM | POA: Insufficient documentation

## 2023-11-10 DIAGNOSIS — L97929 Non-pressure chronic ulcer of unspecified part of left lower leg with unspecified severity: Secondary | ICD-10-CM | POA: Insufficient documentation

## 2023-11-10 DIAGNOSIS — J449 Chronic obstructive pulmonary disease, unspecified: Secondary | ICD-10-CM | POA: Insufficient documentation

## 2023-11-10 DIAGNOSIS — R443 Hallucinations, unspecified: Secondary | ICD-10-CM

## 2023-11-10 DIAGNOSIS — G4700 Insomnia, unspecified: Secondary | ICD-10-CM | POA: Insufficient documentation

## 2023-11-10 HISTORY — DX: Anxiety disorder, unspecified: F41.9

## 2023-11-10 HISTORY — DX: Chronic obstructive pulmonary disease, unspecified: J44.9

## 2023-11-10 LAB — COMPREHENSIVE METABOLIC PANEL WITH GFR
ALT: 15 U/L (ref 0–44)
AST: 21 U/L (ref 15–41)
Albumin: 3.4 g/dL — ABNORMAL LOW (ref 3.5–5.0)
Alkaline Phosphatase: 53 U/L (ref 38–126)
Anion gap: 9 (ref 5–15)
BUN: 32 mg/dL — ABNORMAL HIGH (ref 8–23)
CO2: 21 mmol/L — ABNORMAL LOW (ref 22–32)
Calcium: 9.1 mg/dL (ref 8.9–10.3)
Chloride: 110 mmol/L (ref 98–111)
Creatinine, Ser: 1.6 mg/dL — ABNORMAL HIGH (ref 0.44–1.00)
GFR, Estimated: 31 mL/min — ABNORMAL LOW (ref >60)
Glucose, Bld: 111 mg/dL — ABNORMAL HIGH (ref 70–99)
Potassium: 4.5 mmol/L (ref 3.5–5.1)
Sodium: 140 mmol/L (ref 135–145)
Total Bilirubin: 0.6 mg/dL (ref 0.0–1.2)
Total Protein: 6.7 g/dL (ref 6.5–8.1)

## 2023-11-10 LAB — ETHANOL: Alcohol, Ethyl (B): <15 mg/dL (ref <15)

## 2023-11-10 LAB — URINE DRUG SCREEN, QUALITATIVE (ARMC ONLY)
Amphetamines, Ur Screen: NOT DETECTED
Barbiturates, Ur Screen: NOT DETECTED
Benzodiazepine, Ur Scrn: NOT DETECTED
Cannabinoid 50 Ng, Ur ~~LOC~~: NOT DETECTED
Cocaine Metabolite,Ur ~~LOC~~: NOT DETECTED
MDMA (Ecstasy)Ur Screen: NOT DETECTED
Methadone Scn, Ur: NOT DETECTED
Opiate, Ur Screen: NOT DETECTED
Phencyclidine (PCP) Ur S: NOT DETECTED
Tricyclic, Ur Screen: NOT DETECTED

## 2023-11-10 LAB — CBC
HCT: 31.9 % — ABNORMAL LOW (ref 36.0–46.0)
Hemoglobin: 10.3 g/dL — ABNORMAL LOW (ref 12.0–15.0)
MCH: 24.6 pg — ABNORMAL LOW (ref 26.0–34.0)
MCHC: 32.3 g/dL (ref 30.0–36.0)
MCV: 76.1 fL — ABNORMAL LOW (ref 80.0–100.0)
Platelets: 224 10*3/uL (ref 150–400)
RBC: 4.19 MIL/uL (ref 3.87–5.11)
RDW: 15 % (ref 11.5–15.5)
WBC: 7.2 10*3/uL (ref 4.0–10.5)
nRBC: 0 % (ref 0.0–0.2)

## 2023-11-10 NOTE — Telephone Encounter (Signed)
 I've alerted the front desk to add her to the waiting list for an earlier appointment. In the meantime, please advise her to seek emergency resources if any urgent concerns arise.

## 2023-11-10 NOTE — ED Notes (Signed)
 Pt given snack at this time. Pt calm and cooperative.

## 2023-11-10 NOTE — Telephone Encounter (Signed)
 Called to offer patient appointment 11-12-23 sooner than 11-19-23. She stated that she could not wait till then. She was going to have a nervous breakdown and cannot wait. She believes she needs to be admitted back over at the behavioral hospital. Advised her to go to [facility] ED to be admitted. She is calling her son to let him know what is going on and she is going to the ED.

## 2023-11-10 NOTE — BH Assessment (Signed)
 This Clinical research associate contacted [facility] via phone to request an assessment for this patient, request has been made, assessment is currently pending.

## 2023-11-10 NOTE — ED Notes (Signed)
 Assisted ambulating pt to the bathroom and collected urine specimen at this time. Pt provided with blankets at this time.

## 2023-11-10 NOTE — ED Provider Notes (Signed)
 [facility] Provider Note    Event Date/Time   First MD Initiated Contact with Patient 11/10/23 1844     (approximate)   History   Anxiety and behavioral eval  Pt to ED for I'm having a nervous breakdown. Was taken off Abilify  about 3 weeks ago and states since then has been very anxious. Not sleeping enough. Hx bipolar.   Also states legs hurting and talking nonstop and having hallucinations since coming off Abilify . Lower legs are tight and swollen with red areas. Denies CHF.   Asking to be admitted to [facility]. Denies SI.   After triage pt told triage RN that she is ALSO having rectal bleeding with mucous since several months ago. Hx bleeding hemorrhoids.  Left glasses and Depends on pt.   HPI Stephanie Anthony is a 89 y.o. female PMH bipolar disorder, hyperlipidemia, CKD, COPD, CAD presents for evaluation of reported psychiatric problem -Patient self presented requesting to be admitted to behavioral health service.  Notes that she is having a nervous breakdown.  Stopped Abilify  about 3 weeks ago and has been feeling very anxious since then.  Not sleeping well. - Patient is a limited wandering historian.  Tells me she cannot stop talking and that she is hallucinating.  Recently thought a doorknob was a snake and tells me that she sees a spider on the wall as we are speaking. - Separately does complain of some chronic hemorrhoids with mild rectal bleeding  Per chart review, patient [facility] 10/28/2023-11/05/2023.  Admitted for worsening hallucinations, mania.     Physical Exam   Triage Vital Signs: ED Triage Vitals  Encounter Vitals Group     BP 11/10/23 1812 (!) 156/80     Girls Systolic BP Percentile --      Girls Diastolic BP Percentile --      Boys Systolic BP Percentile --      Boys Diastolic BP Percentile --      Pulse Rate 11/10/23 1812 85     Resp 11/10/23 1812 20     Temp 11/10/23 1812 98.4 F  (36.9 C)     Temp Source 11/10/23 1812 Oral     SpO2 11/10/23 1812 99 %     Weight 11/10/23 1813 171 lb (77.6 kg)     Height 11/10/23 1813 5' 4 (1.626 m)     Head Circumference --      Peak Flow --      Pain Score 11/10/23 1808 5     Pain Loc --      Pain Education --      Exclude from Growth Chart --     Most recent vital signs: Vitals:   11/10/23 1812  BP: (!) 156/80  Pulse: 85  Resp: 20  Temp: 98.4 F (36.9 C)  SpO2: 99%     General: Awake, no distress.  CV:  Good peripheral perfusion. RRR, RP 2+ Resp:  Normal effort. CTAB Abd:  No distention. Nontender to deep palpation throughout Rectal:  Small internal/external hemorrhoids noted, no blood appreciated.  No masses. Psych:  Hyperverbal, denies SI, HI.  Does endorse visual hallucinations and does appear to be responding to internal stimuli at times.    ED Results / Procedures / Treatments   Labs (all labs ordered are listed, but only abnormal results are displayed) Labs Reviewed  COMPREHENSIVE METABOLIC PANEL WITH GFR - Abnormal; Notable for the following components:      Result  Value   CO2 21 (*)    Glucose, Bld 111 (*)    BUN 32 (*)    Creatinine, Ser 1.60 (*)    Albumin 3.4 (*)    GFR, Estimated 31 (*)    All other components within normal limits  CBC - Abnormal; Notable for the following components:   Hemoglobin 10.3 (*)    HCT 31.9 (*)    MCV 76.1 (*)    MCH 24.6 (*)    All other components within normal limits  ETHANOL  URINE DRUG SCREEN, QUALITATIVE ([facility] ONLY)     EKG  Ecg = sinus rhythm, rate 71, no gross ST elevation or depression (except for possible trace elevation in inferior leads that appears stable from multiple prior EKGs), no significant repolarization abnormality, normal axis, normal intervals.  No clear evidence of ischemia no arrhythmia on my interpretation.   RADIOLOGY N/a    PROCEDURES:  Critical Care performed: No  Procedures   MEDICATIONS ORDERED IN  ED: Medications - No data to display   IMPRESSION / MDM / ASSESSMENT AND PLAN / ED COURSE  I reviewed the triage vital signs and the nursing notes.                              DDX/MDM/AP: Differential diagnosis includes, but is not limited to, primary psychiatric disorder, doubt underlying organic etiology.  No clinical concern for acute intracranial pathology at this time.  Plan: - Labs - EKG - Plan for psychiatric eval  Patient's presentation is most consistent with acute presentation with potential threat to life or bodily function.   ED course below.  Patient medically cleared.  Remains calm and cooperative here in emergency department.  Psychiatry consulted, recommendations pending.  Placed on voluntary psych hold.  Clinical Course as of 11/10/23 2031  Tue Nov 10, 2023  2028 CBC and CMP at baseline EtOH negative UDS negative [MM]    Clinical Course User Index [MM] Clarine Ozell LABOR, MD     FINAL CLINICAL IMPRESSION(S) / ED DIAGNOSES   Final diagnoses:  Psychiatric problem  Hallucinations     Rx / DC Orders   ED Discharge Orders     None        Note:  This document was prepared using Dragon voice recognition software and may include unintentional dictation errors.   Clarine Ozell LABOR, MD 11/10/23 2031

## 2023-11-10 NOTE — Telephone Encounter (Signed)
 Amy with [facility] called stating that the patient is still not doing well she is still talking a lot and also still hallucinating she would like to know if patient can come in for a sooner appointment please advise   Amy with [facility] [phone]

## 2023-11-10 NOTE — ED Triage Notes (Addendum)
 Pt to ED for I'm having a nervous breakdown. Was taken off Abilify  about 3 weeks ago and states since then has been very anxious. Not sleeping enough. Hx bipolar.   Also states legs hurting and talking nonstop and having hallucinations since coming off Abilify . Lower legs are tight and swollen with red areas. Denies CHF.   Asking to be admitted to [facility]. Denies SI.   After triage pt told triage RN that she is ALSO having rectal bleeding with mucous since several months ago. Hx bleeding hemorrhoids.  Left glasses and Depends on pt.

## 2023-11-10 NOTE — ED Notes (Signed)
 Pt given cup of water

## 2023-11-10 NOTE — ED Notes (Signed)
 Pt taken out of hallway to private room with MD and EDT to perform rectal exam.

## 2023-11-11 ENCOUNTER — Encounter: Payer: Self-pay | Admitting: Psychiatry

## 2023-11-11 ENCOUNTER — Inpatient Hospital Stay
Admission: AD | Admit: 2023-11-11 | Discharge: 2023-11-15 | DRG: 885 | Disposition: A | Discharge status: Home / Self Care | Source: Intra-hospital | Attending: Psychiatry | Admitting: Psychiatry

## 2023-11-11 DIAGNOSIS — Z1152 Encounter for screening for COVID-19: Secondary | ICD-10-CM | POA: Diagnosis not present

## 2023-11-11 DIAGNOSIS — I872 Venous insufficiency (chronic) (peripheral): Secondary | ICD-10-CM | POA: Diagnosis present

## 2023-11-11 DIAGNOSIS — Z823 Family history of stroke: Secondary | ICD-10-CM

## 2023-11-11 DIAGNOSIS — Z604 Social exclusion and rejection: Secondary | ICD-10-CM | POA: Diagnosis present

## 2023-11-11 DIAGNOSIS — Z79899 Other long term (current) drug therapy: Secondary | ICD-10-CM | POA: Diagnosis not present

## 2023-11-11 DIAGNOSIS — I129 Hypertensive chronic kidney disease with stage 1 through stage 4 chronic kidney disease, or unspecified chronic kidney disease: Secondary | ICD-10-CM | POA: Diagnosis present

## 2023-11-11 DIAGNOSIS — R441 Visual hallucinations: Secondary | ICD-10-CM | POA: Diagnosis present

## 2023-11-11 DIAGNOSIS — K219 Gastro-esophageal reflux disease without esophagitis: Secondary | ICD-10-CM | POA: Diagnosis present

## 2023-11-11 DIAGNOSIS — F3111 Bipolar disorder, current episode manic without psychotic features, mild: Secondary | ICD-10-CM | POA: Diagnosis not present

## 2023-11-11 DIAGNOSIS — Z555 Less than a high school diploma: Secondary | ICD-10-CM | POA: Diagnosis not present

## 2023-11-11 DIAGNOSIS — E890 Postprocedural hypothyroidism: Secondary | ICD-10-CM | POA: Diagnosis present

## 2023-11-11 DIAGNOSIS — J449 Chronic obstructive pulmonary disease, unspecified: Secondary | ICD-10-CM | POA: Diagnosis present

## 2023-11-11 DIAGNOSIS — E8809 Other disorders of plasma-protein metabolism, not elsewhere classified: Secondary | ICD-10-CM | POA: Diagnosis present

## 2023-11-11 DIAGNOSIS — Z96653 Presence of artificial knee joint, bilateral: Secondary | ICD-10-CM | POA: Diagnosis present

## 2023-11-11 DIAGNOSIS — I2510 Atherosclerotic heart disease of native coronary artery without angina pectoris: Secondary | ICD-10-CM | POA: Diagnosis present

## 2023-11-11 DIAGNOSIS — F312 Bipolar disorder, current episode manic severe with psychotic features: Secondary | ICD-10-CM | POA: Diagnosis present

## 2023-11-11 DIAGNOSIS — Z886 Allergy status to analgesic agent status: Secondary | ICD-10-CM | POA: Diagnosis not present

## 2023-11-11 DIAGNOSIS — Z811 Family history of alcohol abuse and dependence: Secondary | ICD-10-CM | POA: Diagnosis not present

## 2023-11-11 DIAGNOSIS — E7800 Pure hypercholesterolemia, unspecified: Secondary | ICD-10-CM | POA: Diagnosis present

## 2023-11-11 DIAGNOSIS — N184 Chronic kidney disease, stage 4 (severe): Secondary | ICD-10-CM | POA: Diagnosis present

## 2023-11-11 DIAGNOSIS — G4700 Insomnia, unspecified: Secondary | ICD-10-CM | POA: Diagnosis present

## 2023-11-11 DIAGNOSIS — Z8249 Family history of ischemic heart disease and other diseases of the circulatory system: Secondary | ICD-10-CM

## 2023-11-11 DIAGNOSIS — F319 Bipolar disorder, unspecified: Secondary | ICD-10-CM | POA: Diagnosis not present

## 2023-11-11 DIAGNOSIS — F3112 Bipolar disorder, current episode manic without psychotic features, moderate: Secondary | ICD-10-CM | POA: Diagnosis not present

## 2023-11-11 DIAGNOSIS — K649 Unspecified hemorrhoids: Secondary | ICD-10-CM | POA: Diagnosis present

## 2023-11-11 DIAGNOSIS — Z87891 Personal history of nicotine dependence: Secondary | ICD-10-CM | POA: Diagnosis not present

## 2023-11-11 DIAGNOSIS — Z79890 Hormone replacement therapy (postmenopausal): Secondary | ICD-10-CM | POA: Diagnosis not present

## 2023-11-11 LAB — RESP PANEL BY RT-PCR (RSV, FLU A&B, COVID)  RVPGX2
Influenza A by PCR: NEGATIVE
Influenza B by PCR: NEGATIVE
Resp Syncytial Virus by PCR: NEGATIVE
SARS Coronavirus 2 by RT PCR: NEGATIVE

## 2023-11-11 MED ORDER — ACETAMINOPHEN 325 MG PO TABS
650.0000 mg | ORAL_TABLET | Freq: Four times a day (QID) | ORAL | Status: DC | PRN
  Administered 2023-11-11 – 2023-11-15 (×8): 650 mg via ORAL
  Filled 2023-11-11 (×8): qty 2

## 2023-11-11 MED ORDER — ALUM & MAG HYDROXIDE-SIMETH 200-200-20 MG/5ML PO SUSP
30.0000 mL | ORAL | Status: DC | PRN
  Administered 2023-11-12: 30 mL via ORAL
  Filled 2023-11-11: qty 30

## 2023-11-11 MED ORDER — LAMOTRIGINE 25 MG PO TABS
125.0000 mg | ORAL_TABLET | Freq: Every day | ORAL | Status: DC
Start: 2023-11-12 — End: 2023-11-15
  Administered 2023-11-12 – 2023-11-15 (×4): 125 mg via ORAL
  Filled 2023-11-11 (×4): qty 1

## 2023-11-11 MED ORDER — LEVOTHYROXINE SODIUM 112 MCG PO TABS
112.0000 ug | ORAL_TABLET | Freq: Every day | ORAL | Status: DC
  Administered 2023-11-12 – 2023-11-15 (×4): 112 ug via ORAL
  Filled 2023-11-11 (×4): qty 1

## 2023-11-11 MED ORDER — HYDROCORTISONE 1 % EX CREA
TOPICAL_CREAM | Freq: Two times a day (BID) | CUTANEOUS | Status: DC
  Filled 2023-11-11: qty 28

## 2023-11-11 MED ORDER — PRAVASTATIN SODIUM 10 MG PO TABS
10.0000 mg | ORAL_TABLET | Freq: Every day | ORAL | Status: DC
Start: 2023-11-11 — End: 2023-11-15
  Administered 2023-11-12 – 2023-11-14 (×3): 10 mg via ORAL
  Filled 2023-11-11 (×5): qty 1

## 2023-11-11 MED ORDER — OLANZAPINE 5 MG PO TABS
5.0000 mg | ORAL_TABLET | Freq: Every day | ORAL | Status: DC
  Administered 2023-11-11: 5 mg via ORAL
  Filled 2023-11-11: qty 1

## 2023-11-11 MED ORDER — LEVOTHYROXINE SODIUM 112 MCG PO TABS: 112.0000 ug | ORAL_TABLET | Freq: Every day | ORAL | Status: DC

## 2023-11-11 MED ORDER — OLANZAPINE 5 MG PO TABS
5.0000 mg | ORAL_TABLET | Freq: Once | ORAL | Status: AC
  Administered 2023-11-11: 5 mg via ORAL
  Filled 2023-11-11: qty 1

## 2023-11-11 MED ORDER — PRAVASTATIN SODIUM 20 MG PO TABS: 10.0000 mg | ORAL_TABLET | Freq: Every day | ORAL | Status: DC

## 2023-11-11 MED ORDER — OLANZAPINE 10 MG IM SOLR: 5.0000 mg | Freq: Three times a day (TID) | INTRAMUSCULAR | Status: DC | PRN

## 2023-11-11 MED ORDER — LATANOPROST 0.005 % OP SOLN
1.0000 [drp] | Freq: Every day | OPHTHALMIC | Status: DC
  Administered 2023-11-11 – 2023-11-14 (×4): 1 [drp] via OPHTHALMIC
  Filled 2023-11-11 (×2): qty 2.5

## 2023-11-11 MED ORDER — ACETAMINOPHEN 500 MG PO TABS
1000.0000 mg | ORAL_TABLET | Freq: Once | ORAL | Status: AC
  Administered 2023-11-11: 1000 mg via ORAL
  Filled 2023-11-11: qty 2

## 2023-11-11 MED ORDER — OLANZAPINE 5 MG PO TABS
5.0000 mg | ORAL_TABLET | Freq: Every day | ORAL | Status: DC
Start: 2023-11-11 — End: 2023-11-11

## 2023-11-11 MED ORDER — MAGNESIUM HYDROXIDE 400 MG/5ML PO SUSP: 15.0000 mL | Freq: Every day | ORAL | Status: DC | PRN

## 2023-11-11 MED ORDER — LATANOPROST 0.005 % OP SOLN
1.0000 [drp] | Freq: Every day | OPHTHALMIC | Status: DC
  Filled 2023-11-11: qty 2.5

## 2023-11-11 MED ORDER — LAMOTRIGINE 25 MG PO TABS
125.0000 mg | ORAL_TABLET | Freq: Every day | ORAL | Status: DC
  Administered 2023-11-11: 125 mg via ORAL
  Filled 2023-11-11: qty 1

## 2023-11-11 MED ORDER — OLANZAPINE 5 MG PO TBDP
5.0000 mg | ORAL_TABLET | Freq: Three times a day (TID) | ORAL | Status: DC | PRN
  Administered 2023-11-13: 5 mg via ORAL
  Filled 2023-11-11: qty 1

## 2023-11-11 NOTE — Plan of Care (Signed)
 Patient is a voluntary admission to [facility] for Bipolar - hallucinations.  Recently discharged from this unit for Bipolar mania with hallucinations.  Patient states on discharge she was no longer hallucinating but began hallucinations after two days. Patient states she was worried that she was given the wrong prescriptions since they were different and did not include the abilify .  Reminded patient that she was taken off abilify  d/t kidney injury and her medications changed and explained to her before discharge. When trying to assess patient she kept telling me her symptoms from the first admission on 6/10 but when redirected to the present symptoms she states hallucinations but could not say what she is seeing. States her goal her is to have a better relationship with her friends here. Denies SI, HI, anxiety and depression at this time.  Will continue to monitor.

## 2023-11-11 NOTE — ED Notes (Signed)
 Tech assisted pt to bathroom, helped change into new brief, and clean up her bottom. Blood noted on wipes. Doctor aware.
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
## 2023-11-11 NOTE — Progress Notes (Signed)
   11/11/23 2200  Psych Admission Type (Psych Patients Only)  Admission Status Voluntary  Psychosocial Assessment  Patient Complaints None  Eye Contact Fair  Facial Expression Animated  Affect Appropriate to circumstance  Speech Logical/coherent  Interaction Assertive  Motor Activity Unsteady;Slow  Appearance/Hygiene Unremarkable  Behavior Characteristics Cooperative;Appropriate to situation  Mood Pleasant  Thought Process  Coherency WDL  Content WDL  Delusions None reported or observed  Perception WDL  Hallucination None reported or observed  Judgment Impaired  Confusion Mild  Danger to Self  Current suicidal ideation? Denies  Agreement Not to Harm Self Yes  Description of Agreement verbal  Danger to Others  Danger to Others None reported or observed

## 2023-11-11 NOTE — Consult Note (Signed)
 [facility] Consult Note  Patient Name: Stephanie Anthony MRN: [id number] DOB: 1933/11/23 DATE OF Consult: 11/11/2023  PRIMARY PSYCHIATRIC DIAGNOSES  - Bipolar disorder, current episode manic with psychotic features  - Hallucinations -  History of Chronic kidney disease stage 4 - Lower extremity edema and ulceration  -  History of Rectal bleeding and hemorrhoids  - Insomnia   RECOMMENDATIONS  Admit to inpatient psych for safety and Stabilization. Medication recommendations:  - Discontinue Seroquel  as patient reports that this has not been effective. - Start Zyprexa  PO at bedtime for psychosis and mood stabilization. Can be tolerated with patient's current GFR of 33.  - Please stop all antipsychotic and QTc prolonging medications if patient's QTc is greater than 500 ms. - Continue Lamotrigine  125mg  PO daily for moo stabilization.  Non-Medication/therapeutic recommendations:  Refer to outpatient psychiatric provider for medication management and therapy upon discharge from inpatient psych admission.    Communication: Treatment team members (and family members if applicable) who were involved in treatment/care discussions and planning, and with whom we spoke or engaged with via secure text/chat, include the following: patient's treatment team.  Thank you for involving us  in the care of this patient. If you have any additional questions or concerns, please call [phone] and ask for me or the provider on-call.  TELEPSYCHIATRY ATTESTATION & CONSENT  As the provider for this telehealth consult, I attest that I verified the patient's identity using two separate identifiers, introduced myself to the patient, provided my credentials, disclosed my location, and performed this encounter via a HIPAA-compliant, real-time, face-to-face, two-way, interactive audio and video platform and with the full consent and agreement of the patient (or guardian as applicable.)  Patient physical  location: . Telehealth provider physical location: home office in state of [place].  Video start time: 0150 (Central Time) Video end time: 0212 (Central Time)  IDENTIFYING DATA  Stephanie Anthony is a 89 y.o. year-old female for whom a psychiatric consultation has been ordered by the primary provider. The patient was identified using two separate identifiers.  CHIEF COMPLAINT/REASON FOR CONSULT  - I was in here the other week and stayed eight days, and the whole time I was in here, I was still having hallucinations... I was seeing things that weren't there... and by this time, this week, it was really serious, really bad hallucinations. - Pt. presents with worsening visual hallucinations, insomnia, and hyperactivity following recent medication changes, seeking relief from severe psychiatric symptoms.  HISTORY OF PRESENT ILLNESS (HPI)  An 89 year old woman with a longstanding history of bipolar disorder presented with a recurrence and worsening of visual hallucinations following recent medication changes. She described a prior stable period on lamotrigine  and aripiprazole  (Abilify ) since 2015, with no significant psychiatric symptoms until her psychiatrist recommended discontinuation of Abilify  due to concerns about renal function. After cessation of Abilify , she began experiencing vivid and distressing visual hallucinations, including seeing family photographs animate and interact with her, images of demons and animals, and inanimate objects such as refrigerator handles transforming into snakes. These hallucinations were recognized by her as not real, but remained persistent and intrusive, occurring throughout her home and persisting during a recent eight-day behavioral health admission, with only brief periods of remission.  In addition to hallucinations, she reported marked hyperactivity, inability to sleep, and compulsive cleaning behaviors, describing herself as unable to rest and compelled to  keep her environment meticulously organized. She endorsed severe insomnia, often staying awake all night and through the following day. There was no  evidence of suicidal or homicidal ideation; she explicitly stated she did not wish to harm herself or others. She did, however, express a lack of fear regarding death, citing religious beliefs, but denied any intent or desire to hasten her own death.  Medical history is notable for stage 4 chronic kidney disease, with two physicians recommending dialysis, and a history of multiple surgeries. She reported new-onset bilateral lower extremity edema with numerous painful, discolored sores, which she attributed to medication changes but was also being evaluated for possible cardiac or renal etiology. She also described rectal bleeding and prolapsing hemorrhoids, with a history of prior rectal surgery. Medication reconciliation revealed confusion regarding new prescriptions, including duplicate medications (Lamotrigine  and Quetiapine ) with differing dosages, which contributed to her distress and uncertainty about adherence. She reported that the new antipsychotic regimen (including Seroquel ) had not improved her hallucinations and that her symptoms had worsened since the medication changes.  Family history is significant for multiple relatives with traumatic or violent deaths and terminal illnesses, which she recounted in detail.  A brother who committed suicide by shooting himself, a sister who died due to throat cancer, a sister who died due to pancreatic cancer, a brother who died due to spine cancer and 2 brothers who died after being shot by the police. She maintains strong social connections with her children and extended family, and provides emotional support to her surviving sister. Nutritional intake has been poor, with minimal food consumption reported in the preceding day. She expressed concern about her ability to ambulate due to leg pain and swelling.  Admission for further psychiatric stabilization and medical management was recommended.  PAST PSYCHIATRIC HISTORY  - Diagnosed with bipolar disorder in 2015 by outpatient psychiatrist; no history of suicidal ideation or suicide attempts reported. - Admitted to behavioral health unit from June 11 to June 19 for management of hallucinations; previously engaged in outpatient psychiatric care since 2015 with good stability until recent medication changes. - Previously treated with lithium (discontinued due to kidney issues), Abilify  (discontinued due to kidney concerns), and lamotrigine ; no details provided regarding dose, duration, or side effects for lamotrigine .  PAST MEDICAL HISTORY  Past Medical History:  Diagnosis Date   Anxiety    Arthritis    Bipolar 1 disorder (HCC)    Cataracts, bilateral    Chronic kidney disease    stage 3   Chronic kidney disease    Chronic mitral valve disease    COPD (chronic obstructive pulmonary disease) (HCC)    Coronary artery disease    Deviated nasal septum    Erosive esophagitis    Erosive esophagitis    Esophageal stricture    Fibroid tumor    GERD (gastroesophageal reflux disease)    History of hiatal hernia    Hypertension    Hypothyroidism    Murmur, cardiac    Nephrogenic diabetes insipidus (HCC)    Pure hypercholesterolemia    Renal cyst, left    bilateral   Seasonal allergies      HOME MEDICATIONS  Facility Ordered Medications  Medication   [COMPLETED] acetaminophen  (TYLENOL ) tablet 1,000 mg   PTA Medications  Medication Sig   pantoprazole  (PROTONIX ) 40 MG tablet Take 40 mg by mouth daily.    losartan  (COZAAR ) 25 MG tablet Take 25 mg by mouth daily.   pravastatin  (PRAVACHOL ) 10 MG tablet Take 1 tablet (10 mg total) by mouth daily at 6 PM.   QUEtiapine  (SEROQUEL ) 100 MG tablet Take 1 tablet (100 mg total) by mouth  at bedtime.   QUEtiapine  (SEROQUEL ) 25 MG tablet Take 1 tablet (25 mg total) by mouth daily with breakfast.    levothyroxine  (SYNTHROID ) 112 MCG tablet Take 1 tablet (112 mcg total) by mouth daily at 6 (six) AM.   lamoTRIgine  (LAMICTAL ) 25 MG tablet Take 1 tablet (25 mg total) by mouth daily.   lamoTRIgine  (LAMICTAL ) 100 MG tablet Take 1 tablet (100 mg total) by mouth at bedtime.   hydrocortisone  (ANUSOL -HC) 2.5 % rectal cream Place rectally 4 (four) times daily.   latanoprost  (XALATAN ) 0.005 % ophthalmic solution Place 1 drop into both eyes at bedtime.     ALLERGIES  Allergies  Allergen Reactions   Aspirin  Other (See Comments)    Rectal Bleeding   Prednisone Other (See Comments)    Hallucinations   Codeine Nausea And Vomiting   Valium [Diazepam] Other (See Comments)    Uknown     SOCIAL & SUBSTANCE USE HISTORY  Social History   Socioeconomic History   Marital status: Widowed    Spouse name: Not on file   Number of children: 2   Years of education: Not on file   Highest education level: 6th grade  Occupational History   Not on file  Tobacco Use   Smoking status: Former   Smokeless tobacco: Never   Tobacco comments:    quit 1964  Vaping Use   Vaping status: Never Used  Substance and Sexual Activity   Alcohol use: No    Alcohol/week: 0.0 standard drinks of alcohol   Drug use: No   Sexual activity: Not Currently  Other Topics Concern   Not on file  Social History Narrative   Lives at home alone. Son lives in [place].   Social Drivers of Corporate investment banker Strain: Low Risk  (08/05/2022)   Received from [facility]   Overall Financial Resource Strain (CARDIA)    Difficulty of Paying Living Expenses: Not hard at all  Food Insecurity: No Food Insecurity (10/28/2023)   Hunger Vital Sign    Worried About Running Out of Food in the Last Year: Never true    Ran Out of Food in the Last Year: Never true  Transportation Needs: No Transportation Needs (10/28/2023)   PRAPARE - Administrator, Civil Service (Medical): No    Lack of  Transportation (Non-Medical): No  Physical Activity: Not on file  Stress: Not on file  Social Connections: Socially Isolated (10/28/2023)   Social Connection and Isolation Panel    Frequency of Communication with Friends and Family: More than three times a week    Frequency of Social Gatherings with Friends and Family: More than three times a week    Attends Religious Services: Never    Database administrator or Organizations: No    Attends Banker Meetings: Never    Marital Status: Widowed   Social History   Tobacco Use  Smoking Status Former  Smokeless Tobacco Never  Tobacco Comments   quit 1964   Social History   Substance and Sexual Activity  Alcohol Use No   Alcohol/week: 0.0 standard drinks of alcohol   Social History   Substance and Sexual Activity  Drug Use No    Additional pertinent information .  FAMILY HISTORY  Family History  Problem Relation Age of Onset   Heart Problems Mother    Seizures Mother    Stroke Mother    Diverticulitis Mother    Alcohol abuse Father  Heart attack Father    Heart attack Sister    Pancreatitis Sister    Cancer Sister    Cancer Brother    Family Psychiatric History (if known):  A brother who committed suicide by shooting himself  MENTAL STATUS EXAM (MSE)  Mental Status Exam: General Appearance: Well Groomed  Orientation:  Full (Time, Place, and Person)  Memory:  intact   Concentration:  intact   Recall:  intact   Attention  Fair  Eye Contact:  Good  Speech:  hyper verbal  Language:  Good  Volume:  Normal  Mood: I was in the most extreme act of bipolar depression; also described as hyperactive and unable to sleep.  Affect:  appears congruent with the content discussed, with some expressions of distress and anxiety when describing hallucinations and medical concerns.  Thought Process:  Goal Directed and Linear  Thought Content:  Persistent and vivid visual hallucinations described in detail, including  seeing family photographs come alive, images of demons and animals, and inanimate objects transforming. She demonstrated insight, stating, I knew in my heart that it really wasn't happening, but I could see it anyway. No evidence of paranoia,  Suicidal Thoughts:  No  Homicidal Thoughts:  No  Judgement:  Good  Insight:  Good  Psychomotor Activity:  Negative  Akathisia:  Negative  Fund of Knowledge:  Good    Assets:  Communication Skills Desire for Improvement Housing  Cognition:  WNL  ADL's:  Intact  AIMS (if indicated):       VITALS  Blood pressure 131/63, pulse 75, temperature 98.1 F (36.7 C), temperature source Oral, resp. rate 18, height 5' 4 (1.626 m), weight 77.6 kg, SpO2 98%.  LABS  Admission on 11/10/2023  Component Date Value Ref Range Status   Sodium 11/10/2023 140  135 - 145 mmol/L Final   Potassium 11/10/2023 4.5  3.5 - 5.1 mmol/L Final   Chloride 11/10/2023 110  98 - 111 mmol/L Final   CO2 11/10/2023 21 (L)  22 - 32 mmol/L Final   Glucose, Bld 11/10/2023 111 (H)  70 - 99 mg/dL Final   Glucose reference range applies only to samples taken after fasting for at least 8 hours.   BUN 11/10/2023 32 (H)  8 - 23 mg/dL Final   Creatinine, Ser 11/10/2023 1.60 (H)  0.44 - 1.00 mg/dL Final   Calcium 93/75/7974 9.1  8.9 - 10.3 mg/dL Final   Total Protein 93/75/7974 6.7  6.5 - 8.1 g/dL Final   Albumin 93/75/7974 3.4 (L)  3.5 - 5.0 g/dL Final   AST 93/75/7974 21  15 - 41 U/L Final   ALT 11/10/2023 15  0 - 44 U/L Final   Alkaline Phosphatase 11/10/2023 53  38 - 126 U/L Final   Total Bilirubin 11/10/2023 0.6  0.0 - 1.2 mg/dL Final   GFR, Estimated 11/10/2023 31 (L)  >60 mL/min Final   Comment: (NOTE) Calculated using the CKD-EPI Creatinine Equation (2021)    Anion gap 11/10/2023 9  5 - 15 Final   Performed at [facility], [address] [place], [place] [address]   Alcohol, Ethyl (B) 11/10/2023 <15  <15 mg/dL Final   Comment: (NOTE) For medical purposes  only. Performed at [facility], [address] [place], [place] [address]    WBC 11/10/2023 7.2  4.0 - 10.5 K/uL Final   RBC 11/10/2023 4.19  3.87 - 5.11 MIL/uL Final   Hemoglobin 11/10/2023 10.3 (L)  12.0 - 15.0 g/dL Final  HCT 11/10/2023 31.9 (L)  36.0 - 46.0 % Final   MCV 11/10/2023 76.1 (L)  80.0 - 100.0 fL Final   MCH 11/10/2023 24.6 (L)  26.0 - 34.0 pg Final   MCHC 11/10/2023 32.3  30.0 - 36.0 g/dL Final   RDW 93/75/7974 15.0  11.5 - 15.5 % Final   Platelets 11/10/2023 224  150 - 400 K/uL Final   nRBC 11/10/2023 0.0  0.0 - 0.2 % Final   Performed at [facility], [address] [place], [place] [address]   Tricyclic, Ur Screen 11/10/2023 NONE DETECTED  NONE DETECTED Final   Amphetamines, Ur Screen 11/10/2023 NONE DETECTED  NONE DETECTED Final   MDMA (Ecstasy)Ur Screen 11/10/2023 NONE DETECTED  NONE DETECTED Final   Cocaine Metabolite,Ur [place] 11/10/2023 NONE DETECTED  NONE DETECTED Final   Opiate, Ur Screen 11/10/2023 NONE DETECTED  NONE DETECTED Final   Phencyclidine (PCP) Ur S 11/10/2023 NONE DETECTED  NONE DETECTED Final   Cannabinoid 50 Ng, Ur [place] 11/10/2023 NONE DETECTED  NONE DETECTED Final   Barbiturates, Ur Screen 11/10/2023 NONE DETECTED  NONE DETECTED Final   Benzodiazepine, Ur Scrn 11/10/2023 NONE DETECTED  NONE DETECTED Final   Methadone Scn, Ur 11/10/2023 NONE DETECTED  NONE DETECTED Final   Comment: (NOTE) Tricyclics + metabolites, urine    Cutoff 1000 ng/mL Amphetamines + metabolites, urine  Cutoff 1000 ng/mL MDMA (Ecstasy), urine              Cutoff 500 ng/mL Cocaine Metabolite, urine          Cutoff 300 ng/mL Opiate + metabolites, urine        Cutoff 300 ng/mL Phencyclidine (PCP), urine         Cutoff 25 ng/mL Cannabinoid, urine                 Cutoff 50 ng/mL Barbiturates + metabolites, urine  Cutoff 200 ng/mL Benzodiazepine, urine              Cutoff 200 ng/mL Methadone, urine                   Cutoff 300 ng/mL  The urine drug screen  provides only a preliminary, unconfirmed analytical test result and should not be used for non-medical purposes. Clinical consideration and professional judgment should be applied to any positive drug screen result due to possible interfering substances. A more specific alternate chemical method must be used in order to obtain a confirmed analytical result. Gas chromatography / mass spectrometry (GC/MS) is the preferred confirm                          atory method. Performed at [facility], [address] [place], [place] [address]     PSYCHIATRIC REVIEW OF SYSTEMS (ROS)  ROS: Notable for the following relevant positive findings: ROS  Additional findings:      Musculoskeletal: No abnormal movements observed      Gait & Station: Laying/Sitting      Pain Screening: Present - mild to moderate      Nutrition & Dental Concerns: Decrease in food intake and/or loss of appetite  RISK FORMULATION/ASSESSMENT  Is the patient experiencing any suicidal or homicidal ideations: No       Explain if yes:  Protective factors considered for safety management: include strong religious faith, a sense of purpose related to family, supportive relationships with children and grandchildren, and a stated lack of desire to  harm herself or others. She explicitly denied suicidal or homicidal ideation, stating, No, ma'am. I don't want to hurt myself or anybody else either, and expressed hope and meaning through her faith and family connections.  Risk factors/concerns considered for safety management: include advanced age, chronic medical conditions (stage 4 kidney failure, history of multiple surgeries, rectal bleeding, and leg ulcers), chronic mental health issues (bipolar disorder), and a family history of suicide and traumatic deaths. Modifiable risk factors include current severe mood symptoms, medication confusion, sleep deprivation, and ongoing visual hallucinations.   Is there a safety  management plan with the patient and treatment team to minimize risk factors and promote protective factors: Yes           Explain: admit to inpatient psych Is crisis care placement or psychiatric hospitalization recommended: Yes     Based on my current evaluation and risk assessment, patient is determined at this time to be at:  Moderate Risk  *RISK ASSESSMENT Risk assessment is a dynamic process; it is possible that this patient's condition, and risk level, may change. This should be re-evaluated and managed over time as appropriate. Please re-consult psychiatric consult services if additional assistance is needed in terms of risk assessment and management. If your team decides to discharge this patient, please advise the patient how to best access emergency psychiatric services, or to call 911, if their condition worsens or they feel unsafe in any way.   Ines Hock, NP Tele[facility] Consult Services

## 2023-11-11 NOTE — Telephone Encounter (Signed)
 Called patient to advise of the message from the provider no answer left voicemail for patient to return call to the office

## 2023-11-11 NOTE — ED Notes (Signed)
 Pt given sandwich, applesauce, and a cup of water at this time.

## 2023-11-11 NOTE — Plan of Care (Signed)

## 2023-11-11 NOTE — Group Note (Signed)
 Date:  11/11/2023 Time:  9:06 PM  Group Topic/Focus:  Self Care:   The focus of this group is to help patients understand the importance of self-care in order to improve or restore emotional, physical, spiritual, interpersonal, and financial health.    Participation Level:  Active  Participation Quality:  Appropriate  Affect:  Appropriate  Cognitive:  Appropriate  Insight: Appropriate  Engagement in Group:  Engaged  Modes of Intervention:  Education  Additional Comments:    Stephanie Anthony 11/11/2023, 9:06 PM

## 2023-11-11 NOTE — ED Notes (Signed)
 Patient given a breakfast tray.

## 2023-11-11 NOTE — ED Provider Notes (Signed)
 Emergency Medicine Observation Re-evaluation Note  Stephanie Anthony is a 89 y.o. female, seen on rounds today.  Pt initially presented to the ED for complaints of Anxiety and behavioral eval  Currently, the patient is calm, no acute complaints.  Physical Exam  Blood pressure 131/63, pulse 75, temperature 98.1 F (36.7 C), temperature source Oral, resp. rate 18, height 5' 4 (1.626 m), weight 77.6 kg, SpO2 98%. Physical Exam General: NAD Lungs: CTAB Psych: not agitated  ED Course / MDM  EKG:    I have reviewed the labs performed to date as well as medications administered while in observation.  Recent changes in the last 24 hours include no acute events overnight.    Plan  Current plan is for psych eval. Patient is not under full IVC at this time.   Viviann Pastor, MD 11/11/23 [phone]

## 2023-11-11 NOTE — ED Notes (Signed)
 This RN attempted to call report on pt. Receiving RN asked this RN to call back in about 15 minutes.

## 2023-11-11 NOTE — ED Notes (Addendum)
 Patient has been accepted to [facility] 11/11/23 pending Covid Patient assigned to room L35 Accepting physician is Dr. Jadapalle. Call report to [phone] Representative was [facility] Danika.   ER Staff is aware of it: [facility] ER Secretary Dr. Levander, ER MD [facility], RN Patient's Nurse  Michial Skeen, [facility] [phone]

## 2023-11-11 NOTE — ED Notes (Signed)
 Pt returned to bed after telepsych visit. Pt continues to talk to security, clinical staff, and other pts. Pt has not stopped talking since arrival - 10 hours.

## 2023-11-12 DIAGNOSIS — F319 Bipolar disorder, unspecified: Secondary | ICD-10-CM

## 2023-11-12 DIAGNOSIS — F3112 Bipolar disorder, current episode manic without psychotic features, moderate: Secondary | ICD-10-CM

## 2023-11-12 MED ORDER — GABAPENTIN 100 MG PO CAPS
100.0000 mg | ORAL_CAPSULE | Freq: Three times a day (TID) | ORAL | Status: DC
  Administered 2023-11-12 – 2023-11-15 (×9): 100 mg via ORAL
  Filled 2023-11-12 (×9): qty 1

## 2023-11-12 MED ORDER — HYDROCORTISONE (PERIANAL) 2.5 % EX CREA
TOPICAL_CREAM | Freq: Four times a day (QID) | CUTANEOUS | Status: DC
  Administered 2023-11-12 – 2023-11-15 (×5): 1 via RECTAL
  Filled 2023-11-12: qty 28.35

## 2023-11-12 MED ORDER — QUETIAPINE FUMARATE 100 MG PO TABS
100.0000 mg | ORAL_TABLET | Freq: Every day | ORAL | Status: DC
  Administered 2023-11-12 – 2023-11-14 (×3): 100 mg via ORAL
  Filled 2023-11-12 (×3): qty 1

## 2023-11-12 MED ORDER — LATANOPROST 0.005 % OP SOLN: 1.0000 [drp] | Freq: Every day | OPHTHALMIC | Status: DC

## 2023-11-12 MED ORDER — MELATONIN 5 MG PO TABS
2.5000 mg | ORAL_TABLET | Freq: Every day | ORAL | Status: DC
  Administered 2023-11-12 – 2023-11-14 (×4): 2.5 mg via ORAL
  Filled 2023-11-12 (×5): qty 0.5

## 2023-11-12 NOTE — H&P (Signed)
 Psychiatric Admission Assessment Adult  Patient Identification: Stephanie Anthony MRN:  [id number] Date of Evaluation:  11/12/2023 Chief Complaint:  Bipolar disorder (HCC) [F31.9]   History of Present Illness: Stephanie Anthony is a  89 year old woman with a longstanding history of bipolar disorder presented with a recurrence and worsening of visual hallucinations. Patient was recently discharged form inpatient psychiatric unit and reports getting confused about his discharge medications. Patient is admitted to [facility] unit with Q15 min safety monitoring. Multidisciplinary team approach is offered. Medication management; group/milieu therapy is offered.   Interview patient is noted to be sitting in the day area with feet elevated.  Informs the provider that after she went home she did well for 2 days and then she started seeing things moving around again.  This information is not aligning with what patient told in the emergency room stating that even on the day of discharge she was hallucinating.  After chart review patient was stabilized for many days on the unit during her previous admission and on the day of discharge patient was pretty stable with no hallucinations or any safety concerns.  Patient is noted to be laughing and having good time on the unit with the staff and about the patient's home being on the unit for some time and whom she considers as her friends from her previous admission.  Patient informed the social workers that she likes coming to the geropsych unit as she likes the food here and she gets to meet her friends on the unit.  Reviewed with the provider visual hallucinations today but continues to state that the small label of man and woman near the restroom door keeps moving.  She denies anxiety or panic attacks.  She continues to report racing thoughts and states that at home she started cleaning again and was not resting enough.  She complained of swollen legs and being painful.   She denies SI/HI/plan.  She reports being confused about her discharge medications in spite of patient taking Seroquel  for 10+ days on the inpatient unit and multiple discussions about discontinuing Abilify  due to concerns about her kidney function test.  Provider and the nurse manager met with patient's and explained mental health status of the patient where her presentation is not matching with her statements of visual hallucinations.  Family expressed their understanding that patient is attention seeking most of the times and she did tell the family that she liked coming to the hospital as she liked the food and the tension she was getting on the inpatient facility and that she made some friends on the inpatient unit.  Discussed the option of assisted living given the fact that assisted living facilities have structure and activities and help regarding medication management and food preparation.  Informed the team that patient has home health and another friend who works as a Engineer, structural, takes care of of the food and outdoor activities for the patient.  Total Time spent with patient: 45 minutes Sleep  Sleep:Sleep: Fair  Past Psychiatric History:  Psychiatric History:  Information collected from patient/chart  Prev Dx/Sx: Bipolar disorder Current Psych Provider: Dr.Hisada Home Meds (current): seroquel ; lamictal  Previous Med Trials: lithium, abilify  Therapy: unknown   Prior Psych Hospitalization: recently in 10/2023; 5 times last one in 2015  Prior Self Harm: denies Prior Violence: none reported   Family Psych History: dad with alcohol use Family Hx suicide: none reported   Social History:  Educational Hx: 6th grade Occupational Hx: on SSI Legal Hx: denies  Living Situation: lives by self but has good support from son in Mining engineer Spiritual Hx: christian Access to weapons/lethal means: denies    Substance History Alcohol: denies    Tobacco: denies Illicit drugs:  denies Prescription drug abuse: denies Rehab hx: denies Is the patient at risk to self? No.  Has the patient been a risk to self in the past 6 months? No.  Has the patient been a risk to self within the distant past? No.  Is the patient a risk to others? No.  Has the patient been a risk to others in the past 6 months? No.  Has the patient been a risk to others within the distant past? No.  [place] Scale:  Flowsheet Row Admission (Current) from 11/11/2023 in [facility] BEHAVIORAL MEDICINE ED from 11/10/2023 in [facility] at [facility] Admission (Discharged) from 10/28/2023 in [facility] BE[facility] MED[facility]INE  C-SSRS RISK CATEGORY No Risk No Risk No Risk     Past Medical History:  Past Medical History:  Diagnosis Date   Anxiety    Arthritis    Bipolar 1 disorder (HCC)    Cataracts, bilateral    Chronic kidney disease    stage 3   Chronic kidney disease    Chronic mitral valve disease    COPD (chronic obstructive pulmonary disease) (HCC)    Coronary artery disease    Deviated nasal septum    Erosive esophagitis    Erosive esophagitis    Esophageal stricture    Fibroid tumor    GERD (gastroesophageal reflux disease)    History of hiatal hernia    Hypertension    Hypothyroidism    Murmur, cardiac    Nephrogenic diabetes insipidus (HCC)    Pure hypercholesterolemia    Renal cyst, left    bilateral   Seasonal allergies     Past Surgical History:  Procedure Laterality Date   APPENDECTOMY     CARDIAC CATHETERIZATION     CHOLECYSTECTOMY     COLONOSCOPY WITH PROPOFOL  N/A 06/01/2018   Procedure: COLONOSCOPY WITH PROPOFOL ;  Surgeon: [name], MD;  Location: [facility] ENDOSCOPY;  Service: Endoscopy;  Laterality: N/A;   ESOPHAGEAL DILATION     ESOPHAGOGASTRODUODENOSCOPY (EGD) WITH PROPOFOL  N/A 10/20/2014   Procedure: ESOPHAGOGASTRODUODENOSCOPY (EGD) WITH PROPOFOL ;  Surgeon: Gladis PENNER Gaylyn, MD;  Location: [facility] ENDOSCOPY;  Service:  Endoscopy;  Laterality: N/A;   ESOPHAGOGASTRODUODENOSCOPY (EGD) WITH PROPOFOL  N/A 06/01/2018   Procedure: ESOPHAGOGASTRODUODENOSCOPY (EGD) WITH PROPOFOL ;  Surgeon: [name], MD;  Location: [facility] ENDOSCOPY;  Service: Endoscopy;  Laterality: N/A;   INFECTED SKIN DEBRIDEMENT     on back several years ago   JOINT REPLACEMENT     KNEE ARTHROPLASTY Left 06/10/2023   Procedure: COMPUTER ASSISTED TOTAL KNEE ARTHROPLASTY;  Surgeon: Mardee Lynwood SQUIBB, MD;  Location: [facility] ORS;  Service: Orthopedics;  Laterality: Left;   knee replacement, right     LAPAROSCOPIC APPENDECTOMY N/A 06/21/2018   Procedure: APPENDECTOMY LAPAROSCOPIC;  Surgeon: [name], MD;  Location: [facility] ORS;  Service: General;  Laterality: N/A;   NOSE SURGERY     THYROIDECTOMY     TOTAL THYROIDECTOMY     Family History:  Family History  Problem Relation Age of Onset   Heart Problems Mother    Seizures Mother    Stroke Mother    Diverticulitis Mother    Alcohol abuse Father    Heart attack Father    Heart attack Sister    Pancreatitis Sister    Cancer  Sister    Cancer Brother     Social History:  Social History   Substance and Sexual Activity  Alcohol Use No   Alcohol/week: 0.0 standard drinks of alcohol     Social History   Substance and Sexual Activity  Drug Use No      Allergies:   Allergies  Allergen Reactions   Aspirin  Other (See Comments)    Rectal Bleeding   Prednisone Other (See Comments)    Hallucinations   Codeine Nausea And Vomiting   Valium [Diazepam] Other (See Comments)    Uknown    Lab Results:  Results for orders placed or performed during the hospital encounter of 11/10/23 (from the past 48 hours)  Resp panel by RT-PCR (RSV, Flu A&B, Covid) Anterior Nasal Swab     Status: None   Collection Time: 11/11/23 11:14 AM   Specimen: Anterior Nasal Swab  Result Value Ref Range   SARS Coronavirus 2 by RT PCR NEGATIVE NEGATIVE    Comment: (NOTE) SARS-CoV-2 target nucleic acids  are NOT DETECTED.  The SARS-CoV-2 RNA is generally detectable in upper respiratory specimens during the acute phase of infection. The lowest concentration of SARS-CoV-2 viral copies this assay can detect is 138 copies/mL. A negative result does not preclude SARS-Cov-2 infection and should not be used as the sole basis for treatment or other patient management decisions. A negative result may occur with  improper specimen collection/handling, submission of specimen other than nasopharyngeal swab, presence of viral mutation(s) within the areas targeted by this assay, and inadequate number of viral copies(<138 copies/mL). A negative result must be combined with clinical observations, patient history, and epidemiological information. The expected result is Negative.  Fact Sheet for Patients:  BloggerCourse.com  Fact Sheet for Healthcare Providers:  SeriousBroker.it  This test is no t yet approved or cleared by the [place]  FDA and  has been authorized for detection and/or diagnosis of SARS-CoV-2 by FDA under an Emergency Use Authorization (EUA). This EUA will remain  in effect (meaning this test can be used) for the duration of the COVID-19 declaration under Section 564(b)(1) of the Act, 21 U.S.C.section 360bbb-3(b)(1), unless the authorization is terminated  or revoked sooner.       Influenza A by PCR NEGATIVE NEGATIVE   Influenza B by PCR NEGATIVE NEGATIVE    Comment: (NOTE) The Xpert Xpress SARS-CoV-2/FLU/RSV plus assay is intended as an aid in the diagnosis of influenza from Nasopharyngeal swab specimens and should not be used as a sole basis for treatment. Nasal washings and aspirates are unacceptable for Xpert Xpress SARS-CoV-2/FLU/RSV testing.  Fact Sheet for Patients: BloggerCourse.com  Fact Sheet for Healthcare Providers: SeriousBroker.it  This test is not yet  approved or cleared by the [place]  FDA and has been authorized for detection and/or diagnosis of SARS-CoV-2 by FDA under an Emergency Use Authorization (EUA). This EUA will remain in effect (meaning this test can be used) for the duration of the COVID-19 declaration under Section 564(b)(1) of the Act, 21 U.S.C. section 360bbb-3(b)(1), unless the authorization is terminated or revoked.     Resp Syncytial Virus by PCR NEGATIVE NEGATIVE    Comment: (NOTE) Fact Sheet for Patients: BloggerCourse.com  Fact Sheet for Healthcare Providers: SeriousBroker.it  This test is not yet approved or cleared by the [place]  FDA and has been authorized for detection and/or diagnosis of SARS-CoV-2 by FDA under an Emergency Use Authorization (EUA). This EUA will remain in effect (meaning this test can be used) for the  duration of the COVID-19 declaration under Section 564(b)(1) of the Act, 21 U.S.C. section 360bbb-3(b)(1), unless the authorization is terminated or revoked.  Performed at [facility], [address] [place], [place] [address]     Blood Alcohol level:  Lab Results  Component Value Date   [facility] <15 11/10/2023   [facility] <15 10/27/2023    Metabolic Disorder Labs:  Lab Results  Component Value Date   HGBA1C 5.8 (H) 10/27/2023   MPG 119.76 10/27/2023   No results found for: PROLACTIN No results found for: CHOL, TRIG, HDL, CHOLHDL, VLDL, LDLCALC  Current Medications: Current Facility-Administered Medications  Medication Dose Route Frequency Provider Last Rate Last Admin   acetaminophen  (TYLENOL ) tablet 650 mg  650 mg Oral Q6H PRN Penn, Reymundo, NP   650 mg at 11/12/23 1102   alum & mag hydroxide-simeth (MAALOX/MYLANTA) 200-200-20 MG/5ML suspension 30 mL  30 mL Oral Q4H PRN Penn, Reymundo, NP   30 mL at 11/12/23 0124   gabapentin  (NEURONTIN ) capsule 100 mg  100 mg Oral TID Sreenath, Sudheer B, MD   100 mg  at 11/12/23 2120   hydrocortisone  (ANUSOL -HC) 2.5 % rectal cream   Rectal QID Deloma Spindle, MD   1 Application at 11/12/23 2122   lamoTRIgine  (LAMICTAL ) tablet 125 mg  125 mg Oral Daily Penn, Reymundo, NP   125 mg at 11/12/23 0915   latanoprost  (XALATAN ) 0.005 % ophthalmic solution 1 drop  1 drop Both Eyes QHS Penn, Reymundo, NP   1 drop at 11/12/23 2119   levothyroxine  (SYNTHROID ) tablet 112 mcg  112 mcg Oral Q0600 Sammy Reymundo, NP   112 mcg at 11/12/23 9344   magnesium  hydroxide (MILK OF MAGNESIA) suspension 15 mL  15 mL Oral Daily PRN Sammy Reymundo, NP       melatonin tablet 2.5 mg  2.5 mg Oral QHS Trudy Carwin, NP   2.5 mg at 11/12/23 2119   OLANZapine  (ZYPREXA ) injection 5 mg  5 mg Intramuscular TID PRN Sammy Reymundo, NP       OLANZapine  zydis (ZYPREXA ) disintegrating tablet 5 mg  5 mg Oral TID PRN Sammy Reymundo, NP       pravastatin  (PRAVACHOL ) tablet 10 mg  10 mg Oral q1800 Penn, Cicely, NP   10 mg at 11/12/23 1704   QUEtiapine  (SEROQUEL ) tablet 100 mg  100 mg Oral QHS Muhsin Doris, MD   100 mg at 11/12/23 2120   PTA Medications: Medications Prior to Admission  Medication Sig Dispense Refill Last Dose/Taking   ARIPiprazole  (ABILIFY ) 2 MG tablet Take 2 mg by mouth daily. Med d/c on 6/19, but patient still taking      hydrocortisone  (ANUSOL -HC) 2.5 % rectal cream Place rectally 4 (four) times daily. 30 g 0    lamoTRIgine  (LAMICTAL ) 100 MG tablet Take 1 tablet (100 mg total) by mouth at bedtime. 30 tablet 0    lamoTRIgine  (LAMICTAL ) 25 MG tablet Take 1 tablet (25 mg total) by mouth daily. 30 tablet 0    latanoprost  (XALATAN ) 0.005 % ophthalmic solution Place 1 drop into both eyes at bedtime. 2.5 mL 12    levothyroxine  (SYNTHROID ) 112 MCG tablet Take 1 tablet (112 mcg total) by mouth daily at 6 (six) AM. 30 tablet 0    losartan  (COZAAR ) 25 MG tablet Take 25 mg by mouth daily.      pantoprazole  (PROTONIX ) 40 MG tablet Take 40 mg by mouth daily.       pravastatin  (PRAVACHOL ) 10 MG tablet Take  1 tablet (10  mg total) by mouth daily at 6 PM. 30 tablet 0    QUEtiapine  (SEROQUEL ) 100 MG tablet Take 1 tablet (100 mg total) by mouth at bedtime. 30 tablet 0    QUEtiapine  (SEROQUEL ) 25 MG tablet Take 1 tablet (25 mg total) by mouth daily with breakfast. 30 tablet 0     Psychiatric Specialty Exam:  Presentation  General Appearance:  Appropriate for Environment; Casual  Eye Contact: Fair  Speech: Normal Rate  Speech Volume: Normal    Mood and Affect  Mood: Anxious  Affect: Appropriate   Thought Process  Thought Processes: Coherent  Descriptions of Associations:Intact  Orientation:Full (Time, Place and Person)  Thought Content:Illogical  Hallucinations:Hallucinations: None  Ideas of Reference:None  Suicidal Thoughts:Suicidal Thoughts: No  Homicidal Thoughts:Homicidal Thoughts: No   Sensorium  Memory: Immediate Fair; Recent Fair; Remote Fair  Judgment: Impaired  Insight: Shallow   Executive Functions  Concentration: Fair  Attention Span: Fair  Recall: Fair  Fund of Knowledge: Fair  Language: Fair   Psychomotor Activity  Psychomotor Activity: Psychomotor Activity: Normal   Assets  Assets: Desire for Improvement; Resilience; Social Support    Musculoskeletal: Strength & Muscle Tone: within normal limits Gait & Station: unsteady  Physical Exam: Physical Exam Vitals and nursing note reviewed.  HENT:     Head: Normocephalic.     Nose: Nose normal.   Cardiovascular:     Rate and Rhythm: Normal rate.     Pulses: Normal pulses.   Neurological:     Mental Status: She is alert.    Review of Systems  Constitutional: Negative.   Eyes: Negative.   Cardiovascular: Negative.   Skin: Negative.    Blood pressure (!) 150/66, pulse 76, temperature (!) 97.2 F (36.2 C), resp. rate 18, height 5' 7 (1.702 m), weight 84.4 kg, SpO2 100%. Body mass index is 29.13 kg/m.  Principal Diagnosis: Bipolar disorder (HCC) Diagnosis:   Principal Problem:   Bipolar disorder [facility])   Clinical Decision Making:Patient with hx of Bipolar disorder recently discharged from geropsych unit 2 weeks ago readmitted for patient reporting reemergence of visual hallucinations. Patient is displaying confusion about his discharge medications and reports staying up all day and night cleaning her house, worsening pedal edema as she is not following the medical recommendations of resting/elevating her feet. Patient needs inpatient hospitalization for stabilization.  Treatment Plan Summary:  Safety and Monitoring:             -- Voluntary admission to inpatient psychiatric unit for safety, stabilization and treatment             -- Daily contact with patient to assess and evaluate symptoms and progress in treatment             -- Patient's case to be discussed in multi-disciplinary team meeting             -- Observation Level: q15 minute checks             -- Vital signs:  q12 hours             -- Precautions: suicide, elopement, and assault   2. Psychiatric Diagnoses and Treatment:                Discontinued zyprexa  due to metabolic and cardiac burden on geriatric patients. Patient has good response to seroquel  100 gm at bedtime and restarted it.    -- The risks/benefits/side-effects/alternatives to this medication were discussed in detail with the patient and time was  given for questions. The patient consents to medication trial.                -- Metabolic profile and EKG monitoring obtained while on an atypical antipsychotic (BMI: Lipid Panel: HbgA1c: QTc:)              -- Encouraged patient to participate in unit milieu and in scheduled group therapies                            3. Medical Issues Being Addressed:   Hospitalist consult for worsening pedal edema- appreciate the recommendations   4. Discharge Planning:              -- Social work and case management to assist with discharge planning and identification of hospital  follow-up needs prior to discharge             -- Estimated LOS: 5-7 days             -- Discharge Concerns: Need to establish a safety plan; Medication compliance and effectiveness             -- Discharge Goals: Return home with outpatient referrals follow ups  Physician Treatment Plan for Primary Diagnosis: Bipolar disorder (HCC) Long Term Goal(s): Improvement in symptoms so as ready for discharge  Short Term Goals: Ability to identify changes in lifestyle to reduce recurrence of condition will improve, Ability to verbalize feelings will improve, Ability to disclose and discuss suicidal ideas, Ability to demonstrate self-control will improve, and Ability to identify and develop effective coping behaviors will improve  Physician Treatment Plan for Secondary Diagnosis: Principal Problem:   Bipolar disorder (HCC)  Long Term Goal(s): Improvement in symptoms so as ready for discharge  Short Term Goals: Ability to identify changes in lifestyle to reduce recurrence of condition will improve, Ability to verbalize feelings will improve, Ability to disclose and discuss suicidal ideas, Ability to demonstrate self-control will improve, and Ability to identify and develop effective coping behaviors will improve  I certify that inpatient services furnished can reasonably be expected to improve the patient's condition.    Saudia Smyser, MD 6/26/202510:10 PM

## 2023-11-12 NOTE — Plan of Care (Signed)

## 2023-11-12 NOTE — Group Note (Signed)
 Date:  11/12/2023 Time:  11:01 AM  Group Topic/Focus:  Coping With Mental Health Crisis:   The purpose of this group is to help patients identify strategies for coping with mental health crisis.  Group discusses possible causes of crisis and ways to manage them effectively.    Participation Level:  Active  Participation Quality:  Sharing  Affect:  Appropriate  Cognitive:  Appropriate  Insight: Appropriate  Engagement in Group:  Engaged and Off Topic  Modes of Intervention:  Discussion  Additional Comments:    Stephanie Anthony 11/12/2023, 11:01 AM

## 2023-11-12 NOTE — Progress Notes (Signed)
 [facility] Hospitalist  I received a page regarding medical consult on this patient. I attempted to call the attending back and there was no pick up. I left a voicemail stating my name. Currently awaiting call back from attending provider.   Dr. Sherre

## 2023-11-12 NOTE — Consult Note (Addendum)
 [facility] Medical Consultation  Stephanie Anthony FMW:[id number] DOB: 1934/02/12 DOA: 11/11/2023 PCP: Alla Amis, MD   Requesting physician: Dr. Donnelly Date of consultation: 11/12/2023  Reason for consultation: BL Lower extremity swelling  Impression/Recommendations Principal Problem:   Bipolar disorder (HCC)  BL Lower extremity swelling The differential somewhat broad however given recent reassuring cardiac workup I do not feel that the bilateral lower extremity edema is representative of cardiogenic fluid overload.  My feel this is a combination of chronic venous insufficiency as well as possible contribution from hypoalbuminemia and poor nutrition.  Recommendations: Avoid diuretics given tenuous kidney function Leg elevation when seated Bilateral thigh-high compression stockings Low-dose gabapentin  100 mg 3 times daily RD consultation for nutritional evaluation  2.  Shortness of breath Very minimal.  Not observed on my evaluation.  Patient not noted to have any conversational dyspnea.  Lungs clear on exam.  3.  Chronic kidney disease stage IIIb-IV Kidney function better than baseline.  Creatinine 1.6 at this time.  No urgent need for hemodialysis.  Patient is established with outpatient nephrology.  Recommend outpatient follow-up.  Recommend avoidance of nonessential nephrotoxins.  4.  Bipolar disorder Managed by psychiatry  I will followup again tomorrow.  Please contact me if I can be of assistance in the meanwhile.  Thank you for this consultation.  Chief Complaint: BL Lower extremity swelling  HPI:  Stephanie Anthony is a 89 y.o. female with past medical history of CKD stage IV, HTN, bipolar disorder, who was admitted at behavioral health, was found to have increasing lower extremity swelling. We were consulted for worsening peripheral edema.   Per primary psychiatric attending the patient had a recent admission where this was noted.  Is apparently  worse than prior and associated with neuropathic pain and decreased ability to ambulate.  Review of Systems:  A full 14 full review of systems was performed.  All negative except for those mentioned in HPI.  Past Medical History:  Diagnosis Date   Anxiety    Arthritis    Bipolar 1 disorder (HCC)    Cataracts, bilateral    Chronic kidney disease    stage 3   Chronic kidney disease    Chronic mitral valve disease    COPD (chronic obstructive pulmonary disease) (HCC)    Coronary artery disease    Deviated nasal septum    Erosive esophagitis    Erosive esophagitis    Esophageal stricture    Fibroid tumor    GERD (gastroesophageal reflux disease)    History of hiatal hernia    Hypertension    Hypothyroidism    Murmur, cardiac    Nephrogenic diabetes insipidus (HCC)    Pure hypercholesterolemia    Renal cyst, left    bilateral   Seasonal allergies    Past Surgical History:  Procedure Laterality Date   APPENDECTOMY     CARDIAC CATHETERIZATION     CHOLECYSTECTOMY     COLONOSCOPY WITH PROPOFOL  N/A 06/01/2018   Procedure: COLONOSCOPY WITH PROPOFOL ;  Surgeon: [name], MD;  Location: [facility] ENDOSCOPY;  Service: Endoscopy;  Laterality: N/A;   ESOPHAGEAL DILATION     ESOPHAGOGASTRODUODENOSCOPY (EGD) WITH PROPOFOL  N/A 10/20/2014   Procedure: ESOPHAGOGASTRODUODENOSCOPY (EGD) WITH PROPOFOL ;  Surgeon: Gladis PENNER Gaylyn, MD;  Location: [facility] ENDOSCOPY;  Service: Endoscopy;  Laterality: N/A;   ESOPHAGOGASTRODUODENOSCOPY (EGD) WITH PROPOFOL  N/A 06/01/2018   Procedure: ESOPHAGOGASTRODUODENOSCOPY (EGD) WITH PROPOFOL ;  Surgeon: [name], MD;  Location: [facility] ENDOSCOPY;  Service: Endoscopy;  Laterality: N/A;  INFECTED SKIN DEBRIDEMENT     on back several years ago   JOINT REPLACEMENT     KNEE ARTHROPLASTY Left 06/10/2023   Procedure: COMPUTER ASSISTED TOTAL KNEE ARTHROPLASTY;  Surgeon: Mardee Lynwood SQUIBB, MD;  Location: [facility] ORS;  Service: Orthopedics;  Laterality: Left;    knee replacement, right     LAPAROSCOPIC APPENDECTOMY N/A 06/21/2018   Procedure: APPENDECTOMY LAPAROSCOPIC;  Surgeon: [name], MD;  Location: [facility] ORS;  Service: General;  Laterality: N/A;   NOSE SURGERY     THYROIDECTOMY     TOTAL THYROIDECTOMY     Social History:  reports that she has quit smoking. She has never used smokeless tobacco. She reports that she does not drink alcohol and does not use drugs.  Allergies  Allergen Reactions   Aspirin  Other (See Comments)    Rectal Bleeding   Prednisone Other (See Comments)    Hallucinations   Codeine Nausea And Vomiting   Valium [Diazepam] Other (See Comments)    Uknown    Family History  Problem Relation Age of Onset   Heart Problems Mother    Seizures Mother    Stroke Mother    Diverticulitis Mother    Alcohol abuse Father    Heart attack Father    Heart attack Sister    Pancreatitis Sister    Cancer Sister    Cancer Brother     Prior to Admission medications   Medication Sig Start Date End Date Taking? Authorizing Provider  ARIPiprazole  (ABILIFY ) 2 MG tablet Take 2 mg by mouth daily. Med d/c on 6/19, but patient still taking    [provider]  hydrocortisone  (ANUSOL -HC) 2.5 % rectal cream Place rectally 4 (four) times daily. 11/05/23   Jadapalle, Sree, MD  lamoTRIgine  (LAMICTAL ) 100 MG tablet Take 1 tablet (100 mg total) by mouth at bedtime. 11/05/23   Jadapalle, Sree, MD  lamoTRIgine  (LAMICTAL ) 25 MG tablet Take 1 tablet (25 mg total) by mouth daily. 11/05/23   Jadapalle, Sree, MD  latanoprost  (XALATAN ) 0.005 % ophthalmic solution Place 1 drop into both eyes at bedtime. 11/05/23   Jadapalle, Sree, MD  levothyroxine  (SYNTHROID ) 112 MCG tablet Take 1 tablet (112 mcg total) by mouth daily at 6 (six) AM. 11/06/23   Donnelly Mellow, MD  losartan  (COZAAR ) 25 MG tablet Take 25 mg by mouth daily.    [provider]  pantoprazole  (PROTONIX ) 40 MG tablet Take 40 mg by mouth daily.     [provider]  pravastatin  (PRAVACHOL ) 10 MG tablet Take 1 tablet (10 mg total) by mouth daily at 6 PM. 11/05/23   Donnelly Mellow, MD  QUEtiapine  (SEROQUEL ) 100 MG tablet Take 1 tablet (100 mg total) by mouth at bedtime. 11/05/23   Jadapalle, Sree, MD  QUEtiapine  (SEROQUEL ) 25 MG tablet Take 1 tablet (25 mg total) by mouth daily with breakfast. 11/05/23   Donnelly Mellow, MD   Physical Exam: Blood pressure (!) 145/55, pulse 74, temperature 97.8 F (36.6 C), resp. rate 17, height 5' 7 (1.702 m), weight 84.4 kg, SpO2 100%. Vitals:   11/11/23 1936 11/12/23 0719  BP: (!) 136/48 (!) 145/55  Pulse: 74 74  Resp:  17  Temp: 97.7 F (36.5 C) 97.8 F (36.6 C)  SpO2: 100% 100%  General: No apparent distress, patient appears well HEENT: Normocephalic, atraumatic Neck, supple, trachea midline, no tenderness Heart: Regular rate and rhythm, S1/S2 normal, no murmurs Lungs: Lungs clear.  Normal work of breathing.  Room air Abdomen: Soft, nontender, nondistended,  positive bowel sounds Extremities: Severe pitting and nonpitting edema bilateral lower extremities to the level of mid thigh Skin: No rashes or lesions, normal color Neurologic: Cranial nerves grossly intact, sensation intact, alert and oriented x3 Psychiatric: Flattened affect   Labs on Admission:  Basic Metabolic Panel: Recent Labs  Lab 11/10/23 1817  NA 140  K 4.5  CL 110  CO2 21*  GLUCOSE 111*  BUN 32*  CREATININE 1.60*  CALCIUM 9.1   Liver Function Tests: Recent Labs  Lab 11/10/23 1817  AST 21  ALT 15  ALKPHOS 53  BILITOT 0.6  PROT 6.7  ALBUMIN 3.4*   No results for input(s): LIPASE, AMYLASE in the last 168 hours. No results for input(s): AMMONIA in the last 168 hours. CBC: Recent Labs  Lab 11/10/23 1817  WBC 7.2  HGB 10.3*  HCT 31.9*  MCV 76.1*  PLT 224   Cardiac Enzymes: No results for input(s): CKTOTAL, CKMB, CKMBINDEX, TROPONINI in the last 168 hours. BNP: Invalid input(s): POCBNP CBG: No  results for input(s): GLUCAP in the last 168 hours.  Radiological Exams on Admission: No results found.    Time spent: 45  Alexa Blish B Zidane Renner [facility]  If 7PM-7AM, please contact night-coverage  11/12/2023, 3:11 PM
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
## 2023-11-12 NOTE — Evaluation (Signed)
 Physical Therapy Evaluation Patient Details Name: Stephanie Anthony MRN: [id number] DOB: 07/06/1933 Today's Date: 11/12/2023  History of Present Illness  Pt is an 89 yo female that presented to ED for hallucinations, anxiety. PMHx of  has a past medical history of Arthritis, Bipolar 1 disorder (HCC), Cataracts, bilateral, Chronic kidney disease, Chronic kidney disease, Chronic mitral valve disease, Coronary artery disease, Deviated nasal septum, Erosive esophagitis, Esophageal stricture, Fibroid tumor, GERD (gastroesophageal reflux disease), History of hiatal hernia, Hypertension, Hypothyroidism , Nephrogenic diabetes insipidus (HCC), Pure hypercholesterolemia, Renal cyst, left, and Seasonal allergies.   Clinical Impression  Patient alert, agreeable to PT/mobility. Oriented to self, place, month/year. Reported at baseline she lives alone, her son visits every two weeks to assist with IADLs as needed (out to restaurants, groceries, etc). Pt able to perform mobility with RW, ADLs modI/I.  Sit <> Stand with RW and CGA, when returning to sitting did leave RW outside BOS but no LOB noted. She ambulated ~180ft with RW modI, no LOB, somewhat shuffled step. BLE strength grossly at least 3+/5. The patient demonstrated and reported return to baseline level of functioning, no further acute PT needs indicated. PT to sign off. Please reconsult PT if pt status changes or acute needs are identified. CSW updated on potential benefit of discussion of ALF or [facility] to discuss options with pt/family as desired.       If plan is discharge home, recommend the following: Assist for transportation;Direct supervision/assist for medications management;Direct supervision/assist for financial management;Assistance with cooking/housework   Can travel by private vehicle        Equipment Recommendations None recommended by PT  Recommendations for Other Services       Functional Status Assessment Patient has not had  a recent decline in their functional status     Precautions / Restrictions Precautions Precautions: Fall Recall of Precautions/Restrictions: Impaired Restrictions Weight Bearing Restrictions Per Provider Order: No      Mobility  Bed Mobility               General bed mobility comments: not assessed pt in day room upon arrival    Transfers Overall transfer level: Modified independent Equipment used: Rolling walker (2 wheels)               General transfer comment: CGA with RW from chair, upon returning to chair pt does leave RW outside BOS    Ambulation/Gait Ambulation/Gait assistance: Modified independent (Device/Increase time) Gait Distance (Feet): 150 Feet Assistive device: Rolling walker (2 wheels)         General Gait Details: decreased velocity and step height, but overall steady  Stairs            Wheelchair Mobility     Tilt Bed    Modified Rankin (Stroke Patients Only)       Balance Overall balance assessment: No apparent balance deficits (not formally assessed) Sitting-balance support: Feet supported Sitting balance-Leahy Scale: Normal       Standing balance-Leahy Scale: Good                               Pertinent Vitals/Pain Pain Assessment Pain Assessment: Faces Faces Pain Scale: No hurt    Home Living Family/patient expects to be discharged to:: Private residence Living Arrangements: Alone Available Help at Discharge: Family;Available PRN/intermittently Type of Home: House Home Access: Stairs to enter Entrance Stairs-Rails: Right;Left;Can reach both Entrance Stairs-Number of Steps: 4   Home  Layout: One level Home Equipment: Other (comment);Rolling Walker (2 wheels) Additional Comments: endorsed having a RW and cane at home    Prior Function Prior Level of Function : Patient poor historian/Family not available             Mobility Comments: stated she uses her RW because he sons wants her too,  does not like using the cane ADLs Comments: Pt stated that her son comes to town every 2 weeks to assist with IADLs (groceries, errands) able to perform ADLs modI/I.     Extremity/Trunk Assessment   Upper Extremity Assessment Upper Extremity Assessment: Overall WFL for tasks assessed    Lower Extremity Assessment Lower Extremity Assessment: Overall WFL for tasks assessed (at least 3+/5 bilaterally, edema noted)       Communication        Cognition Arousal: Alert Behavior During Therapy: WFL for tasks assessed/performed   PT - Cognitive impairments: Memory                       PT - Cognition Comments: oriented to self, place ,extensive medical history, some STM deficits noted due to repetition in conversation Following commands: Intact Following commands impaired: Only follows one step commands consistently     Cueing       General Comments      Exercises     Assessment/Plan    PT Assessment Patient does not need any further PT services  PT Problem List         PT Treatment Interventions      PT Goals (Current goals can be found in the Care Plan section)       Frequency       Co-evaluation               AM-PAC PT 6 Clicks Mobility  Outcome Measure Help needed turning from your back to your side while in a flat bed without using bedrails?: None Help needed moving from lying on your back to sitting on the side of a flat bed without using bedrails?: None Help needed moving to and from a bed to a chair (including a wheelchair)?: None Help needed standing up from a chair using your arms (e.g., wheelchair or bedside chair)?: None Help needed to walk in hospital room?: None Help needed climbing 3-5 steps with a railing? : None 6 Click Score: 24    End of Session   Activity Tolerance: Patient tolerated treatment well Patient left: in chair Nurse Communication: Mobility status PT Visit Diagnosis: Other symptoms and signs involving the  nervous system (R29.898);History of falling (Z91.81)    Time: 8558-8491 PT Time Calculation (min) (ACUTE ONLY): 27 min   Charges:   PT Evaluation $PT Eval Low Complexity: 1 Low PT Treatments $Therapeutic Activity: 8-22 mins PT General Charges $$ ACUTE PT VISIT: 1 Visit    Doyal Shams PT, DPT 3:47 PM,11/12/23

## 2023-11-12 NOTE — BHH Suicide Risk Assessment (Signed)
 [facility] Admission Suicide Risk Assessment   Nursing information obtained from:  Patient Demographic factors:  Age 89 or older, Divorced or widowed, Caucasian, Living alone Current Mental Status:  NA Loss Factors:  NA Historical Factors:  NA Risk Reduction Factors:  Positive social support, Sense of responsibility to family  Total Time spent with patient: 30 minutes Principal Problem: Bipolar disorder (HCC) Diagnosis:  Principal Problem:   Bipolar disorder (HCC)  Subjective Data: Stephanie Anthony is a  89 year old woman with a longstanding history of bipolar disorder presented with a recurrence and worsening of visual hallucinations. Patient was recently discharged form inpatient psychiatric unit and reports getting confused about his discharge medications. Patient is admitted to [facility] unit with Q15 min safety monitoring. Multidisciplinary team approach is offered. Medication management; group/milieu therapy is offered.   Continued Clinical Symptoms:  Alcohol Use Disorder Identification Test Final Score (AUDIT): 0 The Alcohol Use Disorders Identification Test, Guidelines for Use in Primary Care, Second Edition.  World Science writer [facility]). Score between 0-7:  no or low risk or alcohol related problems. Score between 8-15:  moderate risk of alcohol related problems. Score between 16-19:  high risk of alcohol related problems. Score 20 or above:  warrants further diagnostic evaluation for alcohol dependence and treatment.   CLINICAL FACTORS:   Severe Anxiety and/or Agitation   Musculoskeletal: Strength & Muscle Tone: decreased Gait & Station: unsteady Patient leans: N/A  Psychiatric Specialty Exam:  Presentation  General Appearance:  Appropriate for Environment; Casual  Eye Contact: Fair  Speech: Normal Rate  Speech Volume: Normal  Handedness: Right   Mood and Affect  Mood: Anxious  Affect: Appropriate   Thought Process  Thought  Processes: Coherent  Descriptions of Associations:Intact  Orientation:Full (Time, Place and Person)  Thought Content:Illogical  History of Schizophrenia/Schizoaffective disorder:No  Duration of Psychotic Symptoms:Greater than six months  Hallucinations:Hallucinations: None  Ideas of Reference:None  Suicidal Thoughts:Suicidal Thoughts: No  Homicidal Thoughts:Homicidal Thoughts: No   Sensorium  Memory: Immediate Fair; Recent Fair; Remote Fair  Judgment: Impaired  Insight: Shallow   Executive Functions  Concentration: Fair  Attention Span: Fair  Recall: Fair  Fund of Knowledge: Fair  Language: Fair   Psychomotor Activity  Psychomotor Activity: Psychomotor Activity: Normal   Assets  Assets: Desire for Improvement; Resilience; Social Support   Sleep  Sleep: Sleep: Fair    Physical Exam: Physical Exam Vitals and nursing note reviewed.    ROS Blood pressure (!) 150/66, pulse 76, temperature (!) 97.2 F (36.2 C), resp. rate 18, height 5' 7 (1.702 m), weight 84.4 kg, SpO2 100%. Body mass index is 29.13 kg/m.   COGNITIVE FEATURES THAT CONTRIBUTE TO RISK:  None    SUICIDE RISK:   Minimal: No identifiable suicidal ideation.  Patients presenting with no risk factors but with morbid ruminations; may be classified as minimal risk based on the severity of the depressive symptoms  PLAN OF CARE: Patient is admitted to [facility] psych unit with Q15 min safety monitoring. Multidisciplinary team approach is offered. Medication management; group/milieu therapy is offered.   I certify that inpatient services furnished can reasonably be expected to improve the patient's condition.   Allyn Foil, MD 11/12/2023, 10:04 PM

## 2023-11-12 NOTE — Progress Notes (Signed)
   11/12/23 0915  Psych Admission Type (Psych Patients Only)  Admission Status Voluntary  Psychosocial Assessment  Patient Complaints None  Eye Contact Fair  Facial Expression Animated  Affect Appropriate to circumstance  Speech Logical/coherent  Interaction Assertive  Motor Activity Slow;Unsteady  Appearance/Hygiene Unremarkable  Behavior Characteristics Cooperative;Appropriate to situation  Mood Pleasant  Aggressive Behavior  Effect No apparent injury  Thought Process  Coherency WDL  Content WDL  Delusions None reported or observed  Perception WDL  Hallucination None reported or observed  Judgment Impaired  Confusion Mild (pt calmly while smiling expresses to another pt that she sees ppl dancing but she knows thats not what other ppl see.  No distress noted.  Pt is extremely interactive with other pts.  Appears very content and enjoys talking.  No complaints voiced.)  Danger to Self  Current suicidal ideation? Denies  Agreement Not to Harm Self Yes  Description of Agreement verbal  Danger to Others  Danger to Others None reported or observed

## 2023-11-12 NOTE — Group Note (Signed)
 Date:  11/12/2023 Time:  8:50 PM  Group Topic/Focus:  Healthy Communication:   The focus of this group is to discuss communication, barriers to communication, as well as healthy ways to communicate with others.    Participation Level:  Active  Participation Quality:  Appropriate  Affect:  Appropriate  Cognitive:  Appropriate  Insight: Appropriate and Good  Engagement in Group:  Engaged  Modes of Intervention:  Discussion  Additional Comments:    Stephanie Anthony 11/12/2023, 8:50 PM

## 2023-11-12 NOTE — Evaluation (Signed)
 Occupational Therapy Evaluation Patient Details Name: Stephanie Anthony MRN: [id number] DOB: 1934-02-02 Today's Date: 11/12/2023   History of Present Illness   Pt is an 89 yo female that presented to ED for hallucinations, anxiety, BLE edema. PMH significant for arthritis, Bipolar 1 disorder, anxiety, L TKA, COPD, bilateral cataracts, chronic kidney disease, CAD, GERD, HTN,    Clinical Impressions Pt admitted with above diagnosis. Prior to admission, pt lives alone with son from out of state providing assist for IADLs every few weeks. Pt can manage ADLs mod independent with RW or SPC. Pt observed to prepare a cup of coffee standing in dayroom, and walked from sink to chair in room without LOB. PT completes functional transfers and mobility with supervision using RW, has been performing ADLs mod independent in room. Pt would benefit from skilled OT services to further assess IADL management and abilities for potential discharge to ALF. OT will follow acutely.      If plan is discharge home, recommend the following:   A little help with bathing/dressing/bathroom;Assistance with cooking/housework;Direct supervision/assist for medications management;Direct supervision/assist for financial management     Functional Status Assessment   Patient has had a recent decline in their functional status and demonstrates the ability to make significant improvements in function in a reasonable and predictable amount of time.     Equipment Recommendations   None recommended by OT     Recommendations for Other Services         Precautions/Restrictions   Precautions Precautions: Fall Recall of Precautions/Restrictions: Impaired     Mobility Bed Mobility Overal bed mobility: Modified Independent             General bed mobility comments: not assessed pt in day room upon arrival    Transfers Overall transfer level: Modified independent Equipment used: Rolling walker (2  wheels) Transfers: Sit to/from Stand Sit to Stand: Supervision           General transfer comment: supervision with RW from chair, upon returning to chair pt does leave RW outside BOS      Balance Overall balance assessment: No apparent balance deficits (not formally assessed) Sitting-balance support: Feet supported Sitting balance-Leahy Scale: Normal     Standing balance support: No upper extremity supported Standing balance-Leahy Scale: Good Standing balance comment: Pt able to maintain static standing without AD and no LOB noted               High Level Balance Comments: Pt able to walk across dayroom carrying cup of coffee, no AD, no LOB noted           ADL either performed or assessed with clinical judgement   ADL Overall ADL's : Needs assistance/impaired Eating/Feeding: Set up   Grooming: Set up Grooming Details (indicate cue type and reason): anticipate with RW             Lower Body Dressing: Supervision/safety;Sitting/lateral leans   Toilet Transfer: Supervision/safety;Rolling walker (2 wheels)           Functional mobility during ADLs: Supervision/safety;Rolling walker (2 wheels) General ADL Comments: Pt recieved in dayroom, preparing a cup of coffee. Ambulates from sink to chair across room, no AD. Multiple STS transfers / mobility using RW, 100 ft with RW supervision     Vision Baseline Vision/History: 1 Wears glasses Ability to See in Adequate Light: 0 Adequate Patient Visual Report: No change from baseline              Pertinent Vitals/Pain Pain  Assessment Pain Assessment: No/denies pain     Extremity/Trunk Assessment Upper Extremity Assessment Upper Extremity Assessment: Overall WFL for tasks assessed   Lower Extremity Assessment Lower Extremity Assessment: Overall WFL for tasks assessed       Communication Communication Communication: No apparent difficulties   Cognition Arousal: Alert Behavior During Therapy: WFL for  tasks assessed/performed Cognition: Cognition impaired         Attention impairment (select first level of impairment): Sustained attention Executive functioning impairment (select all impairments): Problem solving, Reasoning OT - Cognition Comments: Pt hyperverbal, requiring redirection. Per chart, pt participated in pill box assessment and OT note indicates pt should have supervision for med mgmt due to errors during assessment.                  Following commands: Intact Following commands impaired: Only follows one step commands consistently     Cueing  General Comments   Cueing Techniques: Verbal cues   VSS           Home Living Family/patient expects to be discharged to:: Private residence Living Arrangements: Alone Available Help at Discharge: Family;Available PRN/intermittently Type of Home: House Home Access: Stairs to enter [employer] of Steps: 4 Entrance Stairs-Rails: Right;Left;Can reach both Home Layout: One level     Bathroom Shower/Tub: Sponge bathes at baseline   Bathroom Toilet: Handicapped height Bathroom Accessibility: Yes   Home Equipment: Other (comment);Rolling Walker (2 wheels)   Additional Comments: endorsed having a RW and cane at home      Prior Functioning/Environment Prior Level of Function : Patient poor historian/Family not available             Mobility Comments: stated she uses her RW because he sons wants her too, does not like using the cane ADLs Comments: Pt stated that her son comes to town every 2 weeks to assist with IADLs (groceries, errands) able to perform ADLs modI/I.    OT Problem List: Decreased activity tolerance;Decreased knowledge of use of DME or AE;Decreased safety awareness;Decreased cognition   OT Treatment/Interventions: Self-care/ADL training;DME and/or AE instruction;Therapeutic activities;Balance training;Energy conservation;Patient/family education;Cognitive remediation/compensation       OT Goals(Current goals can be found in the care plan section)   Acute Rehab OT Goals OT Goal Formulation: With patient Time For Goal Achievement: 12/02/23 Potential to Achieve Goals: Good   OT Frequency:  Min 1X/week       AM-PAC OT 6 Clicks Daily Activity     Outcome Measure Help from another person eating meals?: None Help from another person taking care of personal grooming?: None Help from another person toileting, which includes using toliet, bedpan, or urinal?: None Help from another person bathing (including washing, rinsing, drying)?: A Little Help from another person to put on and taking off regular upper body clothing?: None Help from another person to put on and taking off regular lower body clothing?: A Little 6 Click Score: 22   End of Session Equipment Utilized During Treatment: Rolling walker (2 wheels) Nurse Communication: Mobility status  Activity Tolerance: Patient tolerated treatment well Patient left: Other (comment) (in dayroom)  OT Visit Diagnosis: Other abnormalities of gait and mobility (R26.89);Cognitive communication deficit (R41.841)                Time: 8486-8469 OT Time Calculation (min): 17 min Charges:  OT General Charges $OT Visit: 1 Visit OT Evaluation $OT Eval Low Complexity: 1 Low  Abem Shaddix L. Hendrik Donath, OTR/L  11/12/23, 4:45 PM

## 2023-11-13 MED ORDER — ADULT MULTIVITAMIN W/MINERALS CH
1.0000 | ORAL_TABLET | Freq: Every day | ORAL | Status: DC
  Administered 2023-11-13 – 2023-11-15 (×3): 1 via ORAL
  Filled 2023-11-13 (×3): qty 1

## 2023-11-13 MED ORDER — ADULT MULTIVITAMIN W/MINERALS CH: 1.0000 | ORAL_TABLET | Freq: Every day | ORAL | Status: DC

## 2023-11-13 NOTE — Progress Notes (Signed)
 Pt alert and oriented. Pt is restless and anxious. Speech clear. Pt states I'm having a stroke says I see things swaying back and forth. B/p 118/70-HR 86-RR 16. Pox 98% on RA.  Amb via walker. Zyprexa  5mg  po given PRN at 0140 for agitation. Tol well. Denies SI/HI. No s/s of resp distress noted. No s/s of acute distress noted. Slept at short intervals throughout the shift. Plan of care continued.

## 2023-11-13 NOTE — Group Note (Signed)
 Date:  11/13/2023 Time:  11:49 PM  Group Topic/Focus:  Making Healthy Choices:   The focus of this group is to help patients identify negative/unhealthy choices they were using prior to admission and identify positive/healthier coping strategies to replace them upon discharge.    Participation Level:  Active  Participation Quality:  Appropriate  Affect:  Appropriate  Cognitive  Insight: Appropriate  Engagement in Group:  Engaged  Modes of Intervention:  Discussion  Additional Comments:    Stephanie Anthony 11/13/2023, 11:49 PM

## 2023-11-13 NOTE — Progress Notes (Signed)
 CONSULT PROGRESS NOTE    Stephanie Anthony  FMW:[id number] DOB: 05/15/1934 DOA: 11/11/2023 PCP: Alla Amis, MD    Brief Narrative:   Stephanie Anthony is a 89 y.o. female with past medical history of CKD stage IV, HTN, bipolar disorder, who was admitted at behavioral health, was found to have increasing lower extremity swelling. We were consulted for worsening peripheral edema.    Per primary psychiatric attending the patient had a recent admission where this was noted.  Is apparently worse than prior and associated with neuropathic pain and decreased ability to ambulate.   Assessment & Plan:   Principal Problem:   Bipolar disorder (HCC)    BL Lower extremity swelling The differential somewhat broad however given recent reassuring cardiac workup I do not feel that the bilateral lower extremity edema is representative of cardiogenic fluid overload.  I feel this is a combination of chronic venous insufficiency as well as possible contribution from hypoalbuminemia and poor nutrition.  Recommendations: Avoid diuretics given tenuous kidney function Leg elevation when seated (appears to be effective) Bilateral thigh-high compression stockings (ordered but not yet in place) Low-dose gabapentin  100 mg 3 times daily (can consider discontinuation if ataxic gait is noted) RD consultation for nutritional evaluation, recs appreciated   2.  Shortness of breath Resolved.  No clinical evidence of pulmonary edema   3.  Chronic kidney disease stage IIIb-IV Kidney function better than baseline.  Creatinine 1.6 at this time.  No urgent need for hemodialysis.  Patient is established with outpatient nephrology.  Recommend outpatient follow-up.  Recommend avoidance of nonessential nephrotoxins.   4.  Bipolar disorder Managed by psychiatry  [facility] hospitalist service to sign off at this time.    Thank you for the consult.   Please reach out with further questions or concerns.    Recommendations relayed via secure chat to primary psychiatric attending Dr. Donnelly    DVT prophylaxis: per primary Code Status: Full Family Communication:None Disposition Plan: per primary Level of care: BHU  Consultants:  [facility] hospitalist  Procedures:  NA  Antimicrobials: None    Subjective: Seen and examined.  Stable this AM.  Seated in day room with legs elevatede  Objective: Vitals:   11/12/23 0719 11/12/23 1932 11/13/23 0133 11/13/23 0717  BP: (!) 145/55 (!) 150/66 118/70 (!) 107/50  Pulse: 74 76 86 79  Resp: 17 18 16 20   Temp: 97.8 F (36.6 C) (!) 97.2 F (36.2 C)  (!) 97.1 F (36.2 C)  TempSrc:      SpO2: 100% 100% 98% 100%  Weight:      Height:       No intake or output data in the 24 hours ending 11/13/23 1026 Filed Weights   11/11/23 1500  Weight: 84.4 kg    Examination:  General: No apparent distress, patient appears well HEENT: Normocephalic, atraumatic Neck, supple, trachea midline, no tenderness Heart: Regular rate and rhythm, S1/S2 normal, no murmurs Lungs: Lungs clear.  Normal work of breathing.  Room air Abdomen: Soft, nontender, nondistended, positive bowel sounds Extremities: Severe pitting and nonpitting edema bilateral lower extremities to the level of mid thigh Skin: No rashes or lesions, normal color Neurologic: Cranial nerves grossly intact, sensation intact, alert and oriented x3 Psychiatric: Flattened affect    Data Reviewed: I have personally reviewed following labs and imaging studies  CBC: Recent Labs  Lab 11/10/23 1817  WBC 7.2  HGB 10.3*  HCT 31.9*  MCV 76.1*  PLT 224   Basic Metabolic Panel:  Recent Labs  Lab 11/10/23 1817  NA 140  K 4.5  CL 110  CO2 21*  GLUCOSE 111*  BUN 32*  CREATININE 1.60*  CALCIUM 9.1   GFR: Estimated Creatinine Clearance: 27.6 mL/min (A) (by C-G formula based on SCr of 1.6 mg/dL (H)). Liver Function Tests: Recent Labs  Lab 11/10/23 1817  AST 21  ALT 15  ALKPHOS 53   BILITOT 0.6  PROT 6.7  ALBUMIN 3.4*   No results for input(s): LIPASE, AMYLASE in the last 168 hours. No results for input(s): AMMONIA in the last 168 hours. Coagulation Profile: No results for input(s): INR, PROTIME in the last 168 hours. Cardiac Enzymes: No results for input(s): CKTOTAL, CKMB, CKMBINDEX, TROPONINI in the last 168 hours. BNP (last 3 results) No results for input(s): PROBNP in the last 8760 hours. HbA1C: No results for input(s): HGBA1C in the last 72 hours. CBG: No results for input(s): GLUCAP in the last 168 hours. Lipid Profile: No results for input(s): CHOL, HDL, LDLCALC, TRIG, CHOLHDL, LDLDIRECT in the last 72 hours. Thyroid  Function Tests: No results for input(s): TSH, T4TOTAL, FREET4, T3FREE, THYROIDAB in the last 72 hours. Anemia Panel: No results for input(s): VITAMINB12, FOLATE, FERRITIN, TIBC, IRON, RETICCTPCT in the last 72 hours. Sepsis Labs: No results for input(s): PROCALCITON, LATICACIDVEN in the last 168 hours.  Recent Results (from the past 240 hours)  Resp panel by RT-PCR (RSV, Flu A&B, Covid) Anterior Nasal Swab     Status: None   Collection Time: 11/11/23 11:14 AM   Specimen: Anterior Nasal Swab  Result Value Ref Range Status   SARS Coronavirus 2 by RT PCR NEGATIVE NEGATIVE Final    Comment: (NOTE) SARS-CoV-2 target nucleic acids are NOT DETECTED.  The SARS-CoV-2 RNA is generally detectable in upper respiratory specimens during the acute phase of infection. The lowest concentration of SARS-CoV-2 viral copies this assay can detect is 138 copies/mL. A negative result does not preclude SARS-Cov-2 infection and should not be used as the sole basis for treatment or other patient management decisions. A negative result may occur with  improper specimen collection/handling, submission of specimen other than nasopharyngeal swab, presence of viral mutation(s) within the areas targeted  by this assay, and inadequate number of viral copies(<138 copies/mL). A negative result must be combined with clinical observations, patient history, and epidemiological information. The expected result is Negative.  Fact Sheet for Patients:  BloggerCourse.com  Fact Sheet for Healthcare Providers:  SeriousBroker.it  This test is no t yet approved or cleared by the [place]  FDA and  has been authorized for detection and/or diagnosis of SARS-CoV-2 by FDA under an Emergency Use Authorization (EUA). This EUA will remain  in effect (meaning this test can be used) for the duration of the COVID-19 declaration under Section 564(b)(1) of the Act, 21 U.S.C.section 360bbb-3(b)(1), unless the authorization is terminated  or revoked sooner.       Influenza A by PCR NEGATIVE NEGATIVE Final   Influenza B by PCR NEGATIVE NEGATIVE Final    Comment: (NOTE) The Xpert Xpress SARS-CoV-2/FLU/RSV plus assay is intended as an aid in the diagnosis of influenza from Nasopharyngeal swab specimens and should not be used as a sole basis for treatment. Nasal washings and aspirates are unacceptable for Xpert Xpress SARS-CoV-2/FLU/RSV testing.  Fact Sheet for Patients: BloggerCourse.com  Fact Sheet for Healthcare Providers: SeriousBroker.it  This test is not yet approved or cleared by the [place]  FDA and has been authorized for detection and/or diagnosis of SARS-CoV-2 by  FDA under an Emergency Use Authorization (EUA). This EUA will remain in effect (meaning this test can be used) for the duration of the COVID-19 declaration under Section 564(b)(1) of the Act, 21 U.S.C. section 360bbb-3(b)(1), unless the authorization is terminated or revoked.     Resp Syncytial Virus by PCR NEGATIVE NEGATIVE Final    Comment: (NOTE) Fact Sheet for Patients: BloggerCourse.com  Fact  Sheet for Healthcare Providers: SeriousBroker.it  This test is not yet approved or cleared by the [place]  FDA and has been authorized for detection and/or diagnosis of SARS-CoV-2 by FDA under an Emergency Use Authorization (EUA). This EUA will remain in effect (meaning this test can be used) for the duration of the COVID-19 declaration under Section 564(b)(1) of the Act, 21 U.S.C. section 360bbb-3(b)(1), unless the authorization is terminated or revoked.  Performed at [facility], [address] [place], [place] [address]          Radiology Studies: No results found.      Scheduled Meds:  gabapentin   100 mg Oral TID   hydrocortisone    Rectal QID   lamoTRIgine   125 mg Oral Daily   latanoprost   1 drop Both Eyes QHS   levothyroxine   112 mcg Oral Q0600   melatonin  2.5 mg Oral QHS   multivitamin with minerals  1 tablet Oral Daily   pravastatin   10 mg Oral q1800   QUEtiapine   100 mg Oral QHS   Continuous Infusions:   LOS: 2 days    Stephanie KATHEE Robson, MD [facility]   If 7PM-7AM, please contact night-coverage  11/13/2023, 10:26 AM

## 2023-11-13 NOTE — BH IP Treatment Plan (Signed)
 Interdisciplinary Treatment and Diagnostic Plan Update  11/13/2023 Time of Session: 10:59 AM  Stephanie Anthony MRN: [id number]  Principal Diagnosis: Bipolar disorder [facility])  Secondary Diagnoses: Principal Problem:   Bipolar disorder (HCC)   Current Medications:  Current Facility-Administered Medications  Medication Dose Route Frequency Provider Last Rate Last Admin   acetaminophen  (TYLENOL ) tablet 650 mg  650 mg Oral Q6H PRN Penn, Reymundo, NP   650 mg at 11/13/23 0925   alum & mag hydroxide-simeth (MAALOX/MYLANTA) 200-200-20 MG/5ML suspension 30 mL  30 mL Oral Q4H PRN Penn, Reymundo, NP   30 mL at 11/12/23 0124   gabapentin  (NEURONTIN ) capsule 100 mg  100 mg Oral TID Sreenath, Sudheer B, MD   100 mg at 11/13/23 9075   hydrocortisone  (ANUSOL -HC) 2.5 % rectal cream   Rectal QID Jadapalle, Sree, MD   1 Application at 11/13/23 9076   lamoTRIgine  (LAMICTAL ) tablet 125 mg  125 mg Oral Daily Penn, Reymundo, NP   125 mg at 11/13/23 9075   latanoprost  (XALATAN ) 0.005 % ophthalmic solution 1 drop  1 drop Both Eyes QHS Penn, Reymundo, NP   1 drop at 11/12/23 2119   levothyroxine  (SYNTHROID ) tablet 112 mcg  112 mcg Oral Q0600 Sammy Reymundo, NP   112 mcg at 11/13/23 0700   magnesium  hydroxide (MILK OF MAGNESIA) suspension 15 mL  15 mL Oral Daily PRN Sammy Reymundo, NP       melatonin tablet 2.5 mg  2.5 mg Oral QHS Trudy Carwin, NP   2.5 mg at 11/12/23 2119   multivitamin with minerals tablet 1 tablet  1 tablet Oral Daily Niels Barrio F, RPH       OLANZapine  (ZYPREXA ) injection 5 mg  5 mg Intramuscular TID PRN Sammy Reymundo, NP       OLANZapine  zydis (ZYPREXA ) disintegrating tablet 5 mg  5 mg Oral TID PRN Sammy Reymundo, NP   5 mg at 11/13/23 0139   pravastatin  (PRAVACHOL ) tablet 10 mg  10 mg Oral q1800 Penn, Reymundo, NP   10 mg at 11/12/23 1704   QUEtiapine  (SEROQUEL ) tablet 100 mg  100 mg Oral QHS Jadapalle, Sree, MD   100 mg at 11/12/23 2120   PTA Medications: Medications Prior to Admission  Medication  Sig Dispense Refill Last Dose/Taking   ARIPiprazole  (ABILIFY ) 2 MG tablet Take 2 mg by mouth daily. Med d/c on 6/19, but patient still taking      hydrocortisone  (ANUSOL -HC) 2.5 % rectal cream Place rectally 4 (four) times daily. 30 g 0    lamoTRIgine  (LAMICTAL ) 100 MG tablet Take 1 tablet (100 mg total) by mouth at bedtime. 30 tablet 0    lamoTRIgine  (LAMICTAL ) 25 MG tablet Take 1 tablet (25 mg total) by mouth daily. 30 tablet 0    latanoprost  (XALATAN ) 0.005 % ophthalmic solution Place 1 drop into both eyes at bedtime. 2.5 mL 12    levothyroxine  (SYNTHROID ) 112 MCG tablet Take 1 tablet (112 mcg total) by mouth daily at 6 (six) AM. 30 tablet 0    losartan  (COZAAR ) 25 MG tablet Take 25 mg by mouth daily.      pantoprazole  (PROTONIX ) 40 MG tablet Take 40 mg by mouth daily.       pravastatin  (PRAVACHOL ) 10 MG tablet Take 1 tablet (10 mg total) by mouth daily at 6 PM. 30 tablet 0    QUEtiapine  (SEROQUEL ) 100 MG tablet Take 1 tablet (100 mg total) by mouth at bedtime. 30 tablet 0    QUEtiapine  (SEROQUEL ) 25 MG tablet  Take 1 tablet (25 mg total) by mouth daily with breakfast. 30 tablet 0     Patient Stressors:    Patient Strengths:    Treatment Modalities: Medication Management, Group therapy, Case management,  1 to 1 session with clinician, Psychoeducation, Recreational therapy.   Physician Treatment Plan for Primary Diagnosis: Bipolar disorder (HCC) Long Term Goal(s): Improvement in symptoms so as ready for discharge   Short Term Goals: Ability to identify changes in lifestyle to reduce recurrence of condition will improve Ability to verbalize feelings will improve Ability to disclose and discuss suicidal ideas Ability to demonstrate self-control will improve Ability to identify and develop effective coping behaviors will improve  Medication Management: Evaluate patient's response, side effects, and tolerance of medication regimen.  Therapeutic Interventions: 1 to 1 sessions, Unit Group  sessions and Medication administration.  Evaluation of Outcomes: Progressing  Physician Treatment Plan for Secondary Diagnosis: Principal Problem:   Bipolar disorder (HCC)  Long Term Goal(s): Improvement in symptoms so as ready for discharge   Short Term Goals: Ability to identify changes in lifestyle to reduce recurrence of condition will improve Ability to verbalize feelings will improve Ability to disclose and discuss suicidal ideas Ability to demonstrate self-control will improve Ability to identify and develop effective coping behaviors will improve     Medication Management: Evaluate patient's response, side effects, and tolerance of medication regimen.  Therapeutic Interventions: 1 to 1 sessions, Unit Group sessions and Medication administration.  Evaluation of Outcomes: Progressing   RN Treatment Plan for Primary Diagnosis: Bipolar disorder (HCC) Long Term Goal(s): Knowledge of disease and therapeutic regimen to maintain health will improve  Short Term Goals: Ability to remain free from injury will improve, Ability to verbalize frustration and anger appropriately will improve, Ability to demonstrate self-control, Ability to participate in decision making will improve, Ability to verbalize feelings will improve, Ability to disclose and discuss suicidal ideas, Ability to identify and develop effective coping behaviors will improve, and Compliance with prescribed medications will improve  Medication Management: RN will administer medications as ordered by provider, will assess and evaluate patient's response and provide education to patient for prescribed medication. RN will report any adverse and/or side effects to prescribing provider.  Therapeutic Interventions: 1 on 1 counseling sessions, Psychoeducation, Medication administration, Evaluate responses to treatment, Monitor vital signs and CBGs as ordered, Perform/monitor CIWA, COWS, AIMS and Fall Risk screenings as ordered,  Perform wound care treatments as ordered.  Evaluation of Outcomes: Progressing   LCSW Treatment Plan for Primary Diagnosis: Bipolar disorder (HCC) Long Term Goal(s): Safe transition to appropriate next level of care at discharge, Engage patient in therapeutic group addressing interpersonal concerns.  Short Term Goals: Engage patient in aftercare planning with referrals and resources, Increase social support, Increase ability to appropriately verbalize feelings, Increase emotional regulation, Facilitate acceptance of mental health diagnosis and concerns, Facilitate patient progression through stages of change regarding substance use diagnoses and concerns, Identify triggers associated with mental health/substance abuse issues, and Increase skills for wellness and recovery  Therapeutic Interventions: Assess for all discharge needs, 1 to 1 time with Social worker, Explore available resources and support systems, Assess for adequacy in community support network, Educate family and significant other(s) on suicide prevention, Complete Psychosocial Assessment, Interpersonal group therapy.  Evaluation of Outcomes: Progressing   Progress in Treatment: Attending groups: Yes. and No. Participating in groups: Yes. and No. Taking medication as prescribed: Yes. Toleration medication: Yes. Family/Significant other contact made: No, will contact:  CSW will contact if given permission  Patient understands diagnosis: Yes. Discussing patient identified problems/goals with staff: Yes. Medical problems stabilized or resolved: Yes. Denies suicidal/homicidal ideation: Yes. Issues/concerns per patient self-inventory: No. Other: None   New problem(s) identified: No, Describe:  None identified   New Short Term/Long Term Goal(s): elimination of symptoms of psychosis, medication management for mood stabilization; elimination of SI thoughts; development of comprehensive mental wellness plan.   Patient Goals:  I  have already decided I am going to assisted living   Discharge Plan or Barriers: CSW will assist with appropriate discharge planning   Reason for Continuation of Hospitalization: Medication stabilization  Estimated Length of Stay: 1 to 7 days   Last 3 [place] Suicide Severity Risk Score: Flowsheet Row Admission (Current) from 11/11/2023 [facility] BE[facility] ED from 11/10/2023 in [facility] Emergency Department at [facility] Admission (Discharged) from 10/28/2023 in [facility] BE[facility]AL [facility]  C-SSRS RISK CATEGORY No Risk No Risk No Risk    Last PHQ 2/9 Scores:    06/01/2023   11:47 AM 09/29/2022   12:02 PM 07/10/2022   12:05 PM  Depression screen PHQ 2/9  Decreased Interest 1 1 2   Down, Depressed, Hopeless 0 1 1  PHQ - 2 Score 1 2 3   Altered sleeping  1 1  Tired, decreased energy  2 3  Change in appetite  0 0  Feeling bad or failure about yourself   1 2  Trouble concentrating  0 0  Moving slowly or fidgety/restless  0 0  Suicidal thoughts  0 0  PHQ-9 Score  6 9  Difficult doing work/chores  Somewhat difficult Somewhat difficult    Scribe for Treatment Team: Lum JONETTA Croft, [place] 11/13/2023 2:05 PM

## 2023-11-13 NOTE — Progress Notes (Signed)
   11/12/23 2000  Psych Admission Type (Psych Patients Only)  Admission Status Voluntary  Psychosocial Assessment  Patient Complaints None  Eye Contact Fair  Facial Expression Animated  Affect Appropriate to circumstance  Speech Logical/coherent  Interaction Assertive  Motor Activity Slow  Appearance/Hygiene Unremarkable  Behavior Characteristics Appropriate to situation  Mood Pleasant  Thought Process  Coherency WDL  Content WDL  Delusions None reported or observed  Perception WDL  Hallucination None reported or observed  Judgment Impaired  Confusion Mild  Danger to Self  Current suicidal ideation? Denies

## 2023-11-13 NOTE — Progress Notes (Signed)
   11/13/23 1100  Psych Admission Type (Psych Patients Only)  Admission Status Voluntary  Psychosocial Assessment  Patient Complaints Helplessness  Eye Contact Fair  Facial Expression Animated  Affect Appropriate to circumstance  Speech Logical/coherent  Interaction Assertive  Motor Activity Slow  Appearance/Hygiene Unremarkable  Behavior Characteristics Appropriate to situation  Mood Pleasant  Thought Process  Coherency WDL  Content WDL  Delusions None reported or observed  Perception WDL  Hallucination None reported or observed  Judgment Impaired  Confusion Mild  Danger to Self  Current suicidal ideation? Denies  Agreement Not to Harm Self Yes  Description of Agreement verbal  Danger to Others  Danger to Others None reported or observed

## 2023-11-13 NOTE — Progress Notes (Signed)
 NUTRITION ASSESSMENT  Pt identified as at risk on the Malnutrition Screen Tool  INTERVENTION:  -Continue regular diet -MVI with minerals daily -Magic cup BID with meals, each supplement provides 290 kcal and 9 grams of protein   NUTRITION DIAGNOSIS: Unintentional weight loss related to sub-optimal intake as evidenced by pt report.   Goal: Pt to meet >/= 90% of their estimated nutrition needs.  Monitor:  PO intake  Assessment:  Pt with a longstanding history of bipolar disorder presented with a recurrence and worsening of visual hallucinations.   Attempted to visit pt in room, however, pt not in room of visit. RD unable to obtain further nutrition-related history or complete nutrition-focused physical exam at this time.    Pt currently on a regular diet. Noted that pt was recently hospitalized in inpatient unit and likes the food here. No meal completion data available to assess at this time.   Medications reviewed and include neurontin  and melatonin.   No wt loss noted. Noted weight gain. Noted pt with mild edema per RN assessment, which is likely masking true weight loss as well as fat and muscle depletions.   Per psych notes, recommending ALF placement for structure and meal preparation.   Albumin has a half-life of 21 days and is strongly affected by stress response and inflammatory process, therefore, do not expect to see an improvement in this lab value during acute hospitalization. When a patient presents with low albumin, it is likely skewed due to the acute inflammatory response. Note that low albumin is no longer used to diagnose malnutrition; Anna uses the new malnutrition guidelines published by the American Society for Parenteral and Enteral Nutrition (A.S.P.E.N.) and the Academy of Nutrition and Dietetics (AND).     Labs reviewed.   89 y.o. female  Height: Ht Readings from Last 1 Encounters:  11/11/23 5' 7 (1.702 m)    Weight: Wt Readings from Last 1  Encounters:  11/11/23 84.4 kg    Weight Hx: Wt Readings from Last 10 Encounters:  11/11/23 84.4 kg  11/10/23 77.6 kg  10/28/23 80.3 kg  10/27/23 79.3 kg  10/20/23 79.3 kg  08/31/23 76.6 kg  06/29/23 76.7 kg  06/10/23 76.7 kg  06/01/23 76.7 kg  03/05/23 76.5 kg    BMI:  Body mass index is 29.13 kg/m. Pt meets criteria for overweight based on current BMI.  Estimated Nutritional Needs: Kcal: 25-30 kcal/kg Protein: > 1 gram protein/kg Fluid: 1 ml/kcal  Diet Order:  Diet Order             Diet regular Room service appropriate? Yes; Fluid consistency: Thin  Diet effective now                  Pt is also offered choice of unit snacks mid-morning and mid-afternoon.  Pt is eating as desired.   Lab results and medications reviewed.   Margery ORN, RD, LDN, CDCES Registered Dietitian III Certified Diabetes Care and Education Specialist If unable to reach this RD, please use RD Inpatient group chat on secure chat between hours of 8am-4 pm daily

## 2023-11-13 NOTE — Progress Notes (Signed)
 [facility] MD Progress Note  11/13/2023 3:29 PM Stephanie Anthony  MRN:  [id number] Stephanie Anthony is a  89 year old woman with a longstanding history of bipolar disorder presented with a recurrence and worsening of visual hallucinations. Patient was recently discharged form inpatient psychiatric unit and reports getting confused about his discharge medications. Patient is admitted to [facility] unit with Q15 min safety monitoring. Multidisciplinary team approach is offered. Medication management; group/milieu therapy is offered.   Subjective:  Chart reviewed, case discussed in multidisciplinary meeting, patient seen during rounds.  Patient is noted to be talking to the peers.  She met with the treatment team.  She reports that she made a decision to go to the assisted living after getting her thyroid  surgery completed and selling her house.  She did acknowledge that she feels better and good when she is surrounded by friends and people as she reports that she has a gift of giving to people.  She is noted to be circumstantial and continues to talk about coming off of her Abilify  and the visual hallucination she had during her previous hospitalization.  Per nursing staff and providers observation patient is not responding to internal stimuli and is not showing any signs of visual hallucinations especially when she is with her peers and having fun.  She denies SI/HI/plan.   Sleep: Fair  Appetite:  Fair  Past Psychiatric History: see h&P Family History:  Family History  Problem Relation Age of Onset   Heart Problems Mother    Seizures Mother    Stroke Mother    Diverticulitis Mother    Alcohol abuse Father    Heart attack Father    Heart attack Sister    Pancreatitis Sister    Cancer Sister    Cancer Brother    Social History:  Social History   Substance and Sexual Activity  Alcohol Use No   Alcohol/week: 0.0 standard drinks of alcohol     Social History   Substance and Sexual Activity   Drug Use No    Social History   Socioeconomic History   Marital status: Widowed    Spouse name: Not on file   Number of children: 2   Years of education: Not on file   Highest education level: 6th grade  Occupational History   Not on file  Tobacco Use   Smoking status: Former   Smokeless tobacco: Never   Tobacco comments:    quit 1964  Vaping Use   Vaping status: Never Used  Substance and Sexual Activity   Alcohol use: No    Alcohol/week: 0.0 standard drinks of alcohol   Drug use: No   Sexual activity: Not Currently  Other Topics Concern   Not on file  Social History Narrative   Lives at home alone. Son lives in [place].   Social Drivers of Corporate investment banker Strain: Low Risk  (08/05/2022)   Received from [facility]   Overall Financial Resource Strain (CARDIA)    Difficulty of Paying Living Expenses: Not hard at all  Food Insecurity: No Food Insecurity (11/11/2023)   Hunger Vital Sign    Worried About Running Out of Food in the Last Year: Never true    Ran Out of Food in the Last Year: Never true  Transportation Needs: No Transportation Needs (11/11/2023)   PRAPARE - Administrator, Civil Service (Medical): No    Lack of Transportation (Non-Medical): No  Physical Activity: Not on file  Stress: Not on file  Social Connections: Socially Isolated (11/11/2023)   Social Connection and Isolation Panel    Frequency of Communication with Friends and Family: More than three times a week    Frequency of Social Gatherings with Friends and Family: More than three times a week    Attends Religious Services: Never    Database administrator or Organizations: No    Attends Banker Meetings: Never    Marital Status: Widowed   Past Medical History:  Past Medical History:  Diagnosis Date   Anxiety    Arthritis    Bipolar 1 disorder (HCC)    Cataracts, bilateral    Chronic kidney disease    stage 3   Chronic kidney  disease    Chronic mitral valve disease    COPD (chronic obstructive pulmonary disease) (HCC)    Coronary artery disease    Deviated nasal septum    Erosive esophagitis    Erosive esophagitis    Esophageal stricture    Fibroid tumor    GERD (gastroesophageal reflux disease)    History of hiatal hernia    Hypertension    Hypothyroidism    Murmur, cardiac    Nephrogenic diabetes insipidus (HCC)    Pure hypercholesterolemia    Renal cyst, left    bilateral   Seasonal allergies     Past Surgical History:  Procedure Laterality Date   APPENDECTOMY     CARDIAC CATHETERIZATION     CHOLECYSTECTOMY     COLONOSCOPY WITH PROPOFOL  N/A 06/01/2018   Procedure: COLONOSCOPY WITH PROPOFOL ;  Surgeon: [name], MD;  Location: [facility] ENDOSCOPY;  Service: Endoscopy;  Laterality: N/A;   ESOPHAGEAL DILATION     ESOPHAGOGASTRODUODENOSCOPY (EGD) WITH PROPOFOL  N/A 10/20/2014   Procedure: ESOPHAGOGASTRODUODENOSCOPY (EGD) WITH PROPOFOL ;  Surgeon: Gladis PENNER Gaylyn, MD;  Location: [facility] ENDOSCOPY;  Service: Endoscopy;  Laterality: N/A;   ESOPHAGOGASTRODUODENOSCOPY (EGD) WITH PROPOFOL  N/A 06/01/2018   Procedure: ESOPHAGOGASTRODUODENOSCOPY (EGD) WITH PROPOFOL ;  Surgeon: [name], MD;  Location: [facility] ENDOSCOPY;  Service: Endoscopy;  Laterality: N/A;   INFECTED SKIN DEBRIDEMENT     on back several years ago   JOINT REPLACEMENT     KNEE ARTHROPLASTY Left 06/10/2023   Procedure: COMPUTER ASSISTED TOTAL KNEE ARTHROPLASTY;  Surgeon: Mardee Lynwood SQUIBB, MD;  Location: [facility] ORS;  Service: Orthopedics;  Laterality: Left;   knee replacement, right     LAPAROSCOPIC APPENDECTOMY N/A 06/21/2018   Procedure: APPENDECTOMY LAPAROSCOPIC;  Surgeon: [name], MD;  Location: [facility] ORS;  Service: General;  Laterality: N/A;   NOSE SURGERY     THYROIDECTOMY     TOTAL THYROIDECTOMY      Current Medications: Current Facility-Administered Medications  Medication Dose Route Frequency Provider Last Rate  Last Admin   acetaminophen  (TYLENOL ) tablet 650 mg  650 mg Oral Q6H PRN Penn, Cicely, NP   650 mg at 11/13/23 0925   alum & mag hydroxide-simeth (MAALOX/MYLANTA) 200-200-20 MG/5ML suspension 30 mL  30 mL Oral Q4H PRN Penn, Reymundo, NP   30 mL at 11/12/23 0124   gabapentin  (NEURONTIN ) capsule 100 mg  100 mg Oral TID Sreenath, Sudheer B, MD   100 mg at 11/13/23 0924   hydrocortisone  (ANUSOL -HC) 2.5 % rectal cream   Rectal QID Evette Diclemente, MD   1 Application at 11/13/23 9076   lamoTRIgine  (LAMICTAL ) tablet 125 mg  125 mg Oral Daily Penn, Reymundo, NP   125 mg at 11/13/23 0924   latanoprost  (XALATAN ) 0.005 % ophthalmic solution  1 drop  1 drop Both Eyes QHS Penn, Reymundo, NP   1 drop at 11/12/23 2119   levothyroxine  (SYNTHROID ) tablet 112 mcg  112 mcg Oral Q0600 Sammy Reymundo, NP   112 mcg at 11/13/23 0700   magnesium  hydroxide (MILK OF MAGNESIA) suspension 15 mL  15 mL Oral Daily PRN Sammy Reymundo, NP       melatonin tablet 2.5 mg  2.5 mg Oral QHS Trudy Carwin, NP   2.5 mg at 11/12/23 2119   multivitamin with minerals tablet 1 tablet  1 tablet Oral Daily Niels Barrio F, RPH       OLANZapine  (ZYPREXA ) injection 5 mg  5 mg Intramuscular TID PRN Sammy Reymundo, NP       OLANZapine  zydis (ZYPREXA ) disintegrating tablet 5 mg  5 mg Oral TID PRN Sammy Reymundo, NP   5 mg at 11/13/23 0139   pravastatin  (PRAVACHOL ) tablet 10 mg  10 mg Oral q1800 Penn, Reymundo, NP   10 mg at 11/12/23 1704   QUEtiapine  (SEROQUEL ) tablet 100 mg  100 mg Oral QHS Bhavesh Vazquez, MD   100 mg at 11/12/23 2120    Lab Results: No results found for this or any previous visit (from the past 48 hours).  Blood Alcohol level:  Lab Results  Component Value Date   [employer] <15 11/10/2023   ETH <15 10/27/2023    Metabolic Disorder Labs: Lab Results  Component Value Date   HGBA1C 5.8 (H) 10/27/2023   MPG 119.76 10/27/2023   No results found for: PROLACTIN No results found for: CHOL, TRIG, HDL, CHOLHDL, VLDL,  LDLCALC  Physical Findings: AIMS:  , ,  ,  ,    CIWA:    COWS:      Psychiatric Specialty Exam:  Presentation  General Appearance:  Appropriate for Environment; Casual  Eye Contact: Fair  Speech: Normal Rate  Speech Volume: Normal    Mood and Affect  Mood: Anxious  Affect: Appropriate   Thought Process  Thought Processes: Coherent  Descriptions of Associations:Intact  Orientation:Full (Time, Place and Person)  Thought Content:Illogical  Hallucinations:Hallucinations: None  Ideas of Reference:None  Suicidal Thoughts:Suicidal Thoughts: No  Homicidal Thoughts:Homicidal Thoughts: No   Sensorium  Memory: Immediate Fair; Recent Fair; Remote Fair  Judgment: Impaired  Insight: Shallow   Executive Functions  Concentration: Fair  Attention Span: Fair  Recall: [employer] of Knowledge: Fair  Language: Fair   Psychomotor Activity  Psychomotor Activity: Psychomotor Activity: Normal  Musculoskeletal: Strength & Muscle Tone: decreased Gait & Station: unsteady Assets  Assets: Desire for Improvement; Resilience; Social Support    Physical Exam: Physical Exam ROS Blood pressure (!) 107/50, pulse 79, temperature (!) 97.1 F (36.2 C), resp. rate 20, height 5' 7 (1.702 m), weight 84.4 kg, SpO2 100%. Body mass index is 29.13 kg/m.  Diagnosis: Principal Problem:   Bipolar disorder [facility])  Clinical Decision Making:Patient with hx of Bipolar disorder recently discharged from geropsych unit 2 weeks ago readmitted for patient reporting reemergence of visual hallucinations. Patient is displaying confusion about his discharge medications and reports staying up all day and night cleaning her house, worsening pedal edema as she is not following the medical recommendations of resting/elevating her feet. Patient needs inpatient hospitalization for stabilization.   Treatment Plan Summary:   Safety and Monitoring:             -- Voluntary  admission to inpatient psychiatric unit for safety, stabilization and treatment             --  Daily contact with patient to assess and evaluate symptoms and progress in treatment             -- Patient's case to be discussed in multi-disciplinary team meeting             -- Observation Level: q15 minute checks             -- Vital signs:  q12 hours             -- Precautions: suicide, elopement, and assault   2. Psychiatric Diagnoses and Treatment:                Discontinued zyprexa  due to metabolic and cardiac burden on geriatric patients. Patient has good response to seroquel  100 gm at bedtime and restarted it.    -- The risks/benefits/side-effects/alternatives to this medication were discussed in detail with the patient and time was given for questions. The patient consents to medication trial.                -- Metabolic profile and EKG monitoring obtained while on an atypical antipsychotic (BMI: Lipid Panel: HbgA1c: QTc:)              -- Encouraged patient to participate in unit milieu and in scheduled group therapies                            3. Medical Issues Being Addressed:   Hospitalist consult for worsening pedal edema- appreciate the recommendations   4. Discharge Planning:   -- Social work and case management to assist with discharge planning and identification of hospital follow-up needs prior to discharge  -- Estimated LOS: 3-4 days  Allyn Foil, MD 11/13/2023, 3:29 PM

## 2023-11-13 NOTE — Group Note (Incomplete)
 LCSW Group Therapy Note  Group Date: 11/13/2023 Start Time: 1400 End Time: 1430   Type of Therapy and Topic:  Group Therapy - How To Cope with Nervousness about Discharge   Participation Level:  {BHH PARTICIPATION OZCZO:[id number]}   Description of Group This process group involved identification of patients' feelings about discharge. Some of them are scheduled to be discharged soon, while others are new admissions, but each of them was asked to share thoughts and feelings surrounding discharge from the hospital. One common theme was that they are excited at the prospect of going home, while another was that many of them are apprehensive about sharing why they were hospitalized. Patients were given the opportunity to discuss these feelings with their peers in preparation for discharge.  Therapeutic Goals  Patient will identify their overall feelings about pending discharge. Patient will think about how they might proactively address issues that they believe will once again arise once they get home (i.e. with parents). Patients will participate in discussion about having hope for change.   Summary of Patient Progress:  *** was very active throughout the session. *** demonstrated *** insight into the subject matter, and proved open to input from peers and feedback from CSW. *** was respectful of peers and participated throughout the entire session.   Therapeutic Modalities Cognitive Behavioral Therapy   Lum JONETTA Raynaldo ISRAEL 11/13/2023  3:38 PM

## 2023-11-13 NOTE — Plan of Care (Signed)
   Problem: Activity: Goal: Interest or engagement in activities will improve Outcome: Progressing Goal: Sleeping patterns will improve Outcome: Progressing

## 2023-11-13 NOTE — Group Note (Signed)
 Date:  11/13/2023 Time:  10:45 AM  Group Topic/Focus:  Fresh air Therapy with music.    Participation Level:  Did Not Attend  Norleen SHAUNNA Bias 11/13/2023, 10:45 AM

## 2023-11-13 NOTE — Progress Notes (Signed)
 Pt alert and oriented. OOB to BR. Speech clear. Amb via rolling walker. Measurement obtained for thigh-high compression stockings ankle 9 1/2, thigh 19, and floor to bend of the knee 17 1/2. Skin warm and dry to touch. No s/s of acute distress noted.  No c/o pain/discomfort noted.

## 2023-11-13 NOTE — Plan of Care (Signed)

## 2023-11-14 DIAGNOSIS — F3111 Bipolar disorder, current episode manic without psychotic features, mild: Secondary | ICD-10-CM

## 2023-11-14 NOTE — Progress Notes (Signed)
   11/14/23 1000  Psych Admission Type (Psych Patients Only)  Admission Status Voluntary  Psychosocial Assessment  Patient Complaints Worrying  Eye Contact Fair  Facial Expression Animated  Affect Appropriate to circumstance  Speech Logical/coherent  Interaction Assertive  Motor Activity Slow  Appearance/Hygiene Unremarkable  Behavior Characteristics Cooperative  Mood Pleasant  Thought Process  Coherency WDL  Content WDL  Delusions None reported or observed  Perception WDL  Hallucination None reported or observed  Judgment Impaired  Confusion Mild  Danger to Self  Current suicidal ideation? Denies  Agreement Not to Harm Self Yes  Description of Agreement verbal  Danger to Others  Danger to Others None reported or observed

## 2023-11-14 NOTE — Progress Notes (Signed)
 [facility] MD Progress Note  11/14/2023 6:38 PM Stephanie Anthony  MRN:  [id number] Stephanie Anthony is a  89 year old woman with a longstanding history of bipolar disorder presented with a recurrence and worsening of visual hallucinations. Patient was recently discharged form inpatient psychiatric unit and reports getting confused about his discharge medications. Patient is admitted to [facility] unit with Q15 min safety monitoring. Multidisciplinary team approach is offered. Medication management; group/milieu therapy is offered.   Subjective:  Chart reviewed, case discussed in multidisciplinary meeting, patient seen during rounds.  Patient is noted to be making friends and having good time with peers. She remains focused on the reason for the admission saying she thought there are other medical doctors too on the unit and not just psychiatry. Patient reports hemorrhoid bleeding and needing surgery. Patient is educated on following with her PCP for surgery referral. She reports improvement of seeing things moving. She is not responding to internal stimuli and when she is with peers she is noted to be very linear and coherent.She denies SI/HI/plans. She has fair appetite and sleep.  Sleep: Fair  Appetite:  Fair  Past Psychiatric History: see h&P Family History:  Family History  Problem Relation Age of Onset   Heart Problems Mother    Seizures Mother    Stroke Mother    Diverticulitis Mother    Alcohol abuse Father    Heart attack Father    Heart attack Sister    Pancreatitis Sister    Cancer Sister    Cancer Brother    Social History:  Social History   Substance and Sexual Activity  Alcohol Use No   Alcohol/week: 0.0 standard drinks of alcohol     Social History   Substance and Sexual Activity  Drug Use No    Social History   Socioeconomic History   Marital status: Widowed    Spouse name: Not on file   Number of children: 2   Years of education: Not on file   Highest education  level: 6th grade  Occupational History   Not on file  Tobacco Use   Smoking status: Former   Smokeless tobacco: Never   Tobacco comments:    quit 1964  Vaping Use   Vaping status: Never Used  Substance and Sexual Activity   Alcohol use: No    Alcohol/week: 0.0 standard drinks of alcohol   Drug use: No   Sexual activity: Not Currently  Other Topics Concern   Not on file  Social History Narrative   Lives at home alone. Son lives in [place].   Social Drivers of Corporate investment banker Strain: Low Risk  (08/05/2022)   Received from [facility]   Overall Financial Resource Strain (CARDIA)    Difficulty of Paying Living Expenses: Not hard at all  Food Insecurity: No Food Insecurity (11/11/2023)   Hunger Vital Sign    Worried About Running Out of Food in the Last Year: Never true    Ran Out of Food in the Last Year: Never true  Transportation Needs: No Transportation Needs (11/11/2023)   PRAPARE - Administrator, Civil Service (Medical): No    Lack of Transportation (Non-Medical): No  Physical Activity: Not on file  Stress: Not on file  Social Connections: Socially Isolated (11/11/2023)   Social Connection and Isolation Panel    Frequency of Communication with Friends and Family: More than three times a week    Frequency of Social Gatherings with  Friends and Family: More than three times a week    Attends Religious Services: Never    Database administrator or Organizations: No    Attends Banker Meetings: Never    Marital Status: Widowed   Past Medical History:  Past Medical History:  Diagnosis Date   Anxiety    Arthritis    Bipolar 1 disorder (HCC)    Cataracts, bilateral    Chronic kidney disease    stage 3   Chronic kidney disease    Chronic mitral valve disease    COPD (chronic obstructive pulmonary disease) (HCC)    Coronary artery disease    Deviated nasal septum    Erosive esophagitis    Erosive esophagitis     Esophageal stricture    Fibroid tumor    GERD (gastroesophageal reflux disease)    History of hiatal hernia    Hypertension    Hypothyroidism    Murmur, cardiac    Nephrogenic diabetes insipidus (HCC)    Pure hypercholesterolemia    Renal cyst, left    bilateral   Seasonal allergies     Past Surgical History:  Procedure Laterality Date   APPENDECTOMY     CARDIAC CATHETERIZATION     CHOLECYSTECTOMY     COLONOSCOPY WITH PROPOFOL  N/A 06/01/2018   Procedure: COLONOSCOPY WITH PROPOFOL ;  Surgeon: [name], MD;  Location[facility] ENDOSCOPY;  Service: Endoscopy;  Laterality: N/A;   ESOPHAGEAL DILATION     ESOPHAGOGASTRODUODENOSCOPY (EGD) WITH PROPOFOL  N/A 10/20/2014   Procedure: ESOPHAGOGASTRODUODENOSCOPY (EGD) WITH PROPOFOL ;  Surgeon: Gladis PENNER Gaylyn, MD;  Location: [facility] ENDOSCOPY;  Service: Endoscopy;  Laterality: N/A;   ESOPHAGOGASTRODUODENOSCOPY (EGD) WITH PROPOFOL  N/A 06/01/2018   Procedure: ESOPHAGOGASTRODUODENOSCOPY (EGD) WITH PROPOFOL ;  Surgeon: [name], MD;  Location: [facility] ENDOSCOPY;  Service: Endoscopy;  Laterality: N/A;   INFECTED SKIN DEBRIDEMENT     on back several years ago   JOINT REPLACEMENT     KNEE ARTHROPLASTY Left 06/10/2023   Procedure: COMPUTER ASSISTED TOTAL KNEE ARTHROPLASTY;  Surgeon: Mardee Lynwood SQUIBB, MD;  Location: [facility] ORS;  Service: Orthopedics;  Laterality: Left;   knee replacement, right     LAPAROSCOPIC APPENDECTOMY N/A 06/21/2018   Procedure: APPENDECTOMY LAPAROSCOPIC;  Surgeon: [name], MD;  Location: [facility] ORS;  Service: General;  Laterality: N/A;   NOSE SURGERY     THYROIDECTOMY     TOTAL THYROIDECTOMY      Current Medications: Current Facility-Administered Medications  Medication Dose Route Frequency Provider Last Rate Last Admin   acetaminophen  (TYLENOL ) tablet 650 mg  650 mg Oral Q6H PRN Penn, Cicely, NP   650 mg at 11/14/23 0902   alum & mag hydroxide-simeth (MAALOX/MYLANTA) 200-200-20 MG/5ML suspension 30 mL   30 mL Oral Q4H PRN Penn, Reymundo, NP   30 mL at 11/12/23 0124   gabapentin  (NEURONTIN ) capsule 100 mg  100 mg Oral TID Sreenath, Sudheer B, MD   100 mg at 11/14/23 1720   hydrocortisone  (ANUSOL -HC) 2.5 % rectal cream   Rectal QID Josue Falconi, MD   Given at 11/14/23 1719   lamoTRIgine  (LAMICTAL ) tablet 125 mg  125 mg Oral Daily Penn, Cicely, NP   125 mg at 11/14/23 0903   latanoprost  (XALATAN ) 0.005 % ophthalmic solution 1 drop  1 drop Both Eyes QHS Penn, Cicely, NP   1 drop at 11/13/23 2137   levothyroxine  (SYNTHROID ) tablet 112 mcg  112 mcg Oral Q0600 Sammy Reymundo, NP   112 mcg at 11/14/23 0556  magnesium  hydroxide (MILK OF MAGNESIA) suspension 15 mL  15 mL Oral Daily PRN Penn, Reymundo, NP       melatonin tablet 2.5 mg  2.5 mg Oral QHS Trudy Carwin, NP   2.5 mg at 11/13/23 2209   multivitamin with minerals tablet 1 tablet  1 tablet Oral Daily Niels Kayla FALCON, RPH   1 tablet at 11/14/23 9097   OLANZapine  (ZYPREXA ) injection 5 mg  5 mg Intramuscular TID PRN Sammy Reymundo, NP       OLANZapine  zydis (ZYPREXA ) disintegrating tablet 5 mg  5 mg Oral TID PRN Sammy Reymundo, NP   5 mg at 11/13/23 0139   pravastatin  (PRAVACHOL ) tablet 10 mg  10 mg Oral q1800 Penn, Reymundo, NP   10 mg at 11/14/23 1720   QUEtiapine  (SEROQUEL ) tablet 100 mg  100 mg Oral QHS Rufina Kimery, MD   100 mg at 11/13/23 2136    Lab Results: No results found for this or any previous visit (from the past 48 hours).  Blood Alcohol level:  Lab Results  Component Value Date   [facility] <15 11/10/2023   ETH <15 10/27/2023    Metabolic Disorder Labs: Lab Results  Component Value Date   HGBA1C 5.8 (H) 10/27/2023   MPG 119.76 10/27/2023   No results found for: PROLACTIN No results found for: CHOL, TRIG, HDL, CHOLHDL, VLDL, LDLCALC  Physical Findings: AIMS:  , ,  ,  ,    CIWA:    COWS:      Psychiatric Specialty Exam:  Presentation  General Appearance:  Appropriate for Environment; Casual  Eye  Contact: Fair  Speech: Normal Rate  Speech Volume: Normal    Mood and Affect  Mood: Anxious  Affect: Appropriate   Thought Process  Thought Processes: Coherent  Descriptions of Associations:Intact  Orientation:Full (Time, Place and Person)  Thought Content:Illogical  Hallucinations:denies  Ideas of Reference:None  Suicidal Thoughts:denies  Homicidal Thoughts:denies   Sensorium  Memory: Immediate Fair; Recent Fair; Remote Fair  Judgment: Impaired  Insight: Shallow   Executive Functions  Concentration: Fair  Attention Span: Fair  Recall: [employer] of Knowledge: Fair  Language: Fair   Psychomotor Activity  Psychomotor Activity: No data recorded  Musculoskeletal: Strength & Muscle Tone: decreased Gait & Station: unsteady Assets  Assets: Desire for Improvement; Resilience; Social Support    Physical Exam: Physical Exam Vitals and nursing note reviewed.    ROS Blood pressure 135/63, pulse 81, temperature 97.8 F (36.6 C), resp. rate 17, height 5' 7 (1.702 m), weight 84.4 kg, SpO2 100%. Body mass index is 29.13 kg/m.  Diagnosis: Principal Problem:   Bipolar disorder [facility])  Clinical Decision Making:Patient with hx of Bipolar disorder recently discharged from geropsych unit 2 weeks ago readmitted for patient reporting reemergence of visual hallucinations. Patient is displaying confusion about his discharge medications and reports staying up all day and night cleaning her house, worsening pedal edema as she is not following the medical recommendations of resting/elevating her feet. Patient needs inpatient hospitalization for stabilization.   Treatment Plan Summary:   Safety and Monitoring:             -- Voluntary admission to inpatient psychiatric unit for safety, stabilization and treatment             -- Daily contact with patient to assess and evaluate symptoms and progress in treatment             -- Patient's case to be  discussed in multi-disciplinary team  meeting             -- Observation Level: q15 minute checks             -- Vital signs:  q12 hours             -- Precautions: suicide, elopement, and assault   2. Psychiatric Diagnoses and Treatment:              Lamictal  125 mg daily  Discontinued zyprexa  due to metabolic and cardiac burden on geriatric patients. Patient has good response to seroquel  100 gm at bedtime and restarted it.    -- The risks/benefits/side-effects/alternatives to this medication were discussed in detail with the patient and time was given for questions. The patient consents to medication trial.                -- Metabolic profile and EKG monitoring obtained while on an atypical antipsychotic (BMI: Lipid Panel: HbgA1c: QTc:)              -- Encouraged patient to participate in unit milieu and in scheduled group therapies                            3. Medical Issues Being Addressed:   Hospitalist consult for worsening pedal edema- appreciate the recommendations   4. Discharge Planning:   -- Social work and case management to assist with discharge planning and identification of hospital follow-up needs prior to discharge  -- Estimated LOS: 3-4 days  Crews Mccollam, MD 11/14/2023, 6:38 PM

## 2023-11-14 NOTE — [facility] Suicide Risk Assessment (Signed)
 [facility] INPATIENT:  Family/Significant Other Suicide Prevention Education  Suicide Prevention Education:  Contact Attempts: Stephanie Anthony, son [phone], (name of family member/significant other) has been identified by the patient as the family member/significant other with whom the patient will be residing, and identified as the person(s) who will aid the patient in the event of a mental health crisis.  With written consent from the patient, two attempts were made to provide suicide prevention education, prior to and/or following the patient's discharge.  We were unsuccessful in providing suicide prevention education.  A suicide education pamphlet was given to the patient to share with family/significant other.  Date and time of first attempt: 11/14/23 at 12:30 pm Date and time of second attempt: second attempt needed  Stephanie Anthony 11/14/2023, 12:30 PM

## 2023-11-14 NOTE — Group Note (Signed)
 Date:  11/14/2023 Time:  8:56 PM  Group Topic/Focus:  Coping With Mental Health Crisis:   The purpose of this group is to help patients identify strategies for coping with mental health crisis.  Group discusses possible causes of crisis and ways to manage them effectively.    Participation Level:  Active  Participation Quality:  Appropriate  Affect:  Appropriate  Cognitive:  Appropriate  Insight: Appropriate  Engagement in Group:  Engaged  Modes of Intervention:  Support  Additional Comments:  n/a  Derrick Orris L 11/14/2023, 8:56 PM

## 2023-11-14 NOTE — Group Note (Signed)
 Date:  11/14/2023 Time:  10:49 AM  Group Topic/Focus:  Fresh air therapy outdoors with music.    Participation Level:  Active  Participation Quality:  Appropriate  Affect:  Appropriate  Cognitive:  Appropriate  Insight: Appropriate  Engagement in Group:  Engaged  Modes of Intervention:  Socialization and music  Additional Comments:  none  Norleen SHAUNNA Bias 11/14/2023, 10:49 AM

## 2023-11-14 NOTE — Progress Notes (Signed)
   11/14/23 0200  Psych Admission Type (Psych Patients Only)  Admission Status Voluntary  Psychosocial Assessment  Patient Complaints None  Eye Contact Fair  Facial Expression Flat;Animated  Affect Appropriate to circumstance  Speech Logical/coherent  Interaction Assertive  Motor Activity Slow  Appearance/Hygiene Unremarkable  Behavior Characteristics Appropriate to situation  Mood Pleasant  Thought Process  Coherency WDL  Content WDL  Delusions None reported or observed  Perception WDL  Hallucination None reported or observed  Judgment Impaired  Confusion Mild  Danger to Self  Current suicidal ideation? Denies  Agreement Not to Harm Self Yes

## 2023-11-14 NOTE — Plan of Care (Signed)
   Problem: Education: Goal: Emotional status will improve Outcome: Progressing Goal: Mental status will improve Outcome: Progressing   Problem: Activity: Goal: Interest or engagement in activities will improve Outcome: Progressing   Problem: Safety: Goal: Periods of time without injury will increase Outcome: Progressing

## 2023-11-14 NOTE — Plan of Care (Signed)

## 2023-11-15 DIAGNOSIS — F3111 Bipolar disorder, current episode manic without psychotic features, mild: Secondary | ICD-10-CM | POA: Diagnosis not present

## 2023-11-15 MED ORDER — ADULT MULTIVITAMIN W/MINERALS CH: 1.0000 | ORAL_TABLET | Freq: Every day | ORAL | 0 refills | Status: AC

## 2023-11-15 MED ORDER — LATANOPROST 0.005 % OP SOLN: 1.0000 [drp] | Freq: Every day | OPHTHALMIC | 12 refills | Status: AC

## 2023-11-15 MED ORDER — QUETIAPINE FUMARATE 100 MG PO TABS: 100.0000 mg | ORAL_TABLET | Freq: Every day | ORAL | 0 refills | Status: DC

## 2023-11-15 MED ORDER — LEVOTHYROXINE SODIUM 112 MCG PO TABS: 112.0000 ug | ORAL_TABLET | Freq: Every day | ORAL | 0 refills | Status: AC

## 2023-11-15 MED ORDER — GABAPENTIN 100 MG PO CAPS: 100.0000 mg | ORAL_CAPSULE | Freq: Three times a day (TID) | ORAL | 0 refills | Status: DC

## 2023-11-15 MED ORDER — LAMOTRIGINE 25 MG PO TABS: 125.0000 mg | ORAL_TABLET | Freq: Every day | ORAL | 0 refills | Status: DC

## 2023-11-15 NOTE — [facility] Suicide Risk Assessment (Signed)
 [facility] Discharge Suicide Risk Assessment   Principal Problem: Bipolar disorder [facility]) Discharge Diagnoses: Principal Problem:   Bipolar disorder (HCC)   Total Time spent with patient: 30 minutes  Musculoskeletal: Strength & Muscle Tone: within normal limits Gait & Station: normal Patient leans: N/A  Psychiatric Specialty Exam  Presentation  General Appearance:  Appropriate for Environment; Casual  Eye Contact: Minimal  Speech: Clear and Coherent  Speech Volume: Normal  Handedness: Right   Mood and Affect  Mood: Euthymic  Duration of Depression Symptoms: Greater than two weeks  Affect: Appropriate   Thought Process  Thought Processes: Coherent  Descriptions of Associations:Intact  Orientation:Partial  Thought Content:Logical  History of Schizophrenia/Schizoaffective disorder:No  Duration of Psychotic Symptoms:Greater than six months  Hallucinations:Hallucinations: None  Ideas of Reference:None  Suicidal Thoughts:Suicidal Thoughts: No  Homicidal Thoughts:Homicidal Thoughts: No   Sensorium  Memory: Immediate Fair; Recent Fair; Remote Poor  Judgment: Fair  Insight: Fair   Chartered certified accountant: Fair  Attention Span: Fair  Recall: [employer] of Knowledge: Fair  Language: Fair   Psychomotor Activity  Psychomotor Activity: Psychomotor Activity: Normal   Assets  Assets: Communication Skills; Social Support; Resilience   Sleep  Sleep: Sleep: Fair  Estimated Sleeping Duration (Last 24 Hours): 6.50-7.25 hours  Physical Exam: Physical Exam ROS Blood pressure (!) 149/58, pulse 71, temperature (!) 97.3 F (36.3 C), resp. rate 18, height 5' 7 (1.702 m), weight 84.4 kg, SpO2 100%. Body mass index is 29.13 kg/m.  Mental Status Per Nursing Assessment::   On Admission:  NA  Demographic Factors:  Caucasian  Loss Factors: Decrease in vocational status  Historical Factors: Impulsivity  Risk  Reduction Factors:   Positive social support, Positive therapeutic relationship, and Positive coping skills or problem solving skills  Continued Clinical Symptoms:  Bipolar Disorder:   Bipolar II  Cognitive Features That Contribute To Risk:  None    Suicide Risk:  Minimal: No identifiable suicidal ideation.  Patients presenting with no risk factors but with morbid ruminations; may be classified as minimal risk based on the severity of the depressive symptoms   Follow-up Information     [facility]  Follow up.   Why: Your appointment is scheduled for 11/19/23 at 1:30 PM with your provider, Dr.Hisada                Plan Of Care/Follow-up recommendations:  Activity:  As tolerated  Allyn Foil, MD 11/15/2023, 9:26 AM
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
## 2023-11-15 NOTE — Progress Notes (Signed)
  [facility] Adult Case Management Discharge Plan :  Will you be returning to the same living situation after discharge:  Yes,  patient to return home.  At discharge, do you have transportation home?: Yes,  patient's friend to provide transportation at discharge.  Do you have the ability to pay for your medications: Yes,  MEDICARE / MEDICARE PART A AND B  Release of information consent forms completed and in the chart;  Patient's signature needed at discharge.  Patient to Follow up at:  Follow-up Information     [facility]  Follow up.   Why: Your appointment is scheduled for 11/19/23 at 1:30 PM with your provider, Dr.Hisada                Next level of care provider has access to [facility] Link:yes  Safety Planning and Suicide Prevention discussed: Yes,  Education Completed; Tanner Vigna, son [phone], has been identified by the patient as the family member/significant other with whom the patient will be residing, and identified as the person(s) who will aid the patient in the event of a mental health crisis (suicidal ideations/suicide attempt).  With written consent from the patient, the family member/significant other has been provided the following suicide prevention education, prior to the and/or following the discharge of the patient.     Has patient been referred to the Quitline?: Patient refused referral for treatment  Patient has been referred for addiction treatment: No known substance use disorder.  Alveta CHRISTELLA Kerns, LCSW 11/15/2023, 9:54 AM

## 2023-11-15 NOTE — Group Note (Signed)
 Date:  11/15/2023 Time:  11:07 AM  Group Topic/Focus:  Building Self Esteem:   The Focus of this group is helping patients become aware of the effects of self-esteem on their lives, the things they and others do that enhance or undermine their self-esteem, seeing the relationship between their level of self-esteem and the choices they make and learning ways to enhance self-esteem.    Participation Level:  Did Not Attend   Stephanie Anthony 11/15/2023, 11:07 AM

## 2023-11-15 NOTE — Progress Notes (Signed)
   11/14/23 2200  Psych Admission Type (Psych Patients Only)  Admission Status Voluntary  Psychosocial Assessment  Patient Complaints None  Eye Contact Fair  Facial Expression Animated  Affect Appropriate to circumstance  Speech Logical/coherent  Interaction Assertive  Motor Activity Slow  Appearance/Hygiene Unremarkable  Behavior Characteristics Cooperative  Mood Pleasant  Thought Process  Coherency WDL  Content WDL  Delusions None reported or observed  Perception WDL  Hallucination None reported or observed  Judgment Impaired  Confusion Mild  Danger to Self  Current suicidal ideation? Denies  Agreement Not to Harm Self Yes  Danger to Others  Danger to Others None reported or observed

## 2023-11-15 NOTE — BHH Counselor (Signed)
 CSW spoke with patient to engage in safe discharge planning. The patient says that Her friend, Garvin will come to pick her up as she has her keys and purse. Patient provided CSW with consent to reach out to her son to be provided her friend's number as she was unable to recall.   This was completed by CSW. Son connected CSW to Ms. Rhoda at [phone]. Ms. Garvin confirmed that she will come to get the patient at discharge but that it has to be before 12:30 or after 5 PM. She also confirmed that she does have the patient's house keys and purse.   Patient's son reports that he has no safety concerns and feels that the patient is completely fine on her own at home. He plans to follow up with the patient tomorrow.   This has been communicated to team. CSW to continue to assess.   Selig Wampole, MSW, LCSWA 11/15/2023 9:53 AM

## 2023-11-15 NOTE — Plan of Care (Signed)
   Problem: Education: Goal: Emotional status will improve Outcome: Progressing Goal: Mental status will improve Outcome: Progressing   Problem: Activity: Goal: Interest or engagement in activities will improve Outcome: Progressing

## 2023-11-15 NOTE — Progress Notes (Signed)
 Patient ID: Stephanie Anthony, female   DOB: 04-21-1934, 89 y.o.   MRN: [id number]  Patient was discharged from [facility] unit at approx 1105 escorted by staff. Patient denies SI/HI/AVH. Discharge packet to include printed AVS, 1 printed prescription, Suicide Risk Assessment, and Transition Record reviewed with patient. Belongings to include eyeglasses returned. Suicide safety plan unable to be completed.

## 2023-11-15 NOTE — Care Management Important Message (Signed)
 Important Message  Patient Details  Name: Aprile Dickenson MRN: [id number] Date of Birth: 07/28/1933   Medicare Important Message Given:  Yes - Medicare IM     Jameila Keeny M Catalia Massett, LCSW 11/15/2023, 9:57 AM

## 2023-11-15 NOTE — BHH Suicide Risk Assessment (Signed)
 B[facility] INPATIENT:  Family/Significant Other Suicide Prevention Education  Suicide Prevention Education:  Education Completed; Hendy Brindle, son [phone], has been identified by the patient as the family member/significant other with whom the patient will be residing, and identified as the person(s) who will aid the patient in the event of a mental health crisis (suicidal ideations/suicide attempt).  With written consent from the patient, the family member/significant other has been provided the following suicide prevention education, prior to the and/or following the discharge of the patient.  The suicide prevention education provided includes the following: Suicide risk factors Suicide prevention and interventions National Suicide Hotline telephone number [facility] assessment telephone number [facility] Emergency Assistance 911 [facility] and/or Residential Mobile Crisis Unit telephone number  Request made of family/significant other to: Remove weapons (e.g., guns, rifles, knives), all items previously/currently identified as safety concern.   Remove drugs/medications (over-the-counter, prescriptions, illicit drugs), all items previously/currently identified as a safety concern.  The family member/significant other verbalizes understanding of the suicide prevention education information provided.  The family member/significant other agrees to remove the items of safety concern listed above.  According to son, he has traveled back home to [facility] as he has to work in the morning. Son reports that during his visit with the patient yesterday, she expressed that a friend was to provide transportation at discharge. Son believes that the patient is capable of living on her own and reports that "she is usually fine unless off of her medications." Son reports that the patient has no access to weapons. He reports no safety concerns at this time. Son reports that he  plans to follow up daily with patient following discharge.   Stephanie Anthony 11/15/2023, 9:19 AM

## 2023-11-15 NOTE — Discharge Summary (Signed)
 Physician Discharge Summary Note  Patient:  Stephanie Anthony is an 89 y.o., female MRN:  [id number] DOB:  [birth year] Patient phone:  [phone] (home)  Patient address:   [address] [place] [address],    Date of Admission:  11/11/2023 Date of Discharge: 11/15/23  Reason for Admission:  Stephanie Anthony is a  59 year old woman with a longstanding history of bipolar disorder presented with a recurrence and worsening of visual hallucinations. Patient was recently discharged form inpatient psychiatric unit and reports getting confused about his discharge medications. Patient is admitted to [facility] unit with Q15 min safety monitoring. Multidisciplinary team approach is offered. Medication management; group/milieu therapy is offered.   Principal Problem: Bipolar disorder [facility]) Discharge Diagnoses: Principal Problem:   Bipolar disorder (HCC)   Past Psychiatric History: see h&p  Family Psychiatric  History: see h&p Social History:  Social History   Substance and Sexual Activity  Alcohol Use No   Alcohol/week: 0.0 standard drinks of alcohol     Social History   Substance and Sexual Activity  Drug Use No    Social History   Socioeconomic History   Marital status: Widowed    Spouse name: Not on file   Number of children: 2   Years of education: Not on file   Highest education level: 6th grade  Occupational History   Not on file  Tobacco Use   Smoking status: Former   Smokeless tobacco: Never   Tobacco comments:    quit 1964  Vaping Use   Vaping status: Never Used  Substance and Sexual Activity   Alcohol use: No    Alcohol/week: 0.0 standard drinks of alcohol   Drug use: No   Sexual activity: Not Currently  Other Topics Concern   Not on file  Social History Narrative   Lives at home alone. Son lives in [place].   Social Drivers of Corporate investment banker Strain: Low Risk  (08/05/2022)   Received from [facility]    Overall Financial Resource Strain (CARDIA)    Difficulty of Paying Living Expenses: Not hard at all  Food Insecurity: No Food Insecurity (11/11/2023)   Hunger Vital Sign    Worried About Running Out of Food in the Last Year: Never true    Ran Out of Food in the Last Year: Never true  Transportation Needs: No Transportation Needs (11/11/2023)   PRAPARE - Administrator, Civil Service (Medical): No    Lack of Transportation (Non-Medical): No  Physical Activity: Not on file  Stress: Not on file  Social Connections: Socially Isolated (11/11/2023)   Social Connection and Isolation Panel    Frequency of Communication with Friends and Family: More than three times a week    Frequency of Social Gatherings with Friends and Family: More than three times a week    Attends Religious Services: Never    Database administrator or Organizations: No    Attends Banker Meetings: Never    Marital Status: Widowed   Past Medical History:  Past Medical History:  Diagnosis Date   Anxiety    Arthritis    Bipolar 1 disorder (HCC)    Cataracts, bilateral    Chronic kidney disease    stage 3   Chronic kidney disease    Chronic mitral valve disease    COPD (chronic obstructive pulmonary disease) (HCC)    Coronary artery disease    Deviated nasal septum  Erosive esophagitis    Erosive esophagitis    Esophageal stricture    Fibroid tumor    GERD (gastroesophageal reflux disease)    History of hiatal hernia    Hypertension    Hypothyroidism    Murmur, cardiac    Nephrogenic diabetes insipidus (HCC)    Pure hypercholesterolemia    Renal cyst, left    bilateral   Seasonal allergies     Past Surgical History:  Procedure Laterality Date   APPENDECTOMY     CARDIAC CATHETERIZATION     CHOLECYSTECTOMY     COLONOSCOPY WITH PROPOFOL  N/A 06/01/2018   Procedure: COLONOSCOPY WITH PROPOFOL ;  Surgeon: [name], MD;  Location: [facility] ENDOSCOPY;  Service: Endoscopy;   Laterality: N/A;   ESOPHAGEAL DILATION     ESOPHAGOGASTRODUODENOSCOPY (EGD) WITH PROPOFOL  N/A 10/20/2014   Procedure: ESOPHAGOGASTRODUODENOSCOPY (EGD) WITH PROPOFOL ;  Surgeon: Gladis PENNER Gaylyn, MD;  Location: [facility] ENDOSCOPY;  Service: Endoscopy;  Laterality: N/A;   ESOPHAGOGASTRODUODENOSCOPY (EGD) WITH PROPOFOL  N/A 06/01/2018   Procedure: ESOPHAGOGASTRODUODENOSCOPY (EGD) WITH PROPOFOL ;  Surgeon: [name], MD;  Location: [facility] ENDOSCOPY;  Service: Endoscopy;  Laterality: N/A;   INFECTED SKIN DEBRIDEMENT     on back several years ago   JOINT REPLACEMENT     KNEE ARTHROPLASTY Left 06/10/2023   Procedure: COMPUTER ASSISTED TOTAL KNEE ARTHROPLASTY;  Surgeon: Mardee Lynwood SQUIBB, MD;  Location: [facility] ORS;  Service: Orthopedics;  Laterality: Left;   knee replacement, right     LAPAROSCOPIC APPENDECTOMY N/A 06/21/2018   Procedure: APPENDECTOMY LAPAROSCOPIC;  Surgeon: [name], MD;  Location: [facility] ORS;  Service: General;  Laterality: N/A;   NOSE SURGERY     THYROIDECTOMY     TOTAL THYROIDECTOMY     Family History:  Family History  Problem Relation Age of Onset   Heart Problems Mother    Seizures Mother    Stroke Mother    Diverticulitis Mother    Alcohol abuse Father    Heart attack Father    Heart attack Sister    Pancreatitis Sister    Cancer Sister    Cancer Brother     Hospital Course:  Stephanie Anthony is a  80 year old woman with a longstanding history of bipolar disorder presented with a recurrence and worsening of visual hallucinations. Patient was recently discharged form inpatient psychiatric unit and reports getting confused about his discharge medications. Patient is admitted to [facility] unit with Q15 min safety monitoring. Multidisciplinary team approach is offered. Medication management; group/milieu therapy is offered.   On assessment patient is noted to be confused about her previous discharge medications.  She was restarted on home medication Seroquel  100 mg  nightly, Lamictal  1.5 mg daily for mood stabilization.  She is actually noted to be at her baseline.  Patient was endorsing visual hallucinations of things moving around on the wall but when she is seen to be with her peers she is not responding to any stimuli and her behaviors do not reflect somebody going through visual hallucinations.  She told her family and the staff that she likes the geropsych unit and that she wants to come back to be with her friends.  Provider educated patient that the hospital is a acute care facility and if she feels unsafe in her house should discuss the plan of assisted living with her family.  This provider and nurse manager did meet up with patient's son and discussed the possibility of assisted living.  Son informed the team that patient has good support  system and actually aide coming into the house helping with food, taking her out for her appointments.  Patient has medical history of hemorrhoids, chronic and she is already scheduled to have a surgery soon.  She also has history of lower extremity edema and hospitalist consult was called again.  The following are the recommendations: BL Lower extremity swelling The differential somewhat broad however given recent reassuring cardiac workup I do not feel that the bilateral lower extremity edema is representative of cardiogenic fluid overload.  My feel this is a combination of chronic venous insufficiency as well as possible contribution from hypoalbuminemia and poor nutrition.   Recommendations: Avoid diuretics given tenuous kidney function Leg elevation when seated Bilateral thigh-high compression stockings Low-dose gabapentin  100 mg 3 times daily RD consultation for nutritional evaluation   2.  Shortness of breath Very minimal.  Not observed on my evaluation.  Patient not noted to have any conversational dyspnea.  Lungs clear on exam.   3.  Chronic kidney disease stage IIIb-IV Kidney function better than baseline.   Creatinine 1.6 at this time.  No urgent need for hemodialysis.  Patient is established with outpatient nephrology.  Recommend outpatient follow-up.  Recommend avoidance of nonessential nephrotoxins.    On the day of discharge patient is noted to be doing very well, consistently denies SI/HI/plan, not responding to internal stimuli and denies hallucinations.  She is taking her medications with no reported side effects. Detailed risk assessment is complete based on clinical exam and individual risk factors and acute suicide risk is low and acute violence risk is low.     Currently, all modifiable risk of harm to self/harm to others have been addressed and patient is no longer appropriate for the acute inpatient setting and is able to continue treatment for mental health needs in the community with the supports as indicated below.  Patient is educated and verbalized understanding of discharge plan of care including medications, follow-up appointments, mental health resources and further crisis services in the community.  Patient is instructed to call 911 or present to the nearest emergency room should she experience any decompensation in mood, disturbance of bowel or return of suicidal/homicidal ideations.  Patient verbalizes understanding of this education and agrees to this plan of care  Physical Findings: AIMS:  , ,  ,  ,    CIWA:    COWS:        Psychiatric Specialty Exam:  Presentation  General Appearance:  Appropriate for Environment; Casual  Eye Contact: Minimal  Speech: Clear and Coherent  Speech Volume: Normal    Mood and Affect  Mood: Euthymic  Affect: Appropriate   Thought Process  Thought Processes: Coherent  Descriptions of Associations:Intact  Orientation:Partial  Thought Content:Logical  Hallucinations:Hallucinations: None  Ideas of Reference:None  Suicidal Thoughts:Suicidal Thoughts: No  Homicidal Thoughts:Homicidal Thoughts: No   Sensorium   Memory: Immediate Fair; Recent Fair; Remote Poor  Judgment: Fair  Insight: Fair   Chartered certified accountant: Fair  Attention Span: Fair  Recall: [employer] of Knowledge: Fair  Language: Fair   Psychomotor Activity  Psychomotor Activity: Psychomotor Activity: Normal  Musculoskeletal: Strength & Muscle Tone: decreased Gait & Station: unsteady Assets  Assets: Manufacturing systems engineer; Social Support; Resilience   Sleep  Sleep: Sleep: Fair    Physical Exam: Physical Exam ROS Blood pressure (!) 149/58, pulse 71, temperature (!) 97.3 F (36.3 C), resp. rate 18, height 5' 7 (1.702 m), weight 84.4 kg, SpO2 100%. Body mass index is 29.13  kg/m.   Social History   Tobacco Use  Smoking Status Former  Smokeless Tobacco Never  Tobacco Comments   quit 1964   Tobacco Cessation:  N/A, patient does not currently use tobacco products   Blood Alcohol level:  Lab Results  Component Value Date   [facility] <15 11/10/2023   ETH <15 10/27/2023    Metabolic Disorder Labs:  Lab Results  Component Value Date   HGBA1C 5.8 (H) 10/27/2023   MPG 119.76 10/27/2023   No results found for: PROLACTIN No results found for: CHOL, TRIG, HDL, CHOLHDL, VLDL, LDLCALC  See Psychiatric Specialty Exam and Suicide Risk Assessment completed by Attending Physician prior to discharge.  Discharge destination:  Home  Is patient on multiple antipsychotic therapies at discharge:  No   Has Patient had three or more failed trials of antipsychotic monotherapy by history:  No  Recommended Plan for Multiple Antipsychotic Therapies: NA   Allergies as of 11/15/2023       Reactions   Aspirin  Other (See Comments)   Rectal Bleeding   Prednisone Other (See Comments)   Hallucinations   Codeine Nausea And Vomiting   Valium [diazepam] Other (See Comments)   Uknown        Medication List     STOP taking these medications    ARIPiprazole  2 MG tablet Commonly  known as: ABILIFY        TAKE these medications      Indication  gabapentin  100 MG capsule Commonly known as: NEURONTIN  Take 1 capsule (100 mg total) by mouth 3 (three) times daily.    hydrocortisone  2.5 % rectal cream Commonly known as: ANUSOL -HC Place rectally 4 (four) times daily.    lamoTRIgine  25 MG tablet Commonly known as: LAMICTAL  Take 5 tablets (125 mg total) by mouth daily. What changed:  how much to take Another medication with the same name was removed. Continue taking this medication, and follow the directions you see here.    latanoprost  0.005 % ophthalmic solution Commonly known as: XALATAN  Place 1 drop into both eyes at bedtime.    levothyroxine  112 MCG tablet Commonly known as: SYNTHROID  Take 1 tablet (112 mcg total) by mouth daily at 6 (six) AM.    losartan  25 MG tablet Commonly known as: COZAAR  Take 25 mg by mouth daily.    multivitamin with minerals Tabs tablet Take 1 tablet by mouth daily.    pantoprazole  40 MG tablet Commonly known as: PROTONIX  Take 40 mg by mouth daily.    pravastatin  10 MG tablet Commonly known as: PRAVACHOL  Take 1 tablet (10 mg total) by mouth daily at 6 PM.    QUEtiapine  100 MG tablet Commonly known as: SEROQUEL  Take 1 tablet (100 mg total) by mouth at bedtime. What changed: Another medication with the same name was removed. Continue taking this medication, and follow the directions you see here.         Follow-up Information     [facility]  Follow up.   Why: Your appointment is scheduled for 11/19/23 at 1:30 PM with your provider, Dr.Hisada                Follow-up recommendations:  Activity:  As tolerated    Signed: Hayes Rehfeldt, MD 11/15/2023, 9:27 AM

## 2023-11-19 ENCOUNTER — Other Ambulatory Visit: Payer: Self-pay

## 2023-11-19 ENCOUNTER — Encounter: Payer: Self-pay | Admitting: Psychiatry

## 2023-11-19 ENCOUNTER — Ambulatory Visit (INDEPENDENT_AMBULATORY_CARE_PROVIDER_SITE_OTHER): Admitting: Psychiatry

## 2023-11-19 VITALS — BP 139/69 | HR 80 | Temp 97.0°F | Ht 67.0 in | Wt 199.2 lb

## 2023-11-19 DIAGNOSIS — F3181 Bipolar II disorder: Secondary | ICD-10-CM

## 2023-11-19 DIAGNOSIS — G20C Parkinsonism, unspecified: Secondary | ICD-10-CM | POA: Diagnosis not present

## 2023-11-19 DIAGNOSIS — Z79899 Other long term (current) drug therapy: Secondary | ICD-10-CM | POA: Diagnosis not present

## 2023-11-19 MED ORDER — QUETIAPINE FUMARATE 25 MG PO TABS: 25.0000 mg | ORAL_TABLET | Freq: Every day | ORAL | 0 refills | Status: DC

## 2023-11-19 NOTE — Patient Instructions (Signed)
 Continue lamotrigine  125 mg daily  Increase quetiapine  125 mg at night  Next appointment: 7/15 at 8 AM

## 2023-11-27 ENCOUNTER — Other Ambulatory Visit: Payer: Self-pay | Admitting: Psychiatry
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
## 2023-12-01 ENCOUNTER — Other Ambulatory Visit: Payer: Self-pay

## 2023-12-01 ENCOUNTER — Encounter: Payer: Self-pay | Admitting: Psychiatry

## 2023-12-01 ENCOUNTER — Ambulatory Visit (INDEPENDENT_AMBULATORY_CARE_PROVIDER_SITE_OTHER): Admitting: Psychiatry

## 2023-12-01 VITALS — BP 124/66 | HR 84 | Temp 97.9°F | Ht 67.0 in | Wt 194.2 lb

## 2023-12-01 DIAGNOSIS — F319 Bipolar disorder, unspecified: Secondary | ICD-10-CM

## 2023-12-01 DIAGNOSIS — R5383 Other fatigue: Secondary | ICD-10-CM | POA: Diagnosis not present

## 2023-12-01 DIAGNOSIS — G4700 Insomnia, unspecified: Secondary | ICD-10-CM

## 2023-12-01 DIAGNOSIS — D509 Iron deficiency anemia, unspecified: Secondary | ICD-10-CM | POA: Diagnosis not present

## 2023-12-01 DIAGNOSIS — Z79899 Other long term (current) drug therapy: Secondary | ICD-10-CM

## 2023-12-01 DIAGNOSIS — G20C Parkinsonism, unspecified: Secondary | ICD-10-CM

## 2023-12-01 NOTE — Patient Instructions (Addendum)
 Continue lamotrigine  100 mg daily  Continue quetiapine  125 mg at night  Next appointment: 8/11 at 11:30  She reports feeling cold and experiencing increased daytime sleepiness. She also continues to have hematochezia, which may be contributing to her fatigue and history of anemia.  She is currently taking levothyroxine  100 mcg, though her discharge instructions indicated a dose of 112 mcg. It may be helpful to revisit her thyroid  management to ensure she's on the optimal dose.  She is also on gabapentin , which was prescribed for leg edema per the record. Given her ongoing drowsiness, would you be open to reassessing whether the current dose is still appropriate or if a reduction might be beneficial?  I appreciate your thoughts and collaboration on this.

## 2023-12-09 ENCOUNTER — Telehealth: Payer: Self-pay

## 2023-12-09 NOTE — Telephone Encounter (Signed)
 left message with infomation per dr. vickey order. will also try to call pt back to make sure that information was understood

## 2023-12-09 NOTE — Telephone Encounter (Signed)
 pt left a message asking if she is suppose to be taking the abilify . if so she needs a refill. pt was last seen on 7-15 mext appt 8-11

## 2023-12-09 NOTE — Telephone Encounter (Signed)
 Please advise the patient not to take Abilify , which was discontinued during her previous admission. She should continue taking Lamotrigine  and Quetiapine  as prescribed.

## 2023-12-10 ENCOUNTER — Ambulatory Visit: Payer: Self-pay | Admitting: General Surgery

## 2023-12-10 NOTE — H&P (View-Only) (Signed)
 History of Present Illness Stephanie Anthony is an 89 year old female with a history of hemorrhoid surgery who presents with severe hemorrhoidal bleeding and discomfort.  Approximately six months ago, she began experiencing rectal bleeding, which was severe enough at one point that she considered calling emergency services. Three months ago, she noticed a yellowish slick discharge accompanying the blood during bowel movements, which has a malodorous smell. She experiences pain during bowel movements and describes a sensation of her intestines protruding externally, which resolves upon sitting.  The discomfort has led her to wear protective pads due to bleeding and occasional fecal incontinence. She changes these pads frequently due to soreness. She sometimes needs to strain during bowel movements, but has not recently used any aids for this. Her bowel movements are not hard but occur in small amounts daily.  She had a colonoscopy four years ago and has undergone approximately six surgeries in the past, though she is not keen on having more surgeries. She has been prescribed a cream for her hemorrhoids, which she applies externally.  She consumes a lot of frozen dinners, which may contribute to low dietary fiber intake.      PAST MEDICAL HISTORY:  Past Medical History:  Diagnosis Date  . Acquired hypothyroidism   . Bipolar disorder (CMS/HHS-HCC)    type I, followed by Dr. Laurence  . Cataracts, bilateral   . Colon polyp   . Environmental allergies    smoke, dust, dog dander  . Erosive esophagitis 10/20/2014  . Erosive gastritis 10/20/2014  . Esophageal stricture 11/13/2008   Dr. Juengel  . Essential hypertension   . Fibroid tumor   . Mitral valve insufficiency and aortic valve insufficiency 09/27/2014  . Osteoarthritis   . Pure hypercholesterolemia   . Stage 4 chronic kidney disease (Cr 1.9, GFR 25 - 02/02/23) - followed by Dr. Marcelino 09/19/2014        PAST SURGICAL HISTORY:   Past  Surgical History:  Procedure Laterality Date  . THYROIDECTOMY TOTAL  1977  . Right total knee arthroplasty  09/30/2006   Dr. Mardee  . ESOPHAGEAL DILATION  11/13/2008   Juengel  . ENDOSCOPY  01/28/2010   Dr. Deward Piedmont  . EGD  10/20/2014   Erosive esophagitis/Erosive gastritis/Repeat 7 weeks/MUS  . COLONOSCOPY  06/01/2018   Tubular adenoma of the colon/No Repeat/MUS  . EGD  06/01/2018   Gastritis/Duodenitis/No Repeat/MUS  . APPENDECTOMY  06/21/2018   Dr Lucas Catchings  . Left total knee arthroplasty using computer-assisted navigation  06/10/2023   Dr Mardee  . BACK SURGERY     For Infection in the back many years ago  . CHOLECYSTECTOMY    . COLONOSCOPY  01/03/2003, 04/07/2006, 01/28/2010, 01/20/2013   Dr. Deward Piedmont  . Nose surgery     deviated septum repair         MEDICATIONS:  Outpatient Encounter Medications as of 12/10/2023  Medication Sig Dispense Refill  . ARIPiprazole  (ABILIFY ) 2 MG tablet Take 2 mg by mouth once daily    . gabapentin  (NEURONTIN ) 100 MG capsule Take 100 mg by mouth 3 (three) times daily    . lamoTRIgine  (LAMICTAL ) 100 MG tablet Take 1 tablet (100 mg total) by mouth at bedtime 30 tablet 1  . latanoprost  (XALATAN ) 0.005 % ophthalmic solution INSTILL 1 DROP IN BOTH EYES EVERY EVENING    . levothyroxine  (SYNTHROID ) 100 MCG tablet Take 1 tablet (100 mcg total) by mouth every morning before breakfast (0630) 30 TO 60 MINUTES BEFORE BREAKFAST  ON AN EMPTY STOMACH AND WITH A GLASS OF WATER 100 tablet 1  . losartan  (COZAAR ) 25 MG tablet TAKE 1 TABLET BY MOUTH ONCE DAILY 90 tablet 1  . pantoprazole  (PROTONIX ) 40 MG DR tablet Take 1 tablet (40 mg total) by mouth once daily 100 tablet 1  . pravastatin  (PRAVACHOL ) 10 MG tablet Take 10 mg by mouth    . QUEtiapine  (SEROQUEL ) 100 MG tablet Take 100 mg by mouth at bedtime    . TORsemide (DEMADEX) 20 MG tablet Take 20 mg by mouth once daily    . FUROsemide  (LASIX ) 40 MG tablet Take 1 tablet (40 mg total) by mouth once daily  (Patient not taking: Reported on 12/10/2023)    . levothyroxine  (SYNTHROID ) 112 MCG tablet Take 112 mcg by mouth once daily 30 TO 60 MINUTES BEFORE BREAKFAST ON AN EMPTY STOMACH AND WITH A GLASS OF WATER (Patient not taking: Reported on 12/10/2023)    . lovastatin (MEVACOR) 40 MG tablet TAKE 1 TABLET(40 MG) BY MOUTH AT BEDTIME (Patient not taking: Reported on 12/10/2023) 100 tablet 1   No facility-administered encounter medications on file as of 12/10/2023.     ALLERGIES:   Morphine , Prednisone, Aspirin , Codeine, Diazepam, and Olanzapine    SOCIAL HISTORY:  Social History   Socioeconomic History  . Marital status: Widowed  . Number of children: 2  . Years of education: 6  Occupational History  . Occupation: Retired-Mill Work  Tobacco Use  . Smoking status: Former    Current packs/day: 0.00    Average packs/day: 0.8 packs/day for 12.0 years (9.0 ttl pk-yrs)    Types: Cigarettes    Start date: 05/19/1950    Quit date: 05/19/1962    Years since quitting: 61.6    Passive exposure: Past  . Smokeless tobacco: Never  . Tobacco comments:    started age 10 and quit in 1964  Vaping Use  . Vaping status: Never Used  Substance and Sexual Activity  . Alcohol use: No    Alcohol/week: 0.0 standard drinks of alcohol  . Drug use: Never  . Sexual activity: Defer    Partners: Male   Social Drivers of Health   Financial Resource Strain: Low Risk  (12/10/2023)   Overall Financial Resource Strain (CARDIA)   . Difficulty of Paying Living Expenses: Not hard at all  Food Insecurity: No Food Insecurity (12/10/2023)   Hunger Vital Sign   . Worried About Programme researcher, broadcasting/film/video in the Last Year: Never true   . Ran Out of Food in the Last Year: Never true  Transportation Needs: No Transportation Needs (12/10/2023)   PRAPARE - Transportation   . Lack of Transportation (Medical): No   . Lack of Transportation (Non-Medical): No    FAMILY HISTORY:  Family History  Problem Relation Name Age of Onset  .  Stroke Mother    . Myocardial Infarction (Heart attack) Father    . Gout Sister    . Heart disease Sister       GENERAL REVIEW OF SYSTEMS:   General ROS: negative for - chills, fatigue, fever, weight gain or weight loss Allergy and Immunology ROS: negative for - hives  Hematological and Lymphatic ROS: negative for - bleeding problems or bruising, negative for palpable nodes Endocrine ROS: negative for - heat or cold intolerance, hair changes Respiratory ROS: negative for - cough, shortness of breath or wheezing Cardiovascular ROS: no chest pain or palpitations GI ROS: negative for nausea, vomiting, abdominal pain, diarrhea, positive for constipation Musculoskeletal  ROS: negative for - joint swelling or muscle pain Neurological ROS: negative for - confusion, syncope Dermatological ROS: negative for pruritus and rash  PHYSICAL EXAM:  Vitals:   12/10/23 1316  BP: 133/62  Pulse: 64  .  Ht:170.2 cm (5' 7) Wt:87.1 kg (192 lb) ADJ:Anib surface area is 2.03 meters squared. Body mass index is 30.07 kg/m.SABRA   GENERAL: Alert, active, oriented x3  HEENT: Pupils equal reactive to light. Extraocular movements are intact. Sclera clear. Palpebral conjunctiva normal red color.Pharynx clear.  NECK: Supple with no palpable mass and no adenopathy.  LUNGS: Sound clear with no rales rhonchi or wheezes.  HEART: Regular rhythm S1 and S2 without murmur.  RECTAL: Rectal exam shows multiple internal and external hemorrhoids.  Mild bleeding during procedure.  Nontender.  EXTREMITIES: Well-developed well-nourished symmetrical with no dependent edema.  NEUROLOGICAL: Awake alert oriented, facial expression symmetrical, moving all extremities.     Assessment & Plan Chronic internal hemorrhoids   She has chronic internal hemorrhoids with significant bleeding, pain, yellowish discharge, severe bleeding, discomfort, and a sensation of prolapse during bowel movements for the past six months. Previous  topical treatments were ineffective due to the internal location. Constipation worsens symptoms. Surgical intervention is necessary due to the severity and persistence of symptoms. Hemorrhoidectomy is preferred over banding because of positioning difficulties and multiple hemorrhoids. Schedule hemorrhoidectomy under sedation with local anesthesia to remove bleeding and prolapsing hemorrhoids. Advise a high fiber diet to prevent constipation, including options like Metamucil, oatmeal, and high fiber cookies. Prescribe Miralax twice daily post-surgery to prevent constipation. Arrange for someone to stay with her for 24 hours post-surgery due to sedation effects.   Internal and external bleeding hemorrhoids [K64.4, K64.8]          Patient and her son verbalized understanding, all questions were answered, and were agreeable with the plan outlined above.   Lucas Sjogren, MD  Electronically signed by Lucas Sjogren, MD

## 2023-12-18 ENCOUNTER — Other Ambulatory Visit: Payer: Self-pay

## 2023-12-18 ENCOUNTER — Encounter
Admission: RE | Admit: 2023-12-18 | Discharge: 2023-12-18 | Disposition: A | Discharge status: Home / Self Care | Source: Ambulatory Visit | Attending: General Surgery | Admitting: General Surgery

## 2023-12-18 HISTORY — DX: Dyspnea, unspecified: R06.00

## 2023-12-18 HISTORY — DX: Anemia, unspecified: D64.9

## 2023-12-18 NOTE — Patient Instructions (Addendum)
 Your procedure is scheduled on: 12/25/23 - Friday Report to the Registration Desk on the 1st floor of the [facility]. To find out your arrival time, please call [phone] between 1PM - 3PM on: 12/24/23 - Thursday If your arrival time is 6:00 am, do not arrive before that time as the [facility] entrance doors do not open until 6:00 am.  REMEMBER: Instructions that are not followed completely may result in serious medical risk, up to and including death; or upon the discretion of your surgeon and anesthesiologist your surgery may need to be rescheduled.  Do not eat food or drink any liquids after midnight the night before surgery.  No gum chewing or hard candies.  One week prior to surgery: Stop Anti-inflammatories (NSAIDS) such as Advil, Aleve, Ibuprofen, Motrin, Naproxen, Naprosyn and Aspirin  based products such as Excedrin, Goody's Powder, BC Powder. You may take Tylenol  if needed for pain up until the day of surgery.  Stop ANY OVER THE COUNTER supplements until after surgery : MULTIVITAMIN   ON THE DAY OF SURGERY ONLY TAKE THESE MEDICATIONS WITH SIPS OF WATER:  gabapentin  (NEURONTIN )  lamoTRIgine  (LAMICTAL )  levothyroxine  (SYNTHROID )  pantoprazole  (PROTONIX )    No Alcohol for 24 hours before or after surgery.  No Smoking including e-cigarettes for 24 hours before surgery.  No chewable tobacco products for at least 6 hours before surgery.  No nicotine patches on the day of surgery.  Do not use any recreational drugs for at least a week (preferably 2 weeks) before your surgery.  Please be advised that the combination of cocaine and anesthesia may have negative outcomes, up to and including death. If you test positive for cocaine, your surgery will be cancelled.  On the morning of surgery brush your teeth with toothpaste and water, you may rinse your mouth with mouthwash if you wish. Do not swallow any toothpaste or mouthwash.  Do not wear jewelry, make-up, hairpins,  clips or nail polish.  For welded (permanent) jewelry: bracelets, anklets, waist bands, etc.  Please have this removed prior to surgery.  If it is not removed, there is a chance that hospital personnel will need to cut it off on the day of surgery.  Do not wear lotions, powders, or perfumes.   Do not shave body hair from the neck down 48 hours before surgery.  Contact lenses, hearing aids and dentures may not be worn into surgery.  Do not bring valuables to the hospital. [facility] is not responsible for any missing/lost belongings or valuables.   Notify your doctor if there is any change in your medical condition (cold, fever, infection).  Wear comfortable clothing (specific to your surgery type) to the hospital.  After surgery, you can help prevent lung complications by doing breathing exercises.  Take deep breaths and cough every 1-2 hours. Your doctor may order a device called an Incentive Spirometer to help you take deep breaths.  When coughing or sneezing, hold a pillow firmly against your incision with both hands. This is called "splinting." Doing this helps protect your incision. It also decreases belly discomfort.  If you are being admitted to the hospital overnight, leave your suitcase in the car. After surgery it may be brought to your room.  In case of increased patient census, it may be necessary for you, the patient, to continue your postoperative care in the Same Day Surgery department.  If you are being discharged the day of surgery, you will not be allowed to drive home. You  will need a responsible individual to drive you home and stay with you for 24 hours after surgery.   If you are taking public transportation, you will need to have a responsible individual with you.  Please call the Pre-admissions Testing Dept. at [phone] if you have any questions about these instructions.  Surgery Visitation Policy:  Patients having surgery or a procedure may have two  visitors.  Children under the age of 1 must have an adult with them who is not the patient.  Inpatient Visitation:    Visiting hours are 7 a.m. to 8 p.m. Up to four visitors are allowed at one time in a patient room. The visitors may rotate out with other people during the day.  One visitor age 71 or older may stay with the patient overnight and must be in the room by 8 p.m.   Merchandiser, retail to address health-related social needs:  [email or web address]
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
## 2023-12-25 ENCOUNTER — Encounter
Admission: RE | Disposition: A | Discharge status: Home / Self Care | Payer: Self-pay | Source: Home / Self Care | Attending: General Surgery

## 2023-12-25 ENCOUNTER — Encounter: Payer: Self-pay | Admitting: General Surgery

## 2023-12-25 ENCOUNTER — Other Ambulatory Visit: Payer: Self-pay

## 2023-12-25 ENCOUNTER — Ambulatory Visit: Admitting: Certified Registered"

## 2023-12-25 ENCOUNTER — Ambulatory Visit
Admission: RE | Admit: 2023-12-25 | Discharge: 2023-12-25 | Disposition: A | Discharge status: Home / Self Care | Attending: General Surgery | Admitting: General Surgery

## 2023-12-25 DIAGNOSIS — N184 Chronic kidney disease, stage 4 (severe): Secondary | ICD-10-CM | POA: Diagnosis not present

## 2023-12-25 DIAGNOSIS — Z8711 Personal history of peptic ulcer disease: Secondary | ICD-10-CM | POA: Diagnosis not present

## 2023-12-25 DIAGNOSIS — Z87891 Personal history of nicotine dependence: Secondary | ICD-10-CM | POA: Insufficient documentation

## 2023-12-25 DIAGNOSIS — E039 Hypothyroidism, unspecified: Secondary | ICD-10-CM | POA: Insufficient documentation

## 2023-12-25 DIAGNOSIS — Z86718 Personal history of other venous thrombosis and embolism: Secondary | ICD-10-CM | POA: Insufficient documentation

## 2023-12-25 DIAGNOSIS — K219 Gastro-esophageal reflux disease without esophagitis: Secondary | ICD-10-CM | POA: Insufficient documentation

## 2023-12-25 DIAGNOSIS — K642 Third degree hemorrhoids: Secondary | ICD-10-CM | POA: Insufficient documentation

## 2023-12-25 DIAGNOSIS — Z8249 Family history of ischemic heart disease and other diseases of the circulatory system: Secondary | ICD-10-CM | POA: Insufficient documentation

## 2023-12-25 DIAGNOSIS — I129 Hypertensive chronic kidney disease with stage 1 through stage 4 chronic kidney disease, or unspecified chronic kidney disease: Secondary | ICD-10-CM | POA: Insufficient documentation

## 2023-12-25 DIAGNOSIS — M1990 Unspecified osteoarthritis, unspecified site: Secondary | ICD-10-CM | POA: Diagnosis not present

## 2023-12-25 DIAGNOSIS — F319 Bipolar disorder, unspecified: Secondary | ICD-10-CM | POA: Diagnosis not present

## 2023-12-25 DIAGNOSIS — K644 Residual hemorrhoidal skin tags: Secondary | ICD-10-CM | POA: Insufficient documentation

## 2023-12-25 DIAGNOSIS — F419 Anxiety disorder, unspecified: Secondary | ICD-10-CM | POA: Diagnosis not present

## 2023-12-25 DIAGNOSIS — Z79899 Other long term (current) drug therapy: Secondary | ICD-10-CM | POA: Diagnosis not present

## 2023-12-25 DIAGNOSIS — J449 Chronic obstructive pulmonary disease, unspecified: Secondary | ICD-10-CM | POA: Diagnosis not present

## 2023-12-25 DIAGNOSIS — I2510 Atherosclerotic heart disease of native coronary artery without angina pectoris: Secondary | ICD-10-CM | POA: Diagnosis not present

## 2023-12-25 DIAGNOSIS — K449 Diaphragmatic hernia without obstruction or gangrene: Secondary | ICD-10-CM | POA: Diagnosis not present

## 2023-12-25 HISTORY — PX: HEMORRHOID SURGERY: SHX153

## 2023-12-25 SURGERY — HEMORRHOIDECTOMY
Anesthesia: General | Site: Rectum

## 2023-12-25 MED ORDER — FENTANYL CITRATE (PF) 100 MCG/2ML IJ SOLN
INTRAMUSCULAR | Status: DC | PRN
  Administered 2023-12-25: 50 ug via INTRAVENOUS

## 2023-12-25 MED ORDER — LIDOCAINE HCL (CARDIAC) PF 100 MG/5ML IV SOSY
PREFILLED_SYRINGE | INTRAVENOUS | Status: DC | PRN
Start: 2023-12-25 — End: 2023-12-25
  Administered 2023-12-25: 100 mg via INTRAVENOUS

## 2023-12-25 MED ORDER — PROPOFOL 10 MG/ML IV BOLUS
INTRAVENOUS | Status: DC | PRN
  Administered 2023-12-25: 100 mg via INTRAVENOUS

## 2023-12-25 MED ORDER — CHLORHEXIDINE GLUCONATE 0.12 % MT SOLN
OROMUCOSAL | Status: AC
  Filled 2023-12-25: qty 15

## 2023-12-25 MED ORDER — ORAL CARE MOUTH RINSE: 15.0000 mL | Freq: Once | OROMUCOSAL | Status: AC

## 2023-12-25 MED ORDER — METRONIDAZOLE 500 MG/100ML IV SOLN
500.0000 mg | INTRAVENOUS | Status: AC
  Administered 2023-12-25: 500 mg via INTRAVENOUS
  Filled 2023-12-25 (×2): qty 100

## 2023-12-25 MED ORDER — GLYCOPYRROLATE 0.2 MG/ML IJ SOLN
INTRAMUSCULAR | Status: DC | PRN
  Administered 2023-12-25: .2 mg via INTRAVENOUS

## 2023-12-25 MED ORDER — GELATIN ABSORBABLE 12-7 MM EX MISC
CUTANEOUS | Status: AC
  Filled 2023-12-25: qty 1

## 2023-12-25 MED ORDER — BUPIVACAINE LIPOSOME 1.3 % IJ SUSP
INTRAMUSCULAR | Status: DC | PRN
Start: 2023-12-25 — End: 2023-12-25
  Administered 2023-12-25: 20 mL

## 2023-12-25 MED ORDER — FENTANYL CITRATE (PF) 100 MCG/2ML IJ SOLN: 25.0000 ug | INTRAMUSCULAR | Status: DC | PRN

## 2023-12-25 MED ORDER — ACETAMINOPHEN 10 MG/ML IV SOLN
INTRAVENOUS | Status: DC | PRN
  Administered 2023-12-25: 1000 mg via INTRAVENOUS

## 2023-12-25 MED ORDER — ACETAMINOPHEN 10 MG/ML IV SOLN
INTRAVENOUS | Status: AC
  Filled 2023-12-25: qty 100

## 2023-12-25 MED ORDER — FENTANYL CITRATE (PF) 100 MCG/2ML IJ SOLN
INTRAMUSCULAR | Status: AC
  Filled 2023-12-25: qty 2

## 2023-12-25 MED ORDER — BUPIVACAINE-EPINEPHRINE (PF) 0.5% -1:200000 IJ SOLN
INTRAMUSCULAR | Status: DC | PRN
  Administered 2023-12-25: 30 mL

## 2023-12-25 MED ORDER — CIPROFLOXACIN IN D5W 400 MG/200ML IV SOLN
400.0000 mg | INTRAVENOUS | Status: AC
  Administered 2023-12-25: 400 mg via INTRAVENOUS

## 2023-12-25 MED ORDER — LACTATED RINGERS IV SOLN: INTRAVENOUS | Status: DC

## 2023-12-25 MED ORDER — ONDANSETRON HCL 4 MG/2ML IJ SOLN
INTRAMUSCULAR | Status: DC | PRN
  Administered 2023-12-25 (×2): 4 mg via INTRAVENOUS

## 2023-12-25 MED ORDER — CHLORHEXIDINE GLUCONATE 0.12 % MT SOLN
15.0000 mL | Freq: Once | OROMUCOSAL | Status: AC
  Administered 2023-12-25: 15 mL via OROMUCOSAL

## 2023-12-25 MED ORDER — ONDANSETRON HCL 4 MG/2ML IJ SOLN: 4.0000 mg | Freq: Once | INTRAMUSCULAR | Status: DC | PRN

## 2023-12-25 MED ORDER — SUGAMMADEX SODIUM 200 MG/2ML IV SOLN
INTRAVENOUS | Status: DC | PRN
  Administered 2023-12-25: 200 mg via INTRAVENOUS

## 2023-12-25 MED ORDER — EPHEDRINE SULFATE-NACL 50-0.9 MG/10ML-% IV SOSY
PREFILLED_SYRINGE | INTRAVENOUS | Status: DC | PRN
Start: 2023-12-25 — End: 2023-12-25
  Administered 2023-12-25 (×2): 5 mg via INTRAVENOUS

## 2023-12-25 MED ORDER — OXYCODONE HCL 5 MG PO TABS: 5.0000 mg | ORAL_TABLET | Freq: Three times a day (TID) | ORAL | 0 refills | Status: DC | PRN

## 2023-12-25 MED ORDER — OXYCODONE HCL 5 MG/5ML PO SOLN: 5.0000 mg | Freq: Once | ORAL | Status: DC | PRN

## 2023-12-25 MED ORDER — BUPIVACAINE-EPINEPHRINE (PF) 0.5% -1:200000 IJ SOLN
INTRAMUSCULAR | Status: AC
  Filled 2023-12-25: qty 10

## 2023-12-25 MED ORDER — PHENYLEPHRINE 80 MCG/ML (10ML) SYRINGE FOR IV PUSH (FOR BLOOD PRESSURE SUPPORT)
PREFILLED_SYRINGE | INTRAVENOUS | Status: DC | PRN
  Administered 2023-12-25 (×3): 80 ug via INTRAVENOUS

## 2023-12-25 MED ORDER — DEXAMETHASONE SODIUM PHOSPHATE 10 MG/ML IJ SOLN: INTRAMUSCULAR | Status: DC | PRN

## 2023-12-25 MED ORDER — ROCURONIUM BROMIDE 100 MG/10ML IV SOLN
INTRAVENOUS | Status: DC | PRN
  Administered 2023-12-25: 50 mg via INTRAVENOUS

## 2023-12-25 MED ORDER — OXYCODONE HCL 5 MG PO TABS: 5.0000 mg | ORAL_TABLET | Freq: Once | ORAL | Status: DC | PRN

## 2023-12-25 MED ORDER — CIPROFLOXACIN IN D5W 400 MG/200ML IV SOLN
INTRAVENOUS | Status: AC
  Filled 2023-12-25: qty 200

## 2023-12-25 SURGICAL SUPPLY — 30 items
BRIEF MESH DISP 2XL (UNDERPADS AND DIAPERS) ×1 IMPLANT
DISSECTOR SURG LIGASURE 21 (MISCELLANEOUS) IMPLANT
DRAPE LAPAROTOMY 100X77 ABD (DRAPES) ×1 IMPLANT
DRAPE LEGGINS SURG 28X43 STRL (DRAPES) ×1 IMPLANT
DRAPE UNDER BUTTOCK W/FLU (DRAPES) ×1 IMPLANT
ELECTRODE REM PT RTRN 9FT ADLT (ELECTROSURGICAL) ×1 IMPLANT
GAUZE 4X4 16PLY ~~LOC~~+RFID DBL (SPONGE) IMPLANT
GAUZE SPONGE 4X4 12PLY STRL (GAUZE/BANDAGES/DRESSINGS) ×1 IMPLANT
GLOVE BIO SURGEON STRL SZ 6.5 (GLOVE) ×1 IMPLANT
GLOVE BIOGEL PI IND STRL 6.5 (GLOVE) ×1 IMPLANT
GLOVE SURG SYN 6.5 PF PI (GLOVE) ×2 IMPLANT
GOWN STRL REUS W/ TWL LRG LVL3 (GOWN DISPOSABLE) ×3 IMPLANT
KIT TURNOVER CYSTO (KITS) ×1 IMPLANT
LABEL OR SOLS (LABEL) ×1 IMPLANT
MANIFOLD NEPTUNE II (INSTRUMENTS) ×1 IMPLANT
NDL HYPO 22X1.5 SAFETY MO (MISCELLANEOUS) ×1 IMPLANT
NEEDLE HYPO 22X1.5 SAFETY MO (MISCELLANEOUS) ×1 IMPLANT
NS IRRIG 500ML POUR BTL (IV SOLUTION) ×1 IMPLANT
PACK BASIN MINOR ARMC (MISCELLANEOUS) ×1 IMPLANT
PAD ABD DERMACEA PRESS 5X9 (GAUZE/BANDAGES/DRESSINGS) ×1 IMPLANT
PAD PREP OB/GYN DISP 24X41 (PERSONAL CARE ITEMS) ×1 IMPLANT
SOLUTION PREP PVP 2OZ (MISCELLANEOUS) ×1 IMPLANT
SURGILUBE 2OZ TUBE FLIPTOP (MISCELLANEOUS) ×1 IMPLANT
SUT VIC AB 2-0 SH 27XBRD (SUTURE) ×1 IMPLANT
SUT VIC AB 3-0 SH 27X BRD (SUTURE) ×1 IMPLANT
SUTURE EHLN 3-0 FS-10 30 BLK (SUTURE) IMPLANT
SYR 10ML LL (SYRINGE) ×1 IMPLANT
SYR BULB IRRIG 60ML STRL (SYRINGE) ×1 IMPLANT
TRAP FLUID SMOKE EVACUATOR (MISCELLANEOUS) ×1 IMPLANT
WATER STERILE IRR 500ML POUR (IV SOLUTION) ×1 IMPLANT

## 2023-12-25 NOTE — Anesthesia Preprocedure Evaluation (Signed)
 Anesthesia Evaluation  Patient identified by MRN, date of birth, ID band Patient awake    Reviewed: Allergy & Precautions, NPO status , Patient's Chart, lab work & pertinent test results  Airway Mallampati: III  TM Distance: >3 FB Neck ROM: full    Dental  (+) Caps,    Pulmonary neg pulmonary ROS, COPD, Patient abstained from smoking., former smoker   Pulmonary exam normal breath sounds clear to auscultation       Cardiovascular Exercise Tolerance: Good hypertension, Pt. on medications + CAD and + DVT  negative cardio ROS Normal cardiovascular exam Rhythm:Regular Rate:Normal     Neuro/Psych   Anxiety  Bipolar Disorder   negative neurological ROS  negative psych ROS   GI/Hepatic negative GI ROS, Neg liver ROS, hiatal hernia, PUD,GERD  Medicated,,  Endo/Other  negative endocrine ROSHypothyroidism    Renal/GU Renal InsufficiencyRenal disease     Musculoskeletal  (+) Arthritis ,    Abdominal Normal abdominal exam  (+)   Peds negative pediatric ROS (+)  Hematology negative hematology ROS (+) Blood dyscrasia, anemia   Anesthesia Other Findings Past Medical History: No date: Anemia No date: Anxiety No date: Arthritis No date: Bipolar 1 disorder (HCC) No date: Cataracts, bilateral No date: Chronic kidney disease     Comment:  stage 3 No date: Chronic kidney disease No date: Chronic mitral valve disease No date: COPD (chronic obstructive pulmonary disease) (HCC) No date: Coronary artery disease No date: Deviated nasal septum No date: Dyspnea No date: Erosive esophagitis No date: Erosive esophagitis No date: Esophageal stricture No date: Fibroid tumor No date: GERD (gastroesophageal reflux disease) No date: History of hiatal hernia No date: Hypertension No date: Hypothyroidism No date: Murmur, cardiac No date: Nephrogenic diabetes insipidus (HCC) No date: Pure hypercholesterolemia No date: Renal cyst,  left     Comment:  bilateral No date: Seasonal allergies  Past Surgical History: No date: APPENDECTOMY No date: CARDIAC CATHETERIZATION No date: CHOLECYSTECTOMY 06/01/2018: COLONOSCOPY WITH PROPOFOL ; N/A     Comment:  Procedure: COLONOSCOPY WITH PROPOFOL ;  Surgeon:               [name], MD;  Location: [facility] ENDOSCOPY;                Service: Endoscopy;  Laterality: N/A; No date: ESOPHAGEAL DILATION 10/20/2014: ESOPHAGOGASTRODUODENOSCOPY (EGD) WITH PROPOFOL ; N/A     Comment:  Procedure: ESOPHAGOGASTRODUODENOSCOPY (EGD) WITH               PROPOFOL ;  Surgeon: Gladis PENNER Gaylyn, MD;  Location[facility]               [facility] ENDOSCOPY;  Service: Endoscopy;  Laterality: N/A; 06/01/2018: ESOPHAGOGASTRODUODENOSCOPY (EGD) WITH PROPOFOL ; N/A     Comment:  Procedure: ESOPHAGOGASTRODUODENOSCOPY (EGD) WITH               PROPOFOL ;  Surgeon: [name], MD;  Location:               [facility] ENDOSCOPY;  Service: Endoscopy;  Laterality: N/A; No date: INFECTED SKIN DEBRIDEMENT     Comment:  on back several years ago No date: JOINT REPLACEMENT 06/10/2023: KNEE ARTHROPLASTY; Left     Comment:  Procedure: COMPUTER ASSISTED TOTAL KNEE ARTHROPLASTY;                Surgeon: Mardee Lynwood SQUIBB, MD;  Location: [facility] ORS;                Service: Orthopedics;  Laterality: Left;  No date: knee replacement, right 06/21/2018: LAPAROSCOPIC APPENDECTOMY; N/A     Comment:  Procedure: APPENDECTOMY LAPAROSCOPIC;  Surgeon:               [name], MD;  Location: [facility] ORS;  Service:              General;  Laterality: N/A; No date: NOSE SURGERY No date: THYROIDECTOMY No date: TOTAL THYROIDECTOMY  BMI    Body Mass Index: 30.42 kg/m      Reproductive/Obstetrics negative OB ROS                              Anesthesia Physical Anesthesia Plan  ASA: 3  Anesthesia Plan: General   Post-op Pain Management:    Induction: Intravenous  PONV Risk Score and Plan: Dexamethasone ,  Ondansetron , Midazolam and Treatment may vary due to age or medical condition  Airway Management Planned: LMA  Additional Equipment:   Intra-op Plan:   Post-operative Plan: Extubation in OR  Informed Consent: I have reviewed the patients History and Physical, chart, labs and discussed the procedure including the risks, benefits and alternatives for the proposed anesthesia with the patient or authorized representative who has indicated his/her understanding and acceptance.     Dental Advisory Given  Plan Discussed with: CRNA  Anesthesia Plan Comments:          Anesthesia Quick Evaluation

## 2023-12-25 NOTE — Anesthesia Postprocedure Evaluation (Signed)
 Anesthesia Post Note  Patient: Stephanie Anthony  Procedure(s) Performed: HEMORRHOIDECTOMY (Rectum)  Patient location during evaluation: PACU Anesthesia Type: General Level of consciousness: awake and awake and alert Pain management: satisfactory to patient Vital Signs Assessment: post-procedure vital signs reviewed and stable Respiratory status: spontaneous breathing Cardiovascular status: stable Anesthetic complications: no   No notable events documented.   Last Vitals:  Vitals:   12/25/23 1015 12/25/23 1035  BP: (!) 102/50 (!) 114/49  Pulse: 75 70  Resp: 17 16  Temp: (!) 36.2 C (!) 36.1 C  SpO2: 98% 100%    Last Pain:  Vitals:   12/25/23 1035  TempSrc: Temporal  PainSc: 0-No pain                 VAN STAVEREN,Janyah Singleterry

## 2023-12-25 NOTE — Transfer of Care (Signed)
 Immediate Anesthesia Transfer of Care Note  Patient: Stephanie Anthony  Procedure(s) Performed: HEMORRHOIDECTOMY (Rectum)  Patient Location: PACU  Anesthesia Type:General  Level of Consciousness: drowsy and patient cooperative  Airway & Oxygen Therapy: Patient Spontanous Breathing and Patient connected to face mask oxygen  Post-op Assessment: Report given to RN and Post -op Vital signs reviewed and stable  Post vital signs: Reviewed and stable  Last Vitals:  Vitals Value Taken Time  BP 111/49 12/25/23 09:45  Temp 36.5 C 12/25/23 09:44  Pulse 85 12/25/23 09:47  Resp 27 12/25/23 09:47  SpO2 100 % 12/25/23 09:47  Vitals shown include unfiled device data.  Last Pain:  Vitals:   12/25/23 0752  TempSrc: Temporal  PainSc: 5          Complications: No notable events documented.

## 2023-12-25 NOTE — Discharge Instructions (Signed)
  Diet: Resume home heart healthy regular diet.   Activity: No heavy lifting >20 pounds (children, pets, laundry, garbage) or strenuous activity until follow-up, but light activity and walking are encouraged. Do not drive or drink alcohol if taking narcotic pain medications.  Wound care: May shower with soapy water and pat dry (do not rub incisions), but no baths or submerging incision underwater until follow-up. (no swimming).   Medications: Resume all home medications. For mild to moderate pain: acetaminophen  (Tylenol ) or ibuprofen (if no kidney disease). Combining Tylenol  with alcohol can substantially increase your risk of causing liver disease. Narcotic pain medications, if prescribed, can be used for severe pain, though may cause nausea, constipation, and drowsiness. If you do not need the narcotic pain medication, you do not need to fill the prescription.  Use Miralax twice per day  Add fiber supplements such as Metamucil and prune juice daily.   Call office [phone]) at any time if any questions, worsening pain, fevers/chills, bleeding, drainage from incision site, or other concerns.

## 2023-12-25 NOTE — Interval H&P Note (Signed)
 History and Physical Interval Note:  12/25/2023 8:42 AM  Stephanie Anthony  has presented today for surgery, with the diagnosis of K64.4 K64.8 internal and external bleeding hemorrhoids.  The various methods of treatment have been discussed with the patient and family. After consideration of risks, benefits and other options for treatment, the patient has consented to  Procedure(s): HEMORRHOIDECTOMY (N/A) as a surgical intervention.  The patient's history has been reviewed, patient examined, no change in status, stable for surgery.  I have reviewed the patient's chart and labs.  Questions were answered to the patient's satisfaction.     Lucas Sjogren

## 2023-12-25 NOTE — Op Note (Signed)
 Preoperative diagnosis: Third degree hemorrhoids with bleeding.   Postoperative diagnosis: Third degree hemorrhoids with bleeding.  Procedure: Anoscopy, hemorrhoidectomy.  Surgeon: Dr. Rodolph  Anesthesia: Spinal  Wound classification: Clean Contaminated  Indications: Patient is a 89 y.o. female was found to have symptomatic hemorrhoids refractory to medical managemen.   Findings: 1. Third degree hemorrhoids 2. Internal and external anal sphincter identified and preserved 3. Adequate hemostasis  Description of procedure: The patient was brought to the operating room and spinal anesthesia was induced. Patient was placed in the prone jackknife position. A time-out was completed verifying correct patient, procedure, site, positioning, and implant(s) and/or special equipment prior to beginning this procedure. The buttocks were taped apart.  The perineum was prepped and draped in standard sterile fashion. Local anesthetic was injected as a perianal block. An anoscope was introduced and the three hemorrhoidal pedicles were identified. A Kelly clamp was placed near the base of each pedicle near the dentate line and retracted externally to exteriorize the hemorrhoidal pedicles.  Each pedicle was excised in turn in the following fashion. An elliptical incision was made extending from perianal skin to anorectal ring including both internal and external hemorrhoids and excising a minimum amount of anoderm. Flaps were developed on both aspects of the incision, taking care to elevate only skin and mucosa. The dilated venous mass was dissected using LigaSure device from the underlying sphincter muscle. The base was ligated with a 2-0 Vicryl figure of 8 suture. The pedicle was amputated from the base. Hemostasis was achieved using electrocautery. Following hemostasis, the skin and mucosal incisions were closed with a running lock stitch of 2-0 Vicryl on the mucosal aspect and converted to subcuticular  once skin was encountered. The anal canal was then injected with local anesthetic. A gauze pad was tucked between the gluteal folds.  The patient tolerated the procedure well and was taken to the postanesthesia care unit in stable condition.   Specimen: hemorrhoids  Complications: none  EBL: 10 mL

## 2023-12-25 NOTE — Anesthesia Procedure Notes (Signed)
 Procedure Name: Intubation Date/Time: 12/25/2023 8:46 AM  Performed by: Ledora Duncan, CRNAPre-anesthesia Checklist: Patient identified, Emergency Drugs available, Suction available and Patient being monitored Patient Re-evaluated:Patient Re-evaluated prior to induction Oxygen Delivery Method: Circle system utilized Preoxygenation: Pre-oxygenation with 100% oxygen Induction Type: IV induction Ventilation: Mask ventilation without difficulty Laryngoscope Size: McGrath and 3 Grade View: Grade I Tube type: Oral Number of attempts: 1 Airway Equipment and Method: Stylet Placement Confirmation: ETT inserted through vocal cords under direct vision, positive ETCO2 and breath sounds checked- equal and bilateral Secured at: 21 cm Tube secured with: Tape Dental Injury: Teeth and Oropharynx as per pre-operative assessment

## 2023-12-26 ENCOUNTER — Encounter: Payer: Self-pay | Admitting: General Surgery

## 2023-12-28 ENCOUNTER — Ambulatory Visit (INDEPENDENT_AMBULATORY_CARE_PROVIDER_SITE_OTHER): Admitting: Psychiatry

## 2023-12-28 ENCOUNTER — Other Ambulatory Visit: Payer: Self-pay

## 2023-12-28 ENCOUNTER — Encounter: Payer: Self-pay | Admitting: Psychiatry

## 2023-12-28 VITALS — BP 125/66 | HR 69 | Temp 98.4°F | Ht 67.0 in | Wt 212.0 lb

## 2023-12-28 DIAGNOSIS — G4700 Insomnia, unspecified: Secondary | ICD-10-CM

## 2023-12-28 DIAGNOSIS — F319 Bipolar disorder, unspecified: Secondary | ICD-10-CM | POA: Diagnosis not present

## 2023-12-28 LAB — SURGICAL PATHOLOGY

## 2023-12-28 MED ORDER — QUETIAPINE FUMARATE 100 MG PO TABS
100.0000 mg | ORAL_TABLET | Freq: Every day | ORAL | 1 refills | Status: DC
Start: 2023-12-28 — End: 2024-02-04

## 2023-12-28 MED ORDER — LAMOTRIGINE 100 MG PO TABS: 100.0000 mg | ORAL_TABLET | Freq: Every day | ORAL | 3 refills | Status: AC

## 2023-12-28 MED ORDER — QUETIAPINE FUMARATE 25 MG PO TABS: 25.0000 mg | ORAL_TABLET | Freq: Every day | ORAL | 1 refills | Status: DC

## 2023-12-28 NOTE — Patient Instructions (Signed)
 Continue lamotrigine  100 mg daily  Continue quetiapine  125 mg at night  Next appointment: 9/22 at 11 am

## 2023-12-30 ENCOUNTER — Encounter: Payer: Self-pay | Admitting: General Surgery

## 2024-01-17 ENCOUNTER — Emergency Department

## 2024-01-17 ENCOUNTER — Emergency Department
Admission: EM | Admit: 2024-01-17 | Discharge: 2024-01-17 | Disposition: A | Discharge status: Home / Self Care | Attending: Emergency Medicine | Admitting: Emergency Medicine

## 2024-01-17 ENCOUNTER — Other Ambulatory Visit: Payer: Self-pay

## 2024-01-17 DIAGNOSIS — R0602 Shortness of breath: Secondary | ICD-10-CM | POA: Insufficient documentation

## 2024-01-17 DIAGNOSIS — N3000 Acute cystitis without hematuria: Secondary | ICD-10-CM | POA: Insufficient documentation

## 2024-01-17 DIAGNOSIS — J449 Chronic obstructive pulmonary disease, unspecified: Secondary | ICD-10-CM | POA: Diagnosis not present

## 2024-01-17 DIAGNOSIS — R630 Anorexia: Secondary | ICD-10-CM | POA: Insufficient documentation

## 2024-01-17 DIAGNOSIS — I129 Hypertensive chronic kidney disease with stage 1 through stage 4 chronic kidney disease, or unspecified chronic kidney disease: Secondary | ICD-10-CM | POA: Diagnosis not present

## 2024-01-17 DIAGNOSIS — R531 Weakness: Secondary | ICD-10-CM | POA: Insufficient documentation

## 2024-01-17 DIAGNOSIS — N189 Chronic kidney disease, unspecified: Secondary | ICD-10-CM | POA: Diagnosis not present

## 2024-01-17 LAB — COMPREHENSIVE METABOLIC PANEL WITH GFR
ALT: 10 U/L (ref 0–44)
AST: 22 U/L (ref 15–41)
Albumin: 3.6 g/dL (ref 3.5–5.0)
Alkaline Phosphatase: 72 U/L (ref 38–126)
Anion gap: 12 (ref 5–15)
BUN: 31 mg/dL — ABNORMAL HIGH (ref 8–23)
CO2: 23 mmol/L (ref 22–32)
Calcium: 9.6 mg/dL (ref 8.9–10.3)
Chloride: 107 mmol/L (ref 98–111)
Creatinine, Ser: 2.08 mg/dL — ABNORMAL HIGH (ref 0.44–1.00)
GFR, Estimated: 23 mL/min — ABNORMAL LOW (ref >60)
Glucose, Bld: 137 mg/dL — ABNORMAL HIGH (ref 70–99)
Potassium: 4.6 mmol/L (ref 3.5–5.1)
Sodium: 142 mmol/L (ref 135–145)
Total Bilirubin: 0.6 mg/dL (ref 0.0–1.2)
Total Protein: 7.6 g/dL (ref 6.5–8.1)

## 2024-01-17 LAB — RESP PANEL BY RT-PCR (RSV, FLU A&B, COVID)  RVPGX2
Influenza A by PCR: NEGATIVE
Influenza B by PCR: NEGATIVE
Resp Syncytial Virus by PCR: NEGATIVE
SARS Coronavirus 2 by RT PCR: NEGATIVE

## 2024-01-17 LAB — CBC
HCT: 32.8 % — ABNORMAL LOW (ref 36.0–46.0)
Hemoglobin: 10.5 g/dL — ABNORMAL LOW (ref 12.0–15.0)
MCH: 22.9 pg — ABNORMAL LOW (ref 26.0–34.0)
MCHC: 32 g/dL (ref 30.0–36.0)
MCV: 71.5 fL — ABNORMAL LOW (ref 80.0–100.0)
Platelets: 305 K/uL (ref 150–400)
RBC: 4.59 MIL/uL (ref 3.87–5.11)
RDW: 15 % (ref 11.5–15.5)
WBC: 7.5 K/uL (ref 4.0–10.5)
nRBC: 0 % (ref 0.0–0.2)

## 2024-01-17 LAB — URINALYSIS, ROUTINE W REFLEX MICROSCOPIC
Bacteria, UA: NONE SEEN
Bilirubin Urine: NEGATIVE
Glucose, UA: NEGATIVE mg/dL
Ketones, ur: NEGATIVE mg/dL
Nitrite: NEGATIVE
Protein, ur: NEGATIVE mg/dL
Specific Gravity, Urine: 1.004 — ABNORMAL LOW (ref 1.005–1.030)
pH: 7 (ref 5.0–8.0)

## 2024-01-17 LAB — TROPONIN I (HIGH SENSITIVITY): Troponin I (High Sensitivity): 12 ng/L (ref <18)

## 2024-01-17 MED ORDER — CEFPODOXIME PROXETIL 200 MG PO TABS: 200.0000 mg | ORAL_TABLET | Freq: Every day | ORAL | 0 refills | Status: AC

## 2024-01-17 MED ORDER — SODIUM CHLORIDE 0.9 % IV SOLN
1.0000 g | Freq: Once | INTRAVENOUS | Status: AC
  Administered 2024-01-17: 1 g via INTRAVENOUS
  Filled 2024-01-17: qty 10

## 2024-01-17 NOTE — ED Notes (Signed)
 Pt taken to XR.

## 2024-01-17 NOTE — ED Notes (Signed)
 Pt up to walk around, O2 staying at 95-96% RA. Pt stating still having some CP under left breast and a little short of breath but is ready to go home.

## 2024-01-17 NOTE — ED Triage Notes (Signed)
 Pt BIB [facility] from Home for weakness x2 weeks that got worse today and SOB for last 2 days and has low grade fever. EMS reports vitals WNL 124/58, temp 99.1. Pt also complaining of some sternal CP 4/10.

## 2024-01-17 NOTE — ED Provider Notes (Signed)
 [facility] Provider Note    Event Date/Time   First MD Initiated Contact with Patient 01/17/24 1553     (approximate)   History   Chief Complaint Weakness and Shortness of Breath   HPI  Stephanie Anthony is a 89 y.o. female with past medical history of hypertension, hyperlipidemia, CKD, COPD, and bipolar disorder who presents to the ED complaining of weakness and shortness of breath.  Patient reports that she has been feeling increasingly weak in general over the past 2 weeks.  She now states that over the past 2 to 3 days she has had a productive cough with increasing difficulty breathing.  She denies any fevers, chest pain, nausea, vomiting, or diarrhea but does endorse some decreased appetite.  She has not had any dysuria, flank pain, or abdominal pain.  She is not aware of any sick contacts, currently lives by herself.      Physical Exam   Triage Vital Signs: ED Triage Vitals  Encounter Vitals Group     BP --      Girls Systolic BP Percentile --      Girls Diastolic BP Percentile --      Boys Systolic BP Percentile --      Boys Diastolic BP Percentile --      Pulse --      Resp --      Temp --      Temp src --      SpO2 --      Weight 01/17/24 1551 202 lb (91.6 kg)     Height 01/17/24 1551 5' 7 (1.702 m)     Head Circumference --      Peak Flow --      Pain Score 01/17/24 1550 4     Pain Loc --      Pain Education --      Exclude from Growth Chart --     Most recent vital signs: Vitals:   01/17/24 1800 01/17/24 1830  BP: (!) 122/50 (!) 128/50  Pulse: (!) 59 62  Resp:    Temp:    SpO2: 99% 99%    Constitutional: Alert and oriented. Eyes: Conjunctivae are normal. Head: Atraumatic. Nose: No congestion/rhinnorhea. Mouth/Throat: Mucous membranes are moist.  Cardiovascular: Normal rate, regular rhythm. Grossly normal heart sounds.  2+ radial pulses bilaterally. Respiratory: Normal respiratory effort.  No retractions. Lungs  CTAB. Gastrointestinal: Soft and nontender. No distention. Musculoskeletal: Wraps in place to lower extremities with no tenderness, cap refill less than 2 seconds throughout toes of both feet. Neurologic:  Normal speech and language. No gross focal neurologic deficits are appreciated.    ED Results / Procedures / Treatments   Labs (all labs ordered are listed, but only abnormal results are displayed) Labs Reviewed  COMPREHENSIVE METABOLIC PANEL WITH GFR - Abnormal; Notable for the following components:      Result Value   Glucose, Bld 137 (*)    BUN 31 (*)    Creatinine, Ser 2.08 (*)    GFR, Estimated 23 (*)    All other components within normal limits  CBC - Abnormal; Notable for the following components:   Hemoglobin 10.5 (*)    HCT 32.8 (*)    MCV 71.5 (*)    MCH 22.9 (*)    All other components within normal limits  URINALYSIS, ROUTINE W REFLEX MICROSCOPIC - Abnormal; Notable for the following components:   Color, Urine STRAW (*)    APPearance CLEAR (*)  Specific Gravity, Urine 1.004 (*)    Hgb urine dipstick MODERATE (*)    Leukocytes,Ua SMALL (*)    All other components within normal limits  RESP PANEL BY RT-PCR (RSV, FLU A&B, COVID)  [id number]  URINE CULTURE  TROPONIN I (HIGH SENSITIVITY)     EKG  ED ECG REPORT I, Carlin Palin, the attending physician, personally viewed and interpreted this ECG.   Date: 01/17/2024  EKG Time: 15:52  Rate: 68  Rhythm: normal sinus rhythm  Axis: Normal  Intervals:none  ST&T Change: None  RADIOLOGY Chest x-ray reviewed and interpreted by me with no infiltrate, Dema, or effusion.  PROCEDURES:  Critical Care performed: No  Procedures   MEDICATIONS ORDERED IN ED: Medications  cefTRIAXone  (ROCEPHIN ) 1 g in sodium chloride  0.9 % 100 mL IVPB (0 g Intravenous Stopped 01/17/24 1850)     IMPRESSION / MDM / ASSESSMENT AND PLAN / ED COURSE  I reviewed the triage vital signs and the nursing notes.                               89 y.o. female with past medical history of hypertension, hyperlipidemia, CKD, COPD, and bipolar disorder who presents to the ED with increasing generalized weakness for the past 2 weeks with active cough and some difficulty breathing over the past couple of days.  Patient's presentation is most consistent with acute presentation with potential threat to life or bodily function.  Differential diagnosis includes, but is not limited to, ACS, PE, pneumonia, bronchitis, viral syndrome, COVID-19, influenza, anemia, electrolyte abnormality, AKI.  Patient nontoxic-appearing and in no acute distress, vital signs are unremarkable.  She is not in any respiratory distress and maintaining oxygen saturations at 100% on room air.  EKG shows no evidence of arrhythmia or ischemia, labs and chest x-ray are pending, will also check viral panel.  Chest x-ray is unremarkable and labs are reassuring with no significant anemia, leukocytosis, or electrolyte abnormality.  Renal function similar to previous and troponin within normal limits, doubt ACS or PE.  Urinalysis does show possible UTI which could be contributing to her generalized weakness.  Urine was sent for culture and patient given dose of IV Rocephin .  She was offered admission to the hospital but declines, which is reasonable given she was able to ambulate here in the ED without difficulty.  We will prescribe cefpodoxime  for complicated UTI, should also cover for cellulitis diagnosed by her PCP a couple of days ago.  She was counseled to return to the ED for new or worsening symptoms, patient agrees with plan.      FINAL CLINICAL IMPRESSION(S) / ED DIAGNOSES   Final diagnoses:  Generalized weakness  Acute cystitis without hematuria     Rx / DC Orders   ED Discharge Orders          Ordered    cefpodoxime  (VANTIN ) 200 MG tablet  Daily        01/17/24 1918             Note:  This document was prepared using Dragon voice recognition  software and may include unintentional dictation errors.   Palin Carlin, MD 01/17/24 [phone]

## 2024-01-17 NOTE — Discharge Instructions (Signed)
 You should stop taking Keflex  and take cefpodoxime  instead for any infection to your legs as well as urinary tract infection.  You should follow-up with your primary care doctor and return to the ER for any worsening symptoms.

## 2024-01-30 ENCOUNTER — Observation Stay
Admission: EM | Admit: 2024-01-30 | Discharge: 2024-02-04 | Disposition: A | Discharge status: Still In Hospital | Attending: Internal Medicine | Admitting: Internal Medicine

## 2024-01-30 ENCOUNTER — Other Ambulatory Visit: Payer: Self-pay

## 2024-01-30 DIAGNOSIS — N390 Urinary tract infection, site not specified: Secondary | ICD-10-CM | POA: Insufficient documentation

## 2024-01-30 DIAGNOSIS — N3000 Acute cystitis without hematuria: Principal | ICD-10-CM

## 2024-01-30 DIAGNOSIS — F319 Bipolar disorder, unspecified: Secondary | ICD-10-CM | POA: Insufficient documentation

## 2024-01-30 DIAGNOSIS — R600 Localized edema: Secondary | ICD-10-CM | POA: Diagnosis not present

## 2024-01-30 DIAGNOSIS — N184 Chronic kidney disease, stage 4 (severe): Secondary | ICD-10-CM | POA: Diagnosis not present

## 2024-01-30 DIAGNOSIS — E039 Hypothyroidism, unspecified: Secondary | ICD-10-CM | POA: Insufficient documentation

## 2024-01-30 DIAGNOSIS — N179 Acute kidney failure, unspecified: Principal | ICD-10-CM | POA: Insufficient documentation

## 2024-01-30 DIAGNOSIS — K219 Gastro-esophageal reflux disease without esophagitis: Secondary | ICD-10-CM | POA: Diagnosis not present

## 2024-01-30 DIAGNOSIS — D519 Vitamin B12 deficiency anemia, unspecified: Secondary | ICD-10-CM | POA: Insufficient documentation

## 2024-01-30 DIAGNOSIS — N183 Chronic kidney disease, stage 3 unspecified: Secondary | ICD-10-CM | POA: Diagnosis present

## 2024-01-30 DIAGNOSIS — E785 Hyperlipidemia, unspecified: Secondary | ICD-10-CM | POA: Insufficient documentation

## 2024-01-30 DIAGNOSIS — R4182 Altered mental status, unspecified: Secondary | ICD-10-CM | POA: Diagnosis present

## 2024-01-30 DIAGNOSIS — I129 Hypertensive chronic kidney disease with stage 1 through stage 4 chronic kidney disease, or unspecified chronic kidney disease: Secondary | ICD-10-CM | POA: Diagnosis not present

## 2024-01-30 DIAGNOSIS — I10 Essential (primary) hypertension: Secondary | ICD-10-CM | POA: Diagnosis present

## 2024-01-30 DIAGNOSIS — G9341 Metabolic encephalopathy: Secondary | ICD-10-CM | POA: Diagnosis not present

## 2024-01-30 DIAGNOSIS — R3989 Other symptoms and signs involving the genitourinary system: Secondary | ICD-10-CM

## 2024-01-30 DIAGNOSIS — E7849 Other hyperlipidemia: Secondary | ICD-10-CM | POA: Diagnosis not present

## 2024-01-30 DIAGNOSIS — Z87891 Personal history of nicotine dependence: Secondary | ICD-10-CM | POA: Diagnosis not present

## 2024-01-30 DIAGNOSIS — Z79899 Other long term (current) drug therapy: Secondary | ICD-10-CM | POA: Diagnosis not present

## 2024-01-30 DIAGNOSIS — R531 Weakness: Secondary | ICD-10-CM | POA: Diagnosis not present

## 2024-01-30 DIAGNOSIS — E538 Deficiency of other specified B group vitamins: Secondary | ICD-10-CM | POA: Insufficient documentation

## 2024-01-30 LAB — COMPREHENSIVE METABOLIC PANEL WITH GFR
ALT: 12 U/L (ref 0–44)
AST: 23 U/L (ref 15–41)
Albumin: 3.4 g/dL — ABNORMAL LOW (ref 3.5–5.0)
Alkaline Phosphatase: 64 U/L (ref 38–126)
Anion gap: 18 — ABNORMAL HIGH (ref 5–15)
BUN: 31 mg/dL — ABNORMAL HIGH (ref 8–23)
CO2: 20 mmol/L — ABNORMAL LOW (ref 22–32)
Calcium: 9.2 mg/dL (ref 8.9–10.3)
Chloride: 105 mmol/L (ref 98–111)
Creatinine, Ser: 2.12 mg/dL — ABNORMAL HIGH (ref 0.44–1.00)
GFR, Estimated: 22 mL/min — ABNORMAL LOW (ref >60)
Glucose, Bld: 114 mg/dL — ABNORMAL HIGH (ref 70–99)
Potassium: 4.4 mmol/L (ref 3.5–5.1)
Sodium: 143 mmol/L (ref 135–145)
Total Bilirubin: 0.7 mg/dL (ref 0.0–1.2)
Total Protein: 7 g/dL (ref 6.5–8.1)

## 2024-01-30 LAB — CBC
HCT: 35.7 % — ABNORMAL LOW (ref 36.0–46.0)
Hemoglobin: 11.4 g/dL — ABNORMAL LOW (ref 12.0–15.0)
MCH: 22.6 pg — ABNORMAL LOW (ref 26.0–34.0)
MCHC: 31.9 g/dL (ref 30.0–36.0)
MCV: 70.8 fL — ABNORMAL LOW (ref 80.0–100.0)
Platelets: 219 K/uL (ref 150–400)
RBC: 5.04 MIL/uL (ref 3.87–5.11)
RDW: 15.5 % (ref 11.5–15.5)
WBC: 8.2 K/uL (ref 4.0–10.5)
nRBC: 0 % (ref 0.0–0.2)

## 2024-01-30 LAB — LACTIC ACID, PLASMA: Lactic Acid, Venous: 1.3 mmol/L (ref 0.5–1.9)

## 2024-01-30 LAB — URINALYSIS, ROUTINE W REFLEX MICROSCOPIC
Bilirubin Urine: NEGATIVE
Glucose, UA: NEGATIVE mg/dL
Ketones, ur: NEGATIVE mg/dL
Nitrite: NEGATIVE
Protein, ur: 100 mg/dL — AB
RBC / HPF: >50 RBC/hpf (ref 0–5)
Specific Gravity, Urine: 1.008 (ref 1.005–1.030)
pH: 6 (ref 5.0–8.0)

## 2024-01-30 LAB — TROPONIN I (HIGH SENSITIVITY)
Troponin I (High Sensitivity): 14 ng/L (ref <18)
Troponin I (High Sensitivity): 14 ng/L (ref <18)

## 2024-01-30 LAB — CBG MONITORING, ED: Glucose-Capillary: 109 mg/dL — ABNORMAL HIGH (ref 70–99)

## 2024-01-30 LAB — TSH: TSH: 1.47 u[IU]/mL (ref 0.350–4.500)

## 2024-01-30 MED ORDER — POLYETHYLENE GLYCOL 3350 17 G PO PACK: 17.0000 g | PACK | Freq: Every day | ORAL | Status: DC | PRN

## 2024-01-30 MED ORDER — LOSARTAN POTASSIUM 25 MG PO TABS
25.0000 mg | ORAL_TABLET | Freq: Every day | ORAL | Status: DC
Start: 2024-01-30 — End: 2024-02-04
  Administered 2024-01-30 – 2024-02-04 (×6): 25 mg via ORAL
  Filled 2024-01-30 (×6): qty 1

## 2024-01-30 MED ORDER — QUETIAPINE FUMARATE 25 MG PO TABS
125.0000 mg | ORAL_TABLET | Freq: Every day | ORAL | Status: DC
  Administered 2024-01-30: 125 mg via ORAL
  Filled 2024-01-30: qty 5

## 2024-01-30 MED ORDER — SODIUM CHLORIDE 0.9 % IV SOLN
2.0000 g | Freq: Once | INTRAVENOUS | Status: AC
  Administered 2024-01-30: 2 g via INTRAVENOUS
  Filled 2024-01-30: qty 20

## 2024-01-30 MED ORDER — HEPARIN SODIUM (PORCINE) 5000 UNIT/ML IJ SOLN
5000.0000 [IU] | Freq: Three times a day (TID) | INTRAMUSCULAR | Status: DC
  Administered 2024-01-30 – 2024-02-04 (×14): 5000 [IU] via SUBCUTANEOUS
  Filled 2024-01-30 (×14): qty 1

## 2024-01-30 MED ORDER — LAMOTRIGINE 100 MG PO TABS
100.0000 mg | ORAL_TABLET | Freq: Every day | ORAL | Status: DC
  Administered 2024-01-30 – 2024-02-04 (×6): 100 mg via ORAL
  Filled 2024-01-30 (×6): qty 1

## 2024-01-30 MED ORDER — LEVOTHYROXINE SODIUM 112 MCG PO TABS
112.0000 ug | ORAL_TABLET | Freq: Every day | ORAL | Status: DC
  Administered 2024-01-31 – 2024-02-04 (×5): 112 ug via ORAL
  Filled 2024-01-30 (×5): qty 1

## 2024-01-30 MED ORDER — ACETAMINOPHEN 325 MG RE SUPP: 650.0000 mg | Freq: Four times a day (QID) | RECTAL | Status: DC | PRN

## 2024-01-30 MED ORDER — PRAVASTATIN SODIUM 20 MG PO TABS
40.0000 mg | ORAL_TABLET | Freq: Every day | ORAL | Status: DC
  Administered 2024-01-30: 40 mg via ORAL
  Filled 2024-01-30: qty 2

## 2024-01-30 MED ORDER — SODIUM CHLORIDE 0.9 % IV BOLUS
1000.0000 mL | Freq: Once | INTRAVENOUS | Status: AC
  Administered 2024-01-30: 1000 mL via INTRAVENOUS

## 2024-01-30 MED ORDER — ACETAMINOPHEN 325 MG PO TABS
650.0000 mg | ORAL_TABLET | Freq: Four times a day (QID) | ORAL | Status: DC | PRN
  Administered 2024-01-30 – 2024-02-01 (×3): 650 mg via ORAL
  Filled 2024-01-30 (×3): qty 2

## 2024-01-30 MED ORDER — SODIUM CHLORIDE 0.9 % IV SOLN
2.0000 g | INTRAVENOUS | Status: DC
  Administered 2024-01-31 – 2024-02-02 (×3): 2 g via INTRAVENOUS
  Filled 2024-01-30 (×3): qty 20

## 2024-01-30 MED ORDER — PANTOPRAZOLE SODIUM 40 MG PO TBEC
40.0000 mg | DELAYED_RELEASE_TABLET | Freq: Every day | ORAL | Status: DC
  Administered 2024-01-30 – 2024-02-03 (×5): 40 mg via ORAL
  Filled 2024-01-30 (×6): qty 1

## 2024-01-30 MED ORDER — LATANOPROST 0.005 % OP SOLN
1.0000 [drp] | Freq: Every day | OPHTHALMIC | Status: DC
  Administered 2024-01-31 – 2024-02-03 (×4): 1 [drp] via OPHTHALMIC
  Filled 2024-01-30: qty 2.5

## 2024-01-30 MED ORDER — QUETIAPINE FUMARATE 25 MG PO TABS: 25.0000 mg | ORAL_TABLET | Freq: Every day | ORAL | Status: DC

## 2024-01-30 MED ORDER — METOPROLOL TARTRATE 5 MG/5ML IV SOLN: 5.0000 mg | Freq: Four times a day (QID) | INTRAVENOUS | Status: DC | PRN

## 2024-01-30 MED ORDER — QUETIAPINE FUMARATE 25 MG PO TABS: 100.0000 mg | ORAL_TABLET | Freq: Every day | ORAL | Status: DC

## 2024-01-30 MED ORDER — LACTATED RINGERS IV SOLN: INTRAVENOUS | Status: DC

## 2024-01-30 NOTE — Assessment & Plan Note (Signed)
 Continue statin

## 2024-01-30 NOTE — Assessment & Plan Note (Signed)
 Continue PPI.

## 2024-01-30 NOTE — ED Notes (Signed)
 First nurse note: [facility] from home for AMS. Per son pt would not wake up or respond appropriately to him this morning. Recent UTI, has completed abx. Baseline dementia. EMS stroke screen negative.   20# L AC, EMS VS: 143/69, HR 74, RR17, 97% RA, 26 etco2, T 99.8 oral, CBG 140

## 2024-01-30 NOTE — Progress Notes (Signed)
 Patient arrived to floor on stretcher, transfer to bed. Call bell given side table with in reach. Bed alarm on.

## 2024-01-30 NOTE — Assessment & Plan Note (Signed)
 TSH 1.47.  Continue levothyroxine 

## 2024-01-30 NOTE — Assessment & Plan Note (Signed)
 Continue losartan 

## 2024-01-30 NOTE — Assessment & Plan Note (Signed)
 Check urine culture Rocephin  Gentle IV fluid hydration

## 2024-01-30 NOTE — ED Triage Notes (Signed)
 Per charge RN- pt from home via ems for AMS. Pt states I just can't do anything x1 week. Pt c/o chronic neck pain. Pt alert oriented x4 at this time, NAD noted. Upper and lower extremities equal bilaterally, CMS intact.

## 2024-01-30 NOTE — Assessment & Plan Note (Addendum)
 Generalized weakness.  Physical therapy recommending rehab. Unfortunately patient did not qualify for inpatient 3 nights so will not be able to go to [facility]. Son is looking for LTC [facility] home health services ordered

## 2024-01-30 NOTE — ED Provider Notes (Signed)
 [facility] Provider Note    Event Date/Time   First MD Initiated Contact with Patient 01/30/24 1131     (approximate)   History   Altered Mental Status   HPI  Stephanie Anthony is a 89 year old female with history of hypertension, COPD, bipolar disorder presenting to the Emergency Department for evaluation of generalized weakness.  Patient was seen in our ER a few weeks ago.  She reports that since that time she has had progressive weakness.  Was diagnosed with UTI at that time, did antibiotics.  Denies falls or recent trauma.  Family at bedside notes that patient was too weak to get out of bed this morning and poorly responsive.  Family reports history of UTI causing hallucinations in the past.    Physical Exam   Triage Vital Signs: ED Triage Vitals  Encounter Vitals Group     BP 01/30/24 0917 (!) 123/55     Girls Systolic BP Percentile --      Girls Diastolic BP Percentile --      Boys Systolic BP Percentile --      Boys Diastolic BP Percentile --      Pulse Rate 01/30/24 0917 62     Resp 01/30/24 0917 16     Temp 01/30/24 0917 98.1 F (36.7 C)     Temp Source 01/30/24 0917 Oral     SpO2 01/30/24 0917 97 %     Weight 01/30/24 0918 202 lb (91.6 kg)     Height 01/30/24 0918 5' 7 (1.702 m)     Head Circumference --      Peak Flow --      Pain Score 01/30/24 0918 3     Pain Loc --      Pain Education --      Exclude from Growth Chart --     Most recent vital signs: Vitals:   01/30/24 1400 01/30/24 1406  BP: (!) 134/52   Pulse: (!) 57   Resp: 17   Temp:  98.1 F (36.7 C)  SpO2: 100%      General: Awake, interactive  CV:  Regular rate, good peripheral perfusion.  Resp:  Unlabored respirations, lungs clear to auscultation Abd:  Nondistended, soft, nontender Neuro:  Symmetric facial movement, fluid speech, moving extremity spontaneously and equally, mild generalized weakness   ED Results / Procedures / Treatments   Labs (all  labs ordered are listed, but only abnormal results are displayed) Labs Reviewed  COMPREHENSIVE METABOLIC PANEL WITH GFR - Abnormal; Notable for the following components:      Result Value   CO2 20 (*)    Glucose, Bld 114 (*)    BUN 31 (*)    Creatinine, Ser 2.12 (*)    Albumin 3.4 (*)    GFR, Estimated 22 (*)    Anion gap 18 (*)    All other components within normal limits  CBC - Abnormal; Notable for the following components:   Hemoglobin 11.4 (*)    HCT 35.7 (*)    MCV 70.8 (*)    MCH 22.6 (*)    All other components within normal limits  URINALYSIS, ROUTINE W REFLEX MICROSCOPIC - Abnormal; Notable for the following components:   Color, Urine AMBER (*)    APPearance CLOUDY (*)    Hgb urine dipstick LARGE (*)    Protein, ur 100 (*)    Leukocytes,Ua LARGE (*)    Bacteria, UA MANY (*)    All other components  within normal limits  CBG MONITORING, ED - Abnormal; Notable for the following components:   Glucose-Capillary 109 (*)    All other components within normal limits  URINE CULTURE  LACTIC ACID, PLASMA  TROPONIN I (HIGH SENSITIVITY)  TROPONIN I (HIGH SENSITIVITY)     EKG EKG independently reviewed and interpreted by myself demonstrates:  EKG demonstrates normal sinus rhythm at a rate of 60, PR 160, QRS 94, QTc 426, nonspecific ST changes noted  RADIOLOGY Imaging independently reviewed and interpreted by myself demonstrates:   Formal Radiology Read:  No results found.  PROCEDURES:  Critical Care performed: No  Procedures   MEDICATIONS ORDERED IN ED: Medications  cefTRIAXone  (ROCEPHIN ) 2 g in sodium chloride  0.9 % 100 mL IVPB (2 g Intravenous New Bag/Given 01/30/24 1436)  sodium chloride  0.9 % bolus 1,000 mL (0 mLs Intravenous Stopped 01/30/24 1403)     IMPRESSION / MDM / ASSESSMENT AND PLAN / ED COURSE  I reviewed the triage vital signs and the nursing notes.  Differential diagnosis includes, but is not limited to, anemia, electrolyte abnormality,  dehydration, UTI, other infection, atypical ACS  Patient's presentation is most consistent with acute presentation with potential threat to life or bodily function.  89 year old female presenting to the ER for generalized weakness.  Labs with stable anemia, CMP with stable renal impairment.  Mild anion gap acidosis noted, likely related to dehydration.  Negative troponin and lactate.  Will treat symptomatically with IV fluids.  Urinalysis pending.  Urinalysis urinalysis was not a completely clean sample, but concerning for infection with large leukocyte esterase, 21-50 white blood cells with white blood cell clumps and many bacteria.  Suspect that patient's persistent UTI is likely contributing to her progressive weakness and mental status changes.  With this, do think she is appropriate for admission.  Patient does not meet SIRS criteria.  Order for Rocephin  and urine culture sent.  Patient and family agreeable with plan for admission..  Will reach out to hospitalist team.  Clinical Course as of 01/30/24 1449  Sat Jan 30, 2024  1441 Case discussed with hospitalist team.  They will evaluate for anticipated admission. [NR]    Clinical Course User Index [NR] Levander Slate, MD     FINAL CLINICAL IMPRESSION(S) / ED DIAGNOSES   Final diagnoses:  Acute cystitis without hematuria  Generalized weakness     Rx / DC Orders   ED Discharge Orders     None        Note:  This document was prepared using Dragon voice recognition software and may include unintentional dictation errors.   Levander Slate, MD 01/30/24 [phone]

## 2024-01-30 NOTE — Hospital Course (Signed)
 Patient is an 89 year old with history of bipolar disorder, hypothyroidism, CKD stage IV, HTN, HLD, GERD, recent UTI who presents with generalized weakness from home.  She was brought in approximately 1 week ago and found to have a UTI at that time.  She is taking all her meds as directed but has been unable to get up and move today.  She reports poor appetite and little desire to eat over the last few days.  Of note the patient has history of lower extremity edema that has required home wound care to bilateral lower extremities.  Additionally the patient was admitted to geropsych in June of this year.  A lot of the history was obtained from the son who stays with her approximately 4 days a week.  He is unsure if this is psychiatric or if something else is going on.  He called EMS because he was unable to get her out of bed today.  In the ED she was noted to have large hemoglobin protein and large leukocytes in the urine consistent with possible UTI.  She has a stable creatinine at 2.12.

## 2024-01-30 NOTE — Plan of Care (Signed)
  Problem: Education: Goal: Knowledge of General Education information will improve Description: Including pain rating scale, medication(s)/side effects and non-pharmacologic comfort measures Outcome: Progressing   Problem: Health Behavior/Discharge Planning: Goal: Ability to manage health-related needs will improve Outcome: Progressing   Problem: Clinical Measurements: Goal: Ability to maintain clinical measurements within normal limits will improve Outcome: Progressing   Problem: Activity: Goal: Risk for activity intolerance will decrease Outcome: Progressing   Problem: Nutrition: Goal: Adequate nutrition will be maintained Outcome: Progressing   Problem: Coping: Goal: Level of anxiety will decrease Outcome: Progressing   Problem: Pain Managment: Goal: General experience of comfort will improve and/or be controlled Outcome: Progressing   Problem: Safety: Goal: Ability to remain free from injury will improve Outcome: Progressing   Problem: Skin Integrity: Goal: Risk for impaired skin integrity will decrease Outcome: Progressing

## 2024-01-30 NOTE — Assessment & Plan Note (Signed)
 Appears stable according to the son this is related to lithium use for years. Avoid nephrotoxic agents Trend Follows with nephrology as an outpatient The patient has stated she really would not prefer to do dialysis should that worsen.

## 2024-01-30 NOTE — Assessment & Plan Note (Addendum)
 Continue Lamictal  and lower the dose of Seroquel  to 50 mg at night with increased creatinine.  Seen by psychiatry and did not recommend any other medication changes.

## 2024-01-30 NOTE — Assessment & Plan Note (Signed)
 Patient currently with wraps on Will have wound ostomy eval and treat as needed

## 2024-01-30 NOTE — ED Notes (Signed)
 Patient's son would like a 5 hour call ahead before a discharge due to him living in [place] [employer].

## 2024-01-30 NOTE — Consult Note (Signed)
 WOC Nurse Consult Note: patient with history of venous insufficiency, has worn unna boots in past but developed itching from these  Reason for Consult:BLE wounds  Wound type: partial and full thickness wounds B lower extremities r/t venous insufficiency  Pressure Injury POA: NA not pressure related  Measurement: see nursing flowsheet  Wound bed: scattered over B lower legs pink moist, some dry brown  Drainage (amount, consistency, odor) see nursing flowsheet  Periwound: evidence of old healed ulcerations and some scabbed areas  Dressing procedure/placement/frequency: Cleanse B lower extremity wounds with NS, dry and apply Xeroform gauze ([place] [phone]) to wound beds daily, cover with ABD pads and secure with Kerlix roll gauze beginning right above toes and ending right below knees.  Apply Ace bandage wrapped in same fashion as Kerlix for light compression.   Patient should elevate legs as much as possible.    POC discussed with bedside nurse. WOC team will not follow. Re-consult if further needs arise.   Thank you,    Powell Bar MSN, RN-BC, [employer]

## 2024-01-30 NOTE — H&P (Signed)
 History and Physical    Patient: Stephanie Anthony DOB: [birth year] DOA: 01/30/2024 DOS: the patient was seen and examined on 01/30/2024 PCP: Alla Amis, MD  Patient coming from: Home  Chief Complaint:  Chief Complaint  Patient presents with   Altered Mental Status   HPI: Stephanie Anthony is a 89 y.o. female with medical history significant of bipolar disorder, hypothyroidism, CKD stage IV, HTN, HLD, GERD, recent UTI who presents with generalized weakness from home.  She was brought in approximately 1 week ago and found to have a UTI at that time.  She is taking all her meds as directed but has been unable to get up and move today.  She reports poor appetite and little desire to eat over the last few days.  Of note the patient has history of lower extremity edema that has required home wound care to bilateral lower extremities.  Additionally the patient was admitted to geropsych in June of this year.  A lot of the history was obtained from the son who stays with her approximately 4 days a week.  He is unsure if this is psychiatric or if something else is going on.  He called EMS because he was unable to get her out of bed today.  In the ED she was noted to have large hemoglobin protein and large leukocytes in the urine consistent with possible UTI.  She has a stable creatinine at 2.12.   Review of Systems: As mentioned in the history of present illness. All other systems reviewed and are negative. Past Medical History:  Diagnosis Date   Anemia    Anxiety    Arthritis    Bipolar 1 disorder (HCC)    Cataracts, bilateral    Chronic kidney disease    stage 3   Chronic kidney disease    Chronic mitral valve disease    COPD (chronic obstructive pulmonary disease) (HCC)    Coronary artery disease    Deviated nasal septum    Dyspnea    Erosive esophagitis    Erosive esophagitis    Esophageal stricture    Fibroid tumor    GERD (gastroesophageal reflux disease)     History of hiatal hernia    Hypertension    Hypothyroidism    Murmur, cardiac    Nephrogenic diabetes insipidus (HCC)    Pure hypercholesterolemia    Renal cyst, left    bilateral   Seasonal allergies    Past Surgical History:  Procedure Laterality Date   APPENDECTOMY     CARDIAC CATHETERIZATION     CHOLECYSTECTOMY     COLONOSCOPY WITH PROPOFOL  N/A 06/01/2018   Procedure: COLONOSCOPY WITH PROPOFOL ;  Surgeon: [name], MD;  Location: [facility] ENDOSCOPY;  Service: Endoscopy;  Laterality: N/A;   ESOPHAGEAL DILATION     ESOPHAGOGASTRODUODENOSCOPY (EGD) WITH PROPOFOL  N/A 10/20/2014   Procedure: ESOPHAGOGASTRODUODENOSCOPY (EGD) WITH PROPOFOL ;  Surgeon: Gladis PENNER Gaylyn, MD;  Location: [facility] ENDOSCOPY;  Service: Endoscopy;  Laterality: N/A;   ESOPHAGOGASTRODUODENOSCOPY (EGD) WITH PROPOFOL  N/A 06/01/2018   Procedure: ESOPHAGOGASTRODUODENOSCOPY (EGD) WITH PROPOFOL ;  Surgeon: [name], MD;  Location: [facility] ENDOSCOPY;  Service: Endoscopy;  Laterality: N/A;   HEMORRHOID SURGERY N/A 12/25/2023   Procedure: HEMORRHOIDECTOMY;  Surgeon: [name], MD;  Location: [facility] ORS;  Service: General;  Laterality: N/A;   INFECTED SKIN DEBRIDEMENT     on back several years ago   JOINT REPLACEMENT     KNEE ARTHROPLASTY Left 06/10/2023   Procedure: COMPUTER ASSISTED TOTAL KNEE  ARTHROPLASTY;  Surgeon: Mardee Lynwood SQUIBB, MD;  Location: [facility] ORS;  Service: Orthopedics;  Laterality: Left;   knee replacement, right     LAPAROSCOPIC APPENDECTOMY N/A 06/21/2018   Procedure: APPENDECTOMY LAPAROSCOPIC;  Surgeon: [name], MD;  Location: [facility] ORS;  Service: General;  Laterality: N/A;   NOSE SURGERY     THYROIDECTOMY     TOTAL THYROIDECTOMY     Social History:  reports that she has quit smoking. She has never used smokeless tobacco. She reports that she does not drink alcohol and does not use drugs.  Allergies  Allergen Reactions   Aspirin  Other (See Comments)    Rectal Bleeding    Prednisone Other (See Comments)    Hallucinations   Codeine Nausea And Vomiting   Valium [Diazepam] Other (See Comments)    Uknown     Family History  Problem Relation Age of Onset   Heart Problems Mother    Seizures Mother    Stroke Mother    Diverticulitis Mother    Alcohol abuse Father    Heart attack Father    Heart attack Sister    Pancreatitis Sister    Cancer Sister    Cancer Brother     Prior to Admission medications   Medication Sig Start Date End Date Taking? Authorizing Provider  gabapentin  (NEURONTIN ) 100 MG capsule Take 1 capsule (100 mg total) by mouth 3 (three) times daily. 11/15/23   Donnelly Mellow, MD  hydrocortisone  (ANUSOL -HC) 2.5 % rectal cream Place rectally 4 (four) times daily. Patient not taking: Reported on 12/15/2023 11/05/23   Jadapalle, Sree, MD  lamoTRIgine  (LAMICTAL ) 100 MG tablet Take 1 tablet (100 mg total) by mouth daily. 12/28/23 04/26/24  Vickey Mettle, MD  latanoprost  (XALATAN ) 0.005 % ophthalmic solution Place 1 drop into both eyes at bedtime. 11/15/23   Jadapalle, Sree, MD  levothyroxine  (SYNTHROID ) 100 MCG tablet Take 100 mcg by mouth daily before breakfast. Patient not taking: Reported on 12/28/2023    [provider]  levothyroxine  (SYNTHROID ) 112 MCG tablet Take 1 tablet (112 mcg total) by mouth daily at 6 (six) AM. 11/15/23   Donnelly Mellow, MD  losartan  (COZAAR ) 25 MG tablet Take 25 mg by mouth daily.    [provider]  lovastatin (MEVACOR) 40 MG tablet Take 40 mg by mouth every evening. 11/26/23   [provider]  Multiple Vitamin (MULTIVITAMIN WITH MINERALS) TABS tablet Take 1 tablet by mouth daily. Patient not taking: Reported on 12/15/2023 11/15/23   Jadapalle, Sree, MD  oxyCODONE  (ROXICODONE ) 5 MG immediate release tablet Take 1 tablet (5 mg total) by mouth every 8 (eight) hours as needed. 12/25/23 12/24/24  [name], MD  pantoprazole  (PROTONIX ) 40 MG tablet Take 40 mg by mouth daily.     [provider]  pravastatin  (PRAVACHOL ) 10 MG tablet Take 1 tablet (10 mg total) by mouth daily at 6 PM. Patient not taking: Reported on 12/15/2023 11/05/23   Jadapalle, Sree, MD  QUEtiapine  (SEROQUEL ) 100 MG tablet Take 1 tablet (100 mg total) by mouth at bedtime. 12/28/23 02/26/24  Vickey Mettle, MD  QUEtiapine  (SEROQUEL ) 25 MG tablet Take 1 tablet (25 mg total) by mouth at bedtime. Total of 125 mg at night. Take along with 100 mg tab 12/28/23 02/26/24  Vickey Mettle, MD    Physical Exam: Vitals:   01/30/24 0918 01/30/24 1330 01/30/24 1400 01/30/24 1406  BP:  (!) 119/50 (!) 134/52   Pulse:  (!) 59 (!) 57   Resp:  16  17   Temp:    98.1 F (36.7 C)  TempSrc:    Oral  SpO2:  100% 100%   Weight: 91.6 kg     Height: 5' 7 (1.702 m)      Physical Examination: General appearance - alert, well appearing, and in no distress Chest - clear to auscultation, no wheezes, rales or rhonchi, symmetric air entry Heart - normal rate, regular rhythm, normal S1, S2, no murmurs, rubs, clicks or gallops Abdomen - soft, nontender, nondistended, no masses or organomegaly Neurological - alert, oriented, normal speech, no focal findings or movement disorder noted Extremities - pedal edema 2-3+, legs are wrapped with Coban and gauze  Data Reviewed: Results for orders placed or performed during the hospital encounter of 01/30/24 (from the past 24 hours)  Comprehensive metabolic panel     Status: Abnormal   Collection Time: 01/30/24  9:17 AM  Result Value Ref Range   Sodium 143 135 - 145 mmol/L   Potassium 4.4 3.5 - 5.1 mmol/L   Chloride 105 98 - 111 mmol/L   CO2 20 (L) 22 - 32 mmol/L   Glucose, Bld 114 (H) 70 - 99 mg/dL   BUN 31 (H) 8 - 23 mg/dL   Creatinine, Ser 7.87 (H) 0.44 - 1.00 mg/dL   Calcium 9.2 8.9 - 89.6 mg/dL   Total Protein 7.0 6.5 - 8.1 g/dL   Albumin 3.4 (L) 3.5 - 5.0 g/dL   AST 23 15 - 41 U/L   ALT 12 0 - 44 U/L   Alkaline Phosphatase 64 38 - 126 U/L   Total Bilirubin 0.7 0.0 - 1.2 mg/dL    GFR, Estimated 22 (L) >60 mL/min   Anion gap 18 (H) 5 - 15  CBC     Status: Abnormal   Collection Time: 01/30/24  9:17 AM  Result Value Ref Range   WBC 8.2 4.0 - 10.5 K/uL   RBC 5.04 3.87 - 5.11 MIL/uL   Hemoglobin 11.4 (L) 12.0 - 15.0 g/dL   HCT 64.2 (L) 63.9 - 53.9 %   MCV 70.8 (L) 80.0 - 100.0 fL   MCH 22.6 (L) 26.0 - 34.0 pg   MCHC 31.9 30.0 - 36.0 g/dL   RDW 84.4 88.4 - 84.4 %   Platelets 219 150 - 400 K/uL   nRBC 0.0 0.0 - 0.2 %  Troponin I (High Sensitivity)     Status: None   Collection Time: 01/30/24  9:17 AM  Result Value Ref Range   Troponin I (High Sensitivity) 14 <18 ng/L  Lactic acid, plasma     Status: None   Collection Time: 01/30/24  9:17 AM  Result Value Ref Range   Lactic Acid, Venous 1.3 0.5 - 1.9 mmol/L  CBG monitoring, ED     Status: Abnormal   Collection Time: 01/30/24  9:25 AM  Result Value Ref Range   Glucose-Capillary 109 (H) 70 - 99 mg/dL  Troponin I (High Sensitivity)     Status: None   Collection Time: 01/30/24 12:02 PM  Result Value Ref Range   Troponin I (High Sensitivity) 14 <18 ng/L  TSH     Status: None   Collection Time: 01/30/24 12:02 PM  Result Value Ref Range   TSH 1.470 0.350 - 4.500 uIU/mL  Urinalysis, Routine w reflex microscopic -Urine, Clean Catch     Status: Abnormal   Collection Time: 01/30/24 12:17 PM  Result Value Ref Range   Color, Urine AMBER (A) YELLOW   APPearance CLOUDY (A)  CLEAR   Specific Gravity, Urine 1.008 1.005 - 1.030   pH 6.0 5.0 - 8.0   Glucose, UA NEGATIVE NEGATIVE mg/dL   Hgb urine dipstick LARGE (A) NEGATIVE   Bilirubin Urine NEGATIVE NEGATIVE   Ketones, ur NEGATIVE NEGATIVE mg/dL   Protein, ur 899 (A) NEGATIVE mg/dL   Nitrite NEGATIVE NEGATIVE   Leukocytes,Ua LARGE (A) NEGATIVE   RBC / HPF >50 0 - 5 RBC/hpf   WBC, UA 21-50 0 - 5 WBC/hpf   Bacteria, UA MANY (A) NONE SEEN   Squamous Epithelial / HPF 21-50 0 - 5 /HPF   WBC Clumps PRESENT    Mucus PRESENT      Assessment and Plan: * Urinary  tract infection without hematuria Check urine culture Rocephin  Gentle IV fluid hydration  Bipolar disorder (HCC) Continue Lamictal  Continue Seroquel   Peripheral edema Patient currently with wraps on Will have wound ostomy eval and treat as needed  Weakness Unclear etiology Rule out hypothyroid that is poorly treated Treat any UTI Gentle IV hydration The patient's son does not really want geropsych or psychiatry to be involved at this visit she is taking her meds as directed.  But this also could be a problem with her weakness. Check B12 and folate PT to eval and treat and see if she is a candidate to remain independently living at home. Will involve palliative for goals of care discussion.  Gastroesophageal reflux disease without esophagitis Continue PPI  HLD (hyperlipidemia) Continue statin  Hypertension Continue losartan   CKD (chronic kidney disease), stage IV (HCC) Appears stable according to the son this is related to lithium use for years. Avoid nephrotoxic agents Trend Follows with nephrology as an outpatient The patient has stated she really would not prefer to do dialysis should that worsen.  Hypothyroidism Last TSH was over 9 Continue levothyroxine  Check TSH and consider increasing dose      Advance Care Planning:   Code Status: Prior full, confirmed with patient and her son  Consults: Palliative  Family Communication: Son at bedside  Severity of Illness: The appropriate patient status for this patient is OBSERVATION. Observation status is judged to be reasonable and necessary in order to provide the required intensity of service to ensure the patient's safety. The patient's presenting symptoms, physical exam findings, and initial radiographic and laboratory data in the context of their medical condition is felt to place them at decreased risk for further clinical deterioration. Furthermore, it is anticipated that the patient will be medically stable for  discharge from the hospital within 2 midnights of admission.   Author: Glenys GORMAN Birk, MD 01/30/2024 2:42 PM  For on call review [email or web address].

## 2024-01-31 DIAGNOSIS — N179 Acute kidney failure, unspecified: Secondary | ICD-10-CM

## 2024-01-31 DIAGNOSIS — N189 Chronic kidney disease, unspecified: Secondary | ICD-10-CM

## 2024-01-31 DIAGNOSIS — Z789 Other specified health status: Secondary | ICD-10-CM | POA: Diagnosis not present

## 2024-01-31 DIAGNOSIS — E785 Hyperlipidemia, unspecified: Secondary | ICD-10-CM

## 2024-01-31 DIAGNOSIS — K219 Gastro-esophageal reflux disease without esophagitis: Secondary | ICD-10-CM

## 2024-01-31 DIAGNOSIS — G9341 Metabolic encephalopathy: Secondary | ICD-10-CM

## 2024-01-31 DIAGNOSIS — N3000 Acute cystitis without hematuria: Secondary | ICD-10-CM | POA: Diagnosis not present

## 2024-01-31 DIAGNOSIS — Z515 Encounter for palliative care: Secondary | ICD-10-CM | POA: Diagnosis not present

## 2024-01-31 DIAGNOSIS — I10 Essential (primary) hypertension: Secondary | ICD-10-CM

## 2024-01-31 DIAGNOSIS — F3111 Bipolar disorder, current episode manic without psychotic features, mild: Secondary | ICD-10-CM

## 2024-01-31 DIAGNOSIS — R3989 Other symptoms and signs involving the genitourinary system: Secondary | ICD-10-CM | POA: Diagnosis not present

## 2024-01-31 DIAGNOSIS — F319 Bipolar disorder, unspecified: Secondary | ICD-10-CM | POA: Diagnosis not present

## 2024-01-31 DIAGNOSIS — R531 Weakness: Secondary | ICD-10-CM | POA: Diagnosis not present

## 2024-01-31 DIAGNOSIS — E039 Hypothyroidism, unspecified: Secondary | ICD-10-CM

## 2024-01-31 DIAGNOSIS — R600 Localized edema: Secondary | ICD-10-CM

## 2024-01-31 DIAGNOSIS — Z7189 Other specified counseling: Secondary | ICD-10-CM

## 2024-01-31 LAB — COMPREHENSIVE METABOLIC PANEL WITH GFR
ALT: 10 U/L (ref 0–44)
AST: 17 U/L (ref 15–41)
Albumin: 3.2 g/dL — ABNORMAL LOW (ref 3.5–5.0)
Alkaline Phosphatase: 57 U/L (ref 38–126)
Anion gap: 8 (ref 5–15)
BUN: 34 mg/dL — ABNORMAL HIGH (ref 8–23)
CO2: 21 mmol/L — ABNORMAL LOW (ref 22–32)
Calcium: 8.7 mg/dL — ABNORMAL LOW (ref 8.9–10.3)
Chloride: 112 mmol/L — ABNORMAL HIGH (ref 98–111)
Creatinine, Ser: 1.94 mg/dL — ABNORMAL HIGH (ref 0.44–1.00)
GFR, Estimated: 25 mL/min — ABNORMAL LOW (ref >60)
Glucose, Bld: 106 mg/dL — ABNORMAL HIGH (ref 70–99)
Potassium: 4.2 mmol/L (ref 3.5–5.1)
Sodium: 141 mmol/L (ref 135–145)
Total Bilirubin: 0.6 mg/dL (ref 0.0–1.2)
Total Protein: 6.4 g/dL — ABNORMAL LOW (ref 6.5–8.1)

## 2024-01-31 LAB — CBC
HCT: 32.7 % — ABNORMAL LOW (ref 36.0–46.0)
Hemoglobin: 10.2 g/dL — ABNORMAL LOW (ref 12.0–15.0)
MCH: 22.1 pg — ABNORMAL LOW (ref 26.0–34.0)
MCHC: 31.2 g/dL (ref 30.0–36.0)
MCV: 70.9 fL — ABNORMAL LOW (ref 80.0–100.0)
Platelets: 178 K/uL (ref 150–400)
RBC: 4.61 MIL/uL (ref 3.87–5.11)
RDW: 15.6 % — ABNORMAL HIGH (ref 11.5–15.5)
WBC: 6 K/uL (ref 4.0–10.5)
nRBC: 0 % (ref 0.0–0.2)

## 2024-01-31 LAB — URINALYSIS, W/ REFLEX TO CULTURE (INFECTION SUSPECTED)
Bilirubin Urine: NEGATIVE
Glucose, UA: NEGATIVE mg/dL
Ketones, ur: NEGATIVE mg/dL
Nitrite: NEGATIVE
Protein, ur: NEGATIVE mg/dL
Specific Gravity, Urine: 1.009 (ref 1.005–1.030)
pH: 5 (ref 5.0–8.0)

## 2024-01-31 LAB — URINE CULTURE

## 2024-01-31 LAB — VITAMIN B12: Vitamin B-12: 288 pg/mL (ref 180–914)

## 2024-01-31 MED ORDER — QUETIAPINE FUMARATE 25 MG PO TABS
50.0000 mg | ORAL_TABLET | Freq: Every day | ORAL | Status: DC
  Administered 2024-01-31 – 2024-02-03 (×4): 50 mg via ORAL
  Filled 2024-01-31 (×4): qty 2

## 2024-01-31 MED ORDER — LACTATED RINGERS IV SOLN: INTRAVENOUS | Status: DC

## 2024-01-31 NOTE — Care Management Obs Status (Signed)
 MEDICARE OBSERVATION STATUS NOTIFICATION   Patient Details  Name: Stephanie Anthony MRN: [id number] Date of Birth: 1933/11/14   Medicare Observation Status Notification Given:       Victory Jackquline RAMAN, RN 01/31/2024, 3:45 PM

## 2024-01-31 NOTE — Consult Note (Signed)
 Consultation Note Date: 01/31/2024 at 1030  Patient Name: Stephanie Anthony  DOB: 10-23-1933  MRN: [id number]  Age / Sex: 89 y.o., female  PCP: Alla Amis, MD Referring Physician: Josette Ade, MD  HPI/Patient Profile: 89 y.o. female  with past medical history significant for bipolar disorder, hypothyroidism, CKD stage IV, HTN, HLD and GERD. Patient presented to ED 01/30/2024 c/o weakness, cough and shortness of breath.  Pt reported that she was unable to get up and walk and no appetite for a few days PTA. Son at the bedside reported that he was unable to get his mother out of bed prompting call to EMS.  Of note, patient was seen and treated in ED 1 week prior for UTI and sent home with antibiotics. She also has history of lower extremity edema requiring home wound care.   ED labs showed BUN 31, creatinine 2.08, GFR 23, Hgb 10.5, Covid, flu, RSV negative. UA + for moderate hgb and small leukocytes.   ED vitals 123/55. HE 62, RR 16, SpO2 97% RA and 98.62F.  TRH was consulted for admission and management of urinary tract infection without hematuria, weakness, peripheral edema.   Palliative care was consulted for assistance with goals of care conversations.   Clinical Assessment and Goals of Care: Extensive chart review completed prior to meeting patient including labs, vital signs, imaging, progress notes, orders, and available advanced directive documents from current and previous encounters. I then met with patient and son to discuss diagnosis prognosis, GOC, EOL wishes, disposition and options.     Latest Ref Rng & Units 01/31/2024    5:46 AM 01/30/2024    9:17 AM 01/17/2024    3:56 PM  CBC  WBC 4.0 - 10.5 K/uL 6.0  8.2  7.5   Hemoglobin 12.0 - 15.0 g/dL 89.7  88.5  89.4   Hematocrit 36.0 - 46.0 % 32.7  35.7  32.8   Platelets 150 - 400 K/uL 178  219  305       Latest Ref Rng & Units  01/31/2024    5:46 AM 01/30/2024    9:17 AM 01/17/2024    3:56 PM  CMP  Glucose 70 - 99 mg/dL 893  885  862   BUN 8 - 23 mg/dL 34  31  31   Creatinine 0.44 - 1.00 mg/dL 8.05  7.87  7.91   Sodium 135 - 145 mmol/L 141  143  142   Potassium 3.5 - 5.1 mmol/L 4.2  4.4  4.6   Chloride 98 - 111 mmol/L 112  105  107   CO2 22 - 32 mmol/L 21  20  23    Calcium 8.9 - 10.3 mg/dL 8.7  9.2  9.6   Total Protein 6.5 - 8.1 g/dL 6.4  7.0  7.6   Total Bilirubin 0.0 - 1.2 mg/dL 0.6  0.7  0.6   Alkaline Phos 38 - 126 U/L 57  64  72   AST 15 - 41 U/L 17  23  22    ALT  0 - 44 U/L 10  12  10       Ill-appearing female sitting upright in recliner. She is alert and oriented to self, time, location and situation. She is able to participate in conversation making her wishes known. Respirations are even and unlabored. She is in no distress.   Patient reports not feeling well today. She c/o chronic neck pain. Endorses poor appetite but was able to eat approximately 50% breakfast tray. Was not able to sleep due to staff being in/out of room all night. She endorses some SOB but denies CP.   I introduced Palliative Medicine as specialized medical care for people living with serious illness. It focuses on providing relief from the symptoms and stress of a serious illness. The goal is to improve quality of life for both the patient and the family.  We discussed a brief life review of the patient. Stephanie Anthony was married to her husband for 48 years. She is now widowed after her husband died in 2001/02/08. They had 1 son and one daughter. Patient's daughter died approximately one month ago. She has over 20 grandchildren. 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   As far as functional and nutritional status, Stephanie Anthony states she has been independent in her home with no use of a walker or cane. She shares that she has experienced more difficulty walking and does not think she is able to care for herself or be in her home alone  any longer. Her appetite has been poor but started when she did not have the energy to prepare meals. She states she has been eating TV dinners and apples for her meals.  We discussed patient's current illness and what it means in the larger context of patient's on-going co-morbidities.  Natural disease trajectory and expectations at EOL were discussed. Patient shares that she is aware of her decline and her brain is telling her she it too weak to care for herself any longer.   I attempted to elicit values and goals of care important to the patient.  Goal is to be back where she was before taking care of herself in her home.   The difference between aggressive medical intervention and comfort care was considered in light of the patient's goals of care.   Advance directives, concepts specific to code status, artificial feeding and hydration, and rehospitalization were considered and discussed. Stephanie Anthony continues to want CPR and temporary ventilation if there were a chance she could get better. She is adamant that she does not want long-term ventilation, tracheostomy of PEG tube. [name] is supportive of his mother's wishes for care.   Education offered regarding concept specific to human mortality and the limitations of medical interventions to prolong life when the body begins to fail to thrive. Both patient and her son, [name], agree that patient is not longer able to care for herself in her home. She is open to rehab but does not feel she has the strength to rehabilitate due to persisting weakness. [name] agrees but wants his mother to try rehab to see if it will help.  [name] shares that he feels some of his mother not wanting to rehab may be related to depression after her daughter's death just last month.   Family is facing treatment option decisions, advanced directive, and anticipatory care needs. Ms. Tallman confirms that [name] would be her surrogate decision maker should she become confused and  unable to make decisions for herself.  [name]  states he is going to an elder attorney this week to see if he can get his mother's affairs in order.    Discussed with patient/family the importance of continued conversation with family and the medical providers regarding overall plan of care and treatment options, ensuring decisions are within the context of the patient's values and GOCs.    Hospice and Palliative Care services outpatient were explained and offered. [name] is not interested in palliative services at this time. He indicates this may change if his mother were to go to a long term care facility.   Questions and concerns were addressed. The family was encouraged to call with questions or concerns.   Primary Decision Maker PATIENT  Physical Exam Vitals reviewed.  Constitutional:      General: She is not in acute distress.    Appearance: She is ill-appearing.  HENT:     Head: Normocephalic and atraumatic.     Mouth/Throat:     Mouth: Mucous membranes are moist.  Pulmonary:     Effort: Pulmonary effort is normal. No respiratory distress.  Musculoskeletal:     Right lower leg: Edema present.     Left lower leg: Edema present.     Comments: Wound dressings intact to BL LE   Skin:    General: Skin is warm and dry.  Neurological:     Mental Status: She is alert and oriented to person, place, and time.     Motor: Weakness present.  Psychiatric:        Mood and Affect: Mood normal. Affect is flat.        Behavior: Behavior normal.        Thought Content: Thought content normal.        Judgment: Judgment normal.   Recommendations/Plan: FULL CODE status as previously documented    Continue current supportive interventions Disposition TBD PMT to follow peripherally for needs   Palliative Assessment/Data: 40-50%   Discussed plan of care with primary RN.   Thank you for this consult. Palliative medicine will continue to follow and assist holistically.   Time Total: 90  minutes  Time spent includes: Detailed review of medical records (labs, imaging, vital signs), medically appropriate exam (mental status, respiratory, cardiac, skin), discussed with treatment team, counseling and educating patient, family and staff, documenting clinical information, medication management and coordination of care.     Devere Sacks, AMANDA [facility] Palliative Medicine Team  01/31/2024 8:35 AM  Office [phone]  Pager [phone]     Please contact Palliative Medicine Team providers via [employer]ION for questions and concerns.

## 2024-01-31 NOTE — Assessment & Plan Note (Addendum)
 Acute kidney injury on CKD stage IV.  Creatinine 2.12 on presentation and down to 1.92 with gentle fluids.  Looking back creatinine was 1.6 in June.  Hold on further fluids.  Patient would not want dialysis.

## 2024-01-31 NOTE — Progress Notes (Signed)
 Progress Note   Patient: Stephanie Anthony FMW:[id number] DOB: 1934-04-04 DOA: 01/30/2024     0 DOS: the patient was seen and examined on 01/31/2024   Brief hospital course: Patient is an 89 year old with history of bipolar disorder, hypothyroidism, CKD stage IV, HTN, HLD, GERD, recent UTI who presents with generalized weakness from home.  She was brought in approximately 1 week ago and found to have a UTI at that time.  She is taking all her meds as directed but has been unable to get up and move today.  She reports poor appetite and little desire to eat over the last few days.  Of note the patient has history of lower extremity edema that has required home wound care to bilateral lower extremities.  Additionally the patient was admitted to geropsych in June of this year.  A lot of the history was obtained from the son who stays with her approximately 4 days a week.  He is unsure if this is psychiatric or if something else is going on.  He called EMS because he was unable to get her out of bed today.  In the ED she was noted to have large hemoglobin protein and large leukocytes in the urine consistent with possible UTI.  She has a stable creatinine at 2.12.   Assessment and Plan: * Acute kidney injury superimposed on CKD (HCC) Acute kidney injury on CKD stage IV.  Creatinine 2.12 on presentation and down to 1.94 with gentle fluids.  Looking back creatinine was 1.6 in June.  Continue gentle fluids.  Suspected UTI Resend the urine analysis and reflex to culture if positive.  On Rocephin  empirically  Bipolar disorder (HCC) Continue Lamictal  and lower the dose of Seroquel  to 50 mg at night with increased creatinine.  Weakness Generalized weakness.  Physical therapy recommending rehab.  Acute metabolic encephalopathy Seems improved.  Continue to monitor.  Gastroesophageal reflux disease without esophagitis Continue PPI  HLD (hyperlipidemia) Will give a statin holiday with  weakness  Hypertension Continue losartan   Hypothyroidism TSH 1.47.  Continue levothyroxine         Subjective: Patient feeling weak.  Physical therapy recommending rehab.  Brought in with altered mental status.  Physical Exam: Vitals:   01/30/24 1616 01/30/24 2001 01/31/24 0441 01/31/24 0812  BP: (!) 133/54 (!) 120/46 (!) 118/46 (!) 117/53  Pulse: 67 68 85 70  Resp: 18 20 16 18   Temp: 99.1 F (37.3 C) 98.8 F (37.1 C) 97.9 F (36.6 C) 98 F (36.7 C)  TempSrc: Oral Oral Oral   SpO2: 100% 97% 96% 97%  Weight:      Height:       Physical Exam HENT:     Head: Normocephalic.  Eyes:     General: Lids are normal.  Cardiovascular:     Rate and Rhythm: Normal rate and regular rhythm.     Heart sounds: Normal heart sounds, S1 normal and S2 normal.  Pulmonary:     Breath sounds: No decreased breath sounds, wheezing, rhonchi or rales.  Abdominal:     Palpations: Abdomen is soft.     Tenderness: There is no abdominal tenderness.  Musculoskeletal:     Right lower leg: No swelling.     Left lower leg: No swelling.  Skin:    General: Skin is warm.     Findings: No rash.  Neurological:     Mental Status: She is alert.     Comments: Barely able to straight leg raise  Data Reviewed: Creatinine 2.12 previous creatinine a few months ago 1.6  Family Communication: spoke with son on the phone  Disposition: Status is: Observation Continue gentle intravenous fluid hydration to try to get creatinine as good as possible.  Recent of urine analysis.  Continue antibiotics for now.  Planned Discharge Destination: Rehab    Time spent: 28 minutes  Author: Charlie Patterson, MD 01/31/2024 2:45 PM  For on call review [email or web address].

## 2024-01-31 NOTE — Assessment & Plan Note (Addendum)
 Repeat urine analysis did not reflex to culture secondary to a large amount of squamous cells.  Empirically was placed on Rocephin 

## 2024-01-31 NOTE — Plan of Care (Signed)
  Problem: Education: Goal: Knowledge of General Education information will improve Description: Including pain rating scale, medication(s)/side effects and non-pharmacologic comfort measures Outcome: Progressing   Problem: Clinical Measurements: Goal: Ability to maintain clinical measurements within normal limits will improve Outcome: Progressing Goal: Will remain free from infection Outcome: Progressing Goal: Diagnostic test results will improve Outcome: Progressing Goal: Respiratory complications will improve Outcome: Progressing Goal: Cardiovascular complication will be avoided Outcome: Progressing   Problem: Nutrition: Goal: Adequate nutrition will be maintained Outcome: Progressing   Problem: Coping: Goal: Level of anxiety will decrease Outcome: Progressing   Problem: Elimination: Goal: Will not experience complications related to bowel motility Outcome: Progressing Goal: Will not experience complications related to urinary retention Outcome: Progressing   Problem: Safety: Goal: Ability to remain free from injury will improve Outcome: Progressing   Problem: Skin Integrity: Goal: Risk for impaired skin integrity will decrease Outcome: Progressing   

## 2024-01-31 NOTE — Assessment & Plan Note (Addendum)
 Seems improved.  Continue to monitor.  Will get a CT scan of the head since not done on admission.

## 2024-01-31 NOTE — TOC Initial Note (Signed)
 Transition of Care [facility]) - Initial/Assessment Note    Patient Details  Name: Stephanie Anthony MRN: [id number] Date of Birth: 10-05-1933  [facility]) CM/SW Contact:    Victory Jackquline RAMAN, RN Phone Number: 01/31/2024, 3:48 PM  Clinical Narrative:  Chart Reviewed. RNCM spoke to patient at bedside introduced myself and roll and that discharge planning recommendations would be discussed, she advised me to speak to her son secondary to being so tired. Spoke to son Stephanie Anthony @ [phone] and he is agreeable to SNF placement and would like for his mother to be submitted for placement.He doesn't have a preference. I explained to him about the moon letter and his mom's observation status. He verbalized understanding. I left a copy of the Moon Letter at the bedside. RNCM will continue to follow for Discharge Planning and Care Coordination and will update as applicable.          Expected Discharge Plan: Skilled Nursing Facility Barriers to Discharge: Continued Medical Work up   Patient Goals and CMS Choice            Expected Discharge Plan and Services     Post Acute Care Choice: Skilled Nursing Facility Living arrangements for the past 2 months: Single Family Home                                      Prior Living Arrangements/Services Living arrangements for the past 2 months: Single Family Home Lives with:: Self Patient language and need for interpreter reviewed:: Yes Do you feel safe going back to the place where you live?: Yes      Need for Family Participation in Patient Care: Yes (Comment) Care giver support system in place?: Yes (comment)   Criminal Activity/Legal Involvement Pertinent to Current Situation/Hospitalization: No - Comment as needed  Activities of Daily Living   ADL Screening (condition at time of admission) Independently performs ADLs?: Yes (appropriate for developmental age) Is the patient deaf or have difficulty hearing?: No Does the  patient have difficulty seeing, even when wearing glasses/contacts?: No Does the patient have difficulty concentrating, remembering, or making decisions?: No  Permission Sought/Granted                  Emotional Assessment Appearance:: Well-Groomed, Appears stated age Attitude/Demeanor/Rapport: Gracious, Lethargic Affect (typically observed): Accepting, Quiet, Pleasant Orientation: : Oriented to Self, Oriented to Place, Oriented to  Time, Oriented to Situation   Psych Involvement: No (comment)  Admission diagnosis:  UTI (urinary tract infection) [N39.0] Acute cystitis without hematuria [N30.00] Generalized weakness [R53.1] Patient Active Problem List   Diagnosis Date Noted   Suspected UTI 01/31/2024   Acute metabolic encephalopathy 01/31/2024   Weakness 01/30/2024   Peripheral edema 11/01/2023   Hallucinations 10/28/2023   Bipolar disorder (HCC) 10/28/2023   History of total knee arthroplasty, left 06/10/2023   Primary osteoarthritis of left knee 04/05/2023   History of glaucoma 08/05/2022   Seasonal allergies 07/31/2021   BPPV (benign paroxysmal positional vertigo), left 02/05/2020   Near syncope 02/04/2020   Gastroesophageal reflux disease without esophagitis 10/27/2019   Resting tremor 09/26/2019   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   Proteinuria 02/25/2019   Mass of appendix 06/21/2018   Acute kidney injury superimposed on CKD (HCC) 07/11/2015   Anorexia 07/09/2015   Status post total right knee replacement 06/22/2015   Enteritis 10/17/2014   H/O nutritional disorder 09/27/2014   HLD (  hyperlipidemia) 09/27/2014   Heart valve incompetence 09/27/2014   Mitral and aortic incompetence 09/27/2014   Personal history of other endocrine, nutritional and metabolic disease 94/88/7983   Hypothyroidism, postop 09/27/2014   Hypothyroidism 03/29/2014   Bradycardia 03/29/2014   Hypertension 03/29/2014   MI (mitral incompetence) 03/29/2014   Acquired cyst of kidney  06/18/2012   PCP:  Alla Amis, MD Pharmacy:   [facility] [phone] - [place], [place] - [address] AT [facility] OF [address] [place] [address] Phone: [phone] Fax: [phone]     Social Drivers of Health (SDOH) Social History: SDOH Screenings   Food Insecurity: No Food Insecurity (01/30/2024)  Housing: Low Risk  (01/30/2024)  Transportation Needs: No Transportation Needs (01/30/2024)  Utilities: Not At Risk (01/30/2024)  Alcohol Screen: Low Risk  (11/11/2023)  Depression (PHQ2-9): Low Risk  (06/01/2023)  Financial Resource Strain: Low Risk  (12/10/2023)   Received from [facility]  Social Connections: Unknown (01/30/2024)  Recent Concern: Social Connections - Socially Isolated (11/11/2023)  Tobacco Use: Medium Risk (01/30/2024)   SDOH Interventions:     Readmission Risk Interventions     No data to display

## 2024-01-31 NOTE — Evaluation (Signed)
 Physical Therapy Evaluation Patient Details Name: Stephanie Anthony MRN: [id number] DOB: September 06, 1933 Today's Date: 01/31/2024  History of Present Illness  presented to ER secondary to AMS, generalized weakness; admitted for management of UTI.  Clinical Impression  Patient seated edge of bed with OT upon arrival to room; alert and oriented, follows simple commands. Notably fatigued, requiring min encouragement for continued participation with session.  Denies pain, but does endorse generalized weakness and fatigue throughout.  Currently requiring min assist for sit/stand, standing balance and bed/chair transfer with RW.  Displays shuffling stepping pattern; generally unsteady and reliant on RW, +1 for optimal safety with mobility tasks.  Patient declined additional gait efforts due to overwhelming fatigue; will continue to assess/progress in subsequent sessions as appropriate. Of note, vitals stable and WFL with transition to upright; negative orthostatics. Would benefit from skilled PT to address above deficits and promote optimal return to PLOF.; recommend post-acute PT follow up as indicated by interdisciplinary care team.            If plan is discharge home, recommend the following: A little help with walking and/or transfers;A little help with bathing/dressing/bathroom   Can travel by private vehicle        Equipment Recommendations    Recommendations for Other Services       Functional Status Assessment Patient has had a recent decline in their functional status and demonstrates the ability to make significant improvements in function in a reasonable and predictable amount of time.     Precautions / Restrictions Precautions Precautions: Fall Restrictions Weight Bearing Restrictions Per Provider Order: No      Mobility  Bed Mobility Overal bed mobility: Needs Assistance Bed Mobility: Supine to Sit     Supine to sit: Supervision, Contact guard           Transfers Overall transfer level: Needs assistance Equipment used: Rolling walker (2 wheels) Transfers: Sit to/from Stand, Bed to chair/wheelchair/BSC Sit to Stand: Min assist Stand pivot transfers: Min assist         General transfer comment: short, shuffling steps; reliant on RW for external stabilization    Ambulation/Gait               General Gait Details: patient declined due to generalized fatigue  Stairs            Wheelchair Mobility     Tilt Bed    Modified Rankin (Stroke Patients Only)       Balance Overall balance assessment: Needs assistance Sitting-balance support: No upper extremity supported, Feet supported Sitting balance-Leahy Scale: Good     Standing balance support: Bilateral upper extremity supported Standing balance-Leahy Scale: Fair                               Pertinent Vitals/Pain Pain Assessment Pain Assessment: No/denies pain    Home Living Family/patient expects to be discharged to:: Private residence Living Arrangements: Alone Available Help at Discharge: Family;Available PRN/intermittently Type of Home: House Home Access: Stairs to enter Entrance Stairs-Rails: Right;Left;Can reach both Entrance Stairs-Number of Steps: 4   Home Layout: One level Home Equipment: Other (comment);Rolling Walker (2 wheels)      Prior Function               Mobility Comments: stated she uses her RW because he sons wants her too, does not like using the cane ADLs Comments: Pt stated that her son comes to town every  2 weeks to assist with IADLs (groceries, errands) able to perform ADLs modI/I.     Extremity/Trunk Assessment   Upper Extremity Assessment Upper Extremity Assessment: Generalized weakness    Lower Extremity Assessment Lower Extremity Assessment: Generalized weakness (grossly at leaste 4-/5 throughout; no focal weakness appreciated)       Communication   Communication Communication: No  apparent difficulties    Cognition Arousal: Lethargic Behavior During Therapy: Flat affect   PT - Cognitive impairments: No family/caregiver present to determine baseline                       PT - Cognition Comments: Oriented to self, location, general situation; follows simple commands (with increased encouragement) Following commands: Intact       Cueing Cueing Techniques: Verbal cues, Gestural cues, Tactile cues     General Comments      Exercises     Assessment/Plan    PT Assessment Patient needs continued PT services  PT Problem List Decreased activity tolerance;Decreased balance;Decreased mobility;Decreased knowledge of use of DME;Decreased safety awareness;Decreased knowledge of precautions       PT Treatment Interventions DME instruction;Gait training;Stair training;Patient/family education;Functional mobility training;Therapeutic activities;Therapeutic exercise;Balance training;Cognitive remediation    PT Goals (Current goals can be found in the Care Plan section)  Acute Rehab PT Goals Patient Stated Goal: to get my strength back PT Goal Formulation: With patient Time For Goal Achievement: 02/14/24 Potential to Achieve Goals: Good    Frequency Min 2X/week     Co-evaluation               AM-PAC PT 6 Clicks Mobility  Outcome Measure Help needed turning from your back to your side while in a flat bed without using bedrails?: None Help needed moving from lying on your back to sitting on the side of a flat bed without using bedrails?: A Little Help needed moving to and from a bed to a chair (including a wheelchair)?: A Little Help needed standing up from a chair using your arms (e.g., wheelchair or bedside chair)?: A Little Help needed to walk in hospital room?: A Little Help needed climbing 3-5 steps with a railing? : A Little 6 Click Score: 19    End of Session   Activity Tolerance: Patient tolerated treatment well Patient left: in  chair;with call bell/phone within reach;with chair alarm set Nurse Communication: Mobility status PT Visit Diagnosis: Muscle weakness (generalized) (M62.81);Difficulty in walking, not elsewhere classified (R26.2)    Time: 9089-9066 PT Time Calculation (min) (ACUTE ONLY): 23 min   Charges:   PT Evaluation $PT Eval Low Complexity: 1 Low   PT General Charges $$ ACUTE PT VISIT: 1 Visit        Tyanna Hach H. Delores, PT, DPT, NCS 01/31/24, 9:57 AM [phone]

## 2024-01-31 NOTE — Evaluation (Signed)
 Occupational Therapy Evaluation Patient Details Name: Stephanie Anthony MRN: [id number] DOB: [birth year] Today's Date: 01/31/2024   History of Present Illness   presented to ER secondary to AMS, generalized weakness; admitted for management of UTI.     Clinical Impressions Stephanie Anthony was seen for OT evaluation this date. Prior to hospital admission, pt was IND. Pt lives alone with son checking in every 2 weeks for IADL assist. Pt currently requires CGA + RW for ADL t/f ~20 ft. MIN A simulated UB dressing with no UE support, assist for balance. SUPERVISION for LB access in sitting. Fatigues and requests return to bed, left with PT in room. Pt would benefit from skilled OT to address noted impairments and functional limitations (see below for any additional details). Upon hospital discharge, recommend OT follow up <3 hours/day - pt may progress to home or can d/c safely with daily assistance.     If plan is discharge home, recommend the following:   A little help with walking and/or transfers;A little help with bathing/dressing/bathroom     Functional Status Assessment   Patient has had a recent decline in their functional status and demonstrates the ability to make significant improvements in function in a reasonable and predictable amount of time.     Equipment Recommendations   BSC/3in1     Recommendations for Other Services         Precautions/Restrictions   Precautions Precautions: Fall Recall of Precautions/Restrictions: Intact Restrictions Weight Bearing Restrictions Per Provider Order: No     Mobility Bed Mobility Overal bed mobility: Modified Independent                  Transfers Overall transfer level: Needs assistance Equipment used: Rolling walker (2 wheels) Transfers: Sit to/from Stand Sit to Stand: Contact guard assist                  Balance Overall balance assessment: Needs assistance Sitting-balance support: No upper extremity  supported, Feet supported Sitting balance-Leahy Scale: Good     Standing balance support: During functional activity, No upper extremity supported Standing balance-Leahy Scale: Poor                             ADL either performed or assessed with clinical judgement   ADL Overall ADL's : Needs assistance/impaired                                       General ADL Comments: CGA + RW for ADL t/f ~20 ft. MIN A simulated UB dressing with no UE support, assist for balance. SUPERVISION for LB access in sitting      Pertinent Vitals/Pain Pain Assessment Pain Assessment: No/denies pain     Extremity/Trunk Assessment Upper Extremity Assessment Upper Extremity Assessment: Generalized weakness   Lower Extremity Assessment Lower Extremity Assessment: Generalized weakness       Communication Communication Communication: Impaired Factors Affecting Communication: Hearing impaired   Cognition Arousal: Alert Behavior During Therapy: Flat affect Cognition: History of cognitive impairments                               Following commands: Intact       Cueing  General Comments          Exercises     Shoulder Instructions  Home Living Family/patient expects to be discharged to:: Private residence Living Arrangements: Alone Available Help at Discharge: Family;Available PRN/intermittently Type of Home: House Home Access: Stairs to enter [employer] of Steps: 4 Entrance Stairs-Rails: Right;Left Home Layout: One level               Home Equipment: Agricultural consultant (2 wheels)          Prior Functioning/Environment               Mobility Comments: stated she uses her RW because he sons wants her too, does not like using the cane ADLs Comments: Pt stated that her son comes to town every 2 weeks to assist with IADLs (groceries, errands) able to perform ADLs modI/I.    OT Problem List: Decreased  strength;Decreased range of motion;Decreased activity tolerance;Impaired balance (sitting and/or standing)   OT Treatment/Interventions: Self-care/ADL training;Therapeutic exercise;Energy conservation;DME and/or AE instruction;Therapeutic activities;Patient/family education      OT Goals(Current goals can be found in the care plan section)   Acute Rehab OT Goals Patient Stated Goal: to feel stronger OT Goal Formulation: With patient Time For Goal Achievement: 02/14/24 Potential to Achieve Goals: Good ADL Goals Pt Will Perform Grooming: with modified independence;standing Pt Will Perform Lower Body Dressing: with modified independence;sit to/from stand Pt Will Transfer to Toilet: with modified independence;ambulating;regular height toilet   OT Frequency:  Min 3X/week    Co-evaluation              AM-PAC OT 6 Clicks Daily Activity     Outcome Measure Help from another person eating meals?: None Help from another person taking care of personal grooming?: A Little Help from another person toileting, which includes using toliet, bedpan, or urinal?: A Little Help from another person bathing (including washing, rinsing, drying)?: A Little Help from another person to put on and taking off regular upper body clothing?: A Little Help from another person to put on and taking off regular lower body clothing?: A Little 6 Click Score: 19   End of Session Equipment Utilized During Treatment: Gait belt;Rolling walker (2 wheels)  Activity Tolerance: Patient tolerated treatment well Patient left: in bed (PT in room)  OT Visit Diagnosis: Other abnormalities of gait and mobility (R26.89);Muscle weakness (generalized) (M62.81)                Time: 9089-9076 OT Time Calculation (min): 13 min Charges:  OT General Charges $OT Visit: 1 Visit OT Evaluation $OT Eval Low Complexity: 1 Low  Stephanie Anthony, M.S. OTR/L  01/31/24, 10:17 AM  ascom [phone]

## 2024-02-01 DIAGNOSIS — F3132 Bipolar disorder, current episode depressed, moderate: Secondary | ICD-10-CM

## 2024-02-01 DIAGNOSIS — R3989 Other symptoms and signs involving the genitourinary system: Secondary | ICD-10-CM | POA: Diagnosis not present

## 2024-02-01 DIAGNOSIS — N179 Acute kidney failure, unspecified: Secondary | ICD-10-CM | POA: Diagnosis not present

## 2024-02-01 DIAGNOSIS — R531 Weakness: Secondary | ICD-10-CM | POA: Diagnosis not present

## 2024-02-01 LAB — BASIC METABOLIC PANEL WITH GFR
Anion gap: 15 (ref 5–15)
BUN: 33 mg/dL — ABNORMAL HIGH (ref 8–23)
CO2: 21 mmol/L — ABNORMAL LOW (ref 22–32)
Calcium: 9.1 mg/dL (ref 8.9–10.3)
Chloride: 109 mmol/L (ref 98–111)
Creatinine, Ser: 1.92 mg/dL — ABNORMAL HIGH (ref 0.44–1.00)
GFR, Estimated: 25 mL/min — ABNORMAL LOW (ref >60)
Glucose, Bld: 102 mg/dL — ABNORMAL HIGH (ref 70–99)
Potassium: 4.2 mmol/L (ref 3.5–5.1)
Sodium: 145 mmol/L (ref 135–145)

## 2024-02-01 LAB — FOLATE RBC
Folate, Hemolysate: 405 ng/mL
Folate, RBC: 1125 ng/mL (ref >498)
Hematocrit: 36 % (ref 34.0–46.6)

## 2024-02-01 NOTE — Consult Note (Signed)
 WOC Nurse Consult Note: Reason for Consult: Consult requested for bilat legs.  This was already performed on 9/13; please refer to previous WOC consult note for assessment and plan of care, and topical treatment orders have been provided for bedside nurses to perform as follows:   Bedside nurse, please change BLE dressings Q day as follows: Cleanse B lower extremity wounds with NS, dry and apply Xeroform gauze (Lawson [id number]) to wound beds daily, cover with ABD pads and secure with Kerlix roll gauze beginning right above toes and ending right below knees.  Apply Ace bandage wrapped in same fashion as Kerlix for light compression.  Please re-consult if further assistance is needed.  Thank-you,  Stephane Fought MSN, RN, CWOCN, CWCN-AP, CNS Contact Mon-Fri 0700-1500: [phone]

## 2024-02-01 NOTE — TOC PASRR Note (Signed)
     RE:     Date of Birth:   ___________  Date:        To Whom It May Concern:  Please be advised that the above-named patient will require a short-term nursing home stay - anticipated 30 days or less for rehabilitation and strengthening.  The plan is for return home.     

## 2024-02-01 NOTE — TOC Progression Note (Signed)
 Transition of Care [facility]) - Progression Note    Patient Details  Name: Shruti Arrey MRN: [id number] Date of Birth: [birth year]  Transition of Care [facility]) CM/SW Contact  Dalia GORMAN Fuse, RN Phone Number: 02/01/2024, 3:54 PM  Clinical Narrative:     JANAS sent out in [facility] and requested documents uploaded for PASRR review. Bed offers and PASRR is pending.  Expected Discharge Plan: Skilled Nursing Facility Barriers to Discharge: Continued Medical Work up               Expected Discharge Plan and Services     Post Acute Care Choice: Skilled Nursing Facility Living arrangements for the past 2 months: Single Family Home                                       Social Drivers of Health (SDOH) Interventions SDOH Screenings   Food Insecurity: No Food Insecurity (01/30/2024)  Housing: Low Risk  (01/30/2024)  Transportation Needs: No Transportation Needs (01/30/2024)  Utilities: Not At Risk (01/30/2024)  Alcohol Screen: Low Risk  (11/11/2023)  Depression (PHQ2-9): Low Risk  (06/01/2023)  Financial Resource Strain: Low Risk  (12/10/2023)   Received from [facility]  Social Connections: Unknown (01/30/2024)  Recent Concern: Social Connections - Socially Isolated (11/11/2023)  Tobacco Use: Medium Risk (01/30/2024)    Readmission Risk Interventions     No data to display

## 2024-02-01 NOTE — Progress Notes (Signed)
 Progress Note   Patient: Stephanie Anthony FMW:[id number] DOB: [birth year] DOA: 01/30/2024     0 DOS: the patient was seen and examined on 02/01/2024   Brief hospital course: 89 y.o. female with medical history significant of bipolar disorder, hypothyroidism, CKD stage IV, HTN, HLD, GERD, recent UTI who presents with generalized weakness from home.  She was brought in approximately 1 week ago and found to have a UTI at that time.  She is taking all her meds as directed but has been unable to get up and move today.  She reports poor appetite and little desire to eat over the last few days.  Of note the patient has history of lower extremity edema that has required home wound care to bilateral lower extremities.  Additionally the patient was admitted to geropsych in June of this year.  A lot of the history was obtained from the son who stays with her approximately 4 days a week.  He is unsure if this is psychiatric or if something else is going on.  He called EMS because he was unable to get her out of bed today.  In the ED she was noted to have large hemoglobin protein and large leukocytes in the urine consistent with possible UTI.  She has a stable creatinine at 2.12.   9/14.  PT recommending rehab 9/15.  Cr 1.92.  Will stop fluids.  Repeat urinalysis did not reflex to culture.    Assessment and Plan: * Acute kidney injury superimposed on CKD (HCC) Acute kidney injury on CKD stage IV.  Creatinine 2.12 on presentation and down to 1.92 with gentle fluids.  Looking back creatinine was 1.6 in June.  Hold on further fluids  Suspected UTI Repeat urine analysis did not reflex to culture secondary to a large amount of squamous cells.  Empirically was placed on Rocephin   Bipolar disorder (HCC) Continue Lamictal  and lower the dose of Seroquel  to 50 mg at night with increased creatinine.  Consulted psychiatry because patient without motivation with working with physical therapy.  Wondering if this is  depression because her daughter recently died.  Weakness Generalized weakness.  Physical therapy recommending rehab.  Peripheral edema Bilateral lower extremity wraps  Acute metabolic encephalopathy Seems improved.  Continue to monitor.  Gastroesophageal reflux disease without esophagitis Continue PPI  HLD (hyperlipidemia) Will give a statin holiday with weakness  Hypertension Continue losartan   Hypothyroidism TSH 1.47.  Continue levothyroxine         Subjective: patient with a lot of vague complaints.  Does not feel well but can't elaborate much. Admitted with weakness and possible uti.  Physical Exam: Vitals:   01/31/24 1652 01/31/24 2101 02/01/24 0409 02/01/24 0856  BP: (!) 115/41 (!) 139/57 (!) 158/64 131/62  Pulse: 67 79 80 67  Resp: 18 16  18   Temp: 97.8 F (36.6 C) 98.6 F (37 C) 97.7 F (36.5 C) 98.3 F (36.8 C)  TempSrc:    Oral  SpO2: 99% 96% 98% 97%  Weight:      Height:       Physical Exam HENT:     Head: Normocephalic.  Eyes:     General: Lids are normal.  Cardiovascular:     Rate and Rhythm: Normal rate and regular rhythm.     Heart sounds: Normal heart sounds, S1 normal and S2 normal.  Pulmonary:     Breath sounds: No decreased breath sounds, wheezing, rhonchi or rales.  Abdominal:     Palpations: Abdomen is soft.  Tenderness: There is no abdominal tenderness.  Musculoskeletal:     Right lower leg: No swelling.     Left lower leg: No swelling.  Skin:    General: Skin is warm.     Findings: No rash.  Neurological:     Mental Status: She is alert.     Comments: Barely able to straight leg raise     Data Reviewed:   Family Communication: spoke with son  Disposition: Status is: Observation TOC looking into rehab beds.  Psychiatry consultation in case this is depression causing her low motivation.  Planned Discharge Destination: Rehab    Time spent: 28 minutes  Author: Charlie Patterson, MD 02/01/2024 2:16 PM  For on  call review [email or web address].

## 2024-02-01 NOTE — Progress Notes (Signed)
 Physical Therapy Treatment Patient Details Name: Stephanie Anthony MRN: [id number] DOB: June 17, 1933 Today's Date: 02/01/2024   History of Present Illness presented to ER secondary to AMS, generalized weakness; admitted for management of UTI.    PT Comments  Pt agrees to session.  She is able to get to EOB with general ease.  Stands with min a x 1 mostly for hand placement cues and light balance assist.  She is able to march in place and walk about 7' before c/o fatigue.  Short rest in chair before walking to recliner in room.  Stated she did not feel she could do more at this time.  Son in room.  Stated she is having increasing difficulty at home especially since her daughter passed about a month ago.  Does not use AD at baseline and only walks short household distances.  Generally flat affect.  Stated he does feel she may be a bit depressed and it could be affecting her mobility and general motivation for tasks.  Will discuss with MD.  <3 hrs a day therapy remains appropriate upon discharge.   If plan is discharge home, recommend the following: A little help with walking and/or transfers;A little help with bathing/dressing/bathroom;Assist for transportation;Help with stairs or ramp for entrance;Assistance with cooking/housework   Can travel by Training and development officer (2 wheels);BSC/3in1    Recommendations for Other Services       Precautions / Restrictions Precautions Precautions: Fall Recall of Precautions/Restrictions: Intact Restrictions Weight Bearing Restrictions Per Provider Order: No     Mobility  Bed Mobility Overal bed mobility: Modified Independent               Patient Response: Cooperative, Flat affect  Transfers Overall transfer level: Needs assistance Equipment used: Rolling walker (2 wheels) Transfers: Sit to/from Stand Sit to Stand: Contact guard assist, Min assist           General transfer comment: cues  for hand placements    Ambulation/Gait Ambulation/Gait assistance: Contact guard assist, Min assist Gait Distance (Feet): 10 Feet Assistive device: Rolling walker (2 wheels) Gait Pattern/deviations: Step-through pattern, Decreased step length - right, Decreased step length - left Gait velocity: dec     General Gait Details: short gait distances limited by fatigue   Stairs             Wheelchair Mobility     Tilt Bed Tilt Bed Patient Response: Cooperative, Flat affect  Modified Rankin (Stroke Patients Only)       Balance Overall balance assessment: Needs assistance Sitting-balance support: No upper extremity supported, Feet supported Sitting balance-Leahy Scale: Good     Standing balance support: During functional activity, No upper extremity supported Standing balance-Leahy Scale: Poor                              Communication Communication Communication: Impaired Factors Affecting Communication: Hearing impaired  Cognition Arousal: Alert Behavior During Therapy: Flat affect   PT - Cognitive impairments: No apparent impairments                         Following commands: Intact      Cueing Cueing Techniques: Verbal cues, Gestural cues, Tactile cues  Exercises      General Comments        Pertinent Vitals/Pain Pain Assessment Pain Assessment: No/denies pain  Home Living                          Prior Function            PT Goals (current goals can now be found in the care plan section) Progress towards PT goals: Progressing toward goals    Frequency    Min 2X/week      PT Plan      Co-evaluation              AM-PAC PT 6 Clicks Mobility   Outcome Measure  Help needed turning from your back to your side while in a flat bed without using bedrails?: None Help needed moving from lying on your back to sitting on the side of a flat bed without using bedrails?: None Help needed moving to  and from a bed to a chair (including a wheelchair)?: A Little Help needed standing up from a chair using your arms (e.g., wheelchair or bedside chair)?: A Little Help needed to walk in hospital room?: A Little Help needed climbing 3-5 steps with a railing? : A Little 6 Click Score: 20    End of Session Equipment Utilized During Treatment: Gait belt Activity Tolerance: Patient tolerated treatment well Patient left: in chair;with call bell/phone within reach;with chair alarm set Nurse Communication: Mobility status PT Visit Diagnosis: Muscle weakness (generalized) (M62.81);Difficulty in walking, not elsewhere classified (R26.2)     Time: 8944-8878 PT Time Calculation (min) (ACUTE ONLY): 26 min  Charges:    $Gait Training: 8-22 mins $Therapeutic Activity: 8-22 mins PT General Charges $$ ACUTE PT VISIT: 1 Visit                   Lauraine Gills, PTA 02/01/24, 11:51 AM

## 2024-02-01 NOTE — NC FL2 (Signed)
 [place]  MEDICAID [id number] LEVEL OF CARE FORM     IDENTIFICATION  Patient Name: Stephanie Anthony Birthdate: 12-31-1933 Sex: female Admission Date (Current Location): 01/30/2024  [facility] and [place] Number:  Chiropodist and Address:  [facility], [address] [place], [place] [address]      Provider Number: [id number]  Attending Physician Name and Address:  Josette Ade, MD  Relative Name and Phone Number:  Tabria, Steines (Son)  [phone] (Home Phone)    Current Level of Care: Hospital Recommended Level of Care: Skilled Nursing Facility Prior Approval Number:    Date Approved/Denied:   PASRR Number:    Discharge Plan: SNF    Current Diagnoses: Patient Active Problem List   Diagnosis Date Noted   Suspected UTI 01/31/2024   Acute metabolic encephalopathy 01/31/2024   Weakness 01/30/2024   Peripheral edema 11/01/2023   Hallucinations 10/28/2023   Bipolar disorder (HCC) 10/28/2023   History of total knee arthroplasty, left 06/10/2023   Primary osteoarthritis of left knee 04/05/2023   History of glaucoma 08/05/2022   Seasonal allergies 07/31/2021   BPPV (benign paroxysmal positional vertigo), left 02/05/2020   Near syncope 02/04/2020   Gastroesophageal reflux disease without esophagitis 10/27/2019   Resting tremor 09/26/2019   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   Proteinuria 02/25/2019   Mass of appendix 06/21/2018   Acute kidney injury superimposed on CKD (HCC) 07/11/2015   Anorexia 07/09/2015   Status post total right knee replacement 06/22/2015   Enteritis 10/17/2014   H/O nutritional disorder 09/27/2014   HLD (hyperlipidemia) 09/27/2014   Heart valve incompetence 09/27/2014   Mitral and aortic incompetence 09/27/2014   Personal history of other endocrine, nutritional and metabolic disease 94/88/7983   Hypothyroidism, postop 09/27/2014   Hypothyroidism 03/29/2014   Bradycardia 03/29/2014   Hypertension 03/29/2014    MI (mitral incompetence) 03/29/2014   Acquired cyst of kidney 06/18/2012    Orientation RESPIRATION BLADDER Height & Weight     Self, Place, Time, Situation  Normal Continent Weight: 91.6 kg Height:  5' 7 (170.2 cm)  BEHAVIORAL SYMPTOMS/MOOD NEUROLOGICAL BOWEL NUTRITION STATUS      Continent Diet  AMBULATORY STATUS COMMUNICATION OF NEEDS Skin   Limited Assist Verbally Normal                       Personal Care Assistance Level of Assistance  Bathing, Feeding, Dressing Bathing Assistance: Limited assistance Feeding assistance: Limited assistance Dressing Assistance: Limited assistance     Functional Limitations Info  Sight, Hearing, Speech Sight Info: Adequate Hearing Info: Adequate Speech Info: Adequate    SPECIAL CARE FACTORS FREQUENCY  PT (By licensed PT), OT (By licensed OT)     PT Frequency: 5x a week OT Frequency: 3x a week            Contractures Contractures Info: Not present    Additional Factors Info  Code Status, Allergies Code Status Info: FULL Allergies Info: Aspirin ,Prednisone,Codeine,Valium (diazepam)           Current Medications (02/01/2024):  This is the current hospital active medication list Current Facility-Administered Medications  Medication Dose Route Frequency Provider Last Rate Last Admin   acetaminophen  (TYLENOL ) tablet 650 mg  650 mg Oral Q6H PRN Pratt, Tanya S, MD   650 mg at 02/01/24 0845   Or   acetaminophen  (TYLENOL ) suppository 650 mg  650 mg Rectal Q6H PRN Pratt, Tanya S, MD       cefTRIAXone  (ROCEPHIN ) 2 g in  sodium chloride  0.9 % 100 mL IVPB  2 g Intravenous Q24H Pratt, Tanya S, MD 200 mL/hr at 02/01/24 0209 2 g at 02/01/24 0209   heparin  injection 5,000 Units  5,000 Units Subcutaneous Q8H Pratt, Tanya S, MD   5,000 Units at 02/01/24 1305   lamoTRIgine  (LAMICTAL ) tablet 100 mg  100 mg Oral Daily Pratt, Tanya S, MD   100 mg at 02/01/24 0840   latanoprost  (XALATAN ) 0.005 % ophthalmic solution 1 drop  1 drop Both Eyes QHS  Fredirick Glenys RAMAN, MD   1 drop at 01/31/24 2111   levothyroxine  (SYNTHROID ) tablet 112 mcg  112 mcg Oral Q0600 Pratt, Tanya S, MD   112 mcg at 02/01/24 0559   losartan  (COZAAR ) tablet 25 mg  25 mg Oral Daily Pratt, Tanya S, MD   25 mg at 02/01/24 0840   metoprolol  tartrate (LOPRESSOR ) injection 5 mg  5 mg Intravenous Q6H PRN Pratt, Tanya S, MD       pantoprazole  (PROTONIX ) EC tablet 40 mg  40 mg Oral Daily Pratt, Tanya S, MD   40 mg at 01/31/24 2110   polyethylene glycol (MIRALAX  / GLYCOLAX ) packet 17 g  17 g Oral Daily PRN Pratt, Tanya S, MD       QUEtiapine  (SEROQUEL ) tablet 50 mg  50 mg Oral QHS Josette Ade, MD   50 mg at 01/31/24 2110     Discharge Medications: Please see discharge summary for a list of discharge medications.  Relevant Imaging Results:  Relevant Lab Results:   Additional Information SS# [id number]  DOB 07/02/1933  Dalia RAMAN Fuse, RN

## 2024-02-01 NOTE — Assessment & Plan Note (Signed)
 Bilateral lower extremity wraps

## 2024-02-01 NOTE — Consult Note (Signed)
 [facility] Psychiatric Consult Initial  Patient Name: .Stephanie Anthony  MRN: [id number]  DOB: 05-26-1933  Consult Order details:  Orders (From admission, onward)     Start     Ordered   02/01/24 1205  IP CONSULT TO PSYCHIATRY       Comments: Dr Josette savoy  Ordering Provider: Josette Ade, MD  Provider:  (Not yet assigned)  Question Answer Comment  Location [facility]   Reason for Consult? bipolar depression      02/01/24 1204             Mode of Visit: In person    Psychiatry Consult Evaluation  Service Date: February 01, 2024 LOS:  LOS: 0 days  Chief Complaint I don't have anything to say with psychiatry right now  Primary Psychiatric Diagnoses  Uncomplicated grief   Assessment  Stephanie Anthony is a 89 y.o. female admitted: Medically   Consult placed by medical team due to This patient has hx bipolar disorder, has lots of vague complaints and not feeling well. her daughter passed away within around a month ago. I decreased her seroquel  dose last night with her kidney function being chronically impaired. OT noticed her very flat and unmotivated with therapy  Based on patient interview, chart review and assessment, patient presentation most consistent with uncomplicated grief. During assessment, the patient denied suicidal or homicidal thoughts. She denied auditory hallucinations and denied visual hallucinations.  Patient does not meet requirement for IVC or inpatient admission. Patient declined any medication changes, wishes to continue following up with outpatient provider Dr. Vickey. Patient agreeable to follow up with outpatient psychiatry for long term management. She reported having follow up appointment with her provider.   Diagnoses:  Active Hospital problems: Principal Problem:   Acute kidney injury superimposed on CKD (HCC) Active Problems:   Hypothyroidism   Hypertension   HLD (hyperlipidemia)   Gastroesophageal  reflux disease without esophagitis   Bipolar disorder (HCC)   Peripheral edema   Weakness   Suspected UTI   Acute metabolic encephalopathy    Plan   ## Psychiatric Medication Recommendations:  -Follow up with outpatient provider Dr. Vickey  -No new medication changes -Psych cleared at this time- does not meet IVC or inpatient criteria -Follow PT/OT recommendations for patient ability to care for self   ## Medical Decision Making Capacity: Not specifically addressed in this encounter  ## Further Work-up:   -- most recent EKG on 01/30/2024 had QtC of 426 -- Pertinent labwork reviewed earlier this admission includes: BMP, CBC, B12, TSH   ## Disposition:-- There are no psychiatric contraindications to discharge at this time  ## Behavioral / Environmental: -Utilize compassion and acknowledge the patient's experiences while setting clear and realistic expectations for care.    ## Safety and Observation Level:  - Based on my clinical evaluation, I estimate the patient to be at low risk of self harm in the current setting. - At this time, we recommend  routine. This decision is based on my review of the chart including patient's history and current presentation, interview of the patient, mental status examination, and consideration of suicide risk including evaluating suicidal ideation, plan, intent, suicidal or self-harm behaviors, risk factors, and protective factors. This judgment is based on our ability to directly address suicide risk, implement suicide prevention strategies, and develop a safety plan while the patient is in the clinical setting. Please contact our team if there is a concern that risk level has changed.  CSSR Risk Category:C-SSRS RISK CATEGORY: Moderate Risk- chronically elevated due to previous suicide attempt years ago. Patient reported it's been so long ago, I can't even remember which year it was. Chart review performed, no recent attempts noted in patient chart  review  Suicide Risk Assessment: Patient has following modifiable risk factors for suicide: triggering events, which we are addressing by recommending continued follow up with outpatient provider and recommended CBT through outpatient provider. Patient has following non-modifiable or demographic risk factors for suicide: history of suicide attempt and psychiatric hospitalization Patient has the following protective factors against suicide: Access to outpatient mental health care and Supportive family  Thank you for this consult request. Recommendations have been communicated to the primary team.  We will sign off at this time.   Stephanie KATHEE Sharps, NP       History of Present Illness  Relevant Aspects of [facility]   Patient Report:  Patient is 89 year old female with past psychiatric documented history of Bipolar 1 disorder. Patient was noted to be sitting in recliner eating lunch and appeared fatigued. On exam today, patient reported I don't have any thing to say with psychiatry right now. Patient denied suicidal or homicidal thoughts as well as auditory or visual hallucinations. Patient reported her daughter died on Aug 22, 2025of this year. She stated It's just been really sad you know, she was really sick. She reported good support from her son, but he does live out of state.  She reported her appetite has been decreased due to her being physically ill. She reported sleeping some, but reported it has been hard while in the hospital because of having to wake up for blood work in the middle of the night. Patient reported she has been following up regularly with her outpatient psychiatrist Dr. Vickey, every 3-4 months. She denied currently speaking with a therapist, but reported she is open to it. She reported she feels satisfied with her every 3-4 month visits and is doing well with her current provider and medication regimen. She denied wanting to make any changes to anything currently.    She reported being sad over her daughter and also being physically ill recently. She reported she just feels weak physically. She denied feelings of hopelessness or worthlessness. She reported worrying about her situation, but knows that's normal. She denied symptoms of mania including racing thoughts, grandiose delusions, having to much energy or excessive spending. She reported typically her family reported she seems hyper when going through mania. She denied any episodes of this and family denied seeing any symptoms of mania as well. She denied any recent nightmares or flashbacks. She did endorse having a panic attack when all of this initially occurred, but denied any since. She also denied any alcohol or drug use.    Psych ROS:  Depression: Endorsed feelings of sadness related to daughter's death Anxiety:  Endorsed panic attack, but denied any recent occurance Mania (lifetime and current): Hospitalization on 6/10 of 2025 Psychosis: (lifetime and current): See above- for hallucinations   Collateral information:  Contacted [name] on 02/01/2024. Number is listed in patient chart. Per [facility], he denied any current safety concerns psychiatrically and reported he feels like patient is just processing her grief. He reported patient has been stable on her current medication regimen and agrees with not changing anything. He stated patient has been following up regularly with her outpatient provider and doing well.He is very supportive of patient and working to help plan next steps for  patient post-discharge. [name] informed to update psych or medical team with any new information or concerns if they arise.     Psychiatric and Social History  Psychiatric History:  Information collected from Patient and son  Prev Dx/Sx: Bipolar disorder Current Psych Provider: Dr. Vickey Home Meds (current): Lamictal , Seroquel  Therapy: Denied current, but open to it.  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  Legal Hx: Denied Living Situation: Currently lives independently, but reported this may have to change after recent medical issues   Access to weapons/lethal means: Denied   Substance History Alcohol: Denied  Type of alcohol Denied  Last Drink Denied  Number of drinks per day Denied  History of alcohol withdrawal seizures Denied  History of DT's Denied  Tobacco: Denied  Illicit drugs: Denied  Prescription drug abuse: Denied  Rehab hx: Denied   Exam Findings  Physical Exam: See medical team notes Vital Signs:  Temp:  [97.7 F (36.5 C)-98.6 F (37 C)] 98.3 F (36.8 C) (09/15 0856) Pulse Rate:  [67-80] 67 (09/15 0856) Resp:  [16-18] 18 (09/15 0856) BP: (115-158)/(41-64) 131/62 (09/15 0856) SpO2:  [96 %-99 %] 97 % (09/15 0856) Blood pressure 131/62, pulse 67, temperature 98.3 F (36.8 C), temperature source Oral, resp. rate 18, height 5' 7 (1.702 m), weight 91.6 kg, SpO2 97%. Body mass index is 31.64 kg/m.    Mental Status Exam: General Appearance: Fairly Groomed  Orientation:  Full (Time, Place, and Person)  Memory:  Immediate;   Good Recent;   Good Remote;   Good  Concentration:  Concentration: Fair and Attention Span: Fair  Recall:  Fair  Attention  Fair  Eye Contact:  Good  Speech:  Clear and Coherent and Normal Rate  Language:  Good  Volume:  Normal  Mood: Tired  Affect:  Appropriate  Thought Process:  Coherent and Goal Directed  Thought Content:  WDL  Suicidal Thoughts:  No  Homicidal Thoughts:  No  Judgement:  Fair  Insight:  Fair  Psychomotor Activity:  Normal  Akathisia:  No  Fund of Knowledge:  Good      Assets:  Communication  Skills Desire for Improvement Financial Resources/Insurance Housing Social Support  Cognition:  WNL  ADL's:  Impaired  AIMS (if indicated):        Other History   These have been pulled in through the EMR, reviewed, and updated if appropriate.  Family History:  The patient's family history includes Alcohol abuse in her father; Cancer in her brother and sister; Diverticulitis in her mother; Heart Problems in her mother; Heart attack in her father and sister; Pancreatitis in her sister; Seizures in her mother; Stroke in her mother.  Medical History: Past Medical History:  Diagnosis Date   Anemia    Anxiety    Arthritis    Bipolar 1 disorder (HCC)    Cataracts, bilateral    Chronic kidney disease    stage 3   Chronic kidney disease    Chronic mitral valve disease    COPD (chronic obstructive pulmonary disease) (HCC)    Coronary artery disease    Deviated nasal septum    Dyspnea    Erosive esophagitis    Erosive esophagitis  Esophageal stricture    Fibroid tumor    GERD (gastroesophageal reflux disease)    History of hiatal hernia    Hypertension    Hypothyroidism    Murmur, cardiac    Nephrogenic diabetes insipidus (HCC)    Pure hypercholesterolemia    Renal cyst, left    bilateral   Seasonal allergies     Surgical History: Past Surgical History:  Procedure Laterality Date   APPENDECTOMY     CARDIAC CATHETERIZATION     CHOLECYSTECTOMY     COLONOSCOPY WITH PROPOFOL  N/A 06/01/2018   Procedure: COLONOSCOPY WITH PROPOFOL ;  Surgeon: [name], MD;  Location[facility] ENDOSCOPY;  Service: Endoscopy;  Laterality: N/A;   ESOPHAGEAL DILATION     ESOPHAGOGASTRODUODENOSCOPY (EGD) WITH PROPOFOL  N/A 10/20/2014   Procedure: ESOPHAGOGASTRODUODENOSCOPY (EGD) WITH PROPOFOL ;  Surgeon: Gladis PENNER Gaylyn, MD;  Location: [facility] ENDOSCOPY;  Service: Endoscopy;  Laterality: N/A;   ESOPHAGOGASTRODUODENOSCOPY (EGD) WITH PROPOFOL  N/A 06/01/2018   Procedure:  ESOPHAGOGASTRODUODENOSCOPY (EGD) WITH PROPOFOL ;  Surgeon: [name], MD;  Location: [facility] ENDOSCOPY;  Service: Endoscopy;  Laterality: N/A;   HEMORRHOID SURGERY N/A 12/25/2023   Procedure: HEMORRHOIDECTOMY;  Surgeon: [name], MD;  Location: [facility] ORS;  Service: General;  Laterality: N/A;   INFECTED SKIN DEBRIDEMENT     on back several years ago   JOINT REPLACEMENT     KNEE ARTHROPLASTY Left 06/10/2023   Procedure: COMPUTER ASSISTED TOTAL KNEE ARTHROPLASTY;  Surgeon: Mardee Lynwood SQUIBB, MD;  Location: [facility] ORS;  Service: Orthopedics;  Laterality: Left;   knee replacement, right     LAPAROSCOPIC APPENDECTOMY N/A 06/21/2018   Procedure: APPENDECTOMY LAPAROSCOPIC;  Surgeon: [name], MD;  Location: [facility] ORS;  Service: General;  Laterality: N/A;   NOSE SURGERY     THYROIDECTOMY     TOTAL THYROIDECTOMY       Medications:   Current Facility-Administered Medications:    acetaminophen  (TYLENOL ) tablet 650 mg, 650 mg, Oral, Q6H PRN, 650 mg at 02/01/24 0845 **OR** acetaminophen  (TYLENOL ) suppository 650 mg, 650 mg, Rectal, Q6H PRN, Fredirick Glenys RAMAN, MD   cefTRIAXone  (ROCEPHIN ) 2 g in sodium chloride  0.9 % 100 mL IVPB, 2 g, Intravenous, Q24H, Fredirick Glenys RAMAN, MD, Last Rate: 200 mL/hr at 02/01/24 0209, 2 g at 02/01/24 0209   heparin  injection 5,000 Units, 5,000 Units, Subcutaneous, Q8H, Fredirick Glenys RAMAN, MD, 5,000 Units at 02/01/24 1305   lamoTRIgine  (LAMICTAL ) tablet 100 mg, 100 mg, Oral, Daily, Fredirick Glenys RAMAN, MD, 100 mg at 02/01/24 0840   latanoprost  (XALATAN ) 0.005 % ophthalmic solution 1 drop, 1 drop, Both Eyes, QHS, Fredirick Glenys RAMAN, MD, 1 drop at 01/31/24 2111   levothyroxine  (SYNTHROID ) tablet 112 mcg, 112 mcg, Oral, Q0600, Fredirick Glenys RAMAN, MD, 112 mcg at 02/01/24 0559   losartan  (COZAAR ) tablet 25 mg, 25 mg, Oral, Daily, Pratt, Tanya S, MD, 25 mg at 02/01/24 0840   metoprolol  tartrate (LOPRESSOR ) injection 5 mg, 5 mg, Intravenous, Q6H PRN, Fredirick Glenys RAMAN, MD    pantoprazole  (PROTONIX ) EC tablet 40 mg, 40 mg, Oral, Daily, Pratt, Tanya S, MD, 40 mg at 01/31/24 2110   polyethylene glycol (MIRALAX  / GLYCOLAX ) packet 17 g, 17 g, Oral, Daily PRN, Fredirick Glenys RAMAN, MD   QUEtiapine  (SEROQUEL ) tablet 50 mg, 50 mg, Oral, QHS, Josette Ade, MD, 50 mg at 01/31/24 2110  Allergies: Allergies  Allergen Reactions   Aspirin  Other (See Comments)    Rectal Bleeding   Prednisone Other (See Comments)    Hallucinations   Codeine Nausea  And Vomiting   Valium [Diazepam] Other (See Comments)    Uknown     Stephanie KATHEE Sharps, NP

## 2024-02-01 NOTE — Hospital Course (Signed)
 89 y.o. female with medical history significant of bipolar disorder, hypothyroidism, CKD stage IV, HTN, HLD, GERD, recent UTI who presents with generalized weakness from home.  She was brought in approximately 1 week ago and found to have a UTI at that time.  She is taking all her meds as directed but has been unable to get up and move today.  She reports poor appetite and little desire to eat over the last few days.  Of note the patient has history of lower extremity edema that has required home wound care to bilateral lower extremities.  Additionally the patient was admitted to geropsych in June of this year.  A lot of the history was obtained from the son who stays with her approximately 4 days a week.  He is unsure if this is psychiatric or if something else is going on.  He called EMS because he was unable to get her out of bed today.  In the ED she was noted to have large hemoglobin protein and large leukocytes in the urine consistent with possible UTI.  She has a stable creatinine at 2.12.   9/14.  PT recommending rehab 9/15.  Cr 1.92.  Will stop fluids.  Repeat urinalysis did not reflex to culture. 9/16.  Passr pending, rehab pending 9/17: Hemodynamically stable, creatinine continued to improve and now at 1.77, hemoglobin at 10.9, iron saturation mildly low with ferritin of 13. Apparently cannot go to SNF as she did not had 3 nights of inpatient stay.  Ordered maximum home health services.  9/18: Remained hemodynamically stable.  Unfortunately could not go to SNF because of her insurance requirements, maximum home health services were ordered, son is trying to get her to LTC which need to be done privately and as outpatient.  Patient will continue on current medications and need to have a close follow-up with her providers for further assistance.

## 2024-02-01 NOTE — Plan of Care (Signed)

## 2024-02-02 ENCOUNTER — Observation Stay

## 2024-02-02 DIAGNOSIS — F3131 Bipolar disorder, current episode depressed, mild: Secondary | ICD-10-CM

## 2024-02-02 DIAGNOSIS — E538 Deficiency of other specified B group vitamins: Secondary | ICD-10-CM | POA: Insufficient documentation

## 2024-02-02 DIAGNOSIS — R3989 Other symptoms and signs involving the genitourinary system: Secondary | ICD-10-CM | POA: Diagnosis not present

## 2024-02-02 DIAGNOSIS — R531 Weakness: Secondary | ICD-10-CM | POA: Diagnosis not present

## 2024-02-02 DIAGNOSIS — N179 Acute kidney failure, unspecified: Secondary | ICD-10-CM | POA: Diagnosis not present

## 2024-02-02 MED ORDER — CYANOCOBALAMIN 500 MCG PO TABS
500.0000 ug | ORAL_TABLET | Freq: Every day | ORAL | Status: DC
  Administered 2024-02-02 – 2024-02-04 (×3): 500 ug via ORAL
  Filled 2024-02-02 (×3): qty 1

## 2024-02-02 MED ORDER — CEPHALEXIN 250 MG PO CAPS
250.0000 mg | ORAL_CAPSULE | Freq: Three times a day (TID) | ORAL | Status: DC
Start: 2024-02-02 — End: 2024-02-04
  Administered 2024-02-02 – 2024-02-04 (×5): 250 mg via ORAL
  Filled 2024-02-02 (×6): qty 1

## 2024-02-02 MED ORDER — CEPHALEXIN 250 MG PO CAPS
250.0000 mg | ORAL_CAPSULE | Freq: Three times a day (TID) | ORAL | Status: DC
Start: 2024-02-02 — End: 2024-02-02

## 2024-02-02 NOTE — Assessment & Plan Note (Signed)
 Vitamin B12 on the lower side will give oral supplementation

## 2024-02-02 NOTE — Progress Notes (Signed)
 Progress Note   Patient: Stephanie Anthony FMW:[id number] DOB: Nov 01, 1933 DOA: 01/30/2024     0 DOS: the patient was seen and examined on 02/02/2024   Brief hospital course: 89 y.o. female with medical history significant of bipolar disorder, hypothyroidism, CKD stage IV, HTN, HLD, GERD, recent UTI who presents with generalized weakness from home.  She was brought in approximately 1 week ago and found to have a UTI at that time.  She is taking all her meds as directed but has been unable to get up and move today.  She reports poor appetite and little desire to eat over the last few days.  Of note the patient has history of lower extremity edema that has required home wound care to bilateral lower extremities.  Additionally the patient was admitted to geropsych in June of this year.  A lot of the history was obtained from the son who stays with her approximately 4 days a week.  He is unsure if this is psychiatric or if something else is going on.  He called EMS because he was unable to get her out of bed today.  In the ED she was noted to have large hemoglobin protein and large leukocytes in the urine consistent with possible UTI.  She has a stable creatinine at 2.12.   9/14.  PT recommending rehab 9/15.  Cr 1.92.  Will stop fluids.  Repeat urinalysis did not reflex to culture. 9/16.  Passr pending, rehab pending    Assessment and Plan: * Acute kidney injury superimposed on CKD (HCC) Acute kidney injury on CKD stage IV.  Creatinine 2.12 on presentation and down to 1.92 with gentle fluids.  Looking back creatinine was 1.6 in June.  Hold on further fluids.  Patient would not want dialysis.  Suspected UTI Repeat urine analysis did not reflex to culture secondary to a large amount of squamous cells.  Empirically was placed on Rocephin  and I will switch over to Keflex .  Bipolar disorder (HCC) Continue Lamictal  and lower the dose of Seroquel  to 50 mg at night with increased creatinine.  Seen by  psychiatry and did not recommend any other medication changes.  Weakness Generalized weakness.  Physical therapy recommending rehab.  Peripheral edema Bilateral lower extremity wraps  Vitamin B 12 deficiency Vitamin B12 on the lower side will give oral supplementation  Acute metabolic encephalopathy Seems improved.  Continue to monitor.  Will get a CT scan of the head since not done on admission.  Gastroesophageal reflux disease without esophagitis Continue PPI  HLD (hyperlipidemia) Will give a statin holiday with weakness  Hypertension Continue losartan   Hypothyroidism TSH 1.47.  Continue levothyroxine         Subjective: Patient does not feel well.  Does not elaborate much on what she is feeling.  Feels weak.  Prior to coming in and was living alone independent.  Admitted with generalized weakness and acute on chronic renal failure  Physical Exam: Vitals:   02/01/24 0856 02/01/24 1937 02/02/24 0414 02/02/24 0741  BP: 131/62 (!) 159/81 (!) 135/54 (!) 140/62  Pulse: 67 71 69 78  Resp: 18 18 18 19   Temp: 98.3 F (36.8 C) 98.1 F (36.7 C) 97.7 F (36.5 C) 97.9 F (36.6 C)  TempSrc: Oral  Oral Oral  SpO2: 97% 100% 98% 96%  Weight:      Height:       Physical Exam HENT:     Head: Normocephalic.  Eyes:     General: Lids are normal.  Cardiovascular:     Rate and Rhythm: Normal rate and regular rhythm.     Heart sounds: Normal heart sounds, S1 normal and S2 normal.  Pulmonary:     Breath sounds: No decreased breath sounds, wheezing, rhonchi or rales.  Abdominal:     Palpations: Abdomen is soft.     Tenderness: There is no abdominal tenderness.  Musculoskeletal:     Right lower leg: No swelling.     Left lower leg: No swelling.  Skin:    General: Skin is warm.     Findings: No rash.  Neurological:     Mental Status: She is alert.     Comments: Able to straight leg raise     Data Reviewed: Last creatinine 1.92 with a GFR of 25, last hemoglobin 10.2,  vitamin B12 288  Family Communication: Son at bedside  Disposition: Status is: Observation TOC waiting on pasARr and bed offers.  Will get CT scan of the head since not done on admission  Planned Discharge Destination: Skilled nursing facility    Time spent: 28 minutes  Author: Charlie Patterson, MD 02/02/2024 3:33 PM  For on call review [email or web address].

## 2024-02-02 NOTE — TOC Progression Note (Signed)
 Transition of Care [facility]) - Progression Note    Patient Details  Name: Stephanie Anthony MRN: [id number] Date of Birth: 23-Feb-1934  Transition of Care [facility] CM/SW Contact  Dalia GORMAN Fuse, RN Phone Number: 02/02/2024, 4:35 PM  Clinical Narrative:    Reviewed [place] Must, previous files with additional info did not attach. TOC reattached documents and submitted.   Expected Discharge Plan: Skilled Nursing Facility Barriers to Discharge: Continued Medical Work up               Expected Discharge Plan and Services     Post Acute Care Choice: Skilled Nursing Facility Living arrangements for the past 2 months: Single Family Home Expected Discharge Date: 02/02/24                                     Social Drivers of Health (SDOH) Interventions SDOH Screenings   Food Insecurity: No Food Insecurity (01/30/2024)  Housing: Low Risk  (01/30/2024)  Transportation Needs: No Transportation Needs (01/30/2024)  Utilities: Not At Risk (01/30/2024)  Alcohol Screen: Low Risk  (11/11/2023)  Depression (PHQ2-9): Low Risk  (06/01/2023)  Financial Resource Strain: Low Risk  (12/10/2023)   Received from [facility]  Social Connections: Unknown (01/30/2024)  Recent Concern: Social Connections - Socially Isolated (11/11/2023)  Tobacco Use: Medium Risk (01/30/2024)    Readmission Risk Interventions     No data to display

## 2024-02-03 DIAGNOSIS — R600 Localized edema: Secondary | ICD-10-CM | POA: Diagnosis not present

## 2024-02-03 DIAGNOSIS — N3000 Acute cystitis without hematuria: Secondary | ICD-10-CM | POA: Diagnosis not present

## 2024-02-03 DIAGNOSIS — R531 Weakness: Secondary | ICD-10-CM | POA: Diagnosis not present

## 2024-02-03 DIAGNOSIS — N179 Acute kidney failure, unspecified: Secondary | ICD-10-CM | POA: Diagnosis not present

## 2024-02-03 LAB — CBC
HCT: 34.2 % — ABNORMAL LOW (ref 36.0–46.0)
Hemoglobin: 10.9 g/dL — ABNORMAL LOW (ref 12.0–15.0)
MCH: 22.5 pg — ABNORMAL LOW (ref 26.0–34.0)
MCHC: 31.9 g/dL (ref 30.0–36.0)
MCV: 70.7 fL — ABNORMAL LOW (ref 80.0–100.0)
Platelets: 182 K/uL (ref 150–400)
RBC: 4.84 MIL/uL (ref 3.87–5.11)
RDW: 15.9 % — ABNORMAL HIGH (ref 11.5–15.5)
WBC: 6.8 K/uL (ref 4.0–10.5)
nRBC: 0 % (ref 0.0–0.2)

## 2024-02-03 LAB — COMPREHENSIVE METABOLIC PANEL WITH GFR
ALT: 13 U/L (ref 0–44)
AST: 19 U/L (ref 15–41)
Albumin: 3.4 g/dL — ABNORMAL LOW (ref 3.5–5.0)
Alkaline Phosphatase: 59 U/L (ref 38–126)
Anion gap: 7 (ref 5–15)
BUN: 26 mg/dL — ABNORMAL HIGH (ref 8–23)
CO2: 23 mmol/L (ref 22–32)
Calcium: 9.3 mg/dL (ref 8.9–10.3)
Chloride: 111 mmol/L (ref 98–111)
Creatinine, Ser: 1.77 mg/dL — ABNORMAL HIGH (ref 0.44–1.00)
GFR, Estimated: 27 mL/min — ABNORMAL LOW (ref >60)
Glucose, Bld: 102 mg/dL — ABNORMAL HIGH (ref 70–99)
Potassium: 4.1 mmol/L (ref 3.5–5.1)
Sodium: 141 mmol/L (ref 135–145)
Total Bilirubin: 0.7 mg/dL (ref 0.0–1.2)
Total Protein: 6.9 g/dL (ref 6.5–8.1)

## 2024-02-03 LAB — IRON AND TIBC
Iron: 40 ug/dL (ref 28–170)
Saturation Ratios: 9 % — ABNORMAL LOW (ref 10.4–31.8)
TIBC: 449 ug/dL (ref 250–450)
UIBC: 409 ug/dL

## 2024-02-03 LAB — FERRITIN: Ferritin: 13 ng/mL (ref 11–307)

## 2024-02-03 MED ORDER — FE FUM-VIT C-VIT B12-FA 460-60-0.01-1 MG PO CAPS
1.0000 | ORAL_CAPSULE | Freq: Two times a day (BID) | ORAL | Status: DC
  Administered 2024-02-03 – 2024-02-04 (×3): 1 via ORAL
  Filled 2024-02-03 (×3): qty 1

## 2024-02-03 NOTE — Progress Notes (Signed)
 Progress Note   Patient: Stephanie Anthony FMW:[id number] DOB: 1934/05/05 DOA: 01/30/2024     0 DOS: the patient was seen and examined on 02/03/2024   Brief hospital course: 89 y.o. female with medical history significant of bipolar disorder, hypothyroidism, CKD stage IV, HTN, HLD, GERD, recent UTI who presents with generalized weakness from home.  She was brought in approximately 1 week ago and found to have a UTI at that time.  She is taking all her meds as directed but has been unable to get up and move today.  She reports poor appetite and little desire to eat over the last few days.  Of note the patient has history of lower extremity edema that has required home wound care to bilateral lower extremities.  Additionally the patient was admitted to geropsych in June of this year.  A lot of the history was obtained from the son who stays with her approximately 4 days a week.  He is unsure if this is psychiatric or if something else is going on.  He called EMS because he was unable to get her out of bed today.  In the ED she was noted to have large hemoglobin protein and large leukocytes in the urine consistent with possible UTI.  She has a stable creatinine at 2.12.   9/14.  PT recommending rehab 9/15.  Cr 1.92.  Will stop fluids.  Repeat urinalysis did not reflex to culture. 9/16.  Passr pending, rehab pending 9/17: Hemodynamically stable, creatinine continued to improve and now at 1.77, hemoglobin at 10.9, iron saturation mildly low with ferritin of 13. Apparently cannot go to SNF as she did not had 3 nights of inpatient stay.  Ordered maximum home health services.  Assessment and Plan: * Acute kidney injury superimposed on CKD (HCC) Acute kidney injury on CKD stage IV.  Creatinine 2.12 on presentation and down to 1.77 with gentle fluids.  Looking back creatinine was 1.6 in June.  Patient would not want dialysis. - Monitor renal function -Avoid nephrotoxins  Suspected UTI Repeat urine  analysis did not reflex to culture secondary to a large amount of squamous cells.  Empirically was placed on Rocephin  and later switch over to Keflex . Urine culture with multiple species.  Bipolar disorder (HCC) Continue Lamictal  and lower the dose of Seroquel  to 50 mg at night with increased creatinine.  Seen by psychiatry and did not recommend any other medication changes.  Weakness Generalized weakness.  Physical therapy recommending rehab. Unfortunately patient did not qualify for inpatient 3 nights so will not be able to go to STR. Son is looking for LTC Maximum home health services ordered  Peripheral edema Bilateral lower extremity wraps  Vitamin B 12 deficiency Vitamin B12 on the lower side will give oral supplementation  Acute metabolic encephalopathy Seems improved.  Continue to monitor.   CT head was negative for any acute intracranial abnormality  Gastroesophageal reflux disease without esophagitis Continue PPI  HLD (hyperlipidemia) Will give a statin holiday with weakness  Hypertension Continue losartan   Hypothyroidism TSH 1.47.  Continue levothyroxine    Subjective: Patient was not feeling well stating that she is feeling weak.  Unable to elaborate further.  Physical Exam: Vitals:   02/02/24 2031 02/03/24 0414 02/03/24 0722 02/03/24 1559  BP: (!) 151/82 136/61 (!) 147/63 (!) 126/58  Pulse: 77 73 65 66  Resp: 19 18 17 17   Temp: 99 F (37.2 C) 98.8 F (37.1 C) 98.1 F (36.7 C) 98.3 F (36.8 C)  TempSrc: Oral  Oral  Oral  SpO2: 98% 96% 95% 96%  Weight:      Height:       General.  Frail and obese elderly lady, in no acute distress. Pulmonary.  Lungs clear bilaterally, normal respiratory effort. CV.  Regular rate and rhythm, no JVD, rub or murmur. Abdomen.  Soft, nontender, nondistended, BS positive. CNS.  Alert and oriented .  No focal neurologic deficit. Extremities.  Lower extremities wrapped Psychiatry.  Judgment and insight appears normal.     Data Reviewed: Prior data reviewed  Family Communication: Talked with son on phone.  Disposition: Status is: Observation Medically stable-PT was recommending SNF but patient was unable to go to STR as she did not qualify being inpatient.  This record has been created using Conservation officer, historic buildings. Errors have been sought and corrected,but may not always be located. Such creation errors do not reflect on the standard of care.   Time spent: 40 minutes  Author: Amaryllis Dare, MD 02/03/2024 4:16 PM  For on call review [email or web address].

## 2024-02-03 NOTE — Plan of Care (Signed)

## 2024-02-03 NOTE — Progress Notes (Signed)
 PT Cancellation Note  Patient Details Name: Elodia Haviland MRN: [id number] DOB: [birth year]   Cancelled Treatment:    Reason Eval/Treat Not Completed: Patient declined, no reason specified;Other (comment) (states she is not feeling good.) Will re-attempt at later date/time.    Kamren Heskett 02/03/2024, 2:46 PM

## 2024-02-03 NOTE — TOC Progression Note (Addendum)
 Transition of Care [facility]) - Progression Note    Patient Details  Name: Stephanie Anthony MRN: [id number] Date of Birth: January 14, 1934  Transition of Care [facility] CM/SW Contact  Dalia GORMAN Fuse, RN Phone Number: 02/03/2024, 12:31 PM  Clinical Narrative:     TOC spoke with the patient's son [name] and advised the patient doesn't have a 3 night inpt stay and wont qualify for STR. [name] explained he was hoping to use that time to bridge the patient to LTC. It is only him looking out and caring for his month. Up until a month ago she was able to live at home independently and would rely on him to run errands and to get to and from appointments. Over the past month her health has declined and he needs assistance to care for her. He has an appointment with an [employer] [employer] attorney to provide guidance on the spend down of her assets, but the appointment is not for another 2 weeks.   TOC provided the name and number for [facility] and advised him to call them and share that his mom is currently in the hospital and will need additional caregiver support once she is discharged. TOC also provided a list of SNF in [place] that he can review for LTC options.  1620: TOC reaached out to the patient's son to see if he spoke with [facility]. He advised that he called and lvmm, but hasn't received a call back. He advised that he spoke with the patient's PCP and elder law attorney and they both have a lot of request. The elderlaw attorney will get her placed but needs to understand the loc. The PCP would like to assess the patient to understand if she is appropriate for memory care. TOC explained the FL2 was completed and sent out was for STR. The patient's son became extremely agitated and advised he doesn't know what to do. TOC advised that we could setup home health services and he could work with [facility] to fill in the gaps. The patient's son had an incoming call and terminated the call with TOC.  Expected Discharge  Plan: Skilled Nursing Facility Barriers to Discharge: Continued Medical Work up               Expected Discharge Plan and Services     Post Acute Care Choice: Skilled Nursing Facility Living arrangements for the past 2 months: Single Family Home Expected Discharge Date: 02/02/24                                     Social Drivers of Health (SDOH) Interventions SDOH Screenings   Food Insecurity: No Food Insecurity (01/30/2024)  Housing: Low Risk  (01/30/2024)  Transportation Needs: No Transportation Needs (01/30/2024)  Utilities: Not At Risk (01/30/2024)  Alcohol Screen: Low Risk  (11/11/2023)  Depression (PHQ2-9): Low Risk  (06/01/2023)  Financial Resource Strain: Low Risk  (12/10/2023)   Received from [facility]  Social Connections: Unknown (01/30/2024)  Recent Concern: Social Connections - Socially Isolated (11/11/2023)  Tobacco Use: Medium Risk (01/30/2024)    Readmission Risk Interventions     No data to display

## 2024-02-03 NOTE — Progress Notes (Signed)
 OT Cancellation Note  Patient Details Name: Stephanie Anthony MRN: [id number] DOB: 02-Apr-1934   Cancelled Treatment:    Reason Eval/Treat Not Completed: Patient declined, no reason specified. Pt declining, not feeling well this afternoon. Will re-attempt next date.   Miosha Behe R., MPH, MS, OTR/L ascom [phone] 02/03/24, 3:36 PM

## 2024-02-04 DIAGNOSIS — N3000 Acute cystitis without hematuria: Secondary | ICD-10-CM | POA: Diagnosis not present

## 2024-02-04 DIAGNOSIS — R3989 Other symptoms and signs involving the genitourinary system: Secondary | ICD-10-CM | POA: Diagnosis not present

## 2024-02-04 DIAGNOSIS — N179 Acute kidney failure, unspecified: Secondary | ICD-10-CM | POA: Diagnosis not present

## 2024-02-04 DIAGNOSIS — R531 Weakness: Secondary | ICD-10-CM | POA: Diagnosis not present

## 2024-02-04 MED ORDER — FE FUM-VIT C-VIT B12-FA 460-60-0.01-1 MG PO CAPS: 1.0000 | ORAL_CAPSULE | Freq: Two times a day (BID) | ORAL | 0 refills | Status: AC

## 2024-02-04 MED ORDER — CYANOCOBALAMIN 500 MCG PO TABS: 500.0000 ug | ORAL_TABLET | Freq: Every day | ORAL | 1 refills | Status: AC

## 2024-02-04 MED ORDER — QUETIAPINE FUMARATE 50 MG PO TABS: 50.0000 mg | ORAL_TABLET | Freq: Every day | ORAL | 1 refills | Status: AC

## 2024-02-04 MED ORDER — POLYETHYLENE GLYCOL 3350 17 G PO PACK: 17.0000 g | PACK | Freq: Every day | ORAL | 0 refills | Status: AC | PRN

## 2024-02-04 NOTE — Plan of Care (Signed)

## 2024-02-04 NOTE — TOC Transition Note (Signed)
 Transition of Care [facility]) - Discharge Note   Patient Details  Name: Stephanie Anthony MRN: [id number] Date of Birth: 08-13-1933  Transition of Care [facility]) CM/SW Contact:  Dalia GORMAN Fuse, RN Phone Number: 02/04/2024, 12:08 PM   Clinical Narrative:     Patient is medically clear to discharge to home with home health services. Referral for [facility] PT/OT/RN/Aide/SW sent to [facility] accepted the referral. The patient's son will transport.  Final next level of care: Home w Home Health Services Barriers to Discharge: Barriers Resolved   Patient Goals and CMS Choice            Discharge Placement                       Discharge Plan and Services Additional resources added to the After Visit Summary for       Post Acute Care Choice: Skilled Nursing Facility                    HH Arranged: RN, PT, OT, Nurse's Aide, Social Work [employer] Agency: [employer] [facility] Home Health Services Date [facility] Agency Contacted: 02/04/24 Time HH Agency Contacted: 1208 Representative spoke with at [facility] Agency: [facility]  Social Drivers of Health (SDOH) Interventions SDOH Screenings   Food Insecurity: No Food Insecurity (01/30/2024)  Housing: Low Risk  (01/30/2024)  Transportation Needs: No Transportation Needs (01/30/2024)  Utilities: Not At Risk (01/30/2024)  Alcohol Screen: Low Risk  (11/11/2023)  Depression (PHQ2-9): Low Risk  (06/01/2023)  Financial Resource Strain: Low Risk  (12/10/2023)   Received from [facility]  Social Connections: Unknown (01/30/2024)  Recent Concern: Social Connections - Socially Isolated (11/11/2023)  Tobacco Use: Medium Risk (01/30/2024)     Readmission Risk Interventions     No data to display

## 2024-02-04 NOTE — Discharge Summary (Signed)
 Physician Discharge Summary   Patient: Stephanie Anthony MRN: [id number] DOB: 09-Jul-1933  Admit date:     01/30/2024  Discharge date: 02/04/24  Discharge Physician: Amaryllis Dare   PCP: Alla Amis, MD   Recommendations at discharge:  Please obtain CBC and BMP on follow-up Follow-up with primary care provider within a week  Discharge Diagnoses: Principal Problem:   Acute kidney injury superimposed on CKD [facility]) Active Problems:   Suspected UTI   Bipolar disorder (HCC)   Generalized weakness   Peripheral edema   Hypothyroidism   Hypertension   HLD (hyperlipidemia)   Gastroesophageal reflux disease without esophagitis   Acute metabolic encephalopathy   Vitamin B 12 deficiency   Acute cystitis without hematuria  Resolved Problems:   Chronic kidney disease (CKD), stage III (moderate) (HCC)   Familial multiple lipoprotein-type hyperlipidemia  Hospital Course: 89 y.o. female with medical history significant of bipolar disorder, hypothyroidism, CKD stage IV, HTN, HLD, GERD, recent UTI who presents with generalized weakness from home.  She was brought in approximately 1 week ago and found to have a UTI at that time.  She is taking all her meds as directed but has been unable to get up and move today.  She reports poor appetite and little desire to eat over the last few days.  Of note the patient has history of lower extremity edema that has required home wound care to bilateral lower extremities.  Additionally the patient was admitted to geropsych in June of this year.  A lot of the history was obtained from the son who stays with her approximately 4 days a week.  He is unsure if this is psychiatric or if something else is going on.  He called EMS because he was unable to get her out of bed today.  In the ED she was noted to have large hemoglobin protein and large leukocytes in the urine consistent with possible UTI.  She has a stable creatinine at 2.12.   9/14.  PT recommending  rehab 9/15.  Cr 1.92.  Will stop fluids.  Repeat urinalysis did not reflex to culture. 9/16.  Passr pending, rehab pending 9/17: Hemodynamically stable, creatinine continued to improve and now at 1.77, hemoglobin at 10.9, iron saturation mildly low with ferritin of 13. Apparently cannot go to SNF as she did not had 3 nights of inpatient stay.  Ordered maximum home health services.  9/18: Remained hemodynamically stable.  Unfortunately could not go to SNF because of her insurance requirements, maximum home health services were ordered, son is trying to get her to LTC which need to be done privately and as outpatient.  Patient will continue on current medications and need to have a close follow-up with her providers for further assistance.  Assessment and Plan: * Acute kidney injury superimposed on CKD (HCC) Acute kidney injury on CKD stage IV.  Creatinine 2.12 on presentation and down to 1.77 with gentle fluids.  Looking back creatinine was 1.6 in June.  Patient would not want dialysis. - Monitor renal function -Avoid nephrotoxins  Suspected UTI Repeat urine analysis did not reflex to culture secondary to a large amount of squamous cells.  Empirically was placed on Rocephin  and later switch over to Keflex -completed the course Urine culture with multiple species.  Bipolar disorder (HCC) Continue Lamictal  and lower the dose of Seroquel  to 50 mg at night with increased creatinine.  Seen by psychiatry and did not recommend any other medication changes.  Generalized weakness Generalized weakness.  Physical therapy  recommending rehab. Unfortunately patient did not qualify for inpatient 3 nights so will not be able to go to STR. Son is looking for LTC Maximum home health services ordered  Peripheral edema Bilateral lower extremity wraps  Vitamin B 12 deficiency Vitamin B12 on the lower side will give oral supplementation  Acute metabolic encephalopathy Seems improved.  Continue to  monitor.   CT head was negative for any acute intracranial abnormality  Gastroesophageal reflux disease without esophagitis Continue PPI  HLD (hyperlipidemia) Will give a statin holiday with weakness  Hypertension Continue losartan   Hypothyroidism TSH 1.47.  Continue levothyroxine   Consultants: None Procedures performed: None Disposition: Home health Diet recommendation:  Discharge Diet Orders (From admission, onward)     Start     Ordered   02/04/24 0000  Diet - low sodium heart healthy        02/04/24 1044           Cardiac diet DISCHARGE MEDICATION: Allergies as of 02/04/2024       Reactions   Aspirin  Other (See Comments)   Rectal Bleeding   Prednisone Other (See Comments)   Hallucinations   Codeine Nausea And Vomiting   Valium [diazepam] Other (See Comments)   Uknown        Medication List     STOP taking these medications    gabapentin  100 MG capsule Commonly known as: NEURONTIN        TAKE these medications    cyanocobalamin  500 MCG tablet Commonly known as: VITAMIN B12 Take 1 tablet (500 mcg total) by mouth daily. Start taking on: February 05, 2024   Fe Fum-Vit C-Vit B12-FA Caps capsule Commonly known as: TRIGELS-F FORTE Take 1 capsule by mouth 2 (two) times daily.   hydrocortisone  2.5 % rectal cream Commonly known as: ANUSOL -HC Place rectally 4 (four) times daily.   lamoTRIgine  100 MG tablet Commonly known as: LAMICTAL  Take 1 tablet (100 mg total) by mouth daily.   latanoprost  0.005 % ophthalmic solution Commonly known as: XALATAN  Place 1 drop into both eyes at bedtime.   levothyroxine  112 MCG tablet Commonly known as: SYNTHROID  Take 1 tablet (112 mcg total) by mouth daily at 6 (six) AM.   losartan  25 MG tablet Commonly known as: COZAAR  Take 25 mg by mouth daily.   lovastatin 40 MG tablet Commonly known as: MEVACOR Take 40 mg by mouth every evening.   multivitamin with minerals Tabs tablet Take 1 tablet by mouth  daily.   pantoprazole  40 MG tablet Commonly known as: PROTONIX  Take 40 mg by mouth daily.   polyethylene glycol 17 g packet Commonly known as: MIRALAX  / GLYCOLAX  Take 17 g by mouth daily as needed for mild constipation.   QUEtiapine  50 MG tablet Commonly known as: SEROQUEL  Take 1 tablet (50 mg total) by mouth at bedtime. What changed:  medication strength how much to take additional instructions Another medication with the same name was removed. Continue taking this medication, and follow the directions you see here.               Discharge Care Instructions  (From admission, onward)           Start     Ordered   02/04/24 0000  Discharge wound care:       Comments: please change BLE dressings Q day as follows: Cleanse B lower extremity wounds with NS, dry and apply Xeroform gauze ([place] [phone]) to wound beds daily, cover with ABD pads and secure with Kerlix roll gauze  beginning right above toes and ending right below knees.  Apply Ace bandage wrapped in same fashion as Kerlix for light compression.   02/04/24 1044            Follow-up Information     Alla Amis, MD Follow up.   Specialty: Family Medicine Why: hospital follow up Contact information: [address] [facility] [place] [address] [phone]                Discharge Exam: Stephanie Anthony   01/30/24 0918  Weight: 91.6 kg   General.  Frail elderly lady, in no acute distress. Pulmonary.  Lungs clear bilaterally, normal respiratory effort. CV.  Regular rate and rhythm, no JVD, rub or murmur. Abdomen.  Soft, nontender, nondistended, BS positive. CNS.  Alert and oriented .  No focal neurologic deficit. Extremities.  Bilateral lower extremities wrapped Psychiatry.  Judgment and insight appears normal.   Condition at discharge: stable  The results of significant diagnostics from this hospitalization (including imaging, microbiology, ancillary and laboratory)  are listed below for reference.   Imaging Studies: CT HEAD WO CONTRAST ( ) Result Date: 02/02/2024 CLINICAL DATA:  Mental status change, unknown cause EXAM: CT HEAD WITHOUT CONTRAST TECHNIQUE: Contiguous axial images were obtained from the base of the skull through the vertex without intravenous contrast. RADIATION DOSE REDUCTION: This exam was performed according to the departmental dose-optimization program which includes automated exposure control, adjustment of the mA and/or kV according to patient size and/or use of iterative reconstruction technique. COMPARISON:  CT head October 27, 2023. FINDINGS: Brain: No evidence of acute infarction, hemorrhage, hydrocephalus, extra-axial collection or mass lesion/mass effect. Vascular: No hyperdense vessel. Skull: No acute fracture. Sinuses/Orbits: Clear sinuses.  No acute orbital findings. Other: No mastoid effusions. IMPRESSION: No evidence of acute intracranial abnormality. Electronically Signed   By: Gilmore GORMAN Molt M.D.   On: 02/02/2024 17:42   DG Chest 2 View Result Date: 01/17/2024 CLINICAL DATA:  SOB, weakness EXAM: CHEST - 2 VIEW COMPARISON:  Chest x-ray 11/01/2023 FINDINGS: The heart and mediastinal contours are unchanged. Atherosclerotic plaque. No focal consolidation. No pulmonary edema. Blunting of the right costophrenic angle with possible trace pleural effusion. No pneumothorax. No acute osseous abnormality. IMPRESSION: Likely trace right pleural effusion. Electronically Signed   By: Morgane  Naveau M.D.   On: 01/17/2024 16:28    Microbiology: Results for orders placed or performed during the hospital encounter of 01/30/24  Urine Culture     Status: Abnormal   Collection Time: 01/30/24 12:17 PM   Specimen: Urine, Clean Catch  Result Value Ref Range Status   Specimen Description   Final    URINE, CLEAN CATCH Performed at [facility], [address] [place], [place] [address]    Special Requests   Final    NONE Performed at  [facility], [address] [place], [place] [address]    Culture MULTIPLE SPECIES PRESENT, SUGGEST RECOLLECTION (A)  Final   Report Status 01/31/2024 FINAL  Final    Labs: CBC: Recent Labs  Lab 01/30/24 0917 01/30/24 1624 01/31/24 0546 02/03/24 0603  WBC 8.2  --  6.0 6.8  HGB 11.4*  --  10.2* 10.9*  HCT 35.7* 36.0 32.7* 34.2*  MCV 70.8*  --  70.9* 70.7*  PLT 219  --  178 182   Basic Metabolic Panel: Recent Labs  Lab 01/30/24 0917 01/31/24 0546 02/01/24 0719 02/03/24 0603  NA 143 141 145 141  K 4.4 4.2 4.2 4.1  CL 105 112* 109 111  CO2 20* 21* 21* 23  GLUCOSE 114* 106* 102* 102*  BUN 31* 34* 33* 26*  CREATININE 2.12* 1.94* 1.92* 1.77*  CALCIUM 9.2 8.7* 9.1 9.3   Liver Function Tests: Recent Labs  Lab 01/30/24 0917 01/31/24 0546 02/03/24 0603  AST 23 17 19   ALT 12 10 13   ALKPHOS 64 57 59  BILITOT 0.7 0.6 0.7  PROT 7.0 6.4* 6.9  ALBUMIN 3.4* 3.2* 3.4*   CBG: Recent Labs  Lab 01/30/24 0925  GLUCAP 109*    Discharge time spent: greater than 30 minutes.  This record has been created using Conservation officer, historic buildings. Errors have been sought and corrected,but may not always be located. Such creation errors do not reflect on the standard of care.   Signed: Amaryllis Dare, MD [facility] 02/04/2024
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
## 2024-02-08 ENCOUNTER — Ambulatory Visit: Admitting: Psychiatry

## 2024-03-03 ENCOUNTER — Telehealth: Payer: Self-pay | Admitting: Psychiatry

## 2024-03-03 NOTE — Telephone Encounter (Signed)
 Called patient to reschedule the missed appointment from Sept due to hospitalization. Patient stated that she is currently at [facility] and will be there a while. Advised to call when she is able to return to the office.
